# Patient Record
Sex: Female | Born: 1935 | Race: White | Hispanic: No | Smoking: Former smoker
Health system: Southern US, Community
[De-identification: ages and names within clinical notes are randomized; demographics above are authoritative.]

## PROBLEM LIST (undated history)

## (undated) DIAGNOSIS — J309 Allergic rhinitis, unspecified: Secondary | ICD-10-CM

## (undated) DIAGNOSIS — R232 Flushing: Secondary | ICD-10-CM

## (undated) DIAGNOSIS — F411 Generalized anxiety disorder: Secondary | ICD-10-CM

## (undated) DIAGNOSIS — L259 Unspecified contact dermatitis, unspecified cause: Secondary | ICD-10-CM

## (undated) DIAGNOSIS — I10 Essential (primary) hypertension: Secondary | ICD-10-CM

## (undated) DIAGNOSIS — K644 Residual hemorrhoidal skin tags: Secondary | ICD-10-CM

## (undated) DIAGNOSIS — G20 Parkinson's disease: Secondary | ICD-10-CM

## (undated) DIAGNOSIS — G20A1 Parkinson's disease without dyskinesia, without mention of fluctuations: Secondary | ICD-10-CM

## (undated) DIAGNOSIS — R002 Palpitations: Secondary | ICD-10-CM

## (undated) DIAGNOSIS — E785 Hyperlipidemia, unspecified: Secondary | ICD-10-CM

## (undated) DIAGNOSIS — F329 Major depressive disorder, single episode, unspecified: Secondary | ICD-10-CM

## (undated) DIAGNOSIS — K589 Irritable bowel syndrome without diarrhea: Secondary | ICD-10-CM

## (undated) HISTORY — DX: Residual hemorrhoidal skin tags: K64.4

## (undated) HISTORY — DX: Parkinson's disease: G20

## (undated) HISTORY — DX: Parkinson's disease without dyskinesia, without mention of fluctuations: G20.A1

## (undated) HISTORY — DX: Major depressive disorder, single episode, unspecified: F32.9

## (undated) HISTORY — DX: Generalized anxiety disorder: F41.1

## (undated) HISTORY — PX: CATARACT EXTRACTION W/ INTRAOCULAR LENS IMPLANT: SHX1309

## (undated) HISTORY — PX: OTHER SURGICAL HISTORY: SHX169

## (undated) HISTORY — DX: Palpitations: R00.2

## (undated) HISTORY — DX: Irritable bowel syndrome, unspecified: K58.9

## (undated) HISTORY — DX: Hyperlipidemia, unspecified: E78.5

## (undated) HISTORY — DX: Allergic rhinitis, unspecified: J30.9

## (undated) HISTORY — DX: Unspecified contact dermatitis, unspecified cause: L25.9

## (undated) HISTORY — DX: Flushing: R23.2

## (undated) HISTORY — PX: HEMORRHOID SURGERY: SHX153

## (undated) HISTORY — PX: MANDIBLE SURGERY: SHX707

---

## 1998-09-28 ENCOUNTER — Other Ambulatory Visit: Admission: RE | Admit: 1998-09-28 | Discharge: 1998-09-28 | Payer: Self-pay | Admitting: Obstetrics and Gynecology

## 1999-10-04 ENCOUNTER — Other Ambulatory Visit: Admission: RE | Admit: 1999-10-04 | Discharge: 1999-10-04 | Payer: Self-pay | Admitting: Obstetrics and Gynecology

## 2000-08-04 ENCOUNTER — Observation Stay (HOSPITAL_COMMUNITY): Admission: RE | Admit: 2000-08-04 | Discharge: 2000-08-06 | Payer: Self-pay | Admitting: Oral Surgery

## 2000-08-12 ENCOUNTER — Emergency Department (HOSPITAL_COMMUNITY): Admission: EM | Admit: 2000-08-12 | Discharge: 2000-08-12 | Payer: Self-pay | Admitting: Emergency Medicine

## 2000-08-12 ENCOUNTER — Encounter: Payer: Self-pay | Admitting: Emergency Medicine

## 2000-12-07 ENCOUNTER — Other Ambulatory Visit: Admission: RE | Admit: 2000-12-07 | Discharge: 2000-12-07 | Payer: Self-pay | Admitting: Obstetrics and Gynecology

## 2001-12-20 ENCOUNTER — Other Ambulatory Visit: Admission: RE | Admit: 2001-12-20 | Discharge: 2001-12-20 | Payer: Self-pay | Admitting: Obstetrics and Gynecology

## 2005-05-18 ENCOUNTER — Ambulatory Visit: Payer: Self-pay | Admitting: Internal Medicine

## 2005-05-24 ENCOUNTER — Ambulatory Visit: Payer: Self-pay | Admitting: Internal Medicine

## 2005-07-11 ENCOUNTER — Ambulatory Visit: Payer: Self-pay | Admitting: Internal Medicine

## 2005-07-25 ENCOUNTER — Ambulatory Visit: Payer: Self-pay | Admitting: Internal Medicine

## 2005-08-17 ENCOUNTER — Ambulatory Visit: Payer: Self-pay | Admitting: Endocrinology

## 2006-02-28 ENCOUNTER — Other Ambulatory Visit: Admission: RE | Admit: 2006-02-28 | Discharge: 2006-02-28 | Payer: Self-pay | Admitting: Obstetrics and Gynecology

## 2006-05-09 ENCOUNTER — Ambulatory Visit: Payer: Self-pay | Admitting: Internal Medicine

## 2008-02-07 DIAGNOSIS — J309 Allergic rhinitis, unspecified: Secondary | ICD-10-CM

## 2008-02-07 DIAGNOSIS — E785 Hyperlipidemia, unspecified: Secondary | ICD-10-CM

## 2008-02-07 HISTORY — DX: Allergic rhinitis, unspecified: J30.9

## 2008-02-07 HISTORY — DX: Hyperlipidemia, unspecified: E78.5

## 2008-04-28 ENCOUNTER — Ambulatory Visit: Payer: Self-pay | Admitting: Internal Medicine

## 2008-07-02 ENCOUNTER — Ambulatory Visit: Payer: Self-pay | Admitting: Endocrinology

## 2009-06-02 ENCOUNTER — Telehealth (INDEPENDENT_AMBULATORY_CARE_PROVIDER_SITE_OTHER): Payer: Self-pay | Admitting: *Deleted

## 2009-07-14 ENCOUNTER — Ambulatory Visit: Payer: Self-pay | Admitting: Internal Medicine

## 2009-07-28 ENCOUNTER — Ambulatory Visit: Payer: Self-pay | Admitting: Internal Medicine

## 2009-07-28 LAB — HM COLONOSCOPY

## 2010-05-11 LAB — CONVERTED CEMR LAB: Pap Smear: NORMAL

## 2010-05-25 ENCOUNTER — Ambulatory Visit: Payer: Self-pay | Admitting: Internal Medicine

## 2010-05-25 LAB — CONVERTED CEMR LAB
ALT: 14 units/L (ref 0–35)
AST: 18 units/L (ref 0–37)
Albumin: 4.2 g/dL (ref 3.5–5.2)
Alkaline Phosphatase: 55 units/L (ref 39–117)
BUN: 14 mg/dL (ref 6–23)
Basophils Absolute: 0.1 10*3/uL (ref 0.0–0.1)
Basophils Relative: 0.9 % (ref 0.0–3.0)
Bilirubin, Direct: 0.1 mg/dL (ref 0.0–0.3)
CO2: 28 meq/L (ref 19–32)
Calcium: 9.4 mg/dL (ref 8.4–10.5)
Chloride: 109 meq/L (ref 96–112)
Cholesterol: 291 mg/dL — ABNORMAL HIGH (ref 0–200)
Creatinine, Ser: 0.8 mg/dL (ref 0.4–1.2)
Direct LDL: 210.1 mg/dL
Eosinophils Absolute: 0.1 10*3/uL (ref 0.0–0.7)
Eosinophils Relative: 1.1 % (ref 0.0–5.0)
GFR calc non Af Amer: 72.76 mL/min (ref >60)
Glucose, Bld: 96 mg/dL (ref 70–99)
HCT: 41.1 % (ref 36.0–46.0)
HDL: 66.6 mg/dL (ref >39.00)
Hemoglobin: 14.3 g/dL (ref 12.0–15.0)
Lymphocytes Relative: 25.1 % (ref 12.0–46.0)
Lymphs Abs: 1.7 10*3/uL (ref 0.7–4.0)
MCHC: 34.9 g/dL (ref 30.0–36.0)
MCV: 90.2 fL (ref 78.0–100.0)
Monocytes Absolute: 0.5 10*3/uL (ref 0.1–1.0)
Monocytes Relative: 7.5 % (ref 3.0–12.0)
Neutro Abs: 4.4 10*3/uL (ref 1.4–7.7)
Neutrophils Relative %: 65.4 % (ref 43.0–77.0)
Platelets: 226 10*3/uL (ref 150.0–400.0)
Potassium: 4.7 meq/L (ref 3.5–5.1)
RBC: 4.55 M/uL (ref 3.87–5.11)
RDW: 13.2 % (ref 11.5–14.6)
Sodium: 143 meq/L (ref 135–145)
TSH: 1.89 microintl units/mL (ref 0.35–5.50)
Total Bilirubin: 1 mg/dL (ref 0.3–1.2)
Total CHOL/HDL Ratio: 4
Total Protein: 6.6 g/dL (ref 6.0–8.3)
Triglycerides: 107 mg/dL (ref 0.0–149.0)
VLDL: 21.4 mg/dL (ref 0.0–40.0)
WBC: 6.7 10*3/uL (ref 4.5–10.5)

## 2010-05-28 ENCOUNTER — Encounter: Payer: Self-pay | Admitting: Internal Medicine

## 2010-06-30 ENCOUNTER — Telehealth: Payer: Self-pay | Admitting: Internal Medicine

## 2010-08-12 ENCOUNTER — Ambulatory Visit: Payer: Self-pay | Admitting: Internal Medicine

## 2010-08-17 ENCOUNTER — Telehealth: Payer: Self-pay | Admitting: Internal Medicine

## 2010-09-21 ENCOUNTER — Ambulatory Visit: Payer: Self-pay | Admitting: Internal Medicine

## 2010-09-21 LAB — CONVERTED CEMR LAB
ALT: 13 units/L (ref 0–35)
AST: 17 units/L (ref 0–37)
Albumin: 3.8 g/dL (ref 3.5–5.2)
Alkaline Phosphatase: 66 units/L (ref 39–117)
Bilirubin, Direct: 0.1 mg/dL (ref 0.0–0.3)
Cholesterol: 173 mg/dL (ref 0–200)
HDL: 51.5 mg/dL (ref >39.00)
LDL Cholesterol: 94 mg/dL (ref 0–99)
Total Bilirubin: 0.7 mg/dL (ref 0.3–1.2)
Total CHOL/HDL Ratio: 3
Total Protein: 6.6 g/dL (ref 6.0–8.3)
Triglycerides: 137 mg/dL (ref 0.0–149.0)
VLDL: 27.4 mg/dL (ref 0.0–40.0)

## 2010-10-06 ENCOUNTER — Encounter: Payer: Self-pay | Admitting: Internal Medicine

## 2011-01-13 NOTE — Letter (Signed)
[facility] [address] [place], [place]  [address] Phone: [phone]      October 07, 2010   [name] [address] [place], [place] [address]  RE:  LAB RESULTS  Dear  [name],  The following is an interpretation of your most recent lab tests.  Please take note of any instructions provided or changes to medications that have resulted from your lab work.  LIVER FUNCTION TESTS:  Good - no changes needed  LIPID PANEL:  Good - no changes needed Triglyceride: 137.0   Cholesterol: 173   LDL: 94   HDL: 51.50   Chol/HDL%:  3   Fabulous response to low dose statin therapy. Good job.   Please come see me if you have any questions about these lab results.   Sincerely Yours,    Jacques Navy MD  atient: [name] Note: All result statuses are Final unless otherwise noted.  Tests: (1) Lipid Panel (LIPID)   Cholesterol               173 mg/dL                   3-875     ATP III Classification            Desirable:  < 200 mg/dL                    Borderline High:  200 - 239 mg/dL               High:  > = 240 mg/dL   Triglycerides             137.0 mg/dL                 6.4-332.9     Normal:  <150 mg/dL     Borderline High:  518 - 199 mg/dL   HDL                       84.16 mg/dL                 >60.63   VLDL Cholesterol          27.4 mg/dL                  0.1-60.1   LDL Cholesterol           94 mg/dL                    0-93  CHO/HDL Ratio:  CHD Risk                             3                    Men          Women     1/2 Average Risk     3.4          3.3     Average Risk          5.0          4.4     2X Average Risk          9.6          7.1     3X Average Risk          15.0  11.0                           Tests: (2) Hepatic/Liver Function Panel (HEPATIC)   Total Bilirubin           0.7 mg/dL                   0.7-3.7   Direct Bilirubin          0.1 mg/dL                   1.0-6.2   Alkaline Phosphatase      66 U/L                       39-117   AST                       17 U/L                      0-37   ALT                       13 U/L                      0-35   Total Protein             6.6 g/dL                    6.9-4.8   Albumin                   3.8 g/dL                    5.4-6.2

## 2011-01-13 NOTE — Progress Notes (Signed)
  Phone Note Refill Request Message from:  Fax from Pharmacy on June 30, 2010 9:55 AM  Refills Requested: Medication #1:  RHINOCORT AQUA 32 MCG/ACT  SUSP Take as directed. Recie[facility] [address]. Pt needs a cheaper alternative. Please Advise  Initial call taken by: Ami Bullins CMA,  June 30, 2010 9:55 AM  Follow-up for Phone Call        change to fluticasone nasal spray- 1 spray per nostril once a day. #1 bottle, refill as needed. Follow-up by: Jacques Navy MD,  June 30, 2010 12:20 PM    New/Updated Medications: FLUTICASONE PROPIONATE 50 MCG/ACT SUSP (FLUTICASONE PROPIONATE) 1 spray per nostril once a day Prescriptions: FLUTICASONE PROPIONATE 50 MCG/ACT SUSP (FLUTICASONE PROPIONATE) 1 spray per nostril once a day  #1 x 1   Entered by:   Ami Bullins CMA   Authorized by:   Jacques Navy MD   Signed by:   Bill Salinas CMA on 06/30/2010   Method used:   [facility]        [facility]  [phone]* (retail)       [address] [place], [place]  [address]       Ph: [phone] or [phone]       Fax: [phone]   RxID:   [id number]

## 2011-01-13 NOTE — Progress Notes (Signed)
Summary: LIPITOR REACTION?  Phone Note Call from Patient   Summary of Call: Paitient had itching after 2 days of lipitor. Pt woke up and c/o red itchy area on left arm and wrist. She stopped med, took claritin and benadryl. Symptoms resolved by Sunday. She wants to know what her alternatives are and will no longer take this medication.  Initial call taken by: Sarah Douglas, CMA,  August 17, 2010 12:31 PM  Follow-up for Phone Call        ok to change to simvastatin 20 mg per day Follow-up by: James W John MD,  August 17, 2010 1:05 PM  Additional Follow-up for Phone Call Additional follow up Details #1::        Pt informed Additional Follow-up by: Dahlia Bonyun-DeBourgh, CMA,  August 17, 2010 1:37 PM   New Allergies: ! LIPITOR New/Updated Medications: SIMVASTATIN 20 MG TABS (SIMVASTATIN) 1po once daily New Allergies: ! LIPITORPrescriptions: SIMVASTATIN 20 MG TABS (SIMVASTATIN) 1po once daily  #90 x 3   Entered and Authorized by:   James W John MD   Signed by:   James W John MD on 08/17/2010   Method used:   [facility]        [facility]  [address]  [id number] ([facility]ail)       [address]       [place], [place]  [address]       Ph: [phone] or [phone]       Fax: [phone]   RxID:   [id number]

## 2011-01-13 NOTE — Assessment & Plan Note (Signed)
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  History of Present Illness: Patient presents for routine medical exam. she has recently GYN exam and this was normal. She had a mammogram recently. She had colonoscopy '10 with Dr. Leone Payor - normal study. Independent in all ADLs with no restrictions or limitations. No history of falls and no balance trouble. No problems with depression or mood disorder. 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   Current Medications (verified): 1)  Prometrium 100 Mg  Caps (Progesterone Micronized) .... Take One Capusle Daily 2)  Estrace 1 Mg  Tabs (Estradiol) .... Take One Tablet Daily 3)  Citrucel 500 Mg  Tabs (Methylcellulose (Laxative)) .... Take One Tablet Daily 4)  Rhinocort Aqua 32 Mcg/act  Susp (Budesonide) .... Take As Directed. 5)  Triamcinolone Acetonide 0.5 %  Crea (Triamcinolone Acetonide) .... Use Asd Two Times A Day Prn 6)  Epipen 0.3 Mg/0.85ml Devi (Epinephrine) .... As Needed For Bee Stings  Allergies (verified): 1)  ! Asa 2)  ! Tylenol 3)  ! Ibuprofen 4)  ! * Vagisec Cream 5)  ! Keflex 6)  ! * Hymenoptra  Past History:  Past Medical History: Last updated: 02/07/2008 Hx of HEMORRHOIDS, EXTERNAL (ICD-455.3) SEBORRHEIC KERATOSES, HX OF (ICD-V13.3) HYPERLIPIDEMIA (ICD-272.4) FIBROCYSTIC BREAST DISEASE, HX OF  (ICD-V13.9) Hx of HORMONE REPLACEMENT THERAPY 10 YEARS (ICD-V07.4) ALLERGIC RHINITIS (ICD-477.9) IRRITABLE BOWEL SYNDROME (ICD-564.1)  Past Surgical History: Last updated: 02/07/2008 * BENIGN MOLES REMOVED Hx of SEBACEOUS CYST EXCISED (ICD-706.2) HEMORRHOIDECTOMY, HX OF (ICD-V45.89)    Family History: Last updated: 04/28/2008 heart disease - father, grandfather and uncle and aunt mother lived to 18 yo 6 sisters - 2 with breast cancer, and hypothyroid  Social History: Last updated: 04/28/2008 Married 4 daughters Former Smoker Alcohol use-yes retired - Doctor, 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  The patient denies anorexia, fever, weight loss, weight gain, decreased hearing, chest pain, syncope, dyspnea on exertion, peripheral edema, prolonged cough, abdominal pain, severe indigestion/heartburn, and incontinence.         visual change due to cataract- significant loss of vision OS. Possible lactose intolerance. Hymenoptra   Physical Exam  General:  WNWD white female in no distress. Head:  Normocephalic and atraumatic without obvious abnormalities. No apparent alopecia or balding. Eyes:  vision grossly intact, pupils equal, pupils round, corneas and lenses clear, and no injection.   Ears:  External ear exam shows no significant lesions or deformities.  Otoscopic examination reveals clear canals, tympanic membranes are intact bilaterally  without bulging, retraction, inflammation or discharge. Hearing is grossly normal bilaterally. Nose:  no external deformity and no external erythema.   Mouth:  Oral mucosa and oropharynx without lesions or exudates.  Teeth in good repair. Neck:  supple, no thyromegaly, and no carotid bruits.   Chest Wall:  no deformities.   Breasts:  deferred to gyn Lungs:  Normal respiratory effort, chest expands symmetrically. Lungs are clear to auscultation, no crackles or wheezes. Heart:  Normal rate and  regular rhythm. S1 and S2 normal without gallop, murmur, click, rub or other extra sounds. Abdomen:  soft, non-tender, normal bowel sounds, no guarding, and no hepatomegaly.   Genitalia:  deferred to gyn Msk:  normal ROM, no joint tenderness, no joint swelling, no joint warmth, no joint deformities, and no crepitation.   Pulses:  2+ radial and DP pulses Extremities:  No clubbing, cyanosis, edema, or deformity noted with normal full range of motion of all joints.   Neurologic:  alert & oriented X3, cranial nerves II-XII intact, strength normal in all extremities, gait normal, and DTRs symmetrical and normal.   Skin:  turgor normal, color normal, and no rashes.   Cervical Nodes:  no anterior cervical adenopathy and no posterior cervical adenopathy.   Psych:  Oriented X3, memory intact for recent and remote, normally interactive, good eye contact, and not anxious appearing.     Impression & Recommendations:  Problem # 1:  HYPERLIPIDEMIA (ICD-272.4) Due for lab with recommendations to follow.  Orders: TLB-Lipid Panel (80061-LIPID) TLB-Hepatic/Liver Function Pnl (80076-HEPATIC) TLB-TSH (Thyroid Stimulating Hormone) (84443-TSH)  Addendum - cholesterol very high with LDL 201!  Plan - will recommend medical therapy - OV to discuss options.   Problem # 2:  ALLERGIC RHINITIS (ICD-477.9) Stable. She gets good results with rhinocort. Rx provided.  Her updated medication list for this problem includes:    Rhinocort Aqua 32 Mcg/act Susp (Budesonide) .[name]... Take as directed.  Problem # 3:  IRRITABLE BOWEL SYNDROME (ICD-564.1) Patient does not complain of significant symptoms. She follows a healthy diet with plenty of fiber.  Plan - continue diet management.  Orders: TLB-BMP (Basic Metabolic Panel-BMET) (80048-METABOL)  Problem # 4:  Preventive Health Care (ICD-V70.0) Unremarkable history. Normal physical exam. Lab results are normal except for cholesterol panel. She is current with gyn and  exams. Last mammogram '11. Last colonoscopy Aug '10. Her tetnus is current, '08. She is a candidate for pnemonia and shingles vaccines.  Patient does exercise but could do more. She has no h/o falls or fall risk. 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  In summary - a very nice woman who is medically stable except for hyperlipidemia. She will be asked to return to discuss medical therapy and to have the opportunity for immuniztions.   Complete Medication List: 1)  Prometrium 100 Mg Caps (Progesterone micronized) .... Take one capusle daily 2)  Estrace 1 Mg Tabs (Estradiol) .... Take one tablet daily 3)  Citrucel 500 Mg Tabs (Methylcellulose (laxative)) .... Take one tablet daily 4)  Rhinocort Aqua 32 Mcg/act Susp (Budesonide) .... Take as directed. 5)  Triamcinolone Acetonide 0.5 % Crea (Triamcinolone acetonide) .... Use asd two times a day prn 6)  Epipen 0.3 Mg/0.29ml Devi (Epinephrine) .... As needed for bee stings  Other Orders: Subsequent annual wellness visit with prevention plan (Z6109) TLB-CBC Platelet - w/Differential (85025-CBCD)  ES Runde Note: All result statuses are Final unless otherwise noted.  Tests: (1) Lipid  Panel (LIPID)   Cholesterol          [H]  291 mg/dL                   1-610     ATP III Classification            Desirable:  < 200 mg/dL                    Borderline High:  200 - 239 mg/dL               High:  > = 240 mg/dL   Triglycerides             107.0 mg/dL                 9.6-045.4     Normal:  <150 mg/dL     Borderline High:  098 - 199 mg/dL   HDL                       11.91 mg/dL                 >47.82   VLDL Cholesterol          21.4 mg/dL                  9.5-62.1  CHO/HDL Ratio:  CHD Risk                             4                    Men          Women     1/2 Average Risk     3.4          3.3     Average Risk          5.0          4.4     2X Average Risk          9.6           7.1     3X Average Risk          15.0          11.0                           Tests: (2) Hepatic/Liver Function Panel (HEPATIC)   Total Bilirubin           1.0 mg/dL                   3.0-8.6   Direct Bilirubin          0.1 mg/dL                   5.7-8.4   Alkaline Phosphatase      55 U/L                      39-117   AST                       18 U/L                      0-37   ALT  14 U/L                      0-35   Total Protein             6.6 g/dL                    8.6-5.7   Albumin                   4.2 g/dL                    8.4-6.9  Tests: (3) CBC Platelet w/Diff (CBCD)   White Cell Count          6.7 K/uL                    4.5-10.5   Red Cell Count            4.55 Mil/uL                 3.87-5.11   Hemoglobin                14.3 g/dL                   62.9-52.8   Hematocrit                41.1 %                      36.0-46.0   MCV                       90.2 fl                     78.0-100.0   MCHC                      34.9 g/dL                   41.3-24.4   RDW                       13.2 %                      11.5-14.6   Platelet Count            226.0 K/uL                  150.0-400.0   Neutrophil %              65.4 %                      43.0-77.0   Lymphocyte %              25.1 %                      12.0-46.0   Monocyte %                7.5 %                       3.0-12.0   Eosinophils%              1.1 %  0.0-5.0   Basophils %               0.9 %                       0.0-3.0   Neutrophill Absolute      4.4 K/uL                    1.4-7.7   Lymphocyte Absolute       1.7 K/uL                    0.7-4.0   Monocyte Absolute         0.5 K/uL                    0.1-1.0  Eosinophils, Absolute                             0.1 K/uL                    0.0-0.7   Basophils Absolute        0.1 K/uL                    0.0-0.1  Tests: (4) BMP (METABOL)   Sodium                    143 mEq/L                   135-145    Potassium                 4.7 mEq/L                   3.5-5.1   Chloride                  109 mEq/L                   96-112   Carbon Dioxide            28 mEq/L                    19-32   Glucose                   96 mg/dL                    16-10   BUN                       14 mg/dL                    9-60   Creatinine                0.8 mg/dL                   4.5-4.0   Calcium                   9.4 mg/dL                   9.8-11.9   GFR                       72.76 mL/min                >  60  Tests: (5) TSH (TSH)   FastTSH                   1.89 uIU/mL                 0.35-5.50  Tests: (6) Cholesterol LDL - Direct (DIRLDL)  Cholesterol LDL - Direct                             210.1 mg/dL     Optimal:  <161 mg/dL     Near or Above Optimal:  100-129 mg/dL     Borderline High:  096-045 mg/dL     High:  409-811 mg/dL     Very High:  >914 mg/dLPrescriptions: RHINOCORT AQUA 32 MCG/ACT  SUSP (BUDESONIDE) Take as directed.  #1 x 12   Entered and Authorized by:   Jacques Navy MD   Signed by:   Jacques Navy MD on 05/25/2010   Method used:   [facility]        [facility]  [phone]* (retail)       [address] [place], [place]  [address]       Ph: [phone] or [phone]       Fax: [phone]   RxID:   [id number]    Preventive Care Screening  Mammogram:    Date:  05/11/2010    Results:  normal bilateral   Pap Smear:    Date:  05/11/2010    Results:  normal   Last Flu Shot:    Date:  09/12/2009    Results:  given   Last Tetanus Booster:    Date:  05/14/2007    Results:  Historical

## 2011-01-13 NOTE — Assessment & Plan Note (Signed)
Summary: f/u appt/#/cd   Vital Signs:  Patient profile:   75 year old female Height:      63 inches Weight:      149 pounds BMI:     26.49 O2 Sat:      98 % on Room air Temp:     97.6 degrees F oral Pulse rate:   73 / minute BP sitting:   132 / 78  (left arm) Cuff size:   regular  Vitals Entered By: Bill Salinas CMA (August 12, 2010 10:26 AM)  O2 Flow:  Room air CC: ov for follow up on elevated cholesterol/ ab   Primary Care Provider:  Norins  CC:  ov for follow up on elevated cholesterol/ ab.  History of Present Illness: Patient presents to review recent abnormal lab-elevated lipid panel. She has had no problems since her recent CPX  Current Medications (verified): 1)  Prometrium 100 Mg  Caps (Progesterone Micronized) .... Take One Capusle Daily 2)  Estrace 1 Mg  Tabs (Estradiol) .... Take One Tablet Daily 3)  Fluticasone Propionate 50 Mcg/act Susp (Fluticasone Propionate) .[name].. 1 Spray Per Nostril Once A Day 4)  Epipen 0.3 Mg/0.73ml Devi (Epinephrine) .... As Needed For Bee Stings  Allergies (verified): 1)  ! Asa 2)  ! Tylenol 3)  ! Ibuprofen 4)  ! * Vagisec Cream 5)  ! Keflex 6)  ! * Hymenoptra PMH-FH-SH reviewed-no changes except otherwise noted  Review of Systems  The patient denies anorexia, fever, chest pain, dyspnea on exertion, abdominal pain, unusual weight change, and enlarged lymph nodes.    Physical Exam  General:  Well-developed,well-nourished,in no acute distress; alert,appropriate and cooperative throughout examination Lungs:  normal respiratory effort.   Heart:  normal rate and regular rhythm.   Neurologic:  alert & oriented X3 and gait normal.   Skin:  turgor normal and color normal.   Psych:  Oriented X3, memory intact for recent and remote, good eye contact, and not anxious appearing.     Impression & Recommendations:  Problem # 1:  HYPERLIPIDEMIA (ICD-272.4) Patient with abnormal lipid panel with LDL  at CPX in July of 201. This is much  higher than last lab. Discussed implications and need for control including  diet managment and medical management. discussed the mechanism of action of Statin drugs.  Plan - lipitor 10 mg once daily with labs in 3-4 weeks.  Her updated medication list for this problem includes:    Lipitor 10 Mg Tabs (Atorvastatin calcium) .[name]... 1 by mouth qpm for cholesterol  ( greater than 50% of 20 min visit devoted to education and counselling)  Complete Medication List: 1)  Prometrium 100 Mg Caps (Progesterone micronized) .... Take one capusle daily 2)  Estrace 1 Mg Tabs (Estradiol) .... Take one tablet daily 3)  Fluticasone Propionate 50 Mcg/act Susp (Fluticasone propionate) .[name].. 1 spray per nostril once a day 4)  Epipen 0.3 Mg/0.67ml Devi (Epinephrine) .... As needed for bee stings 5)  Lipitor 10 Mg Tabs (Atorvastatin calcium) .[name].. 1 by mouth qpm for cholesterol Prescriptions: LIPITOR 10 MG TABS (ATORVASTATIN CALCIUM) 1 by mouth qPM for cholesterol  #30 x 12   Entered and Authorized by:   Jacques Navy MD   Signed by:   Jacques Navy MD on 08/12/2010   Method used:   [facility]  [address]  [phone]* (retail)       [address] [place]  [place], [place]  [address]       Ph: [phone] or [phone]       Fax: [phone]   RxID:   [id number]

## 2011-04-29 NOTE — Op Note (Signed)
[name]  Patient:    [name], [name]                MRN: [id number] Proc. Date: 08/04/00 Adm. Date:  [id number] Disc. Date: [id number] Attending:  Georgia Lopes CC:         Rosalyn Gess. Norins, M.D. [facility]   Operative Report  PREOPERATIVE DIAGNOSIS:  Maxillary and mandibular skeletal dysplasia, maxillary vertical hyperplasia, mandibular retrognathism.  POSTOPERATIVE DIAGNOSIS:  Maxillary and mandibular skeletal dysplasia, maxillary vertical hyperplasia, mandibular retrognathism.  PROCEDURE:  Maxillary Jerry Caras I osteotomy with impaction and rigid fixation, mandibular bilateral sagittal split osteotomy with advancement and rigid fixation, genioplasty advancement, and insertion of custom oral surgical splint.  SURGEON:  Georgia Lopes, D.M.D.  ANESTHESIOLOGIST:  Lucille Passy, M.D.  ANESTHESIA:  General nasal.  DESCRIPTION OF PROCEDURE:  The patient was taken to the room and placed on the table in the supine position.  General anesthesia was administered intravenously, and a nasal endotracheal tube was placed and secured.  The eyes were lubricated and protected.  A Foley catheter was placed.  A nasogastric tube was placed.  The table was turned.  The patient was prepped and draped, and throat packing was placed.  Then 24-gauge wire Ivy loops were placed between the first and second molars in all four quadrants and between the pre molars and canines in all four quadrants.  A total of eight Ivy loops were placed.  Then 0.5% Marcaine with 1:200,000 epinephrine was infiltrated in the maxillary buccal vestibule.  A total of four copules were used.  Then a #15 blade was used to perform an incision of the right maxilla in the buccal vestibule.  Dissection was carried from the midline to approximately the area of the first molar into loose tissue.  Dissection was carried down to the bone.  Dissection was performed superiorly to the temporal  orbital nerve, laterally around to the ______ plate region and medially to the piriform rim. Then a curved Therapist, nutritional was used to elevate the nasal mucosa from the floor of the nose and the superior border of the maxilla.  Then a Codman sponge was placed in this region, and attention was turned to the left maxilla.  A #15 blade was used to make this incision, and dissection was carried to the lateral ______ region, piriform rim, and the infraorbital nerve were identified.  Then the nasal floor was elevated and another Codman sponge was placed in this nasal floor on the left side.  Obwegeser retractors were used for visualization.  Then a caliper was used to make reference marks at the piriform rim on the right and left side and at the ______ buttress region on the right and left sides so that the maxilla could be moved to a predetermined position.  The reference marks were made with a 7-in-1 bur under copious irrigation.  Then a reciprocating saw was used under irrigation to make an incision horizontally from the maxilla, beginning in the left posterior maxilla laterally and finishing at the lateral nasal wall.  A Freer elevator was placed in the lateral nasal region to protect the nasal mucosa. This was done under irrigation.  Then the right maxilla was cut with a saw and a Glorious Peach was placed in the right nasal floor to protect the nasal mucosa.  The Codman sponges were removed prior to using the saw.  Then a nasal osteotome was used to cleave the nasal bone and septum from the maxillary nasal  spine. Then Safe osteotomes were used to cleave the lateral nasal wall using this osteotome and a mallet, and then a curved 6-mm osteotome was used to cleave the ______ plates.  These osteotomes were placed first on the right side and were positioned in an inferior and angled anterior with one finger inside the mouth to palpate for the osteotome.  The left side was then osteotomized, and then the  anesthesia was notified approximately one-half hour prior to down fracture.  At the time down fracture was about to be done, the blood pressure was still a bit high, so attention was turned to the chin area, and a genioplasty bony cut was performed.  This was done using 0.5% Marcaine, two copules.  A #15 blade was used to make a circular incision.  Dissection was carried down to the bone.  Both mental nerves were identified and protected. Reference marks were made in the bone, and then a  reciprocating saw was used to make a full thickness cut through the bone taking care to avoid the apices of the teeth.  After the bone cut was made, the bony segment was freed up and found to move freely in the anterior direction.  Then cottonoid sponges were placed, and anesthesia was alerted.  The blood pressure was now low enough to down fracture the maxilla.  Attention was turned to the maxilla for down fracture.  Using bimanual pressure, the maxilla was relatively easily down fractured.  Suction was used to suction the contents of the sinus, and the maxilla was freed anteriorly rather easily.  A spatula osteotome was used to free up the posterior wall of the maxilla.  A pituitary rongeur and a back-action rongeur were used to move the bony interferences in the posterior wall of the maxilla.  The posterior superior palatine arteries were intact and remained so throughout the case.  The bone was trimmed with these rongeurs and with a dental egg-shaped bur until the maxilla was trimmed appropriately. Then the intermediate splint was placed in the mouth and ligated from the eight Ivy loops into its predetermined occlusion.  The maxilla was placed into its final position and found to fit nicely.  Then bone plates were placed. The nasal mucosa was, by the way, not torn during the down fracture procedure so no suturing was necessary.  L-shaped bone plates were used at the piriform rim on the right and left  side and at the buttress region on the right and left side.  A 2.0 plate system was used, OsteoMed, and 2.0 screws with an  occasional 2.5 bailout screw when the bone was rather thin and the screw stripped.  After the maxilla was secured, the area was irrigated and then closed with 3-0 chromic continuous interlocking sutures.  Then attention was turned to the sagittal split osteotomy.  The left side was operated first. Marcaine, two copules, were infiltrated in the mandibular sulcus.  A #15 blade was used to make approximately a 3-cm incision.  Dissection was carried down to the bone in this region, and dissection was carried superiorly along the mandibular ramus and laterally over the lateral border of the mandible in the first molar region down to the inferior border of the mandible laterally. Then an [facility] retractor was placed in the ramus region and retracted superiorly.  This was replaced with a curved Kelly clamp with a chain for retraction.  Dissection was then carried out medially along the border of the ramus.  There was immediately  observed an inferior alveolar nerve entering the mandible extremely anteriorly.  It was felt this may be an accessory nerve, but care was taken to avoid damaging this nerve.  A nerve hook was used to identify the sigmoid notch superiorly, and then a reciprocating saw was used to make a horizontal cut below this halfway through the medial lateral thickness of the ramus.  A 7-in-1 bur was used to make reference marks coming down the ascending ramus, taking care to come as close as possible to the medial border of the lateral cortex so that the bony cut would hug the outer cortex of the bone.  These bony cuts came down the ascending ramus down onto the lateral border of the mandible and were carried down to the area of the first molar and were made to adjoin a vertical bone cut that was made with the reciprocating saw at the area of the first molar.  A  channel retractor and J stripper were used to free up the muscle and periosteum at the inferior border of the mandible in this region.  Then special osteotome and various wooden-handled osteotomes were used to section the mandible in this region, and a mallet was used also.  The bone separated, and a favorable split was achieved.  The inferior alveolar nerve was seen in the distal segment.  The bone was freed up further and found to have good mobility.  This area was then packed with cottonoid, and then attention was turned to the right side. Similar dissection was used.  The nerve on this side was found to be a normal position, more proximal in location.  The nerve hook was used to find the nerve canal, and the reciprocating saw was used to make the cut horizontally above the nerve canal.  Then the 7-in-1 was used to make the bony cuts going down the ascending border of the ramus, going down to the area of the first molar, and the vertical cuts in the first molar region were done with the 7-in-1 bur.  A channel retractor was used, and the bony cuts were adjoined, and then the various osteotomes were used to perform the sagittal split.  The split was favorable on this side, and the nerve was identified in the distal segment.  This bone was freed up, and then the mandible was placed into the final splint, and the upper and lower jaws were placed in an intermaxillary fixation using the previously placed Ivy loops and 24-gauge wire.  Then the right side was rigidly fixed using a Engineer, maintenance (IT) to push the mandible into a seated position so the condyle was seated into the fossa, and there was good bony alignment along the inferior and superior border of the mandible.  It appeared that there was approximately a 6-mm advancement as was previously determined to be the amount of advancement.  Then a drill was used to drill bicortically, and 14- and 16-mm bicortical screws were placed to secure  the distal and proximal segments together.  This was done on the right and left sides.  Then the intermaxillary fixation was cut, and the jaw was opened and closed and found to go into good occlusion.  The splint was removed.  Then attention was turned to the genioplasty.  The chin was moved forward approximately 4 mm, and a 4-mm bone plate was placed and secured with 2.0 screws.  Then the interval cavity was irrigated copiously, and all incisions were closed with 3-0 chromic.  Then  the oral cavity was irrigated, suctioned, and the throat pack was removed.  The Ivy loops were then cut and removed. The patient was awakened.  Ace wrap was placed prior to awakening the patient. Then she was extubated and taken to the recovery room, breathing spontaneously.  ESTIMATED BLOOD LOSS:  400 cc.  COMPLICATIONS:  None.  SPECIMENS:  None.  Sponge counts, instrument and needle counts were correct. DD:  08/04/00 TD:  08/06/00 Job: [id number]

## 2011-05-23 ENCOUNTER — Encounter: Payer: Self-pay | Admitting: Internal Medicine

## 2011-05-24 ENCOUNTER — Other Ambulatory Visit (INDEPENDENT_AMBULATORY_CARE_PROVIDER_SITE_OTHER): Payer: Medicare Other

## 2011-05-24 ENCOUNTER — Encounter: Payer: Self-pay | Admitting: Internal Medicine

## 2011-05-24 ENCOUNTER — Telehealth: Payer: Self-pay | Admitting: *Deleted

## 2011-05-24 ENCOUNTER — Ambulatory Visit (INDEPENDENT_AMBULATORY_CARE_PROVIDER_SITE_OTHER): Payer: Medicare Other | Admitting: Internal Medicine

## 2011-05-24 ENCOUNTER — Ambulatory Visit (INDEPENDENT_AMBULATORY_CARE_PROVIDER_SITE_OTHER)
Admission: RE | Admit: 2011-05-24 | Discharge: 2011-05-24 | Disposition: A | Discharge status: Home / Self Care | Payer: Medicare Other | Source: Ambulatory Visit | Attending: Internal Medicine | Admitting: Internal Medicine

## 2011-05-24 DIAGNOSIS — I10 Essential (primary) hypertension: Secondary | ICD-10-CM

## 2011-05-24 DIAGNOSIS — Z79899 Other long term (current) drug therapy: Secondary | ICD-10-CM

## 2011-05-24 DIAGNOSIS — E785 Hyperlipidemia, unspecified: Secondary | ICD-10-CM

## 2011-05-24 LAB — COMPREHENSIVE METABOLIC PANEL
ALT: 14 U/L (ref 0–35)
AST: 18 U/L (ref 0–37)
CO2: 26 mEq/L (ref 19–32)
Creatinine, Ser: 0.7 mg/dL (ref 0.4–1.2)
GFR: 91.61 mL/min (ref >60.00)
Total Bilirubin: 1.5 mg/dL — ABNORMAL HIGH (ref 0.3–1.2)

## 2011-05-24 LAB — HEPATIC FUNCTION PANEL
ALT: 14 U/L (ref 0–35)
AST: 18 U/L (ref 0–37)
Albumin: 4.3 g/dL (ref 3.5–5.2)
Alkaline Phosphatase: 55 U/L (ref 39–117)
Bilirubin, Direct: 0.2 mg/dL (ref 0.0–0.3)
Total Protein: 7 g/dL (ref 6.0–8.3)

## 2011-05-24 LAB — LIPID PANEL
Cholesterol: 200 mg/dL (ref 0–200)
HDL: 65 mg/dL (ref >39.00)
LDL Cholesterol: 120 mg/dL — ABNORMAL HIGH (ref 0–99)
Total CHOL/HDL Ratio: 3
Triglycerides: 74 mg/dL (ref 0.0–149.0)

## 2011-05-24 LAB — HEMOGLOBIN A1C: Hgb A1c MFr Bld: 5.7 % (ref 4.6–6.5)

## 2011-05-24 MED ORDER — ATORVASTATIN CALCIUM 10 MG PO TABS: 10.0000 mg | ORAL_TABLET | Freq: Every day | ORAL | Status: DC

## 2011-05-24 MED ORDER — FUROSEMIDE 20 MG PO TABS: 20.0000 mg | ORAL_TABLET | Freq: Every day | ORAL | Status: DC

## 2011-05-24 NOTE — Progress Notes (Signed)
  Subjective:    Patient ID: [name], female    DOB: 09/24/1936, 75 y.o.   MRN: [id number]  HPI Mrs. Turko was recently found to have elevated BP in the range of 160-180/80-90 with mild symptoms. She was started on lisinopril 5mg  but she reports that she is having headaches with the medication and is interested in alternative medication.   She had been on lipitor but developed a rash which in retrospect may not have been the drug. She was started on simvastatin but has leg pain.   Past Medical History  Diagnosis Date  . Hemorrhoids, external   . Personal history of diseases of skin and subcutaneous tissue   . Other and unspecified hyperlipidemia   . Personal history of unspecified disease   . Need for prophylactic hormone replacement therapy (postmenopausal)   . Allergic rhinitis, cause unspecified   . Irritable bowel syndrome    Past Surgical History  Procedure Date  . Benign moles removed   . Sebaceous cyst excised   . Hemorrhoid surgery    Family History  Problem Relation Age of Onset  . Heart disease Father   . Cancer Sister     breast  . Heart disease Other   . Heart disease Other   . Heart disease Other   . Cancer Sister     breast  . Hypothyroidism Sister    History   Social History  . Marital Status: Married    Spouse Name: N/A    Number of Children: 4  . Years of Education: N/A   Occupational History  . retired    Social History Main Topics  . Smoking status: Former Games developer  . Smokeless tobacco: Never Used  . Alcohol Use: Yes  . Drug Use: No  . Sexually Active: Yes -- Female partner(s)   Other Topics Concern  . Not on file   Social History Narrative   College grad. Married '61. 4 daughters, 2 grandchildren. Work - Furniture conservator/restorer mfg. - retired.       Review of Systems Review of Systems  Constitutional:  Negative for fever, chills, activity change and unexpected weight change.  HEENT:  Negative for hearing loss, ear pain, congestion,  neck stiffness and postnasal drip. Negative for sore throat or swallowing problems. Negative for dental complaints.   Eyes: Negative for vision loss or change in visual acuity.  Respiratory: Negative for chest tightness and wheezing.   Cardiovascular: Negative for chest pain and palpitationNo decreased exercise tolerance Gastrointestinal: No change in bowel habit. No bloating or gas. No reflux or indigestion Genitourinary: Negative for urgency, frequency, flank pain and difficulty urinating.  Musculoskeletal: Negative for myalgias, back pain, arthralgias and gait problem.  Neurological: Negative for dizziness, tremors, weakness and headaches.  Hematological: Negative for adenopathy.  Psychiatric/Behavioral: Negative for behavioral problems and dysphoric mood.       Objective:   Physical Exam Vitals noted Gen'l - WNWD white woman in no distress HEENT - C&S clear Pul - normal respirations Cor - RRR Derm - no rashes       Assessment & Plan:

## 2011-05-24 NOTE — Telephone Encounter (Signed)
Pt left vm req RF of epipen and req a call back to discuss getting scheduled for EKG?

## 2011-05-24 NOTE — Patient Instructions (Signed)
Blood pressure - plan is to change to a diuretic, furosemide 20mg  once a day, with monitoring of BP and report back in 2-3 weeks about levels. If running greater than 140/90 will add a second agent. Will get baseline EKG and Chest x-ray today.  Cholesterol - will change to lipitor 210mg  once a day and get follow-up lab in 6 weeks.

## 2011-05-25 ENCOUNTER — Encounter: Payer: Self-pay | Admitting: Internal Medicine

## 2011-05-25 MED ORDER — EPINEPHRINE 0.3 MG/0.3ML IJ DEVI: 0.3000 mg | INTRAMUSCULAR | Status: DC | PRN

## 2011-05-25 NOTE — Assessment & Plan Note (Signed)
Newly diagnosed with hypertension. She did have mild symptoms of head fullness but no diplopia, epistaxis, or any other focal signs.   Plan - d/c lisinopril           Start furosemide 20mg  as first step treatment           Routine CXR. Will need  12 Lead EKG at next visit           Monitor BP at home and report back. If not well controlled, SBP<140,DBP<90, will add either an ARB or CCB           Lab in 4-6 weeks: metabolic panel

## 2011-05-25 NOTE — Telephone Encounter (Signed)
Advised that EKG was for during CPX and they can schedule at any time. Also did refill and told husband that if pt needed any more rfs to call office

## 2011-05-25 NOTE — Assessment & Plan Note (Signed)
Patient would like to stop zocor and retry lipitor.  Plan - Lipitor 10 mg           F/u lab in 4-6 weeks.

## 2011-05-30 ENCOUNTER — Encounter: Payer: Self-pay | Admitting: Internal Medicine

## 2011-07-07 ENCOUNTER — Telehealth: Payer: Self-pay | Admitting: *Deleted

## 2011-07-07 NOTE — Telephone Encounter (Signed)
Pt calling c/o rash started this am, no itching. No other symptoms.  Only new drugs are furosemide and atorvastatin started 1 mo ago. But she stopped furosemide 1 wk ago. I advised pt to go to ER or UC if she develops SOB, throat/tongue swelling, etc. Otherwise, OV tom with Dr. Debby Bud to further eval rash.

## 2011-07-08 ENCOUNTER — Ambulatory Visit (INDEPENDENT_AMBULATORY_CARE_PROVIDER_SITE_OTHER): Payer: Medicare Other | Admitting: Internal Medicine

## 2011-07-08 ENCOUNTER — Encounter: Payer: Self-pay | Admitting: Internal Medicine

## 2011-07-08 DIAGNOSIS — L509 Urticaria, unspecified: Secondary | ICD-10-CM

## 2011-07-08 NOTE — Progress Notes (Signed)
  Subjective:    Patient ID: [name], female    DOB: [birth year], 75 y.o.   MRN: [id number]  HPI [name] presents for an outbreak of a rash yesterday on the upper chest: red, raised, hot but not itchy. She has been started on atorvastatin and furosemide in the last month. She did get a great response to furosemide with SBP in the 100 range. She had no respiratory symptoms with the rash. She did take benadryl and claritin. The rash resolved.   I have reviewed the patient's medical history in detail and updated the computerized patient record.    Review of Systems  Constitutional: Negative.   HENT: Negative.   Respiratory: Negative.   Cardiovascular: Negative.   Hematological: Negative.        Objective:   Physical Exam Vitals noted Gen'l- WNWD white woman in no distress Pulmonary - normal respirations Cor - RRR Derm - no apparent rash on anterior chest or arms       Assessment & Plan:  Skin rash - pictures from her cell phone suggest urticaria. She has only recently been on furosemide and atorvastatin.  Plan - hold meds then rechallenge one drug at a time.           Report back on tolerance.

## 2011-07-08 NOTE — Patient Instructions (Signed)
Rash - the pictures looked like hives. Plan - hold both furosemide and lipitor. Monday restart furosemide at 10 mg , 1/2 tab, daily for 10 days. If no rash develops STOP the furosemide, wait 2 days and restart the lipitor for 10 days. If no rash then you can resume both medications with a low likelihood that you are having a reaction. If you have a reaction to either drug let me know and we can come up with an alternative.

## 2011-07-18 NOTE — Telephone Encounter (Signed)
Pt had OV on 07-08-11. Closing phone note.

## 2011-08-18 ENCOUNTER — Other Ambulatory Visit: Payer: Self-pay | Admitting: Internal Medicine

## 2011-08-31 ENCOUNTER — Emergency Department (HOSPITAL_COMMUNITY): Payer: Medicare Other

## 2011-08-31 ENCOUNTER — Emergency Department (HOSPITAL_COMMUNITY)
Admission: EM | Admit: 2011-08-31 | Discharge: 2011-08-31 | Disposition: A | Discharge status: Home / Self Care | Payer: Medicare Other | Attending: Emergency Medicine | Admitting: Emergency Medicine

## 2011-08-31 DIAGNOSIS — R5381 Other malaise: Secondary | ICD-10-CM | POA: Insufficient documentation

## 2011-08-31 DIAGNOSIS — R42 Dizziness and giddiness: Secondary | ICD-10-CM | POA: Insufficient documentation

## 2011-08-31 DIAGNOSIS — R05 Cough: Secondary | ICD-10-CM | POA: Insufficient documentation

## 2011-08-31 DIAGNOSIS — I10 Essential (primary) hypertension: Secondary | ICD-10-CM | POA: Insufficient documentation

## 2011-08-31 DIAGNOSIS — R059 Cough, unspecified: Secondary | ICD-10-CM | POA: Insufficient documentation

## 2011-08-31 DIAGNOSIS — R5383 Other fatigue: Secondary | ICD-10-CM | POA: Insufficient documentation

## 2011-08-31 LAB — DIFFERENTIAL
Basophils Absolute: 0 10*3/uL (ref 0.0–0.1)
Basophils Relative: 0 % (ref 0–1)
Eosinophils Absolute: 0 10*3/uL (ref 0.0–0.7)
Eosinophils Relative: 0 % (ref 0–5)
Lymphocytes Relative: 17 % (ref 12–46)
Lymphs Abs: 1.6 10*3/uL (ref 0.7–4.0)
Monocytes Absolute: 0.4 10*3/uL (ref 0.1–1.0)
Monocytes Relative: 4 % (ref 3–12)
Neutro Abs: 7.3 10*3/uL (ref 1.7–7.7)
Neutrophils Relative %: 79 % — ABNORMAL HIGH (ref 43–77)

## 2011-08-31 LAB — POCT I-STAT TROPONIN I: Troponin i, poc: 0.01 ng/mL (ref 0.00–0.08)

## 2011-08-31 LAB — BASIC METABOLIC PANEL
BUN: 10 mg/dL (ref 6–23)
CO2: 24 mEq/L (ref 19–32)
Calcium: 9.6 mg/dL (ref 8.4–10.5)
Chloride: 103 mEq/L (ref 96–112)
Creatinine, Ser: 0.74 mg/dL (ref 0.50–1.10)
GFR calc Af Amer: >60 mL/min (ref >60)
GFR calc non Af Amer: >60 mL/min (ref >60)
Glucose, Bld: 89 mg/dL (ref 70–99)
Potassium: 3.5 mEq/L (ref 3.5–5.1)
Sodium: 141 mEq/L (ref 135–145)

## 2011-08-31 LAB — CBC
HCT: 41.8 % (ref 36.0–46.0)
Hemoglobin: 14.3 g/dL (ref 12.0–15.0)
MCH: 30.7 pg (ref 26.0–34.0)
MCHC: 34.2 g/dL (ref 30.0–36.0)
MCV: 89.7 fL (ref 78.0–100.0)
Platelets: 227 10*3/uL (ref 150–400)
RBC: 4.66 MIL/uL (ref 3.87–5.11)
RDW: 12.6 % (ref 11.5–15.5)
WBC: 9.3 10*3/uL (ref 4.0–10.5)

## 2011-09-09 ENCOUNTER — Ambulatory Visit (INDEPENDENT_AMBULATORY_CARE_PROVIDER_SITE_OTHER): Payer: Medicare Other | Admitting: Internal Medicine

## 2011-09-09 ENCOUNTER — Encounter: Payer: Self-pay | Admitting: Internal Medicine

## 2011-09-09 VITALS — BP 150/78 | HR 71 | Temp 97.8°F | Wt 140.0 lb

## 2011-09-09 DIAGNOSIS — E785 Hyperlipidemia, unspecified: Secondary | ICD-10-CM

## 2011-09-09 DIAGNOSIS — I10 Essential (primary) hypertension: Secondary | ICD-10-CM

## 2011-09-09 MED ORDER — METOPROLOL SUCCINATE ER 25 MG PO TB24: 25.0000 mg | ORAL_TABLET | Freq: Every day | ORAL | Status: DC

## 2011-09-09 NOTE — Patient Instructions (Signed)
Hyperlipidemia - very likely to be in-born error of metabolism. You also have several risk factors especially a positive family history. The Framingham risk calculation for having a cardiac event in the next 10 years was 9% = low risk, but the risk factors make this less applicable.  Plan - trial of crestor 5 mg M-W-F. Repeat lab in 1 month.  Blood pressure control - with BP 140+ we should treat this. Plan - toprol XL 25 mg - a long acting beta-blocker will be effective and may also help to curtail anxiety. Will need to monitor BP at home or you can come by for a nurse visit.   Reviewed all ED data - was ok: normal labs, studies and evaluation.

## 2011-09-11 NOTE — Assessment & Plan Note (Signed)
Patinet with very high LDL. Other risk factors include age, post-menopausal state and strong family h/o heart disease.  Plan - trial of Crestor 5 mg M-W-F           F/u lab in 4 weeks if she tolerates the medication.

## 2011-09-11 NOTE — Assessment & Plan Note (Signed)
Patient with family h/o heart disease. Despite a NCEP/Framingham cardiac risk of 9% she is a definite candidate for treatment.  Plan - trial of toprol xl 25 mg once a day for BP control. An ancillary benefit may be reduction in anxiety. She is advised that she may feel "draggy" at first but that this should resolve.            Follow-up BP check either at home or here.

## 2011-09-11 NOTE — Progress Notes (Signed)
  Subjective:    Patient ID: [name], female    DOB: Feb 08, 1936, 75 y.o.   MRN: [id number]  HPI [name] presents for follow-up of hyperlipidemia and hypertension. She had been started on furosemide for elevated BP in June '12. She since then has been having dizzy spells along with a tight feeling in her head. For these symptoms she went to the ED - records reviewed - negative work-up. At that time she stopped furosemide and has been doing better.    She had been on lipitor for treatment of lipids but stopped several weeks ago due to feeling extremely weak: she could not lift her arms above her head in the AM. These symptoms resolved with cessation of lipitor.   I have reviewed the patient's medical history in detail and updated the computerized patient record.    Review of Systems System review is negative for any constitutional, cardiac, pulmonary, GI or neuro symptoms or complaints other than as described in the HPI. In addition she reports increased stress with her cat's death and her husband's progressive memory issues.      Objective:   Physical Exam Vitals - BP elevated Gen'l - WNWD white woman in no distress Cor - 2+ radial and RRR Pulm - normal respirations.       Assessment & Plan:  (assist Adalberto Cole MSIV)

## 2012-07-14 DIAGNOSIS — L0390 Cellulitis, unspecified: Secondary | ICD-10-CM | POA: Diagnosis not present

## 2012-07-14 DIAGNOSIS — W5581XA Bitten by other mammals, initial encounter: Secondary | ICD-10-CM | POA: Diagnosis not present

## 2012-07-14 DIAGNOSIS — L0291 Cutaneous abscess, unspecified: Secondary | ICD-10-CM | POA: Diagnosis not present

## 2012-07-14 DIAGNOSIS — T148 Other injury of unspecified body region: Secondary | ICD-10-CM | POA: Diagnosis not present

## 2012-07-14 DIAGNOSIS — S51809A Unspecified open wound of unspecified forearm, initial encounter: Secondary | ICD-10-CM | POA: Diagnosis not present

## 2012-07-15 DIAGNOSIS — S51809A Unspecified open wound of unspecified forearm, initial encounter: Secondary | ICD-10-CM | POA: Diagnosis not present

## 2012-07-15 DIAGNOSIS — L0291 Cutaneous abscess, unspecified: Secondary | ICD-10-CM | POA: Diagnosis not present

## 2012-07-15 DIAGNOSIS — T148 Other injury of unspecified body region: Secondary | ICD-10-CM | POA: Diagnosis not present

## 2012-07-17 ENCOUNTER — Encounter: Payer: Self-pay | Admitting: Endocrinology

## 2012-07-17 ENCOUNTER — Ambulatory Visit (INDEPENDENT_AMBULATORY_CARE_PROVIDER_SITE_OTHER): Payer: Medicare Other | Admitting: Endocrinology

## 2012-07-17 ENCOUNTER — Encounter (HOSPITAL_COMMUNITY): Payer: Self-pay | Admitting: *Deleted

## 2012-07-17 ENCOUNTER — Emergency Department (HOSPITAL_COMMUNITY)
Admission: EM | Admit: 2012-07-17 | Discharge: 2012-07-17 | Discharge status: Against Medical Advice | Payer: Medicare Other | Attending: Emergency Medicine | Admitting: Emergency Medicine

## 2012-07-17 VITALS — BP 142/82 | HR 83 | Temp 98.1°F

## 2012-07-17 DIAGNOSIS — IMO0001 Reserved for inherently not codable concepts without codable children: Secondary | ICD-10-CM | POA: Insufficient documentation

## 2012-07-17 DIAGNOSIS — IMO0002 Reserved for concepts with insufficient information to code with codable children: Secondary | ICD-10-CM

## 2012-07-17 DIAGNOSIS — I10 Essential (primary) hypertension: Secondary | ICD-10-CM | POA: Diagnosis not present

## 2012-07-17 DIAGNOSIS — S51809A Unspecified open wound of unspecified forearm, initial encounter: Secondary | ICD-10-CM | POA: Insufficient documentation

## 2012-07-17 DIAGNOSIS — L03114 Cellulitis of left upper limb: Secondary | ICD-10-CM

## 2012-07-17 MED ORDER — DOXYCYCLINE HYCLATE 100 MG PO TABS: 100.0000 mg | ORAL_TABLET | Freq: Two times a day (BID) | ORAL | Status: DC

## 2012-07-17 NOTE — Patient Instructions (Addendum)
i have sent a prescription to your pharmacy, for a different antibiotic. Stop the clindamycin and augmentin. I hope you feel better soon.  If you don't feel better by next week, please call back.  Please call sooner if you get worse.

## 2012-07-17 NOTE — Progress Notes (Signed)
Subjective:    Patient ID: [name], female    DOB: 01/29/1936, 76 y.o.   MRN: [id number]  HPI Pt states 4 days of pain at the left forearm, but no assoc fever.  This started in the context of being bitten by her dtr's cat.  She was rx'ed augmentin.  However, she developed a rash, so this had to be stopped.  It was changed to cleocin, but this caused diarrhea.   Past Medical History  Diagnosis Date  . Hemorrhoids, external   . Personal history of diseases of skin and subcutaneous tissue   . Other and unspecified hyperlipidemia   . Personal history of unspecified disease   . Need for prophylactic hormone replacement therapy (postmenopausal)   . Allergic rhinitis, cause unspecified   . Irritable bowel syndrome     Past Surgical History  Procedure Date  . Benign moles removed   . Sebaceous cyst excised   . Hemorrhoid surgery     History   Social History  . Marital Status: Married    Spouse Name: N/A    Number of Children: 4  . Years of Education: N/A   Occupational History  . retired    Social History Main Topics  . Smoking status: Former Games developer  . Smokeless tobacco: Never Used  . Alcohol Use: Yes     rarely  . Drug Use: No  . Sexually Active: Yes -- Female partner(s)   Other Topics Concern  . Not on file   Social History Narrative   College grad. Married '61. 4 daughters, 2 grandchildren. Work - Furniture conservator/restorer mfg. - retired.    Current Outpatient Prescriptions on File Prior to Visit  Medication Sig Dispense Refill  . EPINEPHrine (EPIPEN) 0.3 mg/0.3 mL DEVI Inject 0.3 mLs (0.3 mg total) into the muscle as needed.  2 Device  3  . estradiol (ESTRACE) 1 MG tablet Take 0.5 mg by mouth daily.       . fluticasone (FLONASE) 50 MCG/ACT nasal spray USE 1 SPRAY IN EACH NOSTRIL ONCE A DAY  3 Act  3  . progesterone (PROMETRIUM) 100 MG capsule Take 100 mg by mouth daily.        [name] triamcinolone (KENALOG) 0.5 % cream Apply topically 3 (three) times daily.           Allergies  Allergen Reactions  . Acetaminophen     REACTION: rash  . Aspirin     REACTION: rash  . Cephalexin     REACTION: diaphoretic and drop in Blood pressure  . Clindamycin/Lincomycin Diarrhea and Other (See Comments)    Feeling like she was going to "pass out" Edgy  . Ibuprofen     REACTION: rash  . Augmentin (Amoxicillin-Pot Clavulanate) Diarrhea and Rash    Family History  Problem Relation Age of Onset  . Heart disease Father   . Cancer Sister     breast  . Heart disease Other   . Heart disease Other   . Heart disease Other   . Cancer Sister     breast  . Hypothyroidism Sister     BP 142/82  Pulse 83  Temp 98.1 F (36.7 C) (Oral)  SpO2 96%   Review of Systems Denies sob and dark urine    Objective:   Physical Exam VITAL SIGNS:  See vs page GENERAL: no distress Left forearm:  3 bite wounds, each with a few cm rim of erythema/tend/swelling      Assessment & Plan:  Cellulitis, due to cat bites,new HTN, with probable situational component Diarrhea, due to cleocin

## 2012-07-17 NOTE — ED Notes (Signed)
Pt states "got bit my daughter's cat last Friday, it was 2 months out on it's shots, I went to [facility], was given augmentin but I got a rash, and then I got clindamycin but I think my arm is getting worse"; pt presents with 2 puncture wounds to left forearm, reddened and edematous.

## 2012-07-18 ENCOUNTER — Encounter: Payer: Self-pay | Admitting: Endocrinology

## 2012-07-18 ENCOUNTER — Ambulatory Visit (INDEPENDENT_AMBULATORY_CARE_PROVIDER_SITE_OTHER): Payer: Medicare Other | Admitting: Endocrinology

## 2012-07-18 VITALS — BP 132/84 | HR 72 | Temp 97.1°F

## 2012-07-18 DIAGNOSIS — L03114 Cellulitis of left upper limb: Secondary | ICD-10-CM | POA: Insufficient documentation

## 2012-07-18 DIAGNOSIS — J309 Allergic rhinitis, unspecified: Secondary | ICD-10-CM

## 2012-07-18 NOTE — Progress Notes (Signed)
Subjective:    Patient ID: [name], female    DOB: 28-Dec-1935, 76 y.o.   MRN: [id number]  HPI Redness at the right forearm is less today.  Pt states few days of nasal congestion, and assoc hoarseness.  She had not recently used the flonase. Past Medical History  Diagnosis Date  . Hemorrhoids, external   . Personal history of diseases of skin and subcutaneous tissue   . Other and unspecified hyperlipidemia   . Personal history of unspecified disease   . Need for prophylactic hormone replacement therapy (postmenopausal)   . Allergic rhinitis, cause unspecified   . Irritable bowel syndrome     Past Surgical History  Procedure Date  . Benign moles removed   . Sebaceous cyst excised   . Hemorrhoid surgery     History   Social History  . Marital Status: Married    Spouse Name: N/A    Number of Children: 4  . Years of Education: N/A   Occupational History  . retired    Social History Main Topics  . Smoking status: Former Games developer  . Smokeless tobacco: Never Used  . Alcohol Use: Yes     rarely  . Drug Use: No  . Sexually Active: Yes -- Female partner(s)   Other Topics Concern  . Not on file   Social History Narrative   College grad. Married '61. 4 daughters, 2 grandchildren. Work - Furniture conservator/restorer mfg. - retired.    Current Outpatient Prescriptions on File Prior to Visit  Medication Sig Dispense Refill  . doxycycline (VIBRA-TABS) 100 MG tablet Take 1 tablet (100 mg total) by mouth 2 (two) times daily.  14 tablet  0  . EPINEPHrine (EPIPEN) 0.3 mg/0.3 mL DEVI Inject 0.3 mLs (0.3 mg total) into the muscle as needed.  2 Device  3  . estradiol (ESTRACE) 1 MG tablet Take 0.5 mg by mouth daily.       . fluticasone (FLONASE) 50 MCG/ACT nasal spray USE 1 SPRAY IN EACH NOSTRIL ONCE A DAY  3 Act  3  . progesterone (PROMETRIUM) 100 MG capsule Take 100 mg by mouth daily.        [name] triamcinolone (KENALOG) 0.5 % cream Apply topically 3 (three) times daily.          Allergies    Allergen Reactions  . Acetaminophen     REACTION: rash  . Aspirin     REACTION: rash  . Cephalexin     REACTION: diaphoretic and drop in Blood pressure  . Clindamycin/Lincomycin Diarrhea and Other (See Comments)    Feeling like she was going to "pass out" Edgy  . Ibuprofen     REACTION: rash  . Augmentin (Amoxicillin-Pot Clavulanate) Diarrhea and Rash    Family History  Problem Relation Age of Onset  . Heart disease Father   . Cancer Sister     breast  . Heart disease Other   . Heart disease Other   . Heart disease Other   . Cancer Sister     breast  . Hypothyroidism Sister     BP 132/84  Pulse 72  Temp 97.1 F (36.2 C) (Oral)  SpO2 97%     Review of Systems Denies fever and earache    Objective:   Physical Exam VITAL SIGNS:  See vs page GENERAL: no distress head: no deformity eyes: no periorbital swelling, no proptosis external nose and ears are normal mouth: no lesion seen Both eac's and tm's are normal  Left forearm: erythema is less today    Assessment & Plan:  Allergic rhinitis, recurrent Cellulitis, improved

## 2012-07-18 NOTE — Patient Instructions (Addendum)
Please finish the doxycycline Here is a sample of "q-nasl."  Take 2 sprays each side daily Loratadine-d (non-prescription) will help your congestion. I hope you feel better soon.  If you don't feel better by next week, please call back.

## 2012-07-24 ENCOUNTER — Encounter: Payer: Self-pay | Admitting: Internal Medicine

## 2012-07-24 ENCOUNTER — Ambulatory Visit (INDEPENDENT_AMBULATORY_CARE_PROVIDER_SITE_OTHER): Payer: Medicare Other | Admitting: Internal Medicine

## 2012-07-24 VITALS — BP 132/88 | HR 77 | Temp 97.6°F | Resp 16 | Wt 133.0 lb

## 2012-07-24 DIAGNOSIS — L03114 Cellulitis of left upper limb: Secondary | ICD-10-CM

## 2012-07-24 DIAGNOSIS — IMO0002 Reserved for concepts with insufficient information to code with codable children: Secondary | ICD-10-CM

## 2012-07-24 NOTE — Patient Instructions (Addendum)
Cat bite - does look ok. No indication for more antibiotics. Watch the area on the inner aspect of the forearm for any increasing redness or heat or swelling. Call if there are change.  "Me Time" is very important for care-givers!!! Think about an exercise class, book club, movie club - some form of getting away on a regular basis.   Blood pressure - looks good today.   Animal Bite An animal bite can result in a scratch on the skin, deep open cut, puncture of the skin, crush injury, or tearing away of the skin or a body part. Dogs are responsible for most animal bites. Children are bitten more often than adults. An animal bite can range from very mild to more serious. A small bite from your house pet is no cause for alarm. However, some animal bites can become infected or injure a bone or other tissue. You must seek medical care if:  The skin is broken and bleeding does not slow down or stop after 15 minutes.   The puncture is deep and difficult to clean (such as a cat bite).   Pain, warmth, redness, or pus develops around the wound.   The bite is from a stray animal or rodent. There may be a risk of rabies infection.   The bite is from a snake, raccoon, skunk, fox, coyote, or bat. There may be a risk of rabies infection.   The person bitten has a chronic illness such as diabetes, liver disease, or cancer, or the person takes medicine that lowers the immune system.   There is concern about the location and severity of the bite.  It is important to clean and protect an animal bite wound right away to prevent infection. Follow these steps:  Clean the wound with plenty of water and soap.   Apply an antibiotic cream.   Apply gentle pressure over the wound with a clean towel or gauze to slow or stop bleeding.   Elevate the affected area above the heart to help stop any bleeding.   Seek medical care. Getting medical care within 8 hours of the animal bite leads to the best possible outcome.    DIAGNOSIS   Your caregiver will most likely:  Take a detailed history of the animal and the bite injury.   Perform a wound exam.   Take your medical history.  Blood tests or X-rays may be performed. Sometimes, infected bite wounds are cultured and sent to a lab to identify the infectious bacteria.   TREATMENT   Medical treatment will depend on the location and type of animal bite as well as the patient's medical history. Treatment may include:  Wound care, such as cleaning and flushing the wound with saline solution, bandaging, and elevating the affected area.   Antibiotics.   Tetanus immunization.   Rabies immunization.   Leaving the wound open to heal. This is often done with animal bites, due to the high risk of infection. However, in certain cases, wound closure with stitches, wound adhesive, skin adhesive strips, or staples may be used.   Infected bites that are left untreated may require intravenous (IV) antibiotics and surgical treatment in the hospital. HOME CARE INSTRUCTIONS  Follow your caregiver's instructions for wound care.   Take all medicines as directed.   If your caregiver prescribes antibiotics, take them as directed. Finish them even if you start to feel better.   Follow up with your caregiver for further exams or immunizations as directed.  You may need a tetanus shot if:  You cannot remember when you had your last tetanus shot.   You have never had a tetanus shot.   The injury broke your skin.  If you get a tetanus shot, your arm may swell, get red, and feel warm to the touch. This is common and not a problem. If you need a tetanus shot and you choose not to have one, there is a rare chance of getting tetanus. Sickness from tetanus can be serious. SEEK MEDICAL CARE IF:  You notice warmth, redness, soreness, swelling, pus discharge, or a bad smell coming from the wound.   You have a red line on the skin coming from the wound.   You have a fever,  chills, or a general ill feeling.   You have nausea or vomiting.   You have continued or worsening pain.   You have trouble moving the injured part.   You have other questions or concerns.  MAKE SURE YOU:  Understand these instructions.   Will watch your condition.   Will get help right away if you are not doing well or get worse.  Document Released: 08/16/2011 Document Revised: 11/17/2011 Document Reviewed: 08/16/2011 [facility] Patient Information 2012 [place], [place].

## 2012-07-25 NOTE — Assessment & Plan Note (Signed)
Patient has completed full course of doxy. Wounds look good without evidence of persistent infection. There is very minimal residual erythema.  Plan No additional antibiotics  Careful watching - if there is increasing redness to the arm or swelling or pain she will need to return

## 2012-07-25 NOTE — Progress Notes (Signed)
  Subjective:    Patient ID: [name], female    DOB: 11/27/1936, 76 y.o.   MRN: [id number]  HPI [name] was bitten by a cat left forearm with puncture wounds lateral arm and medial arm. She had erythema and swelling. She was seen at Urgent care and started on Augmentin which caused a widely distributed macular -papular rash after 1 day. She returned to urgenct care and was switched to clindamycin which cause immediate diarrhea - she is informed that this isn't really a drug reaction. She saw Dr. Everardo All and was started on Doxycycline which she says makes her anxious and feel short of breath but she was able to complete a full course. Her arm is much better: no swelling, very little erythema. She has had no fever or chills, no pain in the arm.  PMH, FamHx and SocHx reviewed for any changes and relevance.  Current Outpatient Prescriptions on File Prior to Visit  Medication Sig Dispense Refill  . EPINEPHrine (EPIPEN) 0.3 mg/0.3 mL DEVI Inject 0.3 mLs (0.3 mg total) into the muscle as needed.  2 Device  3  . estradiol (ESTRACE) 1 MG tablet Take 0.5 mg by mouth daily.       . fluticasone (FLONASE) 50 MCG/ACT nasal spray USE 1 SPRAY IN EACH NOSTRIL ONCE A DAY  3 Act  3  . progesterone (PROMETRIUM) 100 MG capsule Take 100 mg by mouth daily.        [name] triamcinolone (KENALOG) 0.5 % cream Apply topically 3 (three) times daily.           Review of Systems System review is negative for any constitutional, cardiac, pulmonary, GI or neuro symptoms or complaints other than as described in the HPI.       Objective:   Physical Exam Filed Vitals:   07/24/12 0929  BP: 132/88  Pulse: 77  Temp: 97.6 F (36.4 C)  Resp: 16   Gen'l- WNWD white woman in no distress Derm - bite wounds on left forearm without drainage, calore, rubor. Minimal residual erythema/pinkness at medial wound. No flutuance.      Assessment & Plan:

## 2012-08-29 DIAGNOSIS — H2510 Age-related nuclear cataract, unspecified eye: Secondary | ICD-10-CM | POA: Diagnosis not present

## 2012-09-27 DIAGNOSIS — H2510 Age-related nuclear cataract, unspecified eye: Secondary | ICD-10-CM | POA: Diagnosis not present

## 2012-09-27 DIAGNOSIS — H2589 Other age-related cataract: Secondary | ICD-10-CM | POA: Diagnosis not present

## 2012-10-02 DIAGNOSIS — L819 Disorder of pigmentation, unspecified: Secondary | ICD-10-CM | POA: Diagnosis not present

## 2012-10-02 DIAGNOSIS — D485 Neoplasm of uncertain behavior of skin: Secondary | ICD-10-CM | POA: Diagnosis not present

## 2012-10-05 DIAGNOSIS — Z1231 Encounter for screening mammogram for malignant neoplasm of breast: Secondary | ICD-10-CM | POA: Diagnosis not present

## 2012-10-05 DIAGNOSIS — Z23 Encounter for immunization: Secondary | ICD-10-CM | POA: Diagnosis not present

## 2012-10-05 DIAGNOSIS — Z124 Encounter for screening for malignant neoplasm of cervix: Secondary | ICD-10-CM | POA: Diagnosis not present

## 2013-02-04 ENCOUNTER — Ambulatory Visit (INDEPENDENT_AMBULATORY_CARE_PROVIDER_SITE_OTHER): Payer: Medicare Other | Admitting: Internal Medicine

## 2013-02-04 ENCOUNTER — Encounter: Payer: Self-pay | Admitting: Internal Medicine

## 2013-02-04 VITALS — BP 122/72 | HR 73 | Temp 96.9°F | Ht 63.0 in | Wt 132.0 lb

## 2013-02-04 DIAGNOSIS — K219 Gastro-esophageal reflux disease without esophagitis: Secondary | ICD-10-CM

## 2013-02-04 DIAGNOSIS — K2970 Gastritis, unspecified, without bleeding: Secondary | ICD-10-CM

## 2013-02-04 DIAGNOSIS — K2990 Gastroduodenitis, unspecified, without bleeding: Secondary | ICD-10-CM | POA: Diagnosis not present

## 2013-02-04 MED ORDER — FLUTICASONE PROPIONATE 50 MCG/ACT NA SUSP: NASAL | Status: DC

## 2013-02-04 MED ORDER — PANTOPRAZOLE SODIUM 20 MG PO TBEC: 20.0000 mg | DELAYED_RELEASE_TABLET | Freq: Every day | ORAL | Status: DC

## 2013-02-04 NOTE — Progress Notes (Signed)
Subjective:    Patient ID: [name], female    DOB: Nov 23, 1936, 77 y.o.   MRN: [id number]  HPI  Pt presents to the clinic today with c/o stomach irritation. This started one week after she started taking 2 baby aspirin at night after seeing a episode on Dr. Neil Crouch about the potential benefits of taking aspirin. She reports stomach irritation and reflux symptoms. She has never had reflux in the past. She has been taking tums with mild relief. She has stopped taking the aspirin at this point. She was not taking it with food. She has never tried taking aspirin in the past. She denies abnormal bleeding or blood in her stool.  Review of Systems      Past Medical History  Diagnosis Date  . Hemorrhoids, external   . Personal history of diseases of skin and subcutaneous tissue   . Other and unspecified hyperlipidemia   . Personal history of unspecified disease   . Need for prophylactic hormone replacement therapy (postmenopausal)   . Allergic rhinitis, cause unspecified   . Irritable bowel syndrome     Current Outpatient Prescriptions  Medication Sig Dispense Refill  . EPINEPHrine (EPIPEN) 0.3 mg/0.3 mL DEVI Inject 0.3 mLs (0.3 mg total) into the muscle as needed.  2 Device  3  . estradiol (ESTRACE) 1 MG tablet Take 0.5 mg by mouth daily.       . fluticasone (FLONASE) 50 MCG/ACT nasal spray USE 1 SPRAY IN EACH NOSTRIL ONCE A DAY  16 g  2  . progesterone (PROMETRIUM) 100 MG capsule Take 100 mg by mouth daily.        . pantoprazole (PROTONIX) 20 MG tablet Take 1 tablet (20 mg total) by mouth daily.  30 tablet  0   No current facility-administered medications for this visit.    Allergies  Allergen Reactions  . Acetaminophen     REACTION: rash  . Aspirin     REACTION: rash  . Cephalexin     REACTION: diaphoretic and drop in Blood pressure  . Ibuprofen     REACTION: rash  . Augmentin (Amoxicillin-Pot Clavulanate) Diarrhea and Rash    Family History  Problem Relation Age of Onset   . Heart disease Father   . Cancer Sister     breast  . Heart disease Other   . Heart disease Other   . Heart disease Other   . Cancer Sister     breast  . Hypothyroidism Sister     History   Social History  . Marital Status: Married    Spouse Name: N/A    Number of Children: 4  . Years of Education: N/A   Occupational History  . retired    Social History Main Topics  . Smoking status: Former Games developer  . Smokeless tobacco: Never Used  . Alcohol Use: Yes     Comment: rarely  . Drug Use: No  . Sexually Active: Yes -- Female partner(s)   Other Topics Concern  . Not on file   Social History Narrative   College grad. Married '61. 4 daughters, 2 grandchildren. Work - Furniture conservator/restorer mfg. - retired.     Constitutional: Denies fever, malaise, fatigue, headache or abrupt weight changes.   Cardiovascular: Denies chest pain, chest tightness, palpitations or swelling in the hands or feet.  Gastrointestinal: Pt reports stomach irritation and reflux. Denies abdominal pain, bloating, constipation, diarrhea or blood in the stool.  Neurological: Denies dizziness, difficulty with memory, difficulty with speech  or problems with balance and coordination.   No other specific complaints in a complete review of systems (except as listed in HPI above).   Objective:   Physical Exam    BP 122/72  Pulse 73  Temp(Src) 96.9 F (36.1 C) (Oral)  Ht 5\' 3"  (1.6 m)  Wt 132 lb (59.875 kg)  BMI 23.39 kg/m2  SpO2 98% Wt Readings from Last 3 Encounters:  02/04/13 132 lb (59.875 kg)  07/24/12 133 lb (60.328 kg)  07/17/12 131 lb (59.421 kg)    General: Appears her stated age, well developed, well nourished in NAD. Cardiovascular: Normal rate and rhythm. S1,S2 noted.  No murmur, rubs or gallops noted. No JVD or BLE edema. No carotid bruits noted. Pulmonary/Chest: Normal effort and positive vesicular breath sounds. No respiratory distress. No wheezes, rales or ronchi noted.  Abdomen: Soft  and nontender. Normal bowel sounds, no bruits noted. No distention or masses noted. Liver, spleen and kidneys non palpable. Neurological: Alert and oriented. Cranial nerves II-XII intact. Coordination normal. +DTRs bilaterally.      Assessment & Plan:   Reflux and gastritis, secondary to medication, new onset with additional workup required:  Hold off on aspirin at this time eRx for protonix 20 mg daily x 1 month   RTC as needed or if symptoms persist

## 2013-02-04 NOTE — Patient Instructions (Signed)

## 2013-02-05 ENCOUNTER — Encounter (HOSPITAL_COMMUNITY): Payer: Self-pay | Admitting: Cardiology

## 2013-02-05 ENCOUNTER — Telehealth: Payer: Self-pay | Admitting: Internal Medicine

## 2013-02-05 ENCOUNTER — Emergency Department (HOSPITAL_COMMUNITY)
Admission: EM | Admit: 2013-02-05 | Discharge: 2013-02-05 | Disposition: A | Discharge status: Home / Self Care | Payer: Medicare Other | Attending: Emergency Medicine | Admitting: Emergency Medicine

## 2013-02-05 ENCOUNTER — Emergency Department (HOSPITAL_COMMUNITY): Payer: Medicare Other

## 2013-02-05 ENCOUNTER — Ambulatory Visit (INDEPENDENT_AMBULATORY_CARE_PROVIDER_SITE_OTHER): Payer: Medicare Other | Admitting: Internal Medicine

## 2013-02-05 ENCOUNTER — Encounter: Payer: Self-pay | Admitting: Internal Medicine

## 2013-02-05 VITALS — BP 158/90 | HR 72 | Temp 97.0°F | Resp 16 | Wt 133.0 lb

## 2013-02-05 DIAGNOSIS — Z8719 Personal history of other diseases of the digestive system: Secondary | ICD-10-CM | POA: Insufficient documentation

## 2013-02-05 DIAGNOSIS — Z7982 Long term (current) use of aspirin: Secondary | ICD-10-CM | POA: Diagnosis not present

## 2013-02-05 DIAGNOSIS — Z79890 Hormone replacement therapy (postmenopausal): Secondary | ICD-10-CM | POA: Insufficient documentation

## 2013-02-05 DIAGNOSIS — I10 Essential (primary) hypertension: Secondary | ICD-10-CM | POA: Diagnosis not present

## 2013-02-05 DIAGNOSIS — Z872 Personal history of diseases of the skin and subcutaneous tissue: Secondary | ICD-10-CM | POA: Insufficient documentation

## 2013-02-05 DIAGNOSIS — E785 Hyperlipidemia, unspecified: Secondary | ICD-10-CM | POA: Diagnosis not present

## 2013-02-05 DIAGNOSIS — R002 Palpitations: Secondary | ICD-10-CM | POA: Insufficient documentation

## 2013-02-05 DIAGNOSIS — R259 Unspecified abnormal involuntary movements: Secondary | ICD-10-CM | POA: Insufficient documentation

## 2013-02-05 DIAGNOSIS — R0789 Other chest pain: Secondary | ICD-10-CM | POA: Diagnosis not present

## 2013-02-05 DIAGNOSIS — IMO0002 Reserved for concepts with insufficient information to code with codable children: Secondary | ICD-10-CM | POA: Diagnosis not present

## 2013-02-05 DIAGNOSIS — Z87891 Personal history of nicotine dependence: Secondary | ICD-10-CM | POA: Insufficient documentation

## 2013-02-05 DIAGNOSIS — Z8709 Personal history of other diseases of the respiratory system: Secondary | ICD-10-CM | POA: Insufficient documentation

## 2013-02-05 DIAGNOSIS — R079 Chest pain, unspecified: Secondary | ICD-10-CM | POA: Diagnosis not present

## 2013-02-05 DIAGNOSIS — Z79899 Other long term (current) drug therapy: Secondary | ICD-10-CM | POA: Diagnosis not present

## 2013-02-05 DIAGNOSIS — Z8679 Personal history of other diseases of the circulatory system: Secondary | ICD-10-CM | POA: Insufficient documentation

## 2013-02-05 LAB — URINALYSIS, ROUTINE W REFLEX MICROSCOPIC
Glucose, UA: NEGATIVE mg/dL
Hgb urine dipstick: NEGATIVE
Nitrite: NEGATIVE
Specific Gravity, Urine: 1.008 (ref 1.005–1.030)

## 2013-02-05 LAB — BASIC METABOLIC PANEL WITH GFR
BUN: 10 mg/dL (ref 6–23)
CO2: 24 meq/L (ref 19–32)
Calcium: 9.4 mg/dL (ref 8.4–10.5)
Chloride: 104 meq/L (ref 96–112)
Creatinine, Ser: 0.72 mg/dL (ref 0.50–1.10)
GFR calc Af Amer: >90 mL/min
GFR calc non Af Amer: 82 mL/min — ABNORMAL LOW
Glucose, Bld: 108 mg/dL — ABNORMAL HIGH (ref 70–99)
Potassium: 3.8 meq/L (ref 3.5–5.1)
Sodium: 140 meq/L (ref 135–145)

## 2013-02-05 LAB — CBC
HCT: 40.1 % (ref 36.0–46.0)
Hemoglobin: 14.3 g/dL (ref 12.0–15.0)
MCHC: 35.7 g/dL (ref 30.0–36.0)
Platelets: 237 10*3/uL (ref 150–400)
RBC: 4.59 MIL/uL (ref 3.87–5.11)

## 2013-02-05 LAB — TROPONIN I: Troponin I: <0.3 ng/mL (ref <0.30)

## 2013-02-05 LAB — POCT I-STAT TROPONIN I

## 2013-02-05 MED ORDER — ALPRAZOLAM 0.25 MG PO TABS: 0.2500 mg | ORAL_TABLET | Freq: Three times a day (TID) | ORAL | Status: DC | PRN

## 2013-02-05 MED ORDER — ALPRAZOLAM 0.5 MG PO TABS
0.2500 mg | ORAL_TABLET | Freq: Once | ORAL | Status: AC
  Administered 2013-02-05: 0.25 mg via ORAL
  Filled 2013-02-05: qty 1

## 2013-02-05 NOTE — ED Notes (Signed)
Pt states she has had fluttering feeling in chest for several days, attributed it to acid reflux. Today pt developed pressure and had 1 episode "of a stitch of pain." Currently denies pain at this time, states there is mild pressure. Pt states this am "it was kind of hard breathing but the room was dry and I felt for a little bit nauseous and dizzy."  Pt states today while she was speaking with her PCP, she felt sweaty.

## 2013-02-05 NOTE — ED Notes (Signed)
Pt reports she has been having a fluttering sensation in her chest for the past couple of days. Recently seen and placed on protonix but had a reaction to it and was seen by cardiology this morning. Informed she had an abnormal ekg and to come here for further evaluation.

## 2013-02-05 NOTE — ED Notes (Signed)
Pt denies any pressure at this time. Denies "fluttering" in chest. Pt states "I think the xanax did the trick, I feel great."

## 2013-02-05 NOTE — ED Provider Notes (Signed)
History  This chart was scribed for Kathryn Racer, MD by Shari Heritage, ED Scribe. The patient was seen in room CD04C/CD04C. Patient's care was started at 1306.   CSN: [id number]  Arrival date & time 02/05/13  1213   First MD Initiated Contact with Patient 02/05/13 1306      Chief Complaint  Patient presents with  . Palpitations     Patient is a 77 y.o. female presenting with palpitations. The history is provided by the patient. No language interpreter was used.  Palpitations  This is a new problem. The current episode started more than 2 days ago. The problem occurs daily. The problem has not changed since onset.Associated with: medications?    HPI Comments: [name] is a 77 y.o. female who presents to the Emergency Department complaining of intermittent chest discomfort that she describes as fluttering for the past few days. She says that yesterday morning, she started feeling significantly worse than previous days. She saw an NP at her primary care office who attributed symptoms to indigestion and prescribed Protonix. She says that she took one dose yesterday morning at about 11 am and states that her symptoms improved for several hours. Patient states that yesterday evening while watching television, fluttering started again. She says that she also developed dry mouth and felt tremulous. She is still having these symptoms now. Patient takes Claritin and Benadryl for allergic rhinitis. She states that her last dose of Claritin was 2 days ago and last dose of Benadryl was taken 3 days ago. She states that she has been taking baby aspirin daily for the past several days. Patient's other medical history includes hyperlipidemia and IBS.   Past Medical History  Diagnosis Date  . Hemorrhoids, external   . Personal history of diseases of skin and subcutaneous tissue   . Other and unspecified hyperlipidemia   . Personal history of unspecified disease   . Need for prophylactic hormone  replacement therapy (postmenopausal)   . Allergic rhinitis, cause unspecified   . Irritable bowel syndrome     Past Surgical History  Procedure Laterality Date  . Benign moles removed    . Sebaceous cyst excised    . Hemorrhoid surgery      Family History  Problem Relation Age of Onset  . Heart disease Father   . Cancer Sister     breast  . Heart disease Other   . Heart disease Other   . Heart disease Other   . Cancer Sister     breast  . Hypothyroidism Sister     History  Substance Use Topics  . Smoking status: Former Games developer  . Smokeless tobacco: Never Used  . Alcohol Use: Yes     Comment: rarely    OB History   Grav Para Term Preterm Abortions TAB SAB Ect Mult Living                  Review of Systems  HENT:       Positive for dry mouth.  Cardiovascular: Positive for palpitations.  Neurological: Positive for tremors.  All other systems reviewed and are negative.    Allergies  Acetaminophen; Aspirin; Cephalexin; Ibuprofen; and Augmentin  Home Medications   Current Outpatient Rx  Name  Route  Sig  Dispense  Refill  . aspirin EC 81 MG tablet   Oral   Take 81 mg by mouth at bedtime.         . calcium carbonate (TUMS - DOSED IN  MG ELEMENTAL CALCIUM) 500 MG chewable tablet   Oral   Chew 2 tablets by mouth 4 (four) times daily as needed for heartburn.         . diphenhydrAMINE (BENADRYL) 25 MG tablet   Oral   Take 25 mg by mouth every 6 (six) hours as needed for itching or allergies.         [name] estradiol (ESTRACE) 1 MG tablet   Oral   Take 0.5 mg by mouth at bedtime.          . fluticasone (FLONASE) 50 MCG/ACT nasal spray   Nasal   Place 2 sprays into the nose daily.         [name] loratadine (CLARITIN) 10 MG tablet   Oral   Take 10 mg by mouth daily as needed for allergies.         . pantoprazole (PROTONIX) 20 MG tablet   Oral   Take 20 mg by mouth daily.         . progesterone (PROMETRIUM) 100 MG capsule   Oral   Take 100 mg by  mouth at bedtime.          . ALPRAZolam (XANAX) 0.25 MG tablet   Oral   Take 1 tablet (0.25 mg total) by mouth 3 (three) times daily as needed for anxiety.   20 tablet   0   . EPINEPHrine (EPIPEN) 0.3 mg/0.3 mL DEVI   Intramuscular   Inject 0.3 mLs (0.3 mg total) into the muscle as needed.   2 Device   3     Triage Vitals: BP 154/69  Pulse 80  Temp(Src) 98.1 F (36.7 C) (Oral)  Resp 18  SpO2 98%  Physical Exam  Constitutional: She is oriented to person, place, and time. She appears well-developed and well-nourished.  Anxious appearing.  HENT:  Head: Normocephalic and atraumatic.  Mouth/Throat: Oropharynx is clear and moist and mucous membranes are normal. Mucous membranes are not dry.  Eyes: Conjunctivae and EOM are normal. Pupils are equal, round, and reactive to light.  Neck: Normal range of motion. Neck supple.  Cardiovascular: Normal rate, regular rhythm and normal heart sounds.   No murmur heard. Pulmonary/Chest: Effort normal and breath sounds normal. No respiratory distress. She has no wheezes. She has no rales.  Abdominal: Soft. There is no tenderness.  Musculoskeletal: Normal range of motion.  Moving all extremities without difficulty.  Neurological: She is alert and oriented to person, place, and time.  Mild, fine tremor. 5/5 motor strength in all extremities. Sensation fully intact.   Skin: Skin is warm and dry.    ED Course  Procedures (including critical care time) DIAGNOSTIC STUDIES: Oxygen Saturation is 98% on room air, normal by my interpretation.    COORDINATION OF CARE: 2:14 PM- Will order chest x-ray, UA, troponin, CBC and basic metabolic panel. Patient informed of current plan for treatment and evaluation and agrees with plan at this time.  5:51 PM- Patient is feeling better after Xanax 0.25 mg. Will stop taking PPI and aspirin. Agrees to follow up with PCP for consultation on what meds to continue.      Labs Reviewed  BASIC METABOLIC PANEL  - Abnormal; Notable for the following:    Glucose, Bld 108 (*)    GFR calc non Af Amer 82 (*)    All other components within normal limits  URINALYSIS, ROUTINE W REFLEX MICROSCOPIC - Abnormal; Notable for the following:    Ketones, ur 15 (*)  All other components within normal limits  CBC  TROPONIN I  POCT I-STAT TROPONIN I     Dg Chest 2 View  02/05/2013  *RADIOLOGY REPORT*  Clinical Data: Chest pain  CHEST - 2 VIEW  Comparison: 08/31/2011  Findings: The heart size is normal.  No pleural effusion or edema. No airspace consolidation identified.  The visualized osseous structures are unremarkable.  IMPRESSION:  1.  No acute cardiopulmonary abnormalities   Original Report Authenticated By: Signa Kell, M.D.      1. Palpitations       MDM  I personally performed the services described in this documentation, which was scribed in my presence. The recorded information has been reviewed and is accurate.    Kathryn Racer, MD 02/06/13 [phone]

## 2013-02-05 NOTE — Progress Notes (Signed)
Subjective:    Patient ID: [name], female    DOB: [birth year], 77 y.o.   MRN: [id number]  Chest Pain  This is a new problem. The current episode started in the past 7 days. The onset quality is gradual. The problem occurs constantly. The problem has been gradually worsening. The pain is present in the substernal region. The pain is at a severity of 2/10. The pain is mild. The quality of the pain is described as pressure. The pain radiates to the mid back. Associated symptoms include back pain, dizziness, exertional chest pressure, irregular heartbeat, malaise/fatigue, palpitations, shortness of breath and weakness. Pertinent negatives include no abdominal pain, claudication, cough, diaphoresis, fever, headaches, hemoptysis, leg pain, lower extremity edema, nausea, near-syncope, numbness, orthopnea, PND, sputum production, syncope or vomiting. The pain is aggravated by nothing. She has tried antacids for the symptoms. The treatment provided no relief. Risk factors include post-menopausal, hormone replacement therapy, being elderly and stress.  Pertinent negatives for past medical history include no seizures.  Her family medical history is significant for CAD in family.      Review of Systems  Constitutional: Positive for malaise/fatigue. Negative for fever and diaphoresis.  HENT: Negative.   Eyes: Negative.   Respiratory: Positive for shortness of breath. Negative for apnea, cough, hemoptysis, sputum production, choking, chest tightness, wheezing and stridor.   Cardiovascular: Positive for chest pain and palpitations. Negative for orthopnea, claudication, leg swelling, syncope, PND and near-syncope.  Gastrointestinal: Negative for nausea, vomiting, abdominal pain, diarrhea, constipation, abdominal distention and anal bleeding.  Endocrine: Negative.   Genitourinary: Negative.   Musculoskeletal: Positive for back pain. Negative for myalgias, joint swelling and gait problem.  Skin: Negative  for color change, pallor, rash and wound.  Allergic/Immunologic: Negative.   Neurological: Positive for dizziness and weakness. Negative for tremors, seizures, syncope, speech difficulty, light-headedness, numbness and headaches.  Hematological: Negative.   Psychiatric/Behavioral: The patient is nervous/anxious.        Objective:   Physical Exam  Vitals reviewed. Constitutional: She is oriented to person, place, and time. She appears well-developed and well-nourished. No distress.  HENT:  Head: Normocephalic and atraumatic.  Mouth/Throat: Oropharynx is clear and moist. No oropharyngeal exudate.  Eyes: Conjunctivae are normal. Right eye exhibits no discharge. Left eye exhibits no discharge. No scleral icterus.  Neck: Normal range of motion. Neck supple. No JVD present. No tracheal deviation present. No thyromegaly present.  Cardiovascular: Normal rate, regular rhythm, S1 normal, S2 normal, normal heart sounds and intact distal pulses.  Exam reveals no gallop, no S3, no S4, no distant heart sounds and no friction rub.   No murmur heard. Pulses:      Carotid pulses are 1+ on the right side, and 1+ on the left side.      Radial pulses are 1+ on the right side, and 1+ on the left side.       Femoral pulses are 1+ on the right side, and 1+ on the left side.      Popliteal pulses are 1+ on the right side, and 1+ on the left side.       Dorsalis pedis pulses are 1+ on the right side, and 1+ on the left side.       Posterior tibial pulses are 1+ on the right side, and 1+ on the left side.  Pulmonary/Chest: Effort normal and breath sounds normal. No stridor. No respiratory distress. She has no wheezes. She has no rales. She exhibits no tenderness.  Abdominal:  Soft. Bowel sounds are normal. She exhibits no distension and no mass. There is no tenderness. There is no rebound and no guarding.  Musculoskeletal: Normal range of motion. She exhibits no edema and no tenderness.  Lymphadenopathy:    She  has no cervical adenopathy.  Neurological: She is oriented to person, place, and time.  Skin: Skin is warm and dry. No rash noted. She is not diaphoretic. No erythema. No pallor.  Psychiatric: Her speech is normal and behavior is normal. Judgment and thought content normal. Her mood appears anxious. Her affect is not angry, not blunt, not labile and not inappropriate. Cognition and memory are normal. She does not exhibit a depressed mood.      Lab Results  Component Value Date   WBC 9.3 08/31/2011   HGB 14.3 08/31/2011   HCT 41.8 08/31/2011   PLT 227 08/31/2011   GLUCOSE 89 08/31/2011   CHOL 200 05/24/2011   TRIG 74.0 05/24/2011   HDL 65.00 05/24/2011   LDLDIRECT 210.1 05/25/2010   LDLCALC 120* 05/24/2011   ALT 14 05/24/2011   ALT 14 05/24/2011   AST 18 05/24/2011   AST 18 05/24/2011   NA 141 08/31/2011   K 3.5 08/31/2011   CL 103 08/31/2011   CREATININE 0.74 08/31/2011   BUN 10 08/31/2011   CO2 24 08/31/2011   TSH 1.89 05/25/2010   HGBA1C 5.7 05/24/2011      Assessment & Plan:

## 2013-02-05 NOTE — Telephone Encounter (Signed)
Patient Information:  Caller Name: Lavine  Phone: [phone]  Patient: [name]  Gender: Female  DOB: 1936/04/30  Age: 77 Years  PCP: Illene Regulus (Adults only)  Office Follow Up:  Does the office need to follow up with this patient?: No  Instructions For The Office: N/A  RN Note:  Patient states she was seen in office 02/04/13 and diagnosed with GERD. Patient states she was prescribed Pantoprazole. Patient states she took initial dosage of Pantoprazole at 1030 02/04/13 and did not have any problems. Patient states she awakened at approx. 0715 feeling "jittery," " nervous." States sx accompanied by dry mouth. Patient also states she is feeling "fluttering" in chest and had episode of "fullness" in her back upon awakening. Patient states she also experienced dizziness and "sweaty" with above sx. Patient states fullness in back and dizziness have resolved but "fluttering" in chest persists. Pulse rate 80 at present. Patient states it felt " a little hard to breathe" 02/05/13 a.m. but sx has resolved. Denies dyspnea, dizziness, chest pain at present.  Patient states sx are currently improved. Care advice given per guidelines. Patient advised to change positions slowly. Patient advised not to drive self. Call back parameters reviewed. Patient advised to call 911 if sx recur and persist or if chest pain develops or any increase in sx. Patient verbalizes understanding.  Symptoms  Reason For Call & Symptoms: Feeling "jittery"/nervous/dizziness Patient states she thinks she is having a reaction to Pantoprazole  Reviewed Health History In EMR: Yes  Reviewed Medications In EMR: Yes  Reviewed Allergies In EMR: Yes  Reviewed Surgeries / Procedures: Yes  Date of Onset of Symptoms: 02/05/2013  Guideline(s) Used:  Chest Pain  Disposition Per Guideline:   See Today in Office  Reason For Disposition Reached:   Intermittent chest pains persist > 3 days  Advice Given:  Call Back If:  Severe  chest pain  Constant chest pain lasting longer than 5 minutes  You become worse.  Appointment Scheduled:  02/05/2013 11:00:00 Appointment Scheduled Provider:  Sanda Linger (Adults only)

## 2013-02-06 ENCOUNTER — Encounter: Payer: Self-pay | Admitting: Internal Medicine

## 2013-02-06 NOTE — Assessment & Plan Note (Signed)
She has suspicious sounding chest pain and EKG changes (compared to prior EKG she appears to have ST depression in the lateral leads) I d/w her and her daughter my concerns and they have agreed to take her to [facility] ER to be evaluated for cardiac ischemia.

## 2013-02-11 ENCOUNTER — Ambulatory Visit: Payer: Medicare Other | Admitting: Internal Medicine

## 2013-02-18 ENCOUNTER — Encounter: Payer: Self-pay | Admitting: Internal Medicine

## 2013-02-18 ENCOUNTER — Ambulatory Visit (INDEPENDENT_AMBULATORY_CARE_PROVIDER_SITE_OTHER): Payer: Medicare Other | Admitting: Internal Medicine

## 2013-02-18 VITALS — BP 128/80 | HR 75 | Temp 97.7°F | Wt 131.0 lb

## 2013-02-18 DIAGNOSIS — F411 Generalized anxiety disorder: Secondary | ICD-10-CM | POA: Diagnosis not present

## 2013-02-18 DIAGNOSIS — R002 Palpitations: Secondary | ICD-10-CM | POA: Diagnosis not present

## 2013-02-18 MED ORDER — SERTRALINE HCL 25 MG PO TABS: 25.0000 mg | ORAL_TABLET | Freq: Every day | ORAL | Status: DC

## 2013-02-18 NOTE — Patient Instructions (Addendum)
1. Heart palpitations - nothing abnormal found at ED evaluation. However, in order to have a definitive evaluation .[name]... Plan 48 hour holter monitor. You will be called by cardiology [facility]) to be fitted with a monitor and given instructions for use         A report will be sent to me several days later.  2. Anxiety - this is a chronic problem in a difficult situation. Plan Sertraline 25 mg once a day. A SSRI medication that will "level" the emotional burden and prevent anxiety attacks  For a breakthrough anxiety attack it is ok to take Alprazolam.  3. 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

## 2013-02-19 ENCOUNTER — Encounter: Payer: Self-pay | Admitting: Internal Medicine

## 2013-02-19 DIAGNOSIS — R002 Palpitations: Secondary | ICD-10-CM

## 2013-02-19 DIAGNOSIS — F411 Generalized anxiety disorder: Secondary | ICD-10-CM

## 2013-02-19 HISTORY — DX: Palpitations: R00.2

## 2013-02-19 HISTORY — DX: Generalized anxiety disorder: F41.1

## 2013-02-19 NOTE — Assessment & Plan Note (Signed)
Continued problem, with associated anxiety. EKG reviewed - prolonged QT, question of flutter waves on rhythm strip.  Plan Refer for 48 hour holter for definitive diagnosis

## 2013-02-19 NOTE — Progress Notes (Signed)
Subjective:    Patient ID: [name], female    DOB: 10-Mar-1936, 77 y.o.   MRN: [id number]  HPI [name] presents for ED follow up. She had started ASA therapy and developed substernal burning pain. She was seen by [name] who prescribed PPI. After the first dose [name] had recurrent symptoms of palpitations and heightened anxiety. She returned and was seen by Dr. [name] who, due to abnormal EKG along with her symptoms, sent her to the ED. ED notes reviewed: she had normal troponin, normal chemistries, EKG that was read out as normal. She was given alprazolam which resolved her anxiety and her palpitations.   Since ED evaluation she has had several recurrent episodes of palpitations along with symptoms of anxiety for which she has taken Alprazolam. 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 Hemorrhoids, external   . Personal history of diseases of skin and subcutaneous tissue   . Other and unspecified hyperlipidemia   . Personal history of unspecified disease   . Need for prophylactic hormone replacement therapy (postmenopausal)   . Allergic rhinitis, cause unspecified   . Irritable bowel syndrome    Past Surgical History  Procedure Laterality Date  . Benign moles removed    . Sebaceous cyst excised    . Hemorrhoid surgery     Family History  Problem Relation Age of Onset  . Heart disease Father   . Cancer Sister     breast  . Heart disease Other   . Heart disease Other   . Heart disease Other   . Cancer Sister     breast  . Hypothyroidism Sister    History   Social History  . Marital Status: Married    Spouse Name: N/A    Number of Children: 4  . Years of Education: N/A   Occupational History  . retired    Social History Main Topics  . Smoking status: Former Games developer  . Smokeless tobacco: Never Used  .  Alcohol Use: Yes     Comment: rarely  . Drug Use: No  . Sexually Active: Yes -- Female partner(s)   Other Topics Concern  . Not on file   Social History Narrative   College grad. Married '61. 4 daughters, 2 grandchildren. Work - Furniture conservator/restorer mfg. - retired.    Current Outpatient Prescriptions on File Prior to Visit  Medication Sig Dispense Refill  . ALPRAZolam (XANAX) 0.25 MG tablet Take 1 tablet (0.25 mg total) by mouth 3 (three) times daily as needed for anxiety.  20 tablet  0  . aspirin EC 81 MG tablet Take 81 mg by mouth at bedtime.      . calcium carbonate (TUMS - DOSED IN MG ELEMENTAL CALCIUM) 500 MG chewable tablet Chew 2 tablets by mouth 4 (four) times daily as needed for heartburn.      . diphenhydrAMINE (BENADRYL) 25 MG tablet Take 25 mg by mouth every 6 (six) hours as needed for itching or allergies.      [name] EPINEPHrine (EPIPEN) 0.3 mg/0.3 mL DEVI Inject 0.3 mLs (0.3 mg total) into the muscle as needed.  2 Device  3  . estradiol (ESTRACE) 1 MG tablet Take 0.5 mg by mouth at bedtime.       . fluticasone (FLONASE) 50 MCG/ACT nasal spray Place 2 sprays into the nose  daily.      . loratadine (CLARITIN) 10 MG tablet Take 10 mg by mouth daily as needed for allergies.      . progesterone (PROMETRIUM) 100 MG capsule Take 100 mg by mouth at bedtime.        No current facility-administered medications on file prior to visit.      Review of Systems System review is negative for any constitutional, cardiac, pulmonary, GI or neuro symptoms or complaints other than as described in the HPI.     Objective:   Physical Exam Filed Vitals:   02/18/13 1505  BP: 128/80  Pulse: 75  Temp: 97.7 F (36.5 C)   Gen'l- WNWD white woman in no distress HEENT - C&S clear, PERRLA Cor- 2+ radial pulse, RRR with question of intermittent PVC Pulm - stable Pscyh - calm       Assessment & Plan:

## 2013-02-19 NOTE — Assessment & Plan Note (Signed)
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 She has had a good response to benzodiazepines.  Plan SSRI therapy with Sertraline 25 mg daily  Alprazolam for break through  Consider counseling if poor response or worsening symtpoms  Advised to seek dementia/alzheimer's support group.  ROV 1 month

## 2013-02-20 ENCOUNTER — Other Ambulatory Visit: Payer: Self-pay

## 2013-02-21 MED ORDER — ALPRAZOLAM 0.25 MG PO TABS: 0.2500 mg | ORAL_TABLET | Freq: Three times a day (TID) | ORAL | Status: DC | PRN

## 2013-02-21 NOTE — Telephone Encounter (Signed)
This medication has been called in to [facility].

## 2013-02-25 ENCOUNTER — Encounter: Payer: Self-pay | Admitting: Internal Medicine

## 2013-02-27 ENCOUNTER — Telehealth: Payer: Self-pay | Admitting: *Deleted

## 2013-02-27 ENCOUNTER — Encounter (INDEPENDENT_AMBULATORY_CARE_PROVIDER_SITE_OTHER): Payer: Medicare Other

## 2013-02-27 ENCOUNTER — Encounter: Payer: Self-pay | Admitting: Cardiology

## 2013-02-27 ENCOUNTER — Telehealth: Payer: Self-pay | Admitting: Internal Medicine

## 2013-02-27 DIAGNOSIS — R002 Palpitations: Secondary | ICD-10-CM

## 2013-02-27 MED ORDER — BUSPIRONE HCL 5 MG PO TABS: 5.0000 mg | ORAL_TABLET | Freq: Three times a day (TID) | ORAL | Status: DC

## 2013-02-27 NOTE — Telephone Encounter (Signed)
Responded to MyChart notes, a series, last was yesterday, 3/18 PM. Rx sent to pharmacy for Buspar to take 5 mg  three times a day.

## 2013-02-27 NOTE — Telephone Encounter (Signed)
[facility] Triage Call Report Triage Record Num: [id number] Operator: Maryfrances Bunnell Patient Name: [name] Call Date & Time: 02/26/2013 5:02:45PM Patient Phone: [phone] PCP: [name] Patient Gender: Female PCP Fax : [phone] Patient DOB: 17-Jun-1936 Practice Name: [facility] Reason for Call: Caller: [name]/Patient; PCP: [name] (Adults only); CB#: [id number]; Call regarding BP 161/100; Anxiety Issues; Patient calling due to increasing anxiety recently related to increasing stress of taking care of her husband and fear of the future. Has been trying different anxiety medications. Email today to Dr. Debby Kidd regarding stopping the Xanax and starting Buspar. Last Xanax was at 07 am 3/18. MD hasn't responded to email as of time of call. Advised to continue to monitor for email response. States BP at one point today was 161/100, but is now 139/86 and she does feel much calmer. Triaged per Anxiety protocol "Increasing physical symptoms related to anxiety" Care advice given. Will f/u with Dr. Debby Kidd via email. Protocol(s) Used: Anxiety Recommended Outcome per Protocol: See Provider within 72 Hours Reason for Outcome: Increasing physical symptoms related to anxiety Care Advice: Exercise, yoga, massage, relaxation, prayer, music, a hot bath, or journal writing are all ways that may help calm a person. Try these and find one or more that helps to relieve your anxiety and practice it when feeling stressed or worried. ~Share your concern with a professional counselor or an individual you feel can help you cope by listening objectivelyto your concerns. ~ Request information about local support groups that might be helpful for you. ~ SYMPTOM / CONDITION MANAGEMENT ~ COPING / BEHAVIOR MANAGEMENT Continue to follow your treatment plan until seeing or talking with your provider. Take all medications as prescribed. If you have questions or concerns about your treatment plan,  ask about them when you call for your appointment.

## 2013-02-27 NOTE — Telephone Encounter (Signed)
48 hr holter placed on Pt 02/27/13 TK

## 2013-02-28 ENCOUNTER — Ambulatory Visit (INDEPENDENT_AMBULATORY_CARE_PROVIDER_SITE_OTHER): Payer: Medicare Other | Admitting: Psychology

## 2013-02-28 DIAGNOSIS — F411 Generalized anxiety disorder: Secondary | ICD-10-CM

## 2013-03-01 DIAGNOSIS — R002 Palpitations: Secondary | ICD-10-CM | POA: Diagnosis not present

## 2013-03-07 ENCOUNTER — Ambulatory Visit (INDEPENDENT_AMBULATORY_CARE_PROVIDER_SITE_OTHER): Payer: Medicare Other | Admitting: Psychology

## 2013-03-07 DIAGNOSIS — F411 Generalized anxiety disorder: Secondary | ICD-10-CM | POA: Diagnosis not present

## 2013-03-13 ENCOUNTER — Telehealth: Payer: Self-pay | Admitting: Internal Medicine

## 2013-03-13 NOTE — Telephone Encounter (Signed)
Patient scheduled 03/20/13 @ 11:00

## 2013-03-13 NOTE — Telephone Encounter (Signed)
OK to restore appt for CPX or work her in. The report on the holter is not posted yet.

## 2013-03-13 NOTE — Telephone Encounter (Signed)
Left message for pt to call back and schedule

## 2013-03-13 NOTE — Telephone Encounter (Signed)
Patients physical was canceled by someone from Dr. Levie Heritage office on accident, patient was hoping to get the results of her heart monitor when she came in for that appointment, can you give the results over the phone or would you like patient worked in to see you?

## 2013-03-14 ENCOUNTER — Encounter: Payer: Medicare Other | Admitting: Internal Medicine

## 2013-03-15 ENCOUNTER — Ambulatory Visit: Payer: Medicare Other | Admitting: Psychology

## 2013-03-16 ENCOUNTER — Ambulatory Visit (INDEPENDENT_AMBULATORY_CARE_PROVIDER_SITE_OTHER): Payer: Medicare Other | Admitting: Family Medicine

## 2013-03-16 ENCOUNTER — Encounter: Payer: Self-pay | Admitting: Family Medicine

## 2013-03-16 VITALS — BP 120/72 | Temp 98.2°F | Wt 128.0 lb

## 2013-03-16 DIAGNOSIS — J020 Streptococcal pharyngitis: Secondary | ICD-10-CM

## 2013-03-16 DIAGNOSIS — L259 Unspecified contact dermatitis, unspecified cause: Secondary | ICD-10-CM

## 2013-03-16 DIAGNOSIS — L240 Irritant contact dermatitis due to detergents: Secondary | ICD-10-CM | POA: Diagnosis not present

## 2013-03-16 DIAGNOSIS — I10 Essential (primary) hypertension: Secondary | ICD-10-CM | POA: Diagnosis not present

## 2013-03-16 HISTORY — DX: Unspecified contact dermatitis, unspecified cause: L25.9

## 2013-03-16 LAB — POCT RAPID STREP A (OFFICE): Rapid Strep A Screen: NEGATIVE

## 2013-03-16 MED ORDER — METHYLPREDNISOLONE 4 MG PO KIT: PACK | ORAL | Status: DC

## 2013-03-16 NOTE — Assessment & Plan Note (Signed)
New detergent in past few weeks. Encouraged to stop this, may continue Claritin in am and Benadryl in pm and switch back to old detergent. Given Medrol dosepak to break rash. Did check a rapid strep due to a sore throat earlier in the week and it was negative, sore throat is now improved.

## 2013-03-16 NOTE — Assessment & Plan Note (Signed)
Well controlled despite rash, no changes

## 2013-03-16 NOTE — Patient Instructions (Addendum)
Between Benadryl and Claritin, 3 doses daily, max  Contact Dermatitis Contact dermatitis is a reaction to certain substances that touch the skin. Contact dermatitis can be either irritant contact dermatitis or allergic contact dermatitis. Irritant contact dermatitis does not require previous exposure to the substance for a reaction to occur.Allergic contact dermatitis only occurs if you have been exposed to the substance before. Upon a repeat exposure, your body reacts to the substance.  CAUSES  Many substances can cause contact dermatitis. Irritant dermatitis is most commonly caused by repeated exposure to mildly irritating substances, such as:  Makeup.  Soaps.  Detergents.  Bleaches.  Acids.  Metal salts, such as nickel. Allergic contact dermatitis is most commonly caused by exposure to:  Poisonous plants.  Chemicals (deodorants, shampoos).  Jewelry.  Latex.  Neomycin in triple antibiotic cream.  Preservatives in products, including clothing. SYMPTOMS  The area of skin that is exposed may develop:  Dryness or flaking.  Redness.  Cracks.  Itching.  Pain or a burning sensation.  Blisters. With allergic contact dermatitis, there may also be swelling in areas such as the eyelids, mouth, or genitals.  DIAGNOSIS  Your caregiver can usually tell what the problem is by doing a physical exam. In cases where the cause is uncertain and an allergic contact dermatitis is suspected, a patch skin test may be performed to help determine the cause of your dermatitis. TREATMENT Treatment includes protecting the skin from further contact with the irritating substance by avoiding that substance if possible. Barrier creams, powders, and gloves may be helpful. Your caregiver may also recommend:  Steroid creams or ointments applied 2 times daily. For best results, soak the rash area in cool water for 20 minutes. Then apply the medicine. Cover the area with a plastic wrap. You can store  the steroid cream in the refrigerator for a "chilly" effect on your rash. That may decrease itching. Oral steroid medicines may be needed in more severe cases.  Antibiotics or antibacterial ointments if a skin infection is present.  Antihistamine lotion or an antihistamine taken by mouth to ease itching.  Lubricants to keep moisture in your skin.  Burow's solution to reduce redness and soreness or to dry a weeping rash. Mix one packet or tablet of solution in 2 cups cool water. Dip a clean washcloth in the mixture, wring it out a bit, and put it on the affected area. Leave the cloth in place for 30 minutes. Do this as often as possible throughout the day.  Taking several cornstarch or baking soda baths daily if the area is too large to cover with a washcloth. Harsh chemicals, such as alkalis or acids, can cause skin damage that is like a burn. You should flush your skin for 15 to 20 minutes with cold water after such an exposure. You should also seek immediate medical care after exposure. Bandages (dressings), antibiotics, and pain medicine may be needed for severely irritated skin.  HOME CARE INSTRUCTIONS  Avoid the substance that caused your reaction.  Keep the area of skin that is affected away from hot water, soap, sunlight, chemicals, acidic substances, or anything else that would irritate your skin.  Do not scratch the rash. Scratching may cause the rash to become infected.  You may take cool baths to help stop the itching.  Only take over-the-counter or prescription medicines as directed by your caregiver.  See your caregiver for follow-up care as directed to make sure your skin is healing properly. SEEK MEDICAL CARE IF:  Your condition is not better after 3 days of treatment.  You seem to be getting worse.  You see signs of infection such as swelling, tenderness, redness, soreness, or warmth in the affected area.  You have any problems related to your medicines. Document  Released: 11/25/2000 Document Revised: 02/20/2012 Document Reviewed: 05/03/2011 [facility] Patient Information 2013 [place], [place].

## 2013-03-16 NOTE — Progress Notes (Signed)
[name] [id number] 04-15-1936 03/16/2013      Progress Note-Follow Up  Subjective  Chief Complaint  Chief Complaint  Patient presents with  . Rash    all over, sore throat, itchy     HPI  Patient is a 77 year old Caucasian female who is in today for pruritic rash. Earlier in the week she had a low-grade sore throat. She then developed some minor congestion postnasal drip. She then notes over the last couple of days she's had a diffuse raised, erythematous pruritic rash most notably on her trunk. She has started a new laundry detergent in the last 2 weeks. No other changes come to mind. She took Claritin and Benadryl over-the-counter and finds been marginally helpful but the rash is not resolving. No chest pain or palpitations. No shortness of breath or GI complaints noted today  Past Medical History  Diagnosis Date  . Hemorrhoids, external   . Personal history of diseases of skin and subcutaneous tissue   . Other and unspecified hyperlipidemia   . Personal history of unspecified disease   . Need for prophylactic hormone replacement therapy (postmenopausal)   . Allergic rhinitis, cause unspecified   . Irritable bowel syndrome   . Contact dermatitis 03/16/2013    Past Surgical History  Procedure Laterality Date  . Benign moles removed    . Sebaceous cyst excised    . Hemorrhoid surgery      Family History  Problem Relation Age of Onset  . Heart disease Father   . Cancer Sister     breast  . Heart disease Other   . Heart disease Other   . Heart disease Other   . Cancer Sister     breast  . Hypothyroidism Sister     History   Social History  . Marital Status: Married    Spouse Name: N/A    Number of Children: 4  . Years of Education: N/A   Occupational History  . retired    Social History Main Topics  . Smoking status: Former Games developer  . Smokeless tobacco: Never Used  . Alcohol Use: Yes     Comment: rarely  . Drug Use: No  . Sexually Active: Yes -- Female  partner(s)   Other Topics Concern  . Not on file   Social History Narrative   College grad. Married '61. 4 daughters, 2 grandchildren. Work - Furniture conservator/restorer mfg. - retired.    Current Outpatient Prescriptions on File Prior to Visit  Medication Sig Dispense Refill  . ALPRAZolam (XANAX) 0.25 MG tablet Take 1 tablet (0.25 mg total) by mouth 3 (three) times daily as needed for anxiety.  20 tablet  0  . aspirin EC 81 MG tablet Take 81 mg by mouth at bedtime.      . busPIRone (BUSPAR) 5 MG tablet Take 1 tablet (5 mg total) by mouth 3 (three) times daily.  90 tablet  2  . calcium carbonate (TUMS - DOSED IN MG ELEMENTAL CALCIUM) 500 MG chewable tablet Chew 2 tablets by mouth 4 (four) times daily as needed for heartburn.      . diphenhydrAMINE (BENADRYL) 25 MG tablet Take 25 mg by mouth every 6 (six) hours as needed for itching or allergies.      [name] EPINEPHrine (EPIPEN) 0.3 mg/0.3 mL DEVI Inject 0.3 mLs (0.3 mg total) into the muscle as needed.  2 Device  3  . estradiol (ESTRACE) 1 MG tablet Take 0.5 mg by mouth at bedtime.       [name]  fluticasone (FLONASE) 50 MCG/ACT nasal spray Place 2 sprays into the nose daily.      [name] loratadine (CLARITIN) 10 MG tablet Take 10 mg by mouth daily as needed for allergies.      . progesterone (PROMETRIUM) 100 MG capsule Take 100 mg by mouth at bedtime.       . sertraline (ZOLOFT) 25 MG tablet Take 1 tablet (25 mg total) by mouth daily.  30 tablet  5   No current facility-administered medications on file prior to visit.    Allergies  Allergen Reactions  . Acetaminophen     REACTION: rash  . Aspirin     REACTION: rash  . Cephalexin     REACTION: diaphoretic and drop in Blood pressure  . Ibuprofen     REACTION: rash  . Augmentin (Amoxicillin-Pot Clavulanate) Diarrhea and Rash    Review of Systems  Review of Systems  Constitutional: Negative for fever and malaise/fatigue.  HENT: Positive for congestion and sore throat.   Eyes: Negative for discharge.   Respiratory: Negative for shortness of breath.   Cardiovascular: Negative for chest pain, palpitations and leg swelling.  Gastrointestinal: Negative for nausea, abdominal pain and diarrhea.  Genitourinary: Negative for dysuria.  Musculoskeletal: Negative for falls.  Skin: Positive for itching and rash.  Neurological: Negative for loss of consciousness and headaches.  Endo/Heme/Allergies: Negative for polydipsia.  Psychiatric/Behavioral: Negative for depression and suicidal ideas. The patient is not nervous/anxious and does not have insomnia.     Objective  BP 120/72  Temp(Src) 98.2 F (36.8 C) (Oral)  Wt 128 lb (58.06 kg)  BMI 22.68 kg/m2  Physical Exam  Physical Exam  Constitutional: She is oriented to person, place, and time and well-developed, well-nourished, and in no distress. No distress.  HENT:  Head: Normocephalic and atraumatic.  Eyes: Conjunctivae are normal.  Neck: Neck supple. No thyromegaly present.  Cardiovascular: Normal rate, regular rhythm and normal heart sounds.   No murmur heard. Pulmonary/Chest: Effort normal and breath sounds normal. She has no wheezes.  Abdominal: She exhibits no distension and no mass.  Musculoskeletal: She exhibits no edema.  Lymphadenopathy:    She has no cervical adenopathy.  Neurological: She is alert and oriented to person, place, and time.  Skin: Skin is warm and dry. No rash noted. She is not diaphoretic.  Psychiatric: Memory, affect and judgment normal.    Lab Results  Component Value Date   TSH 1.89 05/25/2010   Lab Results  Component Value Date   WBC 6.5 02/05/2013   HGB 14.3 02/05/2013   HCT 40.1 02/05/2013   MCV 87.4 02/05/2013   PLT 237 02/05/2013   Lab Results  Component Value Date   CREATININE 0.72 02/05/2013   BUN 10 02/05/2013   NA 140 02/05/2013   K 3.8 02/05/2013   CL 104 02/05/2013   CO2 24 02/05/2013   Lab Results  Component Value Date   ALT 14 05/24/2011   ALT 14 05/24/2011   AST 18 05/24/2011   AST 18  05/24/2011   ALKPHOS 55 05/24/2011   ALKPHOS 55 05/24/2011   BILITOT 1.5* 05/24/2011   BILITOT 1.5* 05/24/2011   Lab Results  Component Value Date   CHOL 200 05/24/2011   Lab Results  Component Value Date   HDL 65.00 05/24/2011   Lab Results  Component Value Date   LDLCALC 120* 05/24/2011   Lab Results  Component Value Date   TRIG 74.0 05/24/2011   Lab Results  Component Value Date  CHOLHDL 3 05/24/2011     Assessment & Plan  Contact dermatitis New detergent in past few weeks. Encouraged to stop this, may continue Claritin in am and Benadryl in pm and switch back to old detergent. Given Medrol dosepak to break rash. Did check a rapid strep due to a sore throat earlier in the week and it was negative, sore throat is now improved.   Hypertension Well controlled despite rash, no changes

## 2013-03-20 ENCOUNTER — Ambulatory Visit (INDEPENDENT_AMBULATORY_CARE_PROVIDER_SITE_OTHER): Payer: Medicare Other | Admitting: Internal Medicine

## 2013-03-20 ENCOUNTER — Encounter: Payer: Self-pay | Admitting: Internal Medicine

## 2013-03-20 VITALS — BP 134/74 | HR 56 | Temp 97.3°F | Resp 12 | Ht 63.5 in | Wt 126.4 lb

## 2013-03-20 DIAGNOSIS — I10 Essential (primary) hypertension: Secondary | ICD-10-CM

## 2013-03-20 DIAGNOSIS — F411 Generalized anxiety disorder: Secondary | ICD-10-CM | POA: Diagnosis not present

## 2013-03-20 DIAGNOSIS — L259 Unspecified contact dermatitis, unspecified cause: Secondary | ICD-10-CM | POA: Diagnosis not present

## 2013-03-20 DIAGNOSIS — Z0000 Encounter for general adult medical examination without abnormal findings: Secondary | ICD-10-CM | POA: Diagnosis not present

## 2013-03-20 DIAGNOSIS — J309 Allergic rhinitis, unspecified: Secondary | ICD-10-CM | POA: Diagnosis not present

## 2013-03-20 DIAGNOSIS — E785 Hyperlipidemia, unspecified: Secondary | ICD-10-CM | POA: Diagnosis not present

## 2013-03-20 NOTE — Patient Instructions (Addendum)
Thanks for coming in to see me.  Full report to follow.

## 2013-03-20 NOTE — Progress Notes (Signed)
Subjective:    Patient ID: [name], female    DOB: 19-Nov-1936, 77 y.o.   MRN: [id number]  HPI The patient is here for annual Medicare wellness examination and management of other chronic and acute problems.  Was seen by Dr. Rogelia Rohrer Saturday, April 5th for an allergic reaction - contact dermatitis. She had previously, in the last several months, had rash that she had associated with alprazolam and sertraline. She has also had an allergic reaction (?) augmentin after cat bite. Needs allergy evaluation.  She saw Dr. Henderson Cloud in October '13 - normal exam. She is current with mammography.  IN regard to anxiety she failed alprazolam and sertraline due to rash. She did see Dr. Dellia Cloud for two visits. Since then she has resumed exercise and generally is feeling better.    The risk factors are reflected in the social history.  The roster of all physicians providing medical care to patient - is listed in the Snapshot section of the chart.  Activities of daily living:  The patient is 100% inedpendent in all ADLs: dressing, toileting, feeding as well as independent mobility  Home safety : The patient has smoke detectors in the home. They wear seatbelts. No firearms at home  There is no violence in the home.   There is no risks for hepatitis, STDs or HIV. There is no   history of blood transfusion. They have no travel history to infectious disease endemic areas of the world.  The patient has seen their dentist in the last six month. They have seen their eye doctor in the last year. They deny any hearing difficulty and have not had audiologic testing in the last year.    They do not  have excessive sun exposure. Discussed the need for sun protection: hats, long sleeves and use of sunscreen if there is significant sun exposure.   Diet: the importance of a healthy diet is discussed. They do have a healthy  diet.  The patient has a regular exercise program: Y - with a wellness counselor, 60 min  duration, 6 per week.  The benefits of regular aerobic exercise were discussed.  Depression screen: there are no signs or vegative symptoms of depression- irritability, change in appetite, anhedonia, sadness/tearfullness. Had marked anxiety improved with exercise and counseling.  Cognitive assessment: the patient manages all their financial and personal affairs and is actively engaged.  The following portions of the patient's history were reviewed and updated as appropriate: allergies, current medications, past family history, past medical history,  past surgical history, past social history  and problem list.  Vision, hearing, body mass index were assessed and reviewed.   During the course of the visit the patient was educated and counseled about appropriate screening and preventive services including : fall prevention , diabetes screening, nutrition counseling, colorectal cancer screening, and recommended immunizations.    Review of Systems Constitutional:  Negative for fever, chills, activity change and unexpected weight change.  HEENT:  Negative for hearing loss, ear pain, congestion, neck stiffness and postnasal drip. Negative for sore throat or swallowing problems. Negative for dental complaints.   Eyes: Negative for vision loss or change in visual acuity.  Respiratory: Negative for chest tightness and wheezing. Negative for DOE.   Cardiovascular: Negative for chest pain or palpitations. No decreased exercise tolerance Gastrointestinal: No change in bowel habit. No bloating or gas. No reflux or indigestion Genitourinary: Negative for urgency, frequency, flank pain and difficulty urinating.  Musculoskeletal: Negative for myalgias, back pain, arthralgias  and gait problem.  Neurological: Negative for dizziness, tremors, weakness and headaches.  Hematological: Negative for adenopathy.  Psychiatric/Behavioral: Negative for behavioral problems and dysphoric mood.       Objective:    Physical Exam Filed Vitals:   03/20/13 1105  BP: 134/74  Pulse: 56  Temp: 97.3 F (36.3 C)  Resp: 12   Wt Readings from Last 3 Encounters:  03/20/13 126 lb 6.4 oz (57.335 kg)  03/16/13 128 lb (58.06 kg)  02/18/13 131 lb (59.421 kg)   Gen'l: well nourished, well developed white Woman in no distress HEENT - /AT, EACs/TMs normal, oropharynx with native dentition in good condition, no buccal or palatal lesions, posterior pharynx clear, mucous membranes moist. C&S clear, PERRLA, fundi - normal Neck - supple, no thyromegaly Nodes- negative submental, cervical, supraclavicular regions Chest - no deformity, no CVAT Lungs - clear without rales, wheezes. No increased work of breathing Breast - deferred to gyn Cardiovascular - regular rate and rhythm, quiet precordium, no murmurs, rubs or gallops, 2+ radial, DP and PT pulses Abdomen - BS+ x 4, no HSM, no guarding or rebound or tenderness Pelvic - deferred to gyn Rectal - deferred to gyn Extremities - no clubbing, cyanosis, edema or deformity.  Neuro - A&O x 3, CN II-XII normal, motor strength normal and equal, DTRs 2+ and symmetrical biceps, radial, and patellar tendons. Cerebellar - no tremor, no rigidity, fluid movement and normal gait. Derm - Head, neck, back, abdomen and extremities without suspicious lesions  Lab Results  Component Value Date   WBC 6.5 02/05/2013   HGB 14.3 02/05/2013   HCT 40.1 02/05/2013   PLT 237 02/05/2013   GLUCOSE 108* 02/05/2013   CHOL 200 05/24/2011   TRIG 74.0 05/24/2011   HDL 65.00 05/24/2011   LDLDIRECT 210.1 05/25/2010   LDLCALC 120* 05/24/2011   ALT 14 05/24/2011   ALT 14 05/24/2011   AST 18 05/24/2011   AST 18 05/24/2011   NA 140 02/05/2013   K 3.8 02/05/2013   CL 104 02/05/2013   CREATININE 0.72 02/05/2013   BUN 10 02/05/2013   CO2 24 02/05/2013   TSH 1.89 05/25/2010   HGBA1C 5.7 05/24/2011           Assessment & Plan:

## 2013-03-22 ENCOUNTER — Encounter: Payer: Self-pay | Admitting: Internal Medicine

## 2013-03-22 ENCOUNTER — Ambulatory Visit (INDEPENDENT_AMBULATORY_CARE_PROVIDER_SITE_OTHER): Payer: Medicare Other | Admitting: Internal Medicine

## 2013-03-22 ENCOUNTER — Other Ambulatory Visit: Payer: Self-pay | Admitting: Internal Medicine

## 2013-03-22 ENCOUNTER — Other Ambulatory Visit: Payer: Medicare Other

## 2013-03-22 ENCOUNTER — Ambulatory Visit: Payer: Medicare Other

## 2013-03-22 VITALS — BP 114/70 | HR 64 | Ht 62.75 in | Wt 127.2 lb

## 2013-03-22 DIAGNOSIS — Z888 Allergy status to other drugs, medicaments and biological substances status: Secondary | ICD-10-CM | POA: Diagnosis not present

## 2013-03-22 DIAGNOSIS — R21 Rash and other nonspecific skin eruption: Secondary | ICD-10-CM | POA: Diagnosis not present

## 2013-03-22 DIAGNOSIS — L2089 Other atopic dermatitis: Secondary | ICD-10-CM

## 2013-03-22 DIAGNOSIS — L209 Atopic dermatitis, unspecified: Secondary | ICD-10-CM

## 2013-03-22 DIAGNOSIS — Z889 Allergy status to unspecified drugs, medicaments and biological substances status: Secondary | ICD-10-CM

## 2013-03-22 NOTE — Assessment & Plan Note (Signed)
Currently stable. Anticipate more symptoms as the spring progresses. As her symptoms increase she will resume the use of Fluticasone nasal spray

## 2013-03-22 NOTE — Progress Notes (Signed)
03/22/13- 44 yoF former smoker referred courtesy of Dr Debby Bud for allergy evaluation with hx of medication allergy and rash. In 2003 she associated cephalexin with diaphoresis, weakness and hypotension. In late when for a 2014 she associated Augmentin with rash, clindamycin with diarrhea. Alprazolam taken for 3 days caused rash and pantoprazole is associated with palpitation. A single pill of sertraline was associated with rash. She carries an EpiPen after yellow jacket sting in 2008 caused large local reaction. She also considers his self sensitive to aspirin .She is stressed by dealing with her husband and has been treated for anxiety. One week prior to this admission she had sore throat and postnasal drainage so she took Claritin. One day later she again had rash on prednisone. Or rash usually appears as a mottled red, not raised rash which comes and goes over large areas of her body. 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 A sister has allergies and asthma.  Prior to Admission medications   Medication Sig Start Date End Date Taking? Authorizing Provider  diphenhydrAMINE (BENADRYL) 25 MG tablet Take 25 mg by mouth every 6 (six) hours as needed for itching or allergies.   Yes Historical Provider, MD  EPINEPHrine (EPIPEN) 0.3 mg/0.3 mL DEVI Inject 0.3 mLs (0.3 mg total) into the muscle as needed. 05/25/11  Yes Jacques Navy, MD  estradiol (ESTRACE) 1 MG tablet Take 0.5 mg by mouth at bedtime.    Yes Historical Provider, MD  fluticasone (FLONASE) 50 MCG/ACT nasal spray Place 2 sprays into the nose daily.   Yes Historical Provider, MD  loratadine (CLARITIN) 10 MG tablet Take 10 mg by mouth daily as needed for allergies.   Yes Historical Provider, MD  progesterone (PROMETRIUM) 100 MG capsule Take 100 mg by mouth at bedtime.    Yes Historical Provider, MD  aspirin EC 81 MG tablet Take 81 mg by mouth at bedtime.    Historical Provider, MD  busPIRone (BUSPAR)  5 MG tablet Take 1 tablet (5 mg total) by mouth 3 (three) times daily. 02/27/13   Jacques Navy, MD  calcium carbonate (TUMS - DOSED IN MG ELEMENTAL CALCIUM) 500 MG chewable tablet Chew 2 tablets by mouth 4 (four) times daily as needed for heartburn.    Historical Provider, MD   Past Medical History  Diagnosis Date  . Hemorrhoids, external   . Personal history of diseases of skin and subcutaneous tissue   . Other and unspecified hyperlipidemia   . Personal history of unspecified disease   . Need for prophylactic hormone replacement therapy (postmenopausal)   . Allergic rhinitis, cause unspecified   . Irritable bowel syndrome   . Contact dermatitis 03/16/2013   Past Surgical History  Procedure Laterality Date  . Benign moles removed    . Sebaceous cyst excised    . Hemorrhoid surgery    . Cataract extraction w/ intraocular lens implant Bilateral     right - Nov '11, left - Nov '13 [facility]   Family History  Problem Relation Age of Onset  . Heart disease Father   . Cancer Sister     breast  . Heart disease Other   . Heart disease Other   . Heart disease Other   . Cancer Sister     breast  . Hypothyroidism Sister   . Asthma Sister   . Rheum arthritis Paternal Grandmother   . Rheum arthritis Daughter    History   Social History  .  Marital Status: Married    Spouse Name: N/A    Number of Children: 4  . Years of Education: N/A   Occupational History  . retired    Social History Main Topics  . Smoking status: Former Smoker -- 0.10 packs/day for 1 years    Types: Cigarettes    Quit date: 12/12/1962  . Smokeless tobacco: Never Used  . Alcohol Use: Yes     Comment: rarely  . Drug Use: No  . Sexually Active: Yes -- Female partner(s)   Other Topics Concern  . Not on file   Social History Narrative   College grad. Married '61. 4 daughters, 2 grandchildren. Work - Furniture conservator/restorer mfg. - retired.   ROS-see HPI Constitutional:   No-   weight loss, night  sweats, fevers, chills, fatigue, lassitude. HEENT:   No-  headaches, difficulty swallowing, tooth/dental problems, sore throat,       No-  sneezing, itching, ear ache, nasal congestion, post nasal drip,  CV:  No-   chest pain, orthopnea, PND, swelling in lower extremities, anasarca,                                  dizziness, palpitations Resp: No-   shortness of breath with exertion or at rest.              No-   productive cough,  No non-productive cough,  No- coughing up of blood.              No-   change in color of mucus.  No- wheezing.   Skin: No- Current  rash or lesions. GI:  No-   heartburn, indigestion, abdominal pain, nausea, vomiting, diarrhea,                 change in bowel habits, loss of appetite GU: No-   dysuria, change in color of urine, no urgency or frequency.  No- flank pain. MS:  No-   joint pain or swelling.  No- decreased range of motion.  No- back pain. Neuro-     nothing unusual Psych:  No- change in mood or affect. No depression or anxiety.  No memory loss.  OBJ- Physical Exam General- Alert, Oriented, Affect-appropriate, Distress- none acute. Trim, looks well. Skin- rash-none, lesions- none, excoriation- none Lymphadenopathy- none Head- atraumatic            Eyes- Gross vision intact, PERRLA, conjunctivae and secretions clear            Ears- Hearing, canals-normal            Nose- Clear, no-Septal dev, mucus, polyps, erosion, perforation             Throat- Mallampati II , mucosa clear , drainage- none, tonsils- atrophic Neck- flexible , trachea midline, no stridor , thyroid nl, carotid no bruit Chest - symmetrical excursion , unlabored           Heart/CV- RRR , no murmur , no gallop  , no rub, nl s1 s2                           - JVD- none , edema- none, stasis changes- none, varices- none           Lung- clear to P&A, wheeze- none, cough- none , dullness-none, rub- none  Chest wall-  Abd- tender-no, distended-no, bowel sounds-present, HSM-  no Br/ Gen/ Rectal- Not done, not indicated Extrem- cyanosis- none, clubbing, none, atrophy- none, strength- nl Neuro- grossly intact to observation

## 2013-03-22 NOTE — Assessment & Plan Note (Signed)
Last LDL in '12 was 210!!  Plan - Will need to address at routine follow up: recheck lab, consider non-statin therapy.

## 2013-03-22 NOTE — Patient Instructions (Addendum)
Order- lab- Allergy Profile, Food IgE allergy profile,     Dx drug allergy, atopic dermatitis                    Amoxacillin IgE  87114                    Ampicillin IgE 87112                    Penicilloyl G IgE 81387                    Penicilloyl V IgE 16109                    Aspirin     87210                    Ibuprofen 60454

## 2013-03-22 NOTE — Assessment & Plan Note (Signed)
Interval history significant for multiple episodes of rash and some problems with anxiety. Limited physical exam is normal. Labs reviewed from previous testing - in normal range except for lipids. She is current with colorectal cancer screening. She is due for breast cancer screening, e.g. Mammography.She is current with her gynecologist. Immunizations - due for pneumonia and shingles vaccine.   In summary - a pleasant woman who is doing much better having resumed regular exercise. She will need to return for better lipid management.

## 2013-03-22 NOTE — Assessment & Plan Note (Signed)
Patient with outbreak to antibiotic, benzodiazepine, SSRI, non-sedating antihistamine, regular anti-histamines. The variety of possible associated drug reactions raises question of non-medication allergies vs atopic dermatitis. She does respond to steroids.  PLan - referral to Dr. Maple Hudson for allergy testing  May need dermatology consult at the next outbreak.

## 2013-03-22 NOTE — Assessment & Plan Note (Signed)
For the past several months anxiety has been a major problem. She was intolerant of benzodiazepines and SSRIs. Counseling at [facility] was very helpful as has been physical exercise. She reports she is doing much better,.  Plan Continue exercise  Increase external, non-care giver activities  See Dr. Dellia Cloud as needed.

## 2013-03-22 NOTE — Assessment & Plan Note (Signed)
BP Readings from Last 3 Encounters:  03/20/13 134/74  03/16/13 120/72  02/18/13 128/80   Great control over the past several months  Plan Continue physical activity.

## 2013-03-25 LAB — ALLERGEN EGG WHITE F1: Egg White IgE: <0.1 kU/L

## 2013-03-25 LAB — ALLERGY FULL PROFILE
Allergen, D pternoyssinus,d7: <0.1 kU/L
Alternaria Alternata: <0.1 kU/L
Aspergillus fumigatus, m3: <0.1 kU/L
Bahia Grass: <0.1 kU/L
Bermuda Grass: <0.1 kU/L
Candida Albicans: <0.1 kU/L
Cat Dander: <0.1 kU/L
Curvularia lunata: <0.1 kU/L
D. farinae: <0.1 kU/L
Elm IgE: <0.1 kU/L
G005 Rye, Perennial: <0.1 kU/L
G009 Red Top: <0.1 kU/L
IgE (Immunoglobulin E), Serum: 4.2 IU/mL (ref 0.0–180.0)
Lamb's Quarters: <0.1 kU/L
Oak: <0.1 kU/L
Sycamore Tree: <0.1 kU/L
Timothy Grass: <0.1 kU/L

## 2013-03-25 LAB — ALLERGEN, FISH, COD F3: Fish Cod: <0.1 kU/L

## 2013-03-25 LAB — ALLERGEN, TOMATO F25: Tomato IgE: <0.1 kU/L

## 2013-03-25 LAB — ALLERGEN, CHICKEN F83: Chicken IgE: <0.1 kU/L

## 2013-03-25 LAB — ALLERGEN, TUNA F40: Tuna IgE: <0.1 kU/L

## 2013-03-25 LAB — ALLERGEN, APPLE F49: Apple: <0.1 kU/L

## 2013-03-25 LAB — ALLERGEN, ORANGE F33: Orange: <0.1 kU/L

## 2013-03-26 ENCOUNTER — Telehealth: Payer: Self-pay | Admitting: Internal Medicine

## 2013-03-26 NOTE — Telephone Encounter (Signed)
[facility] CALLING BACK  [phone] EXT [phone]  MELISSA

## 2013-03-26 NOTE — Telephone Encounter (Signed)
ATC was on hold x 5 min wcb

## 2013-03-27 ENCOUNTER — Telehealth: Payer: Self-pay | Admitting: Internal Medicine

## 2013-03-27 LAB — SPECIMEN STATUS REPORT

## 2013-03-27 NOTE — Telephone Encounter (Signed)
LMTCB

## 2013-03-27 NOTE — Telephone Encounter (Signed)
[employer], spoke with [employer]. They have a serum gel and a lavender top there -- wanting to know what to do with this.  ? What labs need to be done. I see some results have already been resulted, some are preliminary, and some are still "collected." Spoke with Katie.  She is requesting msg be sent to her to handle. Katie, pls advise and thank you.

## 2013-03-27 NOTE — Telephone Encounter (Signed)
LMTCB- nit sure what Melissa is needing-need clarification.

## 2013-03-28 LAB — ALLERGEN AMOXICILLIN: Amoxicillin IgE Class: 0

## 2013-03-28 NOTE — Telephone Encounter (Signed)
Another message taken about this on patient. CY responded and triage has handled this matter-please refer to 4-16/17-2014 message for more information. Thanks.

## 2013-03-28 NOTE — Telephone Encounter (Signed)
Called left detailed message on [name]' named voice mail informing his that [facility] is aware that the Ibuprofen IgE cannot be done Asked [name] to call back just to let us know that he has received the message

## 2013-03-28 NOTE — Telephone Encounter (Signed)
I spoke with james. He stated that we ordered an ibuprofen IGE test on pt. This is no longer available to order and other lab companies does not offer this as well. Please advise Dr. Maple Hudson thanks

## 2013-03-30 NOTE — Assessment & Plan Note (Signed)
She associates rash with a series of medications, mostly antibiotics. It is hard to tell from her history whether any of this is a direct connection coincidence. Her history of anxiety may be related. Plan-CBC, allergy profile, food allergy profile, specific IgE for amoxicillin, ampicillin, aspirin, penicillin. Tests are not available for the other medications she questions.

## 2013-04-03 ENCOUNTER — Ambulatory Visit: Payer: Medicare Other | Admitting: Internal Medicine

## 2013-04-03 LAB — ALLERGEN ASPIRIN/SALICYLIC ACID IGE

## 2013-05-28 ENCOUNTER — Ambulatory Visit (INDEPENDENT_AMBULATORY_CARE_PROVIDER_SITE_OTHER): Payer: Medicare Other | Admitting: Internal Medicine

## 2013-05-28 ENCOUNTER — Encounter: Payer: Self-pay | Admitting: Internal Medicine

## 2013-05-28 VITALS — BP 110/76 | HR 65 | Ht 62.75 in | Wt 131.0 lb

## 2013-05-28 DIAGNOSIS — R21 Rash and other nonspecific skin eruption: Secondary | ICD-10-CM | POA: Diagnosis not present

## 2013-05-28 NOTE — Progress Notes (Signed)
03/22/13- 38 yoF former smoker referred courtesy of Dr Debby Bud for allergy evaluation with hx of medication allergy and rash. In 2003 she associated cephalexin with diaphoresis, weakness and hypotension. In late when for a 2014 she associated Augmentin with rash, clindamycin with diarrhea. Alprazolam taken for 3 days caused rash and pantoprazole is associated with palpitation. A single pill of sertraline was associated with rash. She carries an EpiPen after yellow jacket sting in 2008 caused large local reaction. She also considers his self sensitive to aspirin .She is stressed by dealing with her husband and has been treated for anxiety. One week prior to this admission she had sore throat and postnasal drainage so she took Claritin. One day later she again had rash on prednisone. Her rash usually appears as a mottled red, not raised rash which comes and goes over large areas of her body. 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 A sister has allergies and asthma.  05/28/13- 50 yoF former smoker followed for question of medication allergies and rash. FOLLOWS ZOX:WRUEAVW states she has been doing well and changed her laundry powders as well. No rash or. Less anxious about going back to the gym. Still on hormones. Allergy profile 03/22/2013-negative-total IgE 4.2 with no specific allergen elevations. Medication IgE: Amoxicillin negative, ampicillin negative aspirin negative, shrimp, peanut negative, wheat negative, fish negative, apple negative, Tuna negative, orange negative, tomato negative, chicken negative. Medications she associates with rash:ofloxacin, penicillin, keflex, amoxicillin, alprazolam, lisinopril, BuSpar, Zoloft.   ROS-see HPI Constitutional:   No-   weight loss, night sweats, fevers, chills, fatigue, lassitude. HEENT:   No-  headaches, difficulty swallowing, tooth/dental problems, sore throat,       No-  sneezing, itching, ear ache, nasal  congestion, post nasal drip,  CV:  No-   chest pain, orthopnea, PND, swelling in lower extremities, anasarca,                                  dizziness, palpitations Resp: No-   shortness of breath with exertion or at rest.              No-   productive cough,  No non-productive cough,  No- coughing up of blood.              No-   change in color of mucus.  No- wheezing.   Skin: No- Current  rash or lesions. GI:  No-   heartburn, indigestion, abdominal pain, nausea, vomiting, diarrhea,                 change in bowel habits, loss of appetite GU: No-   dysuria, change in color of urine,  MS:  No-   joint pain or swelling.  . Neuro-     nothing unusual Psych:  No- change in mood or affect. No depression or anxiety.  No memory loss.  OBJ- Physical Exam General- Alert, Oriented, Affect-appropriate, Distress- none acute. Trim, looks well. Skin- rash-none, lesions- none, excoriation- none Lymphadenopathy- none Head- atraumatic            Eyes- Gross vision intact, PERRLA, conjunctivae and secretions clear            Ears- Hearing, canals-normal            Nose- Clear, no-Septal dev, mucus, polyps, erosion, perforation             Throat- Mallampati  II , mucosa clear , drainage- none, tonsils- atrophic Neck- flexible , trachea midline, no stridor , thyroid nl, carotid no bruit Chest - symmetrical excursion , unlabored           Heart/CV- RRR , no murmur , no gallop  , no rub, nl s1 s2                           - JVD- none , edema- none, stasis changes- none, varices- none           Lung- clear to P&A, wheeze- none, cough- none , dullness-none, rub- none           Chest wall-  Abd-  Br/ Gen/ Rectal- Not done, not indicated Extrem- cyanosis- none, clubbing, none, atrophy- none, strength- nl Neuro- grossly intact to observation

## 2013-05-28 NOTE — Patient Instructions (Addendum)
You did not demonstrate elevated allergy antibody levels, including to amoxacillin. Stil, it makes sense to avoid what has seemed to cause problems in the past.  We think you can tolerate doxycycline, clindamycin and the macrolides (zithromax, erythromycin, biaxin)  I will be happy to see you again as needed

## 2013-06-07 NOTE — Progress Notes (Signed)
Quick Note:  Pt seen on 05-28-13 by CY and results reviewed in detail with patient. Will sign off on results. ______

## 2013-06-11 NOTE — Assessment & Plan Note (Signed)
We cannot demonstrate elevated IgE levels to any common allergens. This does not rule out an allergic response entirely but makes an immediate anaphylactic reaction very unlikely. She will keep watching situations that might trigger rash. She will avoid foods that she learns cause problems, but is otherwise unrestricted

## 2013-07-17 ENCOUNTER — Other Ambulatory Visit: Payer: Self-pay

## 2013-08-24 DIAGNOSIS — Z23 Encounter for immunization: Secondary | ICD-10-CM | POA: Diagnosis not present

## 2013-09-24 ENCOUNTER — Ambulatory Visit (INDEPENDENT_AMBULATORY_CARE_PROVIDER_SITE_OTHER): Payer: Medicare Other | Admitting: *Deleted

## 2013-09-24 DIAGNOSIS — Z23 Encounter for immunization: Secondary | ICD-10-CM

## 2013-09-24 DIAGNOSIS — Z2911 Encounter for prophylactic immunotherapy for respiratory syncytial virus (RSV): Secondary | ICD-10-CM | POA: Diagnosis not present

## 2013-10-09 DIAGNOSIS — N951 Menopausal and female climacteric states: Secondary | ICD-10-CM | POA: Diagnosis not present

## 2013-10-09 DIAGNOSIS — Z1231 Encounter for screening mammogram for malignant neoplasm of breast: Secondary | ICD-10-CM | POA: Diagnosis not present

## 2013-10-15 DIAGNOSIS — D235 Other benign neoplasm of skin of trunk: Secondary | ICD-10-CM | POA: Diagnosis not present

## 2013-10-15 DIAGNOSIS — L820 Inflamed seborrheic keratosis: Secondary | ICD-10-CM | POA: Diagnosis not present

## 2013-11-04 ENCOUNTER — Other Ambulatory Visit: Payer: Self-pay | Admitting: Internal Medicine

## 2013-11-04 DIAGNOSIS — Z1239 Encounter for other screening for malignant neoplasm of breast: Secondary | ICD-10-CM

## 2013-11-14 ENCOUNTER — Encounter: Payer: Self-pay | Admitting: Internal Medicine

## 2013-11-14 ENCOUNTER — Ambulatory Visit (INDEPENDENT_AMBULATORY_CARE_PROVIDER_SITE_OTHER): Payer: Medicare Other | Admitting: Internal Medicine

## 2013-11-14 VITALS — BP 126/86 | HR 70 | Temp 97.6°F | Wt 131.4 lb

## 2013-11-14 DIAGNOSIS — J309 Allergic rhinitis, unspecified: Secondary | ICD-10-CM

## 2013-11-14 DIAGNOSIS — I10 Essential (primary) hypertension: Secondary | ICD-10-CM

## 2013-11-14 DIAGNOSIS — L259 Unspecified contact dermatitis, unspecified cause: Secondary | ICD-10-CM

## 2013-11-14 DIAGNOSIS — F411 Generalized anxiety disorder: Secondary | ICD-10-CM | POA: Diagnosis not present

## 2013-11-14 NOTE — Progress Notes (Signed)
Pre visit review using our clinic review tool, if applicable. No additional management support is needed unless otherwise documented below in the visit note. 

## 2013-11-14 NOTE — Progress Notes (Signed)
Subjective:    Patient ID: [name], female    DOB: [birth year], 76 y.o.   MRN: [id number]  HPI Mrs. [name] presents for follow up. She wants to review her meds and she is also having recurrent "flutters." She did have 48 hr holter- PVCs only. Explained the feeling associated with the compensatory pause.   She had full allergy testing with Dr. Maple Hudson - no identified allergen. She saw Dr. Rogelia Rohrer in April - diagnosed with contact dermatitis due to detergents. She changed to [facility] and has not had further problems.  Her husband is doing better: still with memory issues but he has stopped drinking.   Past Medical History  Diagnosis Date  . Hemorrhoids, external   . Personal history of diseases of skin and subcutaneous tissue   . Other and unspecified hyperlipidemia   . Personal history of unspecified disease   . Need for prophylactic hormone replacement therapy (postmenopausal)   . Allergic rhinitis, cause unspecified   . Irritable bowel syndrome   . Contact dermatitis 03/16/2013   Past Surgical History  Procedure Laterality Date  . Benign moles removed    . Sebaceous cyst excised    . Hemorrhoid surgery    . Cataract extraction w/ intraocular lens implant Bilateral     right - Nov '11, left - Nov '13 [facility]   Family History  Problem Relation Age of Onset  . Heart disease Father   . Cancer Sister     breast  . Heart disease Other   . Heart disease Other   . Heart disease Other   . Cancer Sister     breast  . Hypothyroidism Sister   . Asthma Sister   . Rheum arthritis Paternal Grandmother   . Rheum arthritis Daughter    History   Social History  . Marital Status: Married    Spouse Name: N/A    Number of Children: 4  . Years of Education: N/A   Occupational History  . retired    Social History Main Topics  . Smoking status: Former Smoker -- 0.10 packs/day for 1 years    Types: Cigarettes    Quit date: 12/12/1962  . Smokeless tobacco: Never Used  .  Alcohol Use: Yes     Comment: rarely  . Drug Use: No  . Sexual Activity: Yes    Partners: Male   Other Topics Concern  . Not on file   Social History Narrative   College grad. Married '61. 4 daughters, 2 grandchildren. Work - Furniture conservator/restorer mfg. - retired.    Current Outpatient Prescriptions on File Prior to Visit  Medication Sig Dispense Refill  . diphenhydrAMINE (BENADRYL) 25 MG tablet Take 25 mg by mouth every 6 (six) hours as needed for itching or allergies.      [name] EPINEPHrine (EPIPEN) 0.3 mg/0.3 mL DEVI Inject 0.3 mLs (0.3 mg total) into the muscle as needed.  2 Device  3  . estradiol (ESTRACE) 1 MG tablet Take 0.5 mg by mouth at bedtime.       . fluticasone (FLONASE) 50 MCG/ACT nasal spray Place 2 sprays into the nose daily.      [name] loratadine (CLARITIN) 10 MG tablet Take 10 mg by mouth daily as needed for allergies.      . progesterone (PROMETRIUM) 100 MG capsule Take 100 mg by mouth at bedtime.        No current facility-administered medications on file prior to visit.   Current Outpatient Prescriptions  on File Prior to Visit  Medication Sig Dispense Refill  . diphenhydrAMINE (BENADRYL) 25 MG tablet Take 25 mg by mouth every 6 (six) hours as needed for itching or allergies.      [name] EPINEPHrine (EPIPEN) 0.3 mg/0.3 mL DEVI Inject 0.3 mLs (0.3 mg total) into the muscle as needed.  2 Device  3  . estradiol (ESTRACE) 1 MG tablet Take 0.5 mg by mouth at bedtime.       . fluticasone (FLONASE) 50 MCG/ACT nasal spray Place 2 sprays into the nose daily.      [name] loratadine (CLARITIN) 10 MG tablet Take 10 mg by mouth daily as needed for allergies.      . progesterone (PROMETRIUM) 100 MG capsule Take 100 mg by mouth at bedtime.        No current facility-administered medications on file prior to visit.       Review of Systems Constitutional:  Negative for fever, chills, activity change and unexpected weight change.  HEENT:  Negative for hearing loss, ear pain, congestion, neck  stiffness and postnasal drip. Negative for sore throat or swallowing problems. Negative for dental complaints.   Eyes: Negative for vision loss or change in visual acuity.  Respiratory: Negative for chest tightness and wheezing. Negative for DOE.   Cardiovascular: Negative for chest pain, positive for palpitations. No decreased exercise tolerance Gastrointestinal: No change in bowel habit. No bloating or gas. No reflux or indigestion Genitourinary: Negative for urgency, frequency, flank pain and difficulty urinating.  Musculoskeletal: Negative for myalgias, back pain, arthralgias and gait problem.  Neurological: Negative for dizziness, tremors, weakness and headaches.  Hematological: Negative for adenopathy.  Psychiatric/Behavioral: Negative for behavioral problems and dysphoric mood.       Objective:   Physical Exam Filed Vitals:   11/14/13 0926  BP: 126/86  Pulse: 70  Temp: 97.6 F (36.4 C)   Wt Readings from Last 3 Encounters:  11/14/13 131 lb 6.4 oz (59.603 kg)  05/28/13 131 lb (59.421 kg)  03/22/13 127 lb 3.2 oz (57.698 kg)   gen'l - WNWD women Cor  RRR Pul - Normal Neuro - A&0 x3       Assessment & Plan:

## 2013-11-14 NOTE — Assessment & Plan Note (Signed)
Stable. No identified allergen.

## 2013-11-16 NOTE — Assessment & Plan Note (Signed)
She feels she is doing much better. Exercising has been very helpful.

## 2013-11-16 NOTE — Assessment & Plan Note (Signed)
Rash resolved with change in laundry detergent. She has had allergy evaluation - negative. Reviewed all her previously reported drug allergies removing most since in retrospect it may have been this contact allergy all along.

## 2013-11-16 NOTE — Assessment & Plan Note (Signed)
BP Readings from Last 3 Encounters:  11/14/13 126/86  05/28/13 110/76  03/22/13 114/70   Good BP. No indication for medical therapy

## 2013-11-21 ENCOUNTER — Encounter: Payer: Self-pay | Admitting: Internal Medicine

## 2013-11-21 NOTE — Telephone Encounter (Signed)
Updated mammogram in health maintenance

## 2014-01-23 ENCOUNTER — Ambulatory Visit (INDEPENDENT_AMBULATORY_CARE_PROVIDER_SITE_OTHER): Payer: Medicare Other | Admitting: Internal Medicine

## 2014-01-23 ENCOUNTER — Encounter: Payer: Self-pay | Admitting: Internal Medicine

## 2014-01-23 ENCOUNTER — Ambulatory Visit: Payer: Medicare Other | Admitting: Internal Medicine

## 2014-01-23 VITALS — BP 166/86 | HR 77 | Temp 98.1°F | Wt 134.4 lb

## 2014-01-23 DIAGNOSIS — I10 Essential (primary) hypertension: Secondary | ICD-10-CM | POA: Diagnosis not present

## 2014-01-23 NOTE — Patient Instructions (Signed)
Elevated Blood pressure - on exam there are no signs of "malignant" hypertension -= changes that would require immediate treatment or that is dangerous. Record reviewed and in the main BP is great. Today BP is 170/88 right, 160/84 left.  Plan  Do not take any decongestants  Monitor your blood pressure a couple of times a day Fri, Sat, Sun  For BP that is greater than 140 consistently call on Monday - will if BP is running high start a diuretic.

## 2014-01-23 NOTE — Progress Notes (Signed)
Pre visit review using our clinic review tool, if applicable. No additional management support is needed unless otherwise documented below in the visit note. 

## 2014-01-23 NOTE — Progress Notes (Signed)
Subjective:    Patient ID: [name], female    DOB: [birth year], 78 y.o.   MRN: [id number]  HPI This AM she has had a headache and a stiff neck, which has been a problem for several days. She was at the drugstore, feeling bad, and took her BP which was 198/92. On repeat it was similar. She went home and her 173/93. She is still having a minor stiff neck. She has not felt feverish, no photophobia, positive for intermittent headache and she did have trouble reading this AM. She also felt dizzy. Reviewed flow sheet - her BP has been well controlled over the past year.   Past Medical History  Diagnosis Date  . Hemorrhoids, external   . Personal history of diseases of skin and subcutaneous tissue   . Other and unspecified hyperlipidemia   . Personal history of unspecified disease   . Need for prophylactic hormone replacement therapy (postmenopausal)   . Allergic rhinitis, cause unspecified   . Irritable bowel syndrome   . Contact dermatitis 03/16/2013   Past Surgical History  Procedure Laterality Date  . Benign moles removed    . Sebaceous cyst excised    . Hemorrhoid surgery    . Cataract extraction w/ intraocular lens implant Bilateral     right - Nov '11, left - Nov '13 [place]   Family History  Problem Relation Age of Onset  . Heart disease Father   . Cancer Sister     breast  . Heart disease Other   . Heart disease Other   . Heart disease Other   . Cancer Sister     breast  . Hypothyroidism Sister   . Asthma Sister   . Rheum arthritis Paternal Grandmother   . Rheum arthritis Daughter    History   Social History  . Marital Status: Married    Spouse Name: N/A    Number of Children: 4  . Years of Education: N/A   Occupational History  . retired    Social History Main Topics  . Smoking status: Former Smoker -- 0.10 packs/day for 1 years    Types: Cigarettes    Quit date: 12/12/1962  . Smokeless tobacco: Never Used  . Alcohol Use: Yes     Comment:  rarely  . Drug Use: No  . Sexual Activity: Yes    Partners: Male   Other Topics Concern  . Not on file   Social History Narrative   College grad. Married '61. 4 daughters, 2 grandchildren. 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     Current Outpatient Prescriptions on File Prior to Visit  Medication Sig Dispense Refill  . diphenhydrAMINE (BENADRYL) 25 MG tablet Take 25 mg by mouth every 6 (six) hours as needed for itching or allergies.      [name] EPINEPHrine (EPIPEN) 0.3 mg/0.3 mL DEVI Inject 0.3 mLs (0.3 mg total) into the muscle as needed.  2 Device  3  . estradiol (ESTRACE) 1 MG tablet Take 0.5 mg by mouth at bedtime.       . fluticasone (FLONASE) 50 MCG/ACT nasal spray Place 2 sprays into the nose daily.      [name] loratadine (CLARITIN) 10 MG tablet Take 10 mg by mouth daily as needed for allergies.      . progesterone (PROMETRIUM) 100 MG capsule Take 100 mg by mouth at bedtime.        No current facility-administered medications on file prior to visit.  Review of Systems System review is negative for any constitutional, cardiac, pulmonary, GI or neuro symptoms or complaints other than as described in the HPI.     Objective:   Physical Exam Filed Vitals:   01/23/14 1327  BP: 166/86  Pulse: 77  Temp: 98.1 F (36.7 C)   BP Readings from Last 3 Encounters:  01/23/14 166/86  11/14/13 126/86  05/28/13 110/76   Gen'l- slender and anxious woman in no distress HEENT- fundoscopic exam - no copper wiring, no AV nicking, no exudates or hemorrhages Cor 2+ radial, RRR Pulm - CTAP Neuro - A&O x 3, CN II-XII grossly normal, normal mentation, normal gait.        Assessment & Plan:

## 2014-01-25 NOTE — Assessment & Plan Note (Signed)
Recent BP excursion w/o symptoms - patient reports use of phenylephrine (Mucinex Extra fast cold and cough).  Plan No need for medications  Avoid decongestant.

## 2014-01-27 ENCOUNTER — Encounter: Payer: Self-pay | Admitting: Internal Medicine

## 2014-03-02 DIAGNOSIS — R51 Headache: Secondary | ICD-10-CM | POA: Diagnosis not present

## 2014-03-02 DIAGNOSIS — I10 Essential (primary) hypertension: Secondary | ICD-10-CM | POA: Diagnosis not present

## 2014-03-03 ENCOUNTER — Ambulatory Visit (INDEPENDENT_AMBULATORY_CARE_PROVIDER_SITE_OTHER): Payer: Medicare Other | Admitting: Family Medicine

## 2014-03-03 ENCOUNTER — Encounter: Payer: Self-pay | Admitting: Family Medicine

## 2014-03-03 VITALS — BP 144/80 | Temp 97.2°F | Wt 136.0 lb

## 2014-03-03 DIAGNOSIS — I10 Essential (primary) hypertension: Secondary | ICD-10-CM | POA: Diagnosis not present

## 2014-03-03 MED ORDER — LISINOPRIL 5 MG PO TABS: 5.0000 mg | ORAL_TABLET | Freq: Every day | ORAL | Status: DC

## 2014-03-03 NOTE — Progress Notes (Signed)
Chief Complaint  Patient presents with  . Hypertension    HPI:  Acute visit for HTN: No appointments available at her PCP office. Has had issues with intermittently elevated BP over the last few months Reports last week was 160-170/over 80s one day and was 160-170s/80s yesterday at [facility] - started on HCTZ 12.5 mg Reports on lisinopril a year ago but stopped when having rashes but did not help rash and found out it was contact allergy to new laundry soap Sometimes can feel pressure in head when BP is up - admits to anxiety and worries a lot about her blood pressure. Denies: fevers, HA currently, palpitations  ROS: See pertinent positives and negatives per HPI.  Past Medical History  Diagnosis Date  . Hemorrhoids, external   . Personal history of diseases of skin and subcutaneous tissue   . Other and unspecified hyperlipidemia   . Personal history of unspecified disease   . Need for prophylactic hormone replacement therapy (postmenopausal)   . Allergic rhinitis, cause unspecified   . Irritable bowel syndrome   . Contact dermatitis 03/16/2013    Past Surgical History  Procedure Laterality Date  . Benign moles removed    . Sebaceous cyst excised    . Hemorrhoid surgery    . Cataract extraction w/ intraocular lens implant Bilateral     right - Nov '11, left - Nov '13 [place]    Family History  Problem Relation Age of Onset  . Heart disease Father   . Cancer Sister     breast  . Heart disease Other   . Heart disease Other   . Heart disease Other   . Cancer Sister     breast  . Hypothyroidism Sister   . Asthma Sister   . Rheum arthritis Paternal Grandmother   . Rheum arthritis Daughter     History   Social History  . Marital Status: Married    Spouse Name: N/A    Number of Children: 4  . Years of Education: N/A   Occupational History  . retired    Social History Main Topics  . Smoking status: Former Smoker -- 0.10 packs/day for 1 years    Types:  Cigarettes    Quit date: 12/12/1962  . Smokeless tobacco: Never Used  . Alcohol Use: Yes     Comment: rarely  . Drug Use: No  . Sexual Activity: Yes    Partners: Male   Other Topics Concern  . None   Social History Narrative   College grad. Married '61. 4 daughters, 2 grandchildren. 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    Current outpatient prescriptions:diphenhydrAMINE (BENADRYL) 25 MG tablet, Take 25 mg by mouth every 6 (six) hours as needed for itching or allergies., Disp: , Rfl: ;  EPINEPHrine (EPIPEN) 0.3 mg/0.3 mL DEVI, Inject 0.3 mLs (0.3 mg total) into the muscle as needed., Disp: 2 Device, Rfl: 3;  estradiol (ESTRACE) 1 MG tablet, Take 0.5 mg by mouth at bedtime. , Disp: , Rfl:  fluticasone (FLONASE) 50 MCG/ACT nasal spray, Place 2 sprays into the nose daily., Disp: , Rfl: ;  lisinopril (PRINIVIL,ZESTRIL) 5 MG tablet, Take 1 tablet (5 mg total) by mouth daily., Disp: 30 tablet, Rfl: 3;  loratadine (CLARITIN) 10 MG tablet, Take 10 mg by mouth daily as needed for allergies., Disp: , Rfl: ;  progesterone (PROMETRIUM) 100 MG capsule, Take 100 mg by mouth at bedtime. , Disp: , Rfl:   EXAM:  Filed Vitals:  03/03/14 1555  BP: 144/80  Temp: 97.2 F (36.2 C)    Body mass index is 24.28 kg/(m^2).  GENERAL: vitals reviewed and listed above, alert, oriented, appears well hydrated and in no acute distress  HEENT: atraumatic, conjunttiva clear, no obvious abnormalities on inspection of external nose and ears  NECK: no obvious masses on inspection  LUNGS: clear to auscultation bilaterally, no wheezes, rales or rhonchi, good air movement  CV: HRRR, no peripheral edema  MS: moves all extremities without noticeable abnormality  PSYCH: pleasant and cooperative, no obvious depression or anxiety  ASSESSMENT AND PLAN:  Discussed the following assessment and plan:  Hypertension - Plan: lisinopril (PRINIVIL,ZESTRIL) 5 MG tablet  -she is very concerned about HTN and wants to  try combo hctz and lisinopril after discussion -discussed risks and benefits and return precautions -follow up in one month -Patient advised to return or notify a doctor immediately if symptoms worsen or persist or new concerns arise.  Patient Instructions  -start the lisinopril daily and continue the hctz  -follow up in 1 month     Kathryn Ingman R.

## 2014-03-03 NOTE — Patient Instructions (Signed)
-  start the lisinopril daily and continue the hctz  -follow up in 1 month

## 2014-03-04 ENCOUNTER — Telehealth: Payer: Self-pay | Admitting: Family Medicine

## 2014-03-04 NOTE — Telephone Encounter (Signed)
Relevant patient education assigned to patient using Emmi. ° °

## 2014-03-06 ENCOUNTER — Telehealth: Payer: Self-pay | Admitting: Family Medicine

## 2014-03-06 NOTE — Telephone Encounter (Signed)
Patient Information:  Caller Name: Raynah  Phone: [phone]  Patient: [name]  Gender: Female  DOB: 1936/04/06  Age: 78 Years  PCP: Bluford Kaufmann (Family Practice > 39yrs old)  Office Follow Up:  Does the office need to follow up with this patient?: Yes  Instructions For The Office: Please f/u with pt after speaking to provider, thank you.   Symptoms  Reason For Call & Symptoms: Pt reports she has low blood pressure this AM  99/57.  Pt reports provider started her on Lisinopril 5 mg and HCTZ 12.5 mg.  Pt c/o dizziness and weakness.  Reviewed Health History In EMR: Yes  Reviewed Medications In EMR: Yes  Reviewed Allergies In EMR: Yes  Reviewed Surgeries / Procedures: Yes  Date of Onset of Symptoms: 03/05/2014  Guideline(s) Used:  Diarrhea  Dizziness  Disposition Per Guideline:   Discuss with PCP and Callback by Nurse Today  Reason For Disposition Reached:   Taking a medicine that could cause dizziness (e.g., blood pressure medications, diuretics)  Advice Given:  Call Back If:  You become worse.  Patient Will Follow Care Advice:  YES

## 2014-03-06 NOTE — Telephone Encounter (Signed)
Pls advise.  

## 2014-03-06 NOTE — Telephone Encounter (Signed)
Since Dr. Regis Bill is out of the office the remainder of the day I spoke with Dr. Sherren Mocha and pt should stop both BP meds and if her BP begins to rise again pt can start Lisinopril 5 mg and take 1/2 of the tablet until seen next week by [name]. Called and spoke with pt and pt is aware. Pt is aware of appt on 3.30.15 at 8 am.

## 2014-03-06 NOTE — Telephone Encounter (Signed)
Stop BOTH BP  medication if bp is low and see  Your PCPsoon check readings make appt next week

## 2014-03-10 ENCOUNTER — Encounter: Payer: Self-pay | Admitting: Family Medicine

## 2014-03-10 ENCOUNTER — Ambulatory Visit (INDEPENDENT_AMBULATORY_CARE_PROVIDER_SITE_OTHER): Payer: Medicare Other | Admitting: Family Medicine

## 2014-03-10 VITALS — BP 140/80 | HR 66 | Wt 133.0 lb

## 2014-03-10 DIAGNOSIS — R002 Palpitations: Secondary | ICD-10-CM | POA: Diagnosis not present

## 2014-03-10 DIAGNOSIS — F411 Generalized anxiety disorder: Secondary | ICD-10-CM | POA: Diagnosis not present

## 2014-03-10 DIAGNOSIS — I10 Essential (primary) hypertension: Secondary | ICD-10-CM | POA: Diagnosis not present

## 2014-03-10 NOTE — Progress Notes (Signed)
Pre visit review using our clinic review tool, if applicable. No additional management support is needed unless otherwise documented below in the visit note. 

## 2014-03-10 NOTE — Patient Instructions (Signed)
-  continue hydrochlorthiazide daily in the morning  -follow up as scheduled

## 2014-03-10 NOTE — Progress Notes (Signed)
Chief Complaint  Patient presents with  . Hypertension    HPI:  NOTE: I am listed as PCP but pt has not had appt to establish care, review hx with me.  Acute visit for HTN: -started on HCTZ by PCP but she was concerned with elevated BP so low dose lisinopril added last visit -then called last week to report low BP and dizziness and covering provider advised to dc both meds -she reports: report BP has been fine since with only a few >140s/80s and this is normal range for her -denies:CP, SOB, palpitations - except chroinc mild PVCs with anxiety evaluated in the past   ROS: See pertinent positives and negatives per HPI.  Past Medical History  Diagnosis Date  . Hemorrhoids, external   . Personal history of diseases of skin and subcutaneous tissue   . Other and unspecified hyperlipidemia   . Personal history of unspecified disease   . Need for prophylactic hormone replacement therapy (postmenopausal)   . Allergic rhinitis, cause unspecified   . Irritable bowel syndrome   . Contact dermatitis 03/16/2013    Past Surgical History  Procedure Laterality Date  . Benign moles removed    . Sebaceous cyst excised    . Hemorrhoid surgery    . Cataract extraction w/ intraocular lens implant Bilateral     right - Nov '11, left - Nov '13 [place]    Family History  Problem Relation Age of Onset  . Heart disease Father   . Cancer Sister     breast  . Heart disease Other   . Heart disease Other   . Heart disease Other   . Cancer Sister     breast  . Hypothyroidism Sister   . Asthma Sister   . Rheum arthritis Paternal Grandmother   . Rheum arthritis Daughter     History   Social History  . Marital Status: Married    Spouse Name: N/A    Number of Children: 4  . Years of Education: N/A   Occupational History  . retired    Social History Main Topics  . Smoking status: Former Smoker -- 0.10 packs/day for 1 years    Types: Cigarettes    Quit date: 12/12/1962  .  Smokeless tobacco: Never Used  . Alcohol Use: Yes     Comment: rarely  . Drug Use: No  . Sexual Activity: Yes    Partners: Male   Other Topics Concern  . None   Social History Narrative   College grad. Married '61. 4 daughters, 2 grandchildren. 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    Current outpatient prescriptions:diphenhydrAMINE (BENADRYL) 25 MG tablet, Take 25 mg by mouth every 6 (six) hours as needed for itching or allergies., Disp: , Rfl: ;  EPINEPHrine (EPIPEN) 0.3 mg/0.3 mL DEVI, Inject 0.3 mLs (0.3 mg total) into the muscle as needed., Disp: 2 Device, Rfl: 3;  estradiol (ESTRACE) 1 MG tablet, Take 0.5 mg by mouth at bedtime. , Disp: , Rfl:  fluticasone (FLONASE) 50 MCG/ACT nasal spray, Place 2 sprays into the nose daily., Disp: , Rfl: ;  hydrochlorothiazide (MICROZIDE) 12.5 MG capsule, Take 12.5 mg by mouth daily., Disp: , Rfl: ;  ibuprofen (ADVIL,MOTRIN) 100 MG tablet, Take 100 mg by mouth every 6 (six) hours as needed for fever., Disp: , Rfl: ;  loratadine (CLARITIN) 10 MG tablet, Take 10 mg by mouth daily as needed for allergies., Disp: , Rfl:  progesterone (PROMETRIUM) 100 MG capsule, Take 100  mg by mouth at bedtime. , Disp: , Rfl:   EXAM:  Filed Vitals:   03/10/14 0811  BP: 140/80  Pulse: 66    Body mass index is 23.74 kg/(m^2).  GENERAL: vitals reviewed and listed above, alert, oriented, appears well hydrated and in no acute distress  HEENT: atraumatic, conjunttiva clear, no obvious abnormalities on inspection of external nose and ears  NECK: no obvious masses on inspection  LUNGS: clear to auscultation bilaterally, no wheezes, rales or rhonchi, good air movement  CV: HRRR, no peripheral edema  MS: moves all extremities without noticeable abnormality  PSYCH: pleasant and cooperative, no obvious depression or anxiety  ASSESSMENT AND PLAN:  Discussed the following assessment and plan:  Hypertension  Palpitations  Generalized anxiety  disorder  -stop lisinopril -restart hctz -follow up as scheduled -Patient advised to return or notify a doctor immediately if symptoms worsen or persist or new concerns arise.  Patient Instructions  -continue hydrochlorthiazide daily in the morning  -follow up as scheduled     Terrye Dombrosky R.

## 2014-03-17 ENCOUNTER — Institutional Professional Consult (permissible substitution): Payer: Medicare Other | Admitting: Cardiovascular Disease

## 2014-03-22 ENCOUNTER — Emergency Department (HOSPITAL_COMMUNITY)
Admission: EM | Admit: 2014-03-22 | Discharge: 2014-03-22 | Disposition: A | Discharge status: Home / Self Care | Payer: Medicare Other | Attending: Emergency Medicine | Admitting: Emergency Medicine

## 2014-03-22 ENCOUNTER — Encounter (HOSPITAL_COMMUNITY): Payer: Self-pay | Admitting: Emergency Medicine

## 2014-03-22 DIAGNOSIS — Z87898 Personal history of other specified conditions: Secondary | ICD-10-CM | POA: Insufficient documentation

## 2014-03-22 DIAGNOSIS — Z8709 Personal history of other diseases of the respiratory system: Secondary | ICD-10-CM | POA: Insufficient documentation

## 2014-03-22 DIAGNOSIS — I10 Essential (primary) hypertension: Secondary | ICD-10-CM | POA: Insufficient documentation

## 2014-03-22 DIAGNOSIS — F411 Generalized anxiety disorder: Secondary | ICD-10-CM | POA: Diagnosis not present

## 2014-03-22 DIAGNOSIS — Z872 Personal history of diseases of the skin and subcutaneous tissue: Secondary | ICD-10-CM | POA: Diagnosis not present

## 2014-03-22 DIAGNOSIS — Z862 Personal history of diseases of the blood and blood-forming organs and certain disorders involving the immune mechanism: Secondary | ICD-10-CM | POA: Insufficient documentation

## 2014-03-22 DIAGNOSIS — Z8719 Personal history of other diseases of the digestive system: Secondary | ICD-10-CM | POA: Insufficient documentation

## 2014-03-22 DIAGNOSIS — Z87891 Personal history of nicotine dependence: Secondary | ICD-10-CM | POA: Diagnosis not present

## 2014-03-22 DIAGNOSIS — Z79899 Other long term (current) drug therapy: Secondary | ICD-10-CM | POA: Diagnosis not present

## 2014-03-22 DIAGNOSIS — R42 Dizziness and giddiness: Secondary | ICD-10-CM | POA: Insufficient documentation

## 2014-03-22 DIAGNOSIS — R51 Headache: Secondary | ICD-10-CM | POA: Insufficient documentation

## 2014-03-22 DIAGNOSIS — Z8639 Personal history of other endocrine, nutritional and metabolic disease: Secondary | ICD-10-CM | POA: Insufficient documentation

## 2014-03-22 HISTORY — DX: Essential (primary) hypertension: I10

## 2014-03-22 LAB — BASIC METABOLIC PANEL
BUN: 10 mg/dL (ref 6–23)
CALCIUM: 9.4 mg/dL (ref 8.4–10.5)
CO2: 23 mEq/L (ref 19–32)
Chloride: 95 mEq/L — ABNORMAL LOW (ref 96–112)
Creatinine, Ser: 0.68 mg/dL (ref 0.50–1.10)
GFR calc Af Amer: >90 mL/min (ref >90)
GFR, EST NON AFRICAN AMERICAN: 83 mL/min — AB (ref >90)
Glucose, Bld: 119 mg/dL — ABNORMAL HIGH (ref 70–99)
Potassium: 3.5 mEq/L — ABNORMAL LOW (ref 3.7–5.3)
SODIUM: 134 meq/L — AB (ref 137–147)

## 2014-03-22 LAB — CBC
HCT: 40.3 % (ref 36.0–46.0)
HEMOGLOBIN: 14.2 g/dL (ref 12.0–15.0)
MCH: 30.9 pg (ref 26.0–34.0)
MCHC: 35.2 g/dL (ref 30.0–36.0)
MCV: 87.6 fL (ref 78.0–100.0)
Platelets: 249 10*3/uL (ref 150–400)
RBC: 4.6 MIL/uL (ref 3.87–5.11)
RDW: 12.4 % (ref 11.5–15.5)
WBC: 8.1 10*3/uL (ref 4.0–10.5)

## 2014-03-22 LAB — I-STAT TROPONIN, ED: Troponin i, poc: 0 ng/mL (ref 0.00–0.08)

## 2014-03-22 NOTE — Discharge Instructions (Signed)
Continue taking hctz 12.5 mg daily. Take an extra dose if you have elevated blood pressure.   Follow up with your doctor regarding your blood pressure.   Return to ER if you have chest pain, shortness of breath, numbness, weakness.

## 2014-03-22 NOTE — ED Provider Notes (Signed)
CSN: [id number]     Arrival date & time 03/22/14  1610 History   First MD Initiated Contact with Patient 03/22/14 1639     Chief Complaint  Patient presents with  . Hypertension     (Consider location/radiation/quality/duration/timing/severity/associated sxs/prior Treatment) The history is provided by the patient.  [name] is a 78 y.o. female hx of anxiety, HTN here with hypertension, dizziness. She has been on hydrochlorothiazide the last 3 weeks. She noticed some dizziness this morning and check a blood pressure that was 177/70. Has a mild headache but denies any nausea or chest pain or shortness of breath or abdominal pain. Denies any weakness or numbness. She took extra dose of her HCTZ prior to arrival. Of note she has been having labile blood pressure. She saw her doctor 2 weeks ago for low blood pressure and lisinopril was discontinued. She had similar episode of hypertension a year ago and was thought to be secondary to anxiety.    Past Medical History  Diagnosis Date  . Hemorrhoids, external   . Personal history of diseases of skin and subcutaneous tissue   . Other and unspecified hyperlipidemia   . Personal history of unspecified disease   . Need for prophylactic hormone replacement therapy (postmenopausal)   . Allergic rhinitis, cause unspecified   . Irritable bowel syndrome   . Contact dermatitis 03/16/2013  . Hypertension    Past Surgical History  Procedure Laterality Date  . Benign moles removed    . Sebaceous cyst excised    . Hemorrhoid surgery    . Cataract extraction w/ intraocular lens implant Bilateral     right - Nov '11, left - Nov '13 [place]   Family History  Problem Relation Age of Onset  . Heart disease Father   . Cancer Sister     breast  . Heart disease Other   . Heart disease Other   . Heart disease Other   . Cancer Sister     breast  . Hypothyroidism Sister   . Asthma Sister   . Rheum arthritis Paternal Grandmother   . Rheum  arthritis Daughter    History  Substance Use Topics  . Smoking status: Former Smoker -- 0.10 packs/day for 1 years    Types: Cigarettes    Quit date: 12/12/1962  . Smokeless tobacco: Never Used  . Alcohol Use: Yes     Comment: rarely   OB History   Grav Para Term Preterm Abortions TAB SAB Ect Mult Living                 Review of Systems  Neurological: Positive for dizziness.  All other systems reviewed and are negative.     Allergies  Cephalexin  Home Medications   Current Outpatient Rx  Name  Route  Sig  Dispense  Refill  . acetaminophen (TYLENOL) 325 MG tablet   Oral   Take 325 mg by mouth every 6 (six) hours as needed for mild pain.         . diphenhydrAMINE (BENADRYL) 25 MG tablet   Oral   Take 25 mg by mouth every 6 (six) hours as needed for itching or allergies.         [name] EPINEPHrine (EPIPEN) 0.3 mg/0.3 mL SOAJ injection   Intramuscular   Inject 0.3 mg into the muscle once as needed (for allergic reaction).         [name] estradiol (ESTRACE) 1 MG tablet   Oral   Take  0.5 mg by mouth at bedtime.          . hydrochlorothiazide (MICROZIDE) 12.5 MG capsule   Oral   Take 12.5 mg by mouth daily.         [name] ibuprofen (ADVIL,MOTRIN) 200 MG tablet   Oral   Take 200 mg by mouth every 6 (six) hours as needed for moderate pain.         [name] loratadine (CLARITIN) 10 MG tablet   Oral   Take 10 mg by mouth daily as needed for allergies.         [name] OVER THE COUNTER MEDICATION   Oral   Take 1 packet by mouth daily. Dissolve one packet in a glass of water each morning. Emergen-C         . progesterone (PROMETRIUM) 100 MG capsule   Oral   Take 100 mg by mouth at bedtime.           BP 137/72  Pulse 63  Temp(Src) 97.6 F (36.4 C) (Oral)  Resp 13  Ht 5\' 3"  (1.6 m)  Wt 133 lb (60.328 kg)  BMI 23.57 kg/m2  SpO2 97% Physical Exam  Nursing note and vitals reviewed. Constitutional: She is oriented to person, place, and time. She appears  well-developed and well-nourished.  Slightly anxious   HENT:  Head: Normocephalic.  Mouth/Throat: Oropharynx is clear and moist.  Eyes: Conjunctivae are normal. Pupils are equal, round, and reactive to light.  Neck: Normal range of motion. Neck supple.  Cardiovascular: Normal rate, regular rhythm and normal heart sounds.   Pulmonary/Chest: Effort normal and breath sounds normal. No respiratory distress. She has no wheezes. She has no rales.  Abdominal: Soft. Bowel sounds are normal. She exhibits no distension. There is no tenderness. There is no rebound and no guarding.  No abdominal bruit   Musculoskeletal: Normal range of motion. She exhibits no edema and no tenderness.  Neurological: She is alert and oriented to person, place, and time. No cranial nerve deficit. Coordination normal.  CN 2-12 intact. Nl strength and sensation. No pronator drift.   Skin: Skin is warm and dry.  Psychiatric: She has a normal mood and affect. Her behavior is normal. Judgment and thought content normal.    ED Course  Procedures (including critical care time) Labs Review Labs Reviewed  BASIC METABOLIC PANEL - Abnormal; Notable for the following:    Sodium 134 (*)    Potassium 3.5 (*)    Chloride 95 (*)    Glucose, Bld 119 (*)    GFR calc non Af Amer 83 (*)    All other components within normal limits  CBC  I-STAT TROPOININ, ED   Imaging Review No results found.   EKG Interpretation   Date/Time:  Saturday March 22 2014 16:22:55 EDT Ventricular Rate:  74 PR Interval:  192 QRS Duration: 72 QT Interval:  394 QTC Calculation: 437 R Axis:   66 Text Interpretation:  Normal sinus rhythm Nonspecific ST and T wave  abnormality Abnormal ECG QT no longer prolonged  Confirmed by Josep Luviano  MD,  Ashely Goosby ([id number]) on 03/22/2014 4:39:39 PM      MDM   Final diagnoses:  None   [name] is a 78 y.o. female here with dizziness, hypertension. BP dec to 140s with no treatment. I think she has labile BP. I  doubt hypertensive emergency or urgency. Labs stable. I recommend that she continues hctz and take an extra dose when she is hypertensive at home.  She should f/u with PMD for medication adjustment.     Wandra Arthurs, MD 03/22/14 [id number]

## 2014-03-22 NOTE — ED Notes (Addendum)
Pt reports not feeling well this am was having nausea and dizziness.reports htn and bp as high as 177/83 at home. Reports being started on hydralazine 3 weeks ago. bp is 153/96 at triage. No acute distress noted.

## 2014-03-22 NOTE — ED Notes (Signed)
Pt states she is under a lot of stress at home. She cares for her husband who has dementia and she feels anxious a lot of the time.

## 2014-03-23 ENCOUNTER — Other Ambulatory Visit: Payer: Self-pay | Admitting: Family Medicine

## 2014-03-24 ENCOUNTER — Telehealth: Payer: Self-pay | Admitting: Family Medicine

## 2014-03-24 NOTE — Telephone Encounter (Signed)
Called and spoke with pt and pt is aware of new appt on 03/25/2014.

## 2014-03-24 NOTE — Telephone Encounter (Signed)
[facility] Triage Call Report Triage Record Num: [id number] Operator: Gwinda Maine Patient Name: [name] Call Date & Time: 03/22/2014 3:08:09PM Patient Phone: [phone] PCP: Colin Benton Patient Gender: Female PCP Fax : Patient DOB: Aug 10, 1936 Practice Name: [facility] Reason for Call: Caller: [name]/Patient; PCP: Maudie Mercury (TEXT 1st, after 20 mins can call), [place] [facility]); CB#: [phone]; Call regarding Is feeling very dizzy and sick feeling after starting her medicine; Took medication at 03/22/14 at 04:30 HCTZ. Called EMT 03/22/14 11:00 a.m.due to dizzy,nausea, BP 177/83. 2nd dose of HCTZ at 12:30 p.m. Started HCTZ on 03/10/14. Current (03/22/14 at 3:17 p.m.) 171/84. Last BM 03/22/14. Triaged per Dizziness or Vertigo guideline. Disposition: See Provider within 24 hours for "Unexplained dizziness associated with nausea." [facility]. Care advice given. [name] verbalized understanding. Protocol(s) Used: Dizziness or Vertigo Recommended Outcome per Protocol: See Provider within 24 hours Reason for Outcome: Unexplained dizziness associated with nausea Care Advice: ~ DO NOT drive until condition evaluated. Avoid caffeine (coffee, tea, cola drinks, or chocolate), alcohol, and nicotine (use of tobacco), as use of these substances may worsen symptoms. ~ ~ Call provider immediately if have difficulty walking, vision problems, or weakness. ~ List, or take, all current prescription(s), nonprescription or alternative medication(s) to provider for evaluation.

## 2014-03-24 NOTE — Telephone Encounter (Signed)
Can we get her an appointment sooner to discuss/Check her anxiety and blood pressure?

## 2014-03-24 NOTE — Telephone Encounter (Signed)
Pt would like you to know that her bp went up sat  To 177/83. Pt called EMT. Pt called out after hours nurse and they advised pt to go to ed.  Pt states she had a xanax left over from previously and took one/half of a  ALPRAZolam (XANAX) 0.25 MG tablet. Pt states this helped. Pt would like a refill of this med to help with this. Pt has appt next week. Pt staes she is feeling better. [facility]: [facility]/  [address]

## 2014-03-25 ENCOUNTER — Ambulatory Visit (INDEPENDENT_AMBULATORY_CARE_PROVIDER_SITE_OTHER): Payer: Medicare Other | Admitting: Family Medicine

## 2014-03-25 ENCOUNTER — Telehealth: Payer: Self-pay | Admitting: Family Medicine

## 2014-03-25 ENCOUNTER — Encounter: Payer: Self-pay | Admitting: Family Medicine

## 2014-03-25 VITALS — BP 110/80 | Temp 98.6°F | Wt 131.0 lb

## 2014-03-25 DIAGNOSIS — F410 Panic disorder [episodic paroxysmal anxiety] without agoraphobia: Secondary | ICD-10-CM | POA: Diagnosis not present

## 2014-03-25 DIAGNOSIS — F411 Generalized anxiety disorder: Secondary | ICD-10-CM

## 2014-03-25 DIAGNOSIS — F419 Anxiety disorder, unspecified: Secondary | ICD-10-CM

## 2014-03-25 MED ORDER — LORAZEPAM 0.5 MG PO TABS: 0.5000 mg | ORAL_TABLET | Freq: Three times a day (TID) | ORAL | Status: DC

## 2014-03-25 NOTE — Progress Notes (Signed)
Chief Complaint  Patient presents with  . Hypertension    HPI:  Note: I am Listed as PCP - but she is a Dr. Linda Hedges patient, transfering to me next week. Seen today for two acute on chronic issues:  1)Anxiety: -reports given xanax in ED 1 year ago when had panic attack when BP was up -this helped -reports Dr. Linda Hedges tried sertraline in the past -  -reports BP was up last weekend and she had panic attack and she tried xanax from family member and this help -several panic attacks per year at the most - using benzo for this  2)HTN -dx 1 year ago - on BP medications now -denies: CP, SOB, palpitations, swelling  ROS: See pertinent positives and negatives per HPI.  Past Medical History  Diagnosis Date  . Hemorrhoids, external   . Personal history of diseases of skin and subcutaneous tissue   . Other and unspecified hyperlipidemia   . Personal history of unspecified disease   . Need for prophylactic hormone replacement therapy (postmenopausal)   . Allergic rhinitis, cause unspecified   . Irritable bowel syndrome   . Contact dermatitis 03/16/2013  . Hypertension     Past Surgical History  Procedure Laterality Date  . Benign moles removed    . Sebaceous cyst excised    . Hemorrhoid surgery    . Cataract extraction w/ intraocular lens implant Bilateral     right - Nov '11, left - Nov '13 [place]    Family History  Problem Relation Age of Onset  . Heart disease Father   . Cancer Sister     breast  . Heart disease Other   . Heart disease Other   . Heart disease Other   . Cancer Sister     breast  . Hypothyroidism Sister   . Asthma Sister   . Rheum arthritis Paternal Grandmother   . Rheum arthritis Daughter     History   Social History  . Marital Status: Married    Spouse Name: N/A    Number of Children: 4  . Years of Education: N/A   Occupational History  . retired    Social History Main Topics  . Smoking status: Former Smoker -- 0.10 packs/day for 1  years    Types: Cigarettes    Quit date: 12/12/1962  . Smokeless tobacco: Never Used  . Alcohol Use: Yes     Comment: rarely  . Drug Use: No  . Sexual Activity: Yes    Partners: Male   Other Topics Concern  . None   Social History Narrative   College grad. Married '61. 4 daughters, 2 grandchildren. 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    Current outpatient prescriptions:acetaminophen (TYLENOL) 325 MG tablet, Take 325 mg by mouth every 6 (six) hours as needed for mild pain., Disp: , Rfl: ;  diphenhydrAMINE (BENADRYL) 25 MG tablet, Take 25 mg by mouth every 6 (six) hours as needed for itching or allergies., Disp: , Rfl: ;  EPINEPHrine (EPIPEN) 0.3 mg/0.3 mL SOAJ injection, Inject 0.3 mg into the muscle once as needed (for allergic reaction)., Disp: , Rfl:  estradiol (ESTRACE) 1 MG tablet, Take 0.5 mg by mouth at bedtime. , Disp: , Rfl: ;  hydrochlorothiazide (MICROZIDE) 12.5 MG capsule, Take 12.5 mg by mouth daily., Disp: , Rfl: ;  ibuprofen (ADVIL,MOTRIN) 200 MG tablet, Take 200 mg by mouth every 6 (six) hours as needed for moderate pain., Disp: , Rfl: ;  loratadine (CLARITIN) 10  MG tablet, Take 10 mg by mouth daily as needed for allergies., Disp: , Rfl:  OVER THE COUNTER MEDICATION, Take 1 packet by mouth daily. Dissolve one packet in a glass of water each morning. Emergen-C, Disp: , Rfl: ;  progesterone (PROMETRIUM) 100 MG capsule, Take 100 mg by mouth at bedtime. , Disp: , Rfl: ;  LORazepam (ATIVAN) 0.5 MG tablet, Take 1 tablet (0.5 mg total) by mouth every 8 (eight) hours., Disp: 10 tablet, Rfl: 0  EXAM:  Filed Vitals:   03/25/14 0842  BP: 110/80  Temp: 98.6 F (37 C)    Body mass index is 23.21 kg/(m^2).  GENERAL: vitals reviewed and listed above, alert, oriented, appears well hydrated and in no acute distress  HEENT: atraumatic, conjunttiva clear, no obvious abnormalities on inspection of external nose and ears  NECK: no obvious masses on inspection  LUNGS: clear to  auscultation bilaterally, no wheezes, rales or rhonchi, good air movement  CV: HRRR, no peripheral edema  MS: moves all extremities without noticeable abnormality  PSYCH: pleasant and cooperative, no obvious depression or anxiety  ASSESSMENT AND PLAN:  Discussed the following assessment and plan:  Anxiety - Plan: LORazepam (ATIVAN) 0.5 MG tablet  Panic disorder - Plan: LORazepam (ATIVAN) 0.5 MG tablet  -discussed options for anxiety, given rare panic attacks opted for ativan prn and psych care if escalating concerns -BP great today -follow up for establish care visit -Patient advised to return or notify a doctor immediately if symptoms worsen or persist or new concerns arise.  Patient Instructions  -schedule new patient/transfer patient visit     [name]

## 2014-03-25 NOTE — Telephone Encounter (Signed)
That appointment is okay per [name].

## 2014-03-25 NOTE — Telephone Encounter (Signed)
Patient came to check out to schedule her next appointment and stated that [name] wanted her to have a new patient appointment as soon as it could be scheduled using a 30-minute slot. I noticed on her check out paperwork that she was to have a new patient/transfer patient visit. Patient also stated that she had an appointment scheduled for next Thursday, the 23rd but someone cancelled it. I also noticed that the patient had Medicare and she also stated that she has [facility] and that [name] wanted her to get this as soon as it could be scheduled. I scheduled it for 04/03/14 at 2:30 p.m. and then I spoke with Ms. Constance Holster and she also talked with the patient about her insurance and she still insisted that this is what [name] wanted her to do. Patient started to get upset and she mentioned that this shouldn't be a difficult issue because she knows what [name] told her and it's stuff like this that is causing her BP to go up. I verified the contact number, gave patient her appointment card and she will be here next Thursday. Thanks!

## 2014-03-25 NOTE — Patient Instructions (Signed)
-  schedule new patient/transfer patient visit

## 2014-03-25 NOTE — Progress Notes (Signed)
Pre visit review using our clinic review tool, if applicable. No additional management support is needed unless otherwise documented below in the visit note. 

## 2014-04-03 ENCOUNTER — Ambulatory Visit: Payer: Medicare Other | Admitting: Family Medicine

## 2014-04-03 ENCOUNTER — Encounter: Payer: Self-pay | Admitting: Family Medicine

## 2014-04-03 ENCOUNTER — Ambulatory Visit (INDEPENDENT_AMBULATORY_CARE_PROVIDER_SITE_OTHER): Payer: Medicare Other | Admitting: Family Medicine

## 2014-04-03 VITALS — BP 120/60 | HR 63 | Temp 97.7°F | Ht 63.0 in | Wt 130.2 lb

## 2014-04-03 DIAGNOSIS — I10 Essential (primary) hypertension: Secondary | ICD-10-CM

## 2014-04-03 DIAGNOSIS — E785 Hyperlipidemia, unspecified: Secondary | ICD-10-CM

## 2014-04-03 DIAGNOSIS — J309 Allergic rhinitis, unspecified: Secondary | ICD-10-CM

## 2014-04-03 DIAGNOSIS — F411 Generalized anxiety disorder: Secondary | ICD-10-CM | POA: Diagnosis not present

## 2014-04-03 DIAGNOSIS — R232 Flushing: Secondary | ICD-10-CM | POA: Insufficient documentation

## 2014-04-03 DIAGNOSIS — N951 Menopausal and female climacteric states: Secondary | ICD-10-CM | POA: Diagnosis not present

## 2014-04-03 MED ORDER — HYDROCHLOROTHIAZIDE 12.5 MG PO TABS: 12.5000 mg | ORAL_TABLET | Freq: Every day | ORAL | Status: DC

## 2014-04-03 NOTE — Progress Notes (Signed)
Pre visit review using our clinic review tool, if applicable. No additional management support is needed unless otherwise documented below in the visit note. 

## 2014-04-03 NOTE — Patient Instructions (Signed)
-  We have ordered labs or studies at this visit. It can take up to 1-2 weeks for results and processing. We will contact you with instructions IF your results are abnormal. Normal results will be released to your [facility] If you have not heard from [place] or can not find your results in [facility] in 2 weeks please contact our office.  -PLEASE SIGN UP FOR MYCHART TODAY   We recommend the following healthy lifestyle measures: - eat a healthy diet consisting of lots of vegetables, fruits, beans, nuts, seeds, healthy meats such as white chicken and fish and whole grains.  - avoid fried foods, fast food, processed foods, sodas, red meet and other fattening foods.  - get a least 150 minutes of aerobic exercise per week.   Follow up in: next few months for morning appointment for your annual wellness exam

## 2014-04-03 NOTE — Progress Notes (Signed)
No chief complaint on file.   HPI:  [name] is here to establish care. Used to see Dr. Linda Hedges. Recently saw me for acute visit for anxiety with rare panic attack and for HTN. Reports she is doing well.  Last PCP and physical: gets gyn physical with gyn  Has the following chronic problems and concerns today:  Patient Active Problem List   Diagnosis Date Noted  . Hot flashes - Managed by Dr. Philis Pique - gets yearly female gyn exams 04/03/2014  . Generalized anxiety disorder 02/19/2013  . Hypertension 05/24/2011  . HYPERLIPIDEMIA 02/07/2008  . ALLERGIC RHINITIS 02/07/2008    Health Maintenance: -annual exam 1 year ago  ROS: See pertinent positives and negatives per HPI.  Past Medical History  Diagnosis Date  . Hemorrhoids, external   . Personal history of diseases of skin and subcutaneous tissue   . Other and unspecified hyperlipidemia   . Personal history of unspecified disease   . Need for prophylactic hormone replacement therapy (postmenopausal)   . Allergic rhinitis, cause unspecified   . Irritable bowel syndrome   . Contact dermatitis 03/16/2013  . Hypertension   . HEMORRHOIDS, EXTERNAL 02/07/2008    Qualifier: History of  By: [place], [place]    . Palpitations 02/19/2013    On-set of symptoms March '14. EKG with NSR     Family History  Problem Relation Age of Onset  . Heart disease Father   . Cancer Sister     breast  . Heart disease Other   . Heart disease Other   . Heart disease Other   . Cancer Sister     breast  . Hypothyroidism Sister   . Asthma Sister   . Rheum arthritis Paternal Grandmother   . Rheum arthritis Daughter     History   Social History  . Marital Status: Married    Spouse Name: N/A    Number of Children: 4  . Years of Education: N/A   Occupational History  . retired    Social History Main Topics  . Smoking status: Former Smoker -- 0.10 packs/day for 1 years    Types: Cigarettes    Quit date: 12/12/1962  . Smokeless  tobacco: Never Used     Comment: only a few years  . Alcohol Use: Yes     Comment: rarely  . Drug Use: No  . Sexual Activity: Yes    Partners: Male   Other Topics Concern  . None   Social History Narrative   College grad. Married '61. 4 daughters, 2 grandchildren. 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      Advanced Directives: Has Living Will, HCPOA: Shan Levans Dominey [phone]    Current outpatient prescriptions:estradiol (ESTRACE) 1 MG tablet, Take 0.5 mg by mouth at bedtime. , Disp: , Rfl: ;  hydrochlorothiazide (HYDRODIURIL) 12.5 MG tablet, Take 1 tablet (12.5 mg total) by mouth daily., Disp: 90 tablet, Rfl: 3;  loratadine (CLARITIN) 10 MG tablet, Take 10 mg by mouth daily as needed for allergies., Disp: , Rfl:  OVER THE COUNTER MEDICATION, Take 1 packet by mouth daily. Dissolve one packet in a glass of water each morning. Emergen-C, Disp: , Rfl: ;  progesterone (PROMETRIUM) 100 MG capsule, Take 100 mg by mouth at bedtime. , Disp: , Rfl: ;  EPINEPHrine (EPIPEN) 0.3 mg/0.3 mL SOAJ injection, Inject 0.3 mg into the muscle once as needed (for allergic reaction)., Disp: , Rfl:  LORazepam (ATIVAN) 0.5 MG tablet, Take 1 tablet (0.5  mg total) by mouth every 8 (eight) hours., Disp: 10 tablet, Rfl: 0  EXAM:  Filed Vitals:   04/03/14 1430  BP: 120/60  Pulse: 63  Temp: 97.7 F (36.5 C)    Body mass index is 23.07 kg/(m^2).  GENERAL: vitals reviewed and listed above, alert, oriented, appears well hydrated and in no acute distress  HEENT: atraumatic, conjunttiva clear, no obvious abnormalities on inspection of external nose and ears  NECK: no obvious masses on inspection  LUNGS: clear to auscultation bilaterally, no wheezes, rales or rhonchi, good air movement  CV: HRRR, no peripheral edema  MS: moves all extremities without noticeable abnormality  PSYCH: pleasant and cooperative, no obvious depression or anxiety  ASSESSMENT AND PLAN:  Discussed the following assessment  and plan:  Hypertension  Hot flashes - Managed by Dr. Philis Pique - gets yearly female gyn exams  HYPERLIPIDEMIA  Generalized anxiety disorder  ALLERGIC RHINITIS  -We reviewed the PMH, PSH, FH, SH, Meds and Allergies. -We provided refills for any medications we will prescribe as needed. -We addressed current concerns per orders and patient instructions. -We have asked for records for pertinent exams, studies, vaccines and notes from previous providers. -We have advised patient to follow up per instructions below. -blood pressure is good and anxiety is doing much better -she will follow up for her medicare annual wellness exam  -Patient advised to return or notify a doctor immediately if symptoms worsen or persist or new concerns arise.  Patient Instructions  -We have ordered labs or studies at this visit. It can take up to 1-2 weeks for results and processing. We will contact you with instructions IF your results are abnormal. Normal results will be released to your [facility]. If you have not heard from [place] or can not find your results in [facility] in 2 weeks please contact our office.  -PLEASE SIGN UP FOR MYCHART TODAY   We recommend the following healthy lifestyle measures: - eat a healthy diet consisting of lots of vegetables, fruits, beans, nuts, seeds, healthy meats such as white chicken and fish and whole grains.  - avoid fried foods, fast food, processed foods, sodas, red meet and other fattening foods.  - get a least 150 minutes of aerobic exercise per week.   Follow up in: next few months for morning appointment for your annual wellness exam      [name]

## 2014-04-07 MED ORDER — AMLODIPINE BESYLATE 5 MG PO TABS: 5.0000 mg | ORAL_TABLET | Freq: Every day | ORAL | Status: DC

## 2014-04-07 NOTE — Addendum Note (Signed)
Addended by: Monico Blitz T on: 04/07/2014 01:46 PM   Modules accepted: Orders, Medications

## 2014-04-09 ENCOUNTER — Encounter: Payer: Self-pay | Admitting: Family Medicine

## 2014-05-07 ENCOUNTER — Ambulatory Visit (INDEPENDENT_AMBULATORY_CARE_PROVIDER_SITE_OTHER): Payer: Medicare Other | Admitting: Family Medicine

## 2014-05-07 ENCOUNTER — Encounter: Payer: Self-pay | Admitting: Family Medicine

## 2014-05-07 VITALS — BP 104/72 | HR 62 | Temp 97.6°F | Ht 63.0 in | Wt 130.5 lb

## 2014-05-07 DIAGNOSIS — E785 Hyperlipidemia, unspecified: Secondary | ICD-10-CM | POA: Diagnosis not present

## 2014-05-07 DIAGNOSIS — I10 Essential (primary) hypertension: Secondary | ICD-10-CM | POA: Diagnosis not present

## 2014-05-07 DIAGNOSIS — Z0000 Encounter for general adult medical examination without abnormal findings: Secondary | ICD-10-CM

## 2014-05-07 LAB — BASIC METABOLIC PANEL
BUN: 13 mg/dL (ref 6–23)
CALCIUM: 9.3 mg/dL (ref 8.4–10.5)
CO2: 26 mEq/L (ref 19–32)
CREATININE: 0.8 mg/dL (ref 0.4–1.2)
Chloride: 103 mEq/L (ref 96–112)
GFR: 70.98 mL/min (ref >60.00)
GLUCOSE: 82 mg/dL (ref 70–99)
Potassium: 3.9 mEq/L (ref 3.5–5.1)
Sodium: 139 mEq/L (ref 135–145)

## 2014-05-07 LAB — LIPID PANEL
CHOL/HDL RATIO: 4
Cholesterol: 273 mg/dL — ABNORMAL HIGH (ref 0–200)
HDL: 67 mg/dL (ref >39.00)
LDL Cholesterol: 185 mg/dL — ABNORMAL HIGH (ref 0–99)
Triglycerides: 107 mg/dL (ref 0.0–149.0)
VLDL: 21.4 mg/dL (ref 0.0–40.0)

## 2014-05-07 MED ORDER — HYDROCHLOROTHIAZIDE 12.5 MG PO CAPS: 12.5000 mg | ORAL_CAPSULE | Freq: Every day | ORAL | Status: DC

## 2014-05-07 NOTE — Progress Notes (Signed)
Medicare Annual Preventive Care Visit  (initial annual wellness or annual wellness exam)  1)HTN: -sticking hctz and feels good  1.) Patient-completed health risk assessment  - completed and reviewed, see scanned documentation  2.) Review of Medical History: -PMH, PSH, Family History and current specialty and care providers reviewed and updated and listed below  - see chart and below  3.) Review of functional ability and level of safety:  Any difficulty hearing?  NO  History of falling? NO  Any trouble with IADLs - using a phone, using transportation, grocery shopping, preparing meals, doing housework, doing laundry, taking medications and managing money?  NO  Advance Directives? YES   See summary of recommendations in Patient Instructions below.  4.) Physical Exam Filed Vitals:   05/07/14 0820  BP: 104/72  Pulse: 62  Temp: 97.6 F (36.4 C)   Estimated body mass index is 23.12 kg/(m^2) as calculated from the following:   Height as of this encounter: 5\' 3"  (1.6 m).   Weight as of this encounter: 130 lb 8 oz (59.194 kg).  Visual acuity grossly intact  Mini Cog: 1. Patient instructed to listen carefully and repeat the following: [place]  2. Clock drawing test was administered: NORMAL       3. Recall of three words 3/3  Scoring:  Patient Score: NEG   See patient instructions for recommendations.  4)The following written screening schedule of preventive measures were reviewed with assessment and plan made per below, orders and patient instructions:      AAA screening: N/A     Alcohol screening: done     Obesity Screening and counseling: done     STI screening: not interested     Tobacco Screening: done       Pneumococcal (PPSV23 -one dose after 64, one before if risk factors), influenza yearly and hepatitis B vaccines (if high risk - end stage renal disease, IV drugs, homosexual men, live in home for mentally retarded, hemophilia receiving  factors) ASSESSMENT/PLAN: yes      Screening mammograph (yearly if >40) ASSESSMENT/PLAN: had this in sept or October of 2014 - gets with her gynecologist      Colorectal cancer screening (FOBT yearly or flex sig q4y or colonoscopy q10y or barium enema q4y) ASSESSMENT/PLAN:  Had colonoscopy 3 years ago - told does not need again, does FOBT      Diabetes outpatient self-management training services ASSESSMENT/PLAN: n/a      Bone mass measurements(covered q2y if indicated - estrogen def, osteoporosis, hyperparathyroid, vertebral abnormalities, osteoporosis or steroids) ASSESSMENT/PLAN: done with gyn      Screening for glaucoma(q1y if high risk - diabetes, FH, AA and > 50 or hispanic and > 65) ASSESSMENT/PLAN: not at high risk, sees optho      Medical nutritional therapy for individuals with diabetes or renal disease ASSESSMENT/PLAN:n/a      Cardiovascular screening blood tests (lipids q5y) ASSESSMENT/PLAN: doing today      Diabetes screening tests ASSESSMENT/PLAN: doing today   7.) Summary: -risk factors and conditions per above assessment were discussed and treatment, recommendations and referrals were offered per documentation above and orders and patient instructions.  Medicare annual wellness visit, subsequent  Hypertension - Plan: Basic metabolic panel, hydrochlorothiazide (MICROZIDE) 12.5 MG capsule  HYPERLIPIDEMIA - Plan: Lipid Panel

## 2014-05-07 NOTE — Patient Instructions (Signed)
-  please provide our office manager with your advance directives  -get prevnar 13 around or after 08/2014  The following written screening schedule of preventive measures were reviewed:      Alcohol screening: done     Obesity Screening and counseling: done     STI screening: not interested     Tobacco Screening: done       Pneumococcal (PPSV23 -one dose after 64, one before if risk factors), influenza yearly and hepatitis B vaccines (if high risk - end stage renal disease, IV drugs, homosexual men, live in home for mentally retarded, hemophilia receiving factors) ASSESSMENT/PLAN: yes      Screening mammograph (yearly if >40) ASSESSMENT/PLAN: had this in sept or October of 2014 - gets with her gynecologist      Colorectal cancer screening (FOBT yearly or flex sig q4y or colonoscopy q10y or barium enema q4y) ASSESSMENT/PLAN:  Had colonoscopy 3 years ago - told does not need again, does FOBT      Bone mass measurements(covered q2y if indicated - estrogen def, osteoporosis, hyperparathyroid, vertebral abnormalities, osteoporosis or steroids) ASSESSMENT/PLAN: done with gyn      Cardiovascular screening blood tests (lipids q5y) ASSESSMENT/PLAN: doing today      Diabetes screening tests ASSESSMENT/PLAN: doing today

## 2014-05-07 NOTE — Progress Notes (Signed)
Pre visit review using our clinic review tool, if applicable. No additional management support is needed unless otherwise documented below in the visit note. 

## 2014-06-02 ENCOUNTER — Telehealth: Payer: Self-pay | Admitting: Family Medicine

## 2014-06-02 DIAGNOSIS — F419 Anxiety disorder, unspecified: Secondary | ICD-10-CM

## 2014-06-02 DIAGNOSIS — F410 Panic disorder [episodic paroxysmal anxiety] without agoraphobia: Secondary | ICD-10-CM

## 2014-06-02 MED ORDER — LORAZEPAM 0.5 MG PO TABS: 0.5000 mg | ORAL_TABLET | Freq: Three times a day (TID) | ORAL | Status: DC

## 2014-06-02 NOTE — Telephone Encounter (Signed)
I faxed the written Rx to [facility] at [phone] and the pt is aware and was scheduled for an appt on 06/09/14 at 1:00pm.

## 2014-06-02 NOTE — Telephone Encounter (Signed)
Refilled but needs appointment for any further refills.

## 2014-06-02 NOTE — Telephone Encounter (Signed)
Pt is needing new rx LORazepam (ATIVAN) 0.5 MG tablet, send to [facility].

## 2014-06-09 ENCOUNTER — Ambulatory Visit (INDEPENDENT_AMBULATORY_CARE_PROVIDER_SITE_OTHER): Payer: Medicare Other | Admitting: Family Medicine

## 2014-06-09 ENCOUNTER — Encounter: Payer: Self-pay | Admitting: Family Medicine

## 2014-06-09 VITALS — BP 108/70 | HR 67 | Temp 97.7°F | Ht 63.0 in | Wt 125.5 lb

## 2014-06-09 DIAGNOSIS — F411 Generalized anxiety disorder: Secondary | ICD-10-CM

## 2014-06-09 MED ORDER — PAROXETINE HCL 10 MG PO TABS: 20.0000 mg | ORAL_TABLET | Freq: Every day | ORAL | Status: DC

## 2014-06-09 NOTE — Patient Instructions (Signed)
-  start 10mg  daily of the paxil, can increase to 20mg  daily in 2 weeks if you wish  -continue to get as much exercise as you can  -consider counseling  -follow up in 4-6 week

## 2014-06-09 NOTE — Progress Notes (Signed)
Pre visit review using our clinic review tool, if applicable. No additional management support is needed unless otherwise documented below in the visit note. 

## 2014-06-09 NOTE — Progress Notes (Signed)
No chief complaint on file.   HPI:  Follow up:  GAD: -symptoms, generalized anxiety, occasional panic attack -husband's health has been poor, in and out of ED, in [place] [facility] - UTI -Hx of anxiety, reported prior PCP had tried sertraline in the past -has used benzos prn for panic - 1/4 of tablet every other day with generalized anxiety daily and stress -does not want to take benzos -denies: thoughts of self harm   ROS: See pertinent positives and negatives per HPI.  Past Medical History  Diagnosis Date  . Hemorrhoids, external   . Personal history of diseases of skin and subcutaneous tissue   . Other and unspecified hyperlipidemia   . Personal history of unspecified disease   . Need for prophylactic hormone replacement therapy (postmenopausal)   . Allergic rhinitis, cause unspecified   . Irritable bowel syndrome   . Contact dermatitis 03/16/2013  . Hypertension   . HEMORRHOIDS, EXTERNAL 02/07/2008    Qualifier: History of  By: [place], [place]    . Palpitations 02/19/2013    On-set of symptoms March '14. EKG with NSR     Past Surgical History  Procedure Laterality Date  . Benign moles removed    . Sebaceous cyst excised    . Hemorrhoid surgery    . Cataract extraction w/ intraocular lens implant Bilateral     right - Nov '11, left - Nov '13 [place]    Family History  Problem Relation Age of Onset  . Heart disease Father   . Cancer Sister     breast  . Heart disease Other   . Heart disease Other   . Heart disease Other   . Cancer Sister     breast  . Hypothyroidism Sister   . Asthma Sister   . Rheum arthritis Paternal Grandmother   . Rheum arthritis Daughter     History   Social History  . Marital Status: Married    Spouse Name: N/A    Number of Children: 4  . Years of Education: N/A   Occupational History  . retired    Social History Main Topics  . Smoking status: Former Smoker -- 0.10 packs/day for 1 years    Types: Cigarettes    Quit  date: 12/12/1962  . Smokeless tobacco: Never Used     Comment: only a few years  . Alcohol Use: Yes     Comment: rarely  . Drug Use: No  . Sexual Activity: Yes    Partners: Male   Other Topics Concern  . None   Social History Narrative   College grad. Married '61. 4 daughters, 2 grandchildren. 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      Advanced Directives: Has Living Will, HCPOA: Frances Ambrosino [phone]    Current outpatient prescriptions:aspirin 81 MG tablet, Take 81 mg by mouth daily., Disp: , Rfl: ;  EPINEPHrine (EPIPEN) 0.3 mg/0.3 mL SOAJ injection, Inject 0.3 mg into the muscle once as needed (for allergic reaction)., Disp: , Rfl: ;  estradiol (ESTRACE) 1 MG tablet, Take 0.5 mg by mouth at bedtime. , Disp: , Rfl: ;  hydrochlorothiazide (MICROZIDE) 12.5 MG capsule, Take 1 capsule (12.5 mg total) by mouth daily., Disp: 90 capsule, Rfl: 3 loratadine (CLARITIN) 10 MG tablet, Take 10 mg by mouth daily as needed for allergies., Disp: , Rfl: ;  LORazepam (ATIVAN) 0.5 MG tablet, Take 1 tablet (0.5 mg total) by mouth every 8 (eight) hours., Disp: 10 tablet, Rfl: 0;  OVER THE COUNTER MEDICATION, Take 1 packet by mouth daily. Dissolve one packet in a glass of water each morning. Emergen-C, Disp: , Rfl:  progesterone (PROMETRIUM) 100 MG capsule, Take 100 mg by mouth at bedtime. , Disp: , Rfl: ;  PARoxetine (PAXIL) 10 MG tablet, Take 2 tablets (20 mg total) by mouth daily., Disp: 60 tablet, Rfl: 3  EXAM:  Filed Vitals:   06/09/14 1257  BP: 108/70  Pulse: 67  Temp: 97.7 F (36.5 C)    Body mass index is 22.24 kg/(m^2).  GENERAL: vitals reviewed and listed above, alert, oriented, appears well hydrated and in no acute distress  HEENT: atraumatic, conjunttiva clear, no obvious abnormalities on inspection of external nose and ears  NECK: no obvious masses on inspection  LUNGS: clear to auscultation bilaterally, no wheezes, rales or rhonchi, good air movement  CV: HRRR, no  peripheral edema  MS: moves all extremities without noticeable abnormality  PSYCH: pleasant and cooperative, no obvious depression or anxiety  ASSESSMENT AND PLAN:  Discussed the following assessment and plan:  Generalized anxiety disorder - Plan: PARoxetine (PAXIL) 10 MG tablet  -discussed options -will try paxil and benzo prn -follow up in 4-6 weeks -Patient advised to return or notify a doctor immediately if symptoms worsen or persist or new concerns arise.  Patient Instructions  -start 10mg  daily of the paxil, can increase to 20mg  daily in 2 weeks if you wish  -continue to get as much exercise as you can  -consider counseling  -follow up in 4-6 week     KIM, HANNAH R.

## 2014-06-12 ENCOUNTER — Encounter: Payer: Self-pay | Admitting: Family Medicine

## 2014-06-12 ENCOUNTER — Ambulatory Visit (INDEPENDENT_AMBULATORY_CARE_PROVIDER_SITE_OTHER): Payer: Medicare Other | Admitting: Family Medicine

## 2014-06-12 VITALS — BP 124/78 | HR 65 | Temp 97.6°F | Ht 63.0 in | Wt 126.5 lb

## 2014-06-12 DIAGNOSIS — F411 Generalized anxiety disorder: Secondary | ICD-10-CM

## 2014-06-12 DIAGNOSIS — I10 Essential (primary) hypertension: Secondary | ICD-10-CM

## 2014-06-12 DIAGNOSIS — F410 Panic disorder [episodic paroxysmal anxiety] without agoraphobia: Secondary | ICD-10-CM

## 2014-06-12 NOTE — Patient Instructions (Signed)
Start the paxil today, ativan as needed for panic in the interim over the next month  Consider counseling or group  Follow up in 1 month

## 2014-06-12 NOTE — Progress Notes (Signed)
Pre visit review using our clinic review tool, if applicable. No additional management support is needed unless otherwise documented below in the visit note. 

## 2014-06-12 NOTE — Progress Notes (Signed)
No chief complaint on file.   HPI:  Acute visit for:  HTN: -felt like had pressure in ears this morning - husband is coming home home from rehab, bad dementia, and she has been stressed and anxious - felt like had some panic this morning and checked her blood pressure - was anxious and BP in 416L systolic - reports told husband needs 24/7 care and this makes her feel trapped, husband is refusing sitters/etc and is not aware of his limitations and is grumpy with her -Denies:CP, trouble breathing, HA, swelling, fevers, thoughts of self harm  ROS: See pertinent positives and negatives per HPI.  Past Medical History  Diagnosis Date  . Hemorrhoids, external   . Personal history of diseases of skin and subcutaneous tissue   . Other and unspecified hyperlipidemia   . Personal history of unspecified disease   . Need for prophylactic hormone replacement therapy (postmenopausal)   . Allergic rhinitis, cause unspecified   . Irritable bowel syndrome   . Contact dermatitis 03/16/2013  . Hypertension   . HEMORRHOIDS, EXTERNAL 02/07/2008    Qualifier: History of  By: [place], [place]    . Palpitations 02/19/2013    On-set of symptoms March '14. EKG with NSR     Past Surgical History  Procedure Laterality Date  . Benign moles removed    . Sebaceous cyst excised    . Hemorrhoid surgery    . Cataract extraction w/ intraocular lens implant Bilateral     right - Nov '11, left - Nov '13 [place]    Family History  Problem Relation Age of Onset  . Heart disease Father   . Cancer Sister     breast  . Heart disease Other   . Heart disease Other   . Heart disease Other   . Cancer Sister     breast  . Hypothyroidism Sister   . Asthma Sister   . Rheum arthritis Paternal Grandmother   . Rheum arthritis Daughter     History   Social History  . Marital Status: Married    Spouse Name: N/A    Number of Children: 4  . Years of Education: N/A   Occupational History  . retired     Social History Main Topics  . Smoking status: Former Smoker -- 0.10 packs/day for 1 years    Types: Cigarettes    Quit date: 12/12/1962  . Smokeless tobacco: Never Used     Comment: only a few years  . Alcohol Use: Yes     Comment: rarely  . Drug Use: No  . Sexual Activity: Yes    Partners: Male   Other Topics Concern  . None   Social History Narrative   College grad. Married '61. 4 daughters, 2 grandchildren. 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      Advanced Directives: Has Living Will, HCPOA: Precilla Purnell [phone]    Current outpatient prescriptions:aspirin 81 MG tablet, Take 81 mg by mouth daily., Disp: , Rfl: ;  EPINEPHrine (EPIPEN) 0.3 mg/0.3 mL SOAJ injection, Inject 0.3 mg into the muscle once as needed (for allergic reaction)., Disp: , Rfl: ;  estradiol (ESTRACE) 1 MG tablet, Take 0.5 mg by mouth at bedtime. , Disp: , Rfl: ;  hydrochlorothiazide (MICROZIDE) 12.5 MG capsule, Take 1 capsule (12.5 mg total) by mouth daily., Disp: 90 capsule, Rfl: 3 loratadine (CLARITIN) 10 MG tablet, Take 10 mg by mouth daily as needed for allergies., Disp: , Rfl: ;  LORazepam (  ATIVAN) 0.5 MG tablet, Take 1 tablet (0.5 mg total) by mouth every 8 (eight) hours., Disp: 10 tablet, Rfl: 0;  OVER THE COUNTER MEDICATION, Take 1 packet by mouth daily. Dissolve one packet in a glass of water each morning. Emergen-C, Disp: , Rfl:  PARoxetine (PAXIL) 10 MG tablet, Take 2 tablets (20 mg total) by mouth daily., Disp: 60 tablet, Rfl: 3;  progesterone (PROMETRIUM) 100 MG capsule, Take 100 mg by mouth at bedtime. , Disp: , Rfl:   EXAM:  Filed Vitals:   06/12/14 1307  BP: 124/78  Pulse: 65  Temp: 97.6 F (36.4 C)    Body mass index is 22.41 kg/(m^2).  GENERAL: vitals reviewed and listed above, alert, oriented, appears well hydrated and in no acute distress  HEENT: atraumatic, conjunttiva clear, no obvious abnormalities on inspection of external nose and ears  NECK: no obvious masses on  inspection  LUNGS: clear to auscultation bilaterally, no wheezes, rales or rhonchi, good air movement  CV: HRRR, no peripheral edema  MS: moves all extremities without noticeable abnormality  PSYCH: pleasant and cooperative, tearful  ASSESSMENT AND PLAN:  Discussed the following assessment and plan:  Essential hypertension  Generalized anxiety disorder  Panic disorder  -struggling with the decline in her husband's health with GAD -start paxil, prn benzo, advised counseling or demetia support group, Neurosurgeon or having daughters come on schedule to giv eher a break if possble -Patient advised to return or notify a doctor immediately if symptoms worsen or persist or new concerns arise.  Patient Instructions  Start the paxil today, ativan as needed for panic in the interim over the next month  Consider counseling or group  Follow up in 1 month         KIM, HANNAH R.

## 2014-07-15 ENCOUNTER — Ambulatory Visit (INDEPENDENT_AMBULATORY_CARE_PROVIDER_SITE_OTHER): Payer: Medicare Other | Admitting: Family Medicine

## 2014-07-15 ENCOUNTER — Encounter: Payer: Self-pay | Admitting: Family Medicine

## 2014-07-15 VITALS — BP 108/70 | HR 58 | Temp 97.8°F | Ht 63.0 in | Wt 130.5 lb

## 2014-07-15 DIAGNOSIS — F411 Generalized anxiety disorder: Secondary | ICD-10-CM | POA: Diagnosis not present

## 2014-07-15 DIAGNOSIS — I10 Essential (primary) hypertension: Secondary | ICD-10-CM

## 2014-07-15 NOTE — Progress Notes (Signed)
Pre visit review using our clinic review tool, if applicable. No additional management support is needed unless otherwise documented below in the visit note. 

## 2014-07-15 NOTE — Progress Notes (Signed)
No chief complaint on file.   HPI:  Follow up:  1)Anxiety and depression: -suddenly primary 24/7 sitter for husband 05/2014 -felt overwhelmed and somewhat trapped by this - but has been better then she anticipated -was on benzos when became my patient -started paxil 05/2014, advised counseling and scheduled breaks, ativan for rare use for panic attacks after lengthy discussion on risks and need for psychiatrist id daily or escalating use -reports: reports felt like in a tunnel after being on paxil for 1 day so stopped it; not doing any counseling, not using the ativan at all -denies:panic, hopeless thoughts, thoughts of self harm  2)HTN: -she is very anxious about her blood pressure and about BP medications -meds:asa, hctz 12.5 -reports: stopped taking BP it on a regular basis -denies: CP, SOB, palpitation  ROS: See pertinent positives and negatives per HPI.  Past Medical History  Diagnosis Date  . Hemorrhoids, external   . Personal history of diseases of skin and subcutaneous tissue   . Other and unspecified hyperlipidemia   . Personal history of unspecified disease   . Need for prophylactic hormone replacement therapy (postmenopausal)   . Allergic rhinitis, cause unspecified   . Irritable bowel syndrome   . Contact dermatitis 03/16/2013  . Hypertension   . HEMORRHOIDS, EXTERNAL 02/07/2008    Qualifier: History of  By: [place], [place]    . Palpitations 02/19/2013    On-set of symptoms March '14. EKG with NSR     Past Surgical History  Procedure Laterality Date  . Benign moles removed    . Sebaceous cyst excised    . Hemorrhoid surgery    . Cataract extraction w/ intraocular lens implant Bilateral     right - Nov '11, left - Nov '13 [place]    Family History  Problem Relation Age of Onset  . Heart disease Father   . Cancer Sister     breast  . Heart disease Other   . Heart disease Other   . Heart disease Other   . Cancer Sister     breast  .  Hypothyroidism Sister   . Asthma Sister   . Rheum arthritis Paternal Grandmother   . Rheum arthritis Daughter     History   Social History  . Marital Status: Married    Spouse Name: N/A    Number of Children: 4  . Years of Education: N/A   Occupational History  . retired    Social History Main Topics  . Smoking status: Former Smoker -- 0.10 packs/day for 1 years    Types: Cigarettes    Quit date: 12/12/1962  . Smokeless tobacco: Never Used     Comment: only a few years  . Alcohol Use: Yes     Comment: rarely  . Drug Use: No  . Sexual Activity: Yes    Partners: Male   Other Topics Concern  . None   Social History Narrative   College grad. Married '61. 4 daughters, 2 grandchildren. 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      Advanced Directives: Has Living Will, HCPOA: Persais Ethridge [phone]    Current outpatient prescriptions:aspirin 81 MG tablet, Take 81 mg by mouth daily., Disp: , Rfl: ;  EPINEPHrine (EPIPEN) 0.3 mg/0.3 mL SOAJ injection, Inject 0.3 mg into the muscle once as needed (for allergic reaction)., Disp: , Rfl: ;  estradiol (ESTRACE) 1 MG tablet, Take 0.5 mg by mouth at bedtime. , Disp: , Rfl: ;  hydrochlorothiazide (MICROZIDE)  12.5 MG capsule, Take 1 capsule (12.5 mg total) by mouth daily., Disp: 90 capsule, Rfl: 3 loratadine (CLARITIN) 10 MG tablet, Take 10 mg by mouth daily as needed for allergies., Disp: , Rfl: ;  OVER THE COUNTER MEDICATION, Take 1 packet by mouth daily. Dissolve one packet in a glass of water each morning. Emergen-C, Disp: , Rfl: ;  progesterone (PROMETRIUM) 100 MG capsule, Take 100 mg by mouth at bedtime. , Disp: , Rfl:   EXAM:  Filed Vitals:   07/15/14 0804  BP: 108/70  Pulse: 58  Temp: 97.8 F (36.6 C)    Body mass index is 23.12 kg/(m^2).  GENERAL: vitals reviewed and listed above, alert, oriented, appears well hydrated and in no acute distress  HEENT: atraumatic, conjunttiva clear, no obvious abnormalities on  inspection of external nose and ears  NECK: no obvious masses on inspection  LUNGS: clear to auscultation bilaterally, no wheezes, rales or rhonchi, good air movement  CV: HRRR, no peripheral edema  MS: moves all extremities without noticeable abnormality  PSYCH: pleasant and cooperative, no obvious depression or anxiety  ASSESSMENT AND PLAN:  Discussed the following assessment and plan:  Essential hypertension  Generalized anxiety disorder  -much improved -removed ativan and paxil from list -follow up in 3 months, fasting labs then -Patient advised to return or notify a doctor immediately if symptoms worsen or persist or new concerns arise.  There are no Patient Instructions on file for this visit.   Colin Benton R.

## 2014-09-26 ENCOUNTER — Other Ambulatory Visit: Payer: Self-pay

## 2014-09-28 DIAGNOSIS — Z23 Encounter for immunization: Secondary | ICD-10-CM | POA: Diagnosis not present

## 2014-10-05 ENCOUNTER — Encounter (HOSPITAL_COMMUNITY): Payer: Self-pay | Admitting: Emergency Medicine

## 2014-10-05 DIAGNOSIS — R200 Anesthesia of skin: Secondary | ICD-10-CM | POA: Diagnosis not present

## 2014-10-05 DIAGNOSIS — I10 Essential (primary) hypertension: Secondary | ICD-10-CM | POA: Diagnosis not present

## 2014-10-05 DIAGNOSIS — R42 Dizziness and giddiness: Secondary | ICD-10-CM | POA: Diagnosis not present

## 2014-10-05 NOTE — ED Notes (Signed)
C/o blood pressure is more elevated than usual, takes BP meds, reports high BP of 178/79 this evening, also reports L arm tingling and light headed. (denies: visual changes, HA, nvd, weakness, fever, sob or pain), took HCTZ and 1/4 of a 0.5mg  ativan. Alert, NAD, calm, interactive.

## 2014-10-06 ENCOUNTER — Ambulatory Visit (INDEPENDENT_AMBULATORY_CARE_PROVIDER_SITE_OTHER): Payer: Medicare Other | Admitting: Family Medicine

## 2014-10-06 ENCOUNTER — Emergency Department (HOSPITAL_COMMUNITY)
Admission: EM | Admit: 2014-10-06 | Discharge: 2014-10-06 | Discharge status: Against Medical Advice | Payer: Medicare Other | Attending: Emergency Medicine | Admitting: Emergency Medicine

## 2014-10-06 ENCOUNTER — Encounter: Payer: Self-pay | Admitting: Family Medicine

## 2014-10-06 VITALS — BP 128/80 | HR 72 | Temp 97.3°F | Ht 63.0 in | Wt 129.2 lb

## 2014-10-06 DIAGNOSIS — F411 Generalized anxiety disorder: Secondary | ICD-10-CM | POA: Diagnosis not present

## 2014-10-06 DIAGNOSIS — I10 Essential (primary) hypertension: Secondary | ICD-10-CM

## 2014-10-06 NOTE — Progress Notes (Signed)
No chief complaint on file.   HPI:  HTN: -very anxious patient - anxious about medications and about her BP -hx of ED visits for elevated BP and panic -hx of ? Dizziness with lisinopril and hctz when initially started, ? Rash with lisinopril/but on careful hx sounds like this was from detergent -on hydrochlorthiazide 12.5 mg daily -reports: had tingling and some pain in L arm after flu shot and was very stressed out and checked BP and was high (she talked to a neighbor whom is a Marine scientist and went to ED but left because felt this was her anxiety) - this has resolved -denies:   Anxiety: -has not tolerated several SSRIs - took ativan rarely for panic attacks but then stopped - she does have these on hand to use rarely -I advised her to see pscyh, but she was doing better last visit -anxiety has been worse for the few weeks   ROS: See pertinent positives and negatives per HPI.  Past Medical History  Diagnosis Date  . Hemorrhoids, external   . Personal history of diseases of skin and subcutaneous tissue   . Other and unspecified hyperlipidemia   . Personal history of unspecified disease   . Need for prophylactic hormone replacement therapy (postmenopausal)   . Allergic rhinitis, cause unspecified   . Irritable bowel syndrome   . Contact dermatitis 03/16/2013  . Hypertension   . HEMORRHOIDS, EXTERNAL 02/07/2008    Qualifier: History of  By: [place], [place]    . Palpitations 02/19/2013    On-set of symptoms March '14. EKG with NSR     Past Surgical History  Procedure Laterality Date  . Benign moles removed    . Sebaceous cyst excised    . Hemorrhoid surgery    . Cataract extraction w/ intraocular lens implant Bilateral     right - Nov '11, left - Nov '13 [place]    Family History  Problem Relation Age of Onset  . Heart disease Father   . Cancer Sister     breast  . Heart disease Other   . Heart disease Other   . Heart disease Other   . Cancer Sister     breast   . Hypothyroidism Sister   . Asthma Sister   . Rheum arthritis Paternal Grandmother   . Rheum arthritis Daughter     History   Social History  . Marital Status: Married    Spouse Name: N/A    Number of Children: 4  . Years of Education: N/A   Occupational History  . retired    Social History Main Topics  . Smoking status: Former Smoker -- 0.10 packs/day for 1 years    Types: Cigarettes    Quit date: 12/12/1962  . Smokeless tobacco: Never Used     Comment: only a few years  . Alcohol Use: Yes     Comment: rarely  . Drug Use: No  . Sexual Activity: Yes    Partners: Male   Other Topics Concern  . None   Social History Narrative   College grad. Married '61. 4 daughters, 2 grandchildren. 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      Advanced Directives: Has Living Will, HCPOA: Ayat Drenning [phone]    Current outpatient prescriptions:aspirin 81 MG tablet, Take 81 mg by mouth daily., Disp: , Rfl: ;  EPINEPHrine (EPIPEN) 0.3 mg/0.3 mL SOAJ injection, Inject 0.3 mg into the muscle once as needed (for allergic reaction)., Disp: , Rfl: ;  estradiol (ESTRACE) 1 MG tablet, Take 0.5 mg by mouth at bedtime. , Disp: , Rfl: ;  hydrochlorothiazide (MICROZIDE) 12.5 MG capsule, Take 1 capsule (12.5 mg total) by mouth daily., Disp: 90 capsule, Rfl: 3 Multiple Vitamin (MULTIVITAMIN) capsule, Take 1 capsule by mouth daily., Disp: , Rfl: ;  OVER THE COUNTER MEDICATION, Take 1 packet by mouth daily. Dissolve one packet in a glass of water each morning. Emergen-C, Disp: , Rfl: ;  progesterone (PROMETRIUM) 100 MG capsule, Take 100 mg by mouth at bedtime. , Disp: , Rfl: ;  vitamin B-12 (CYANOCOBALAMIN) 1000 MCG tablet, Take 1,000 mcg by mouth daily., Disp: , Rfl:   EXAM:  Filed Vitals:   10/06/14 1116  BP: 128/80  Pulse: 72  Temp: 97.3 F (36.3 C)    Body mass index is 22.89 kg/(m^2).  GENERAL: vitals reviewed and listed above, alert, oriented, appears well hydrated and in no acute  distress  HEENT: atraumatic, conjunttiva clear, no obvious abnormalities on inspection of external nose and ears  NECK: no obvious masses on inspection  LUNGS: clear to auscultation bilaterally, no wheezes, rales or rhonchi, good air movement  CV: HRRR, no peripheral edema  MS: moves all extremities without noticeable abnormality  PSYCH: pleasant and cooperative, no obvious depression or anxiety  ASSESSMENT AND PLAN:  Discussed the following assessment and plan:  Generalized anxiety disorder  Essential hypertension  ->25 minutes spent with this patient >50% in counseling: -asymptomatic today with normal BP -her anxiety is a big issues and we discussed this at length, she is leary of medications, but is interested in getting counseling and numbers provided to call and schedule -I advised her again that she should not check her BP during a panic attack and advised I am ok with her using that ativan prn for panic attacks after discussion risks and benefits but she really is reluctant to use medications -her arm pain/sensation was likely from the flu vaccine in that arm - we offered EKG today but this was declined and doubt cardiac -return and ED precautions -follow up 1-2 months or as needed -Patient advised to return or notify a doctor immediately if symptoms worsen or persist or new concerns arise.  Patient Instructions  -schedule counseling  -follow up in 1-2 months  -continue the hydrochlothiazide      Deaaron Fulghum R.

## 2014-10-06 NOTE — Progress Notes (Signed)
Pre visit review using our clinic review tool, if applicable. No additional management support is needed unless otherwise documented below in the visit note. 

## 2014-10-06 NOTE — ED Notes (Signed)
Unable to locate pt  

## 2014-10-06 NOTE — Patient Instructions (Signed)
-  schedule counseling  -follow up in 1-2 months  -continue the hydrochlothiazide

## 2014-10-21 ENCOUNTER — Ambulatory Visit (INDEPENDENT_AMBULATORY_CARE_PROVIDER_SITE_OTHER): Payer: Medicare Other | Admitting: Licensed Clinical Social Worker

## 2014-10-21 DIAGNOSIS — F411 Generalized anxiety disorder: Secondary | ICD-10-CM | POA: Diagnosis not present

## 2014-10-31 ENCOUNTER — Ambulatory Visit (INDEPENDENT_AMBULATORY_CARE_PROVIDER_SITE_OTHER): Payer: Medicare Other | Admitting: Family Medicine

## 2014-10-31 ENCOUNTER — Encounter: Payer: Self-pay | Admitting: Family Medicine

## 2014-10-31 VITALS — BP 100/62 | HR 69 | Temp 97.4°F | Ht 63.0 in | Wt 128.2 lb

## 2014-10-31 DIAGNOSIS — F411 Generalized anxiety disorder: Secondary | ICD-10-CM

## 2014-10-31 DIAGNOSIS — I10 Essential (primary) hypertension: Secondary | ICD-10-CM

## 2014-10-31 DIAGNOSIS — Z23 Encounter for immunization: Secondary | ICD-10-CM | POA: Diagnosis not present

## 2014-10-31 DIAGNOSIS — E785 Hyperlipidemia, unspecified: Secondary | ICD-10-CM | POA: Diagnosis not present

## 2014-10-31 DIAGNOSIS — L659 Nonscarring hair loss, unspecified: Secondary | ICD-10-CM | POA: Diagnosis not present

## 2014-10-31 NOTE — Patient Instructions (Addendum)
BEFORE YOU LEAVE: -Tdap -prevnar -schedule fasting lab appointment  We recommend the following healthy lifestyle measures: - eat a healthy diet consisting of lots of vegetables, fruits, beans, nuts, seeds, healthy meats such as white chicken and fish and whole grains.  - avoid fried foods, fast food, processed foods, sodas, red meet and other fattening foods.  - get a least 150 minutes of aerobic exercise per week.   If cholesterol still high will start a statin medication as we discussed

## 2014-10-31 NOTE — Progress Notes (Signed)
HPI:  HTN: History: she is leary of meds and it has been difficult to find anything she feels comfortable taking. She has not tolerated at various times: lisinopril, norvasc and combos. She has severe anxiety and has a habit of checking her BP excessively when stressed or having panic attack and then going to the ER. -meds: hctz 12.5 -reports: doing well -denies: CP, SOB, palpitations, swelling  GAD/Panic Disorder: History:she worries excessively about every and in particular her BP and initially was checking her BP repetitively - has ended up in the ER on several occassions with panic. I have advise cbt and pharmacotherapy - she is sensitive to medications and is very reluctant to take anything. Her husband is sick and she struggles with the change of caring for him. -seeing susan bond and is doing better per her report  HLD: -she is reluctant to do a medication -she has refused a statin in the past, but now is agreeable to doing a statin if still elevated   ROS: See pertinent positives and negatives per HPI.  Past Medical History  Diagnosis Date  . Hemorrhoids, external   . Personal history of diseases of skin and subcutaneous tissue   . Other and unspecified hyperlipidemia   . Personal history of unspecified disease   . Need for prophylactic hormone replacement therapy (postmenopausal)   . Allergic rhinitis, cause unspecified   . Irritable bowel syndrome   . Contact dermatitis 03/16/2013  . Hypertension   . HEMORRHOIDS, EXTERNAL 02/07/2008    Qualifier: History of  By: [place], [place]    . Palpitations 02/19/2013    On-set of symptoms March '14. EKG with NSR     Past Surgical History  Procedure Laterality Date  . Benign moles removed    . Sebaceous cyst excised    . Hemorrhoid surgery    . Cataract extraction w/ intraocular lens implant Bilateral     right - Nov '11, left - Nov '13 [place]    Family History  Problem Relation Age of Onset  . Heart disease  Father   . Cancer Sister     breast  . Heart disease Other   . Heart disease Other   . Heart disease Other   . Cancer Sister     breast  . Hypothyroidism Sister   . Asthma Sister   . Rheum arthritis Paternal Grandmother   . Rheum arthritis Daughter     History   Social History  . Marital Status: Married    Spouse Name: N/A    Number of Children: 4  . Years of Education: N/A   Occupational History  . retired    Social History Main Topics  . Smoking status: Former Smoker -- 0.10 packs/day for 1 years    Types: Cigarettes    Quit date: 12/12/1962  . Smokeless tobacco: Never Used     Comment: only a few years  . Alcohol Use: Yes     Comment: rarely  . Drug Use: No  . Sexual Activity:    Partners: Male   Other Topics Concern  . None   Social History Narrative   College grad. Married '61. 4 daughters, 2 grandchildren. 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      Advanced Directives: Has Living Will, HCPOA: Amaya Blakeman [phone]    Current outpatient prescriptions: EPINEPHrine (EPIPEN) 0.3 mg/0.3 mL SOAJ injection, Inject 0.3 mg into the muscle once as needed (for allergic reaction)., Disp: , Rfl: ;  estradiol (ESTRACE) 1 MG tablet, Take 0.5 mg by mouth at bedtime. , Disp: , Rfl: ;  hydrochlorothiazide (MICROZIDE) 12.5 MG capsule, Take 1 capsule (12.5 mg total) by mouth daily., Disp: 90 capsule, Rfl: 3 Multiple Vitamin (MULTIVITAMIN) capsule, Take 1 capsule by mouth daily., Disp: , Rfl: ;  OVER THE COUNTER MEDICATION, Take 1 packet by mouth daily. Dissolve one packet in a glass of water each morning. Emergen-C, Disp: , Rfl: ;  progesterone (PROMETRIUM) 100 MG capsule, Take 100 mg by mouth at bedtime. , Disp: , Rfl: ;  vitamin B-12 (CYANOCOBALAMIN) 1000 MCG tablet, Take 1,000 mcg by mouth daily., Disp: , Rfl:   EXAM:  Filed Vitals:   10/31/14 1018  BP: 100/62  Pulse: 69  Temp: 97.4 F (36.3 C)    Body mass index is 22.72 kg/(m^2).  GENERAL: vitals  reviewed and listed above, alert, oriented, appears well hydrated and in no acute distress  HEENT: atraumatic, conjunttiva clear, no obvious abnormalities on inspection of external nose and ears  NECK: no obvious masses on inspection  LUNGS: clear to auscultation bilaterally, no wheezes, rales or rhonchi, good air movement  CV: HRRR, no peripheral edema  MS: moves all extremities without noticeable abnormality  PSYCH: pleasant and cooperative, no obvious depression or anxiety  ASSESSMENT AND PLAN:  Discussed the following assessment and plan:  Essential hypertension - Plan: Basic metabolic panel  Generalized anxiety disorder - Plan: TSH  Hyperlipemia - Plan: Lipid Panel  Hair loss - Plan: TSH  -Patient advised to return or notify a doctor immediately if symptoms worsen or persist or new concerns arise.  Patient Instructions  BEFORE YOU LEAVE: -Tdap -prevnar -schedule fasting lab appointment  We recommend the following healthy lifestyle measures: - eat a healthy diet consisting of lots of vegetables, fruits, beans, nuts, seeds, healthy meats such as white chicken and fish and whole grains.  - avoid fried foods, fast food, processed foods, sodas, red meet and other fattening foods.  - get a least 150 minutes of aerobic exercise per week.       Colin Benton R.

## 2014-10-31 NOTE — Addendum Note (Signed)
Addended by: Daniesha Lawrence on: 10/31/2014 10:51 AM   Modules accepted: Orders

## 2014-10-31 NOTE — Progress Notes (Signed)
Pre visit review using our clinic review tool, if applicable. No additional management support is needed unless otherwise documented below in the visit note. 

## 2014-11-04 ENCOUNTER — Other Ambulatory Visit (INDEPENDENT_AMBULATORY_CARE_PROVIDER_SITE_OTHER): Payer: Medicare Other

## 2014-11-04 DIAGNOSIS — L659 Nonscarring hair loss, unspecified: Secondary | ICD-10-CM

## 2014-11-04 DIAGNOSIS — F411 Generalized anxiety disorder: Secondary | ICD-10-CM

## 2014-11-04 DIAGNOSIS — E785 Hyperlipidemia, unspecified: Secondary | ICD-10-CM | POA: Diagnosis not present

## 2014-11-04 DIAGNOSIS — I10 Essential (primary) hypertension: Secondary | ICD-10-CM

## 2014-11-04 LAB — LIPID PANEL
Cholesterol: 251 mg/dL — ABNORMAL HIGH (ref 0–200)
HDL: 62.4 mg/dL (ref >39.00)
LDL Cholesterol: 170 mg/dL — ABNORMAL HIGH (ref 0–99)
NonHDL: 188.6
Total CHOL/HDL Ratio: 4
Triglycerides: 95 mg/dL (ref 0.0–149.0)
VLDL: 19 mg/dL (ref 0.0–40.0)

## 2014-11-04 LAB — BASIC METABOLIC PANEL
BUN: 11 mg/dL (ref 6–23)
CALCIUM: 9.2 mg/dL (ref 8.4–10.5)
CO2: 27 mEq/L (ref 19–32)
Chloride: 102 mEq/L (ref 96–112)
Creatinine, Ser: 0.8 mg/dL (ref 0.4–1.2)
GFR: 77.3 mL/min (ref >60.00)
Glucose, Bld: 91 mg/dL (ref 70–99)
Potassium: 4.8 mEq/L (ref 3.5–5.1)
Sodium: 139 mEq/L (ref 135–145)

## 2014-11-04 LAB — TSH: TSH: 2.06 u[IU]/mL (ref 0.35–4.50)

## 2014-11-05 ENCOUNTER — Other Ambulatory Visit: Payer: Self-pay

## 2014-11-05 MED ORDER — PRAVASTATIN SODIUM 40 MG PO TABS: 40.0000 mg | ORAL_TABLET | Freq: Every day | ORAL | Status: DC

## 2014-11-07 ENCOUNTER — Ambulatory Visit: Payer: Medicare Other | Admitting: Family Medicine

## 2014-11-12 ENCOUNTER — Ambulatory Visit (INDEPENDENT_AMBULATORY_CARE_PROVIDER_SITE_OTHER): Payer: Medicare Other | Admitting: Licensed Clinical Social Worker

## 2014-11-12 DIAGNOSIS — F411 Generalized anxiety disorder: Secondary | ICD-10-CM

## 2014-11-28 ENCOUNTER — Telehealth: Payer: Self-pay | Admitting: Family Medicine

## 2014-11-28 DIAGNOSIS — Z1231 Encounter for screening mammogram for malignant neoplasm of breast: Secondary | ICD-10-CM | POA: Diagnosis not present

## 2014-11-28 DIAGNOSIS — Z1289 Encounter for screening for malignant neoplasm of other sites: Secondary | ICD-10-CM | POA: Diagnosis not present

## 2014-11-28 NOTE — Telephone Encounter (Signed)
I left a message for the pt to return my call. 

## 2014-11-28 NOTE — Telephone Encounter (Signed)
Patient informed. 

## 2014-11-28 NOTE — Telephone Encounter (Signed)
Does not sound like a side effect given was brief. Would advise she continue the cholesterol medication and if occurs again appointment to evaluated to see what is causing this.

## 2014-11-28 NOTE — Telephone Encounter (Signed)
Patient says she started having left arm tingling last night, took 81 mg of aspirin and the tingling went away.She said she thinks it's from the pravastatin and doesn't want to take it anymore.  Please advise

## 2014-12-01 ENCOUNTER — Encounter: Payer: Self-pay | Admitting: Family Medicine

## 2014-12-01 ENCOUNTER — Ambulatory Visit (INDEPENDENT_AMBULATORY_CARE_PROVIDER_SITE_OTHER): Payer: Medicare Other | Admitting: Family Medicine

## 2014-12-01 VITALS — BP 122/70 | HR 69 | Ht 63.0 in | Wt 128.8 lb

## 2014-12-01 DIAGNOSIS — R202 Paresthesia of skin: Secondary | ICD-10-CM

## 2014-12-01 DIAGNOSIS — E785 Hyperlipidemia, unspecified: Secondary | ICD-10-CM

## 2014-12-01 DIAGNOSIS — F411 Generalized anxiety disorder: Secondary | ICD-10-CM | POA: Diagnosis not present

## 2014-12-01 NOTE — Progress Notes (Signed)
Pre visit review using our clinic review tool, if applicable. No additional management support is needed unless otherwise documented below in the visit note. 

## 2014-12-01 NOTE — Progress Notes (Signed)
HPI:  Acute visit for:  Tingling sensation in L arm: -only after taking the statin and she is convince this is causing this -occurred briefly twice, tingling sensation in L arm at rest that resolves with her ativan and baby asa -denies: weakness, numbness, SOB, swelling in face or neck, pain in arm, neck pain, HA, vision changes, tingling elsewhere  ROS: See pertinent positives and negatives per HPI.  Past Medical History  Diagnosis Date  . Hemorrhoids, external   . Personal history of diseases of skin and subcutaneous tissue   . Other and unspecified hyperlipidemia   . Personal history of unspecified disease   . Need for prophylactic hormone replacement therapy (postmenopausal)   . Allergic rhinitis, cause unspecified   . Irritable bowel syndrome   . Contact dermatitis 03/16/2013  . Hypertension   . HEMORRHOIDS, EXTERNAL 02/07/2008    Qualifier: History of  By: [name], [name]    . Palpitations 02/19/2013    On-set of symptoms March '14. EKG with NSR     Past Surgical History  Procedure Laterality Date  . Benign moles removed    . Sebaceous cyst excised    . Hemorrhoid surgery    . Cataract extraction w/ intraocular lens implant Bilateral     right - Nov '11, left - Nov '13 [place]    Family History  Problem Relation Age of Onset  . Heart disease Father   . Cancer Sister     breast  . Heart disease Other   . Heart disease Other   . Heart disease Other   . Cancer Sister     breast  . Hypothyroidism Sister   . Asthma Sister   . Rheum arthritis Paternal Grandmother   . Rheum arthritis Daughter     History   Social History  . Marital Status: Married    Spouse Name: N/A    Number of Children: 4  . Years of Education: N/A   Occupational History  . retired    Social History Main Topics  . Smoking status: Former Smoker -- 0.10 packs/day for 1 years    Types: Cigarettes    Quit date: 12/12/1962  . Smokeless tobacco: Never Used     Comment: only  a few years  . Alcohol Use: Yes     Comment: rarely  . Drug Use: No  . Sexual Activity:    Partners: Male   Other Topics Concern  . None   Social History Narrative   College grad. Married '61. 4 daughters, 2 grandchildren. 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      Advanced Directives: Has Living Will, HCPOA: [name] [phone]    Current outpatient prescriptions: EPINEPHrine (EPIPEN) 0.3 mg/0.3 mL SOAJ injection, Inject 0.3 mg into the muscle once as needed (for allergic reaction)., Disp: , Rfl: ;  estradiol (ESTRACE) 1 MG tablet, Take 0.5 mg by mouth at bedtime. , Disp: , Rfl: ;  hydrochlorothiazide (MICROZIDE) 12.5 MG capsule, Take 1 capsule (12.5 mg total) by mouth daily., Disp: 90 capsule, Rfl: 3 Multiple Vitamin (MULTIVITAMIN) capsule, Take 1 capsule by mouth daily., Disp: , Rfl: ;  OVER THE COUNTER MEDICATION, Take 1 packet by mouth daily. Dissolve one packet in a glass of water each morning. Emergen-C, Disp: , Rfl: ;  progesterone (PROMETRIUM) 100 MG capsule, Take 100 mg by mouth at bedtime. , Disp: , Rfl:   EXAM:  Filed Vitals:   12/01/14 0956  BP: 122/70  Pulse: 69  Body mass index is 22.82 kg/(m^2).  GENERAL: vitals reviewed and listed above, alert, oriented, appears well hydrated and in no acute distress  HEENT: atraumatic, conjunttiva clear, no obvious abnormalities on inspection of external nose and ears  NECK: no obvious masses on inspection  LUNGS: clear to auscultation bilaterally, no wheezes, rales or rhonchi, good air movement, no carotid bruit  CV: HRRR, no peripheral edema  MS: moves all extremities without noticeable abnormality  NEURO: CN II-XII grossly intact, finger to nose normal, no pronator drift, normal sensation to light touch, DTRs and strength throughout in UE bilat, normal cap refill and pulses in UE bilat  PSYCH: pleasant and cooperative, no obvious depression or anxiety  ASSESSMENT AND PLAN:  Discussed the following  assessment and plan:  Hyperlipemia  Generalized anxiety disorder  Paresthesia -we discussed possible serious and likely etiologies, workup and treatment, treatment risks and return precautions - suspect anxiety but discussed CV causes, neuro causes, on asa 81 daily, BP well controlled, most likely anxiety given resolves with ativan -after this discussion, [name] opted for trial of statin as she is convinced this or anxiety related to this is the cause -follow up advised 6-8 weeks -of course, we advised [name]  to return or notify a doctor immediately if symptoms worsen or persist or new concerns arise.  .  -Patient advised to return or notify a doctor immediately if symptoms worsen or persist or new concerns arise.  Patient Instructions  -trial off statin medication to see if recurs  -increased cardiovascular exercise - 150 minutes per week  -if ok off medication for a few weeks see if can tolerate 1/2 dose  -follow up in in 6-8 weeks     KIM, [name] R.

## 2014-12-01 NOTE — Patient Instructions (Signed)
-  trial off statin medication to see if recurs  -increased cardiovascular exercise - 150 minutes per week  -if ok off medication for a few weeks see if can tolerate 1/2 dose  -follow up in in 6-8 weeks

## 2015-01-07 ENCOUNTER — Ambulatory Visit (INDEPENDENT_AMBULATORY_CARE_PROVIDER_SITE_OTHER): Payer: Medicare Other | Admitting: Licensed Clinical Social Worker

## 2015-01-07 DIAGNOSIS — F411 Generalized anxiety disorder: Secondary | ICD-10-CM | POA: Diagnosis not present

## 2015-02-02 ENCOUNTER — Encounter: Payer: Self-pay | Admitting: Family Medicine

## 2015-02-02 ENCOUNTER — Ambulatory Visit (INDEPENDENT_AMBULATORY_CARE_PROVIDER_SITE_OTHER): Payer: Medicare Other | Admitting: Family Medicine

## 2015-02-02 VITALS — BP 116/72 | HR 60 | Temp 97.5°F | Ht 63.0 in | Wt 126.2 lb

## 2015-02-02 DIAGNOSIS — E785 Hyperlipidemia, unspecified: Secondary | ICD-10-CM | POA: Diagnosis not present

## 2015-02-02 DIAGNOSIS — N951 Menopausal and female climacteric states: Secondary | ICD-10-CM | POA: Diagnosis not present

## 2015-02-02 DIAGNOSIS — I10 Essential (primary) hypertension: Secondary | ICD-10-CM

## 2015-02-02 DIAGNOSIS — F411 Generalized anxiety disorder: Secondary | ICD-10-CM | POA: Diagnosis not present

## 2015-02-02 DIAGNOSIS — R232 Flushing: Secondary | ICD-10-CM

## 2015-02-02 NOTE — Patient Instructions (Addendum)
BEFORE YOU LEAVE: -follow up in 3 months, come fasting but drink plenty of water that morning  We recommend the following healthy lifestyle measures: - eat a healthy diet consisting of lots of vegetables, fruits, beans, nuts, seeds, healthy meats such as white chicken and fish and whole grains.  - avoid fried foods, fast food, processed foods, sodas, red meet and other fattening foods.  - get a least 150 minutes of aerobic exercise per week.

## 2015-02-02 NOTE — Progress Notes (Signed)
Pre visit review using our clinic review tool, if applicable. No additional management support is needed unless otherwise documented below in the visit note. 

## 2015-02-02 NOTE — Progress Notes (Signed)
HPI:  HTN: History: she is leary of meds and it has been difficult to find anything she feels comfortable taking. She has not tolerated at various times: lisinopril, norvasc and combos. She has severe anxiety and has a habit of checking her BP excessively when stressed or having panic attack and then going to the ER. -meds: hctz 12.5 -reports: doing well, occ palpitations -denies: CP, SOB, swelling  GAD/Panic Disorder: History:she worries excessively about every and in particular her BP and initially was checking her BP repetitively - has ended up in the ER on several occassions with panic. I have advise cbt and pharmacotherapy - she is sensitive to medications and is very reluctant to take anything. Her husband is sick and she struggles with the change of caring for him. -seeing susan bond and is doing better per her past report  HLD: -she is reluctant to do a medication -she has refused a statin in the past, then tried it for one day and had tingling in arm so stopped it -reports: has been back on the 1/2 tablet of the statin for 3 weeks and is tolerating this well without tingling  Hot flashes: -sees gyn, on estradiol and prometrium  ROS: See pertinent positives and negatives per HPI.  Past Medical History  Diagnosis Date  . Hemorrhoids, external   . Personal history of diseases of skin and subcutaneous tissue   . Other and unspecified hyperlipidemia   . Personal history of unspecified disease   . Need for prophylactic hormone replacement therapy (postmenopausal)   . Allergic rhinitis, cause unspecified   . Irritable bowel syndrome   . Contact dermatitis 03/16/2013  . Hypertension   . HEMORRHOIDS, EXTERNAL 02/07/2008    Qualifier: History of  By: [place], [place]    . Palpitations 02/19/2013    On-set of symptoms March '14. EKG with NSR     Past Surgical History  Procedure Laterality Date  . Benign moles removed    . Sebaceous cyst excised    . Hemorrhoid surgery    .  Cataract extraction w/ intraocular lens implant Bilateral     right - Nov '11, left - Nov '13 [place]    Family History  Problem Relation Age of Onset  . Heart disease Father   . Cancer Sister     breast  . Heart disease Other   . Heart disease Other   . Heart disease Other   . Cancer Sister     breast  . Hypothyroidism Sister   . Asthma Sister   . Rheum arthritis Paternal Grandmother   . Rheum arthritis Daughter     History   Social History  . Marital Status: Married    Spouse Name: N/A  . Number of Children: 4  . Years of Education: N/A   Occupational History  . retired    Social History Main Topics  . Smoking status: Former Smoker -- 0.10 packs/day for 1 years    Types: Cigarettes    Quit date: 12/12/1962  . Smokeless tobacco: Never Used     Comment: only a few years  . Alcohol Use: Yes     Comment: rarely  . Drug Use: No  . Sexual Activity:    Partners: Male   Other Topics Concern  . None   Social History Narrative   College grad. Married '61. 4 daughters, 2 grandchildren. 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      Advanced Directives: Has Living Will, HCPOA: Lelon Frohlich  Nerine Pulse [phone]     Current outpatient prescriptions:  .  EPINEPHrine (EPIPEN) 0.3 mg/0.3 mL SOAJ injection, Inject 0.3 mg into the muscle once as needed (for allergic reaction)., Disp: , Rfl:  .  estradiol (ESTRACE) 1 MG tablet, Take 0.5 mg by mouth at bedtime. , Disp: , Rfl:  .  hydrochlorothiazide (MICROZIDE) 12.5 MG capsule, Take 1 capsule (12.5 mg total) by mouth daily., Disp: 90 capsule, Rfl: 3 .  Multiple Vitamin (MULTIVITAMIN) capsule, Take 1 capsule by mouth daily., Disp: , Rfl:  .  OVER THE COUNTER MEDICATION, Take 1 packet by mouth daily. Dissolve one packet in a glass of water each morning. Emergen-C, Disp: , Rfl:  .  pravastatin (PRAVACHOL) 40 MG tablet, Take 20 mg by mouth daily. , Disp: , Rfl: 5 .  progesterone (PROMETRIUM) 100 MG capsule, Take 100 mg by  mouth at bedtime. , Disp: , Rfl:  .  vitamin B-12 (CYANOCOBALAMIN) 1000 MCG tablet, Take 1,000 mcg by mouth daily., Disp: , Rfl:   EXAM:  Filed Vitals:   02/02/15 1003  BP: 116/72  Pulse: 60  Temp: 97.5 F (36.4 C)    Body mass index is 22.36 kg/(m^2).  GENERAL: vitals reviewed and listed above, alert, oriented, appears well hydrated and in no acute distress  HEENT: atraumatic, conjunttiva clear, no obvious abnormalities on inspection of external nose and ears  NECK: no obvious masses on inspection  LUNGS: clear to auscultation bilaterally, no wheezes, rales or rhonchi, good air movement  CV: HRRR, no peripheral edema  MS: moves all extremities without noticeable abnormality  PSYCH: pleasant and cooperative, no obvious depression or anxiety  ASSESSMENT AND PLAN:  Discussed the following assessment and plan:  Hyperlipemia  Essential hypertension  Generalized anxiety disorder  Hot flashes - Managed by Dr. Philis Pique - gets yearly female gyn exams  -cont statin and advised working on increasing exercise  -follow up with labs in 3 months -Patient advised to return or notify a doctor immediately if symptoms worsen or persist or new concerns arise.  Patient Instructions  BEFORE YOU LEAVE: -follow up in 3 months, come fasting but drink plenty of water that morning  We recommend the following healthy lifestyle measures: - eat a healthy diet consisting of lots of vegetables, fruits, beans, nuts, seeds, healthy meats such as white chicken and fish and whole grains.  - avoid fried foods, fast food, processed foods, sodas, red meet and other fattening foods.  - get a least 150 minutes of aerobic exercise per week.       Colin Benton R.

## 2015-02-17 ENCOUNTER — Encounter: Payer: Self-pay | Admitting: Family Medicine

## 2015-02-17 ENCOUNTER — Ambulatory Visit (INDEPENDENT_AMBULATORY_CARE_PROVIDER_SITE_OTHER): Payer: Medicare Other | Admitting: Family Medicine

## 2015-02-17 VITALS — BP 100/64 | HR 66 | Temp 97.5°F | Ht 63.0 in | Wt 125.9 lb

## 2015-02-17 DIAGNOSIS — H26493 Other secondary cataract, bilateral: Secondary | ICD-10-CM | POA: Diagnosis not present

## 2015-02-17 DIAGNOSIS — T63311A Toxic effect of venom of black widow spider, accidental (unintentional), initial encounter: Secondary | ICD-10-CM

## 2015-02-17 NOTE — Progress Notes (Signed)
HPI:  Red patch on arm: -R wrist, started suddenly after unpacking old records last night -started as 2 small bite marks - then swelling, redness, bruising, now improving -no CP, fevers, SOB, pain up the arm -washed area well -just had Tdap recently   ROS: See pertinent positives and negatives per HPI.  Past Medical History  Diagnosis Date  . Hemorrhoids, external   . Personal history of diseases of skin and subcutaneous tissue   . Other and unspecified hyperlipidemia   . Personal history of unspecified disease   . Need for prophylactic hormone replacement therapy (postmenopausal)   . Allergic rhinitis, cause unspecified   . Irritable bowel syndrome   . Contact dermatitis 03/16/2013  . Hypertension   . HEMORRHOIDS, EXTERNAL 02/07/2008    Qualifier: History of  By: [place], [place]    . Palpitations 02/19/2013    On-set of symptoms March '14. EKG with NSR     Past Surgical History  Procedure Laterality Date  . Benign moles removed    . Sebaceous cyst excised    . Hemorrhoid surgery    . Cataract extraction w/ intraocular lens implant Bilateral     right - Nov '11, left - Nov '13 [place]    Family History  Problem Relation Age of Onset  . Heart disease Father   . Cancer Sister     breast  . Heart disease Other   . Heart disease Other   . Heart disease Other   . Cancer Sister     breast  . Hypothyroidism Sister   . Asthma Sister   . Rheum arthritis Paternal Grandmother   . Rheum arthritis Daughter     History   Social History  . Marital Status: Married    Spouse Name: N/A  . Number of Children: 4  . Years of Education: N/A   Occupational History  . retired    Social History Main Topics  . Smoking status: Former Smoker -- 0.10 packs/day for 1 years    Types: Cigarettes    Quit date: 12/12/1962  . Smokeless tobacco: Never Used     Comment: only a few years  . Alcohol Use: Yes     Comment: rarely  . Drug Use: No  . Sexual Activity:   Partners: Male   Other Topics Concern  . None   Social History Narrative   College grad. Married '61. 4 daughters, 2 grandchildren. 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      Advanced Directives: Has Living Will, HCPOA: Christiann Hagerty [phone]     Current outpatient prescriptions:  .  EPINEPHrine (EPIPEN) 0.3 mg/0.3 mL SOAJ injection, Inject 0.3 mg into the muscle once as needed (for allergic reaction)., Disp: , Rfl:  .  estradiol (ESTRACE) 1 MG tablet, Take 0.5 mg by mouth at bedtime. , Disp: , Rfl:  .  hydrochlorothiazide (MICROZIDE) 12.5 MG capsule, Take 1 capsule (12.5 mg total) by mouth daily., Disp: 90 capsule, Rfl: 3 .  Multiple Vitamin (MULTIVITAMIN) capsule, Take 1 capsule by mouth daily., Disp: , Rfl:  .  OVER THE COUNTER MEDICATION, Take 1 packet by mouth daily. Dissolve one packet in a glass of water each morning. Emergen-C, Disp: , Rfl:  .  pravastatin (PRAVACHOL) 40 MG tablet, Take 20 mg by mouth daily. , Disp: , Rfl: 5 .  progesterone (PROMETRIUM) 100 MG capsule, Take 100 mg by mouth at bedtime. , Disp: , Rfl:  .  vitamin B-12 (CYANOCOBALAMIN) 1000 MCG  tablet, Take 1,000 mcg by mouth daily., Disp: , Rfl:   EXAM:  Filed Vitals:   02/17/15 1400  BP: 100/64  Pulse: 66  Temp: 97.5 F (36.4 C)    Body mass index is 22.31 kg/(m^2).  GENERAL: vitals reviewed and listed above, alert, oriented, appears well hydrated and in no acute distress  HEENT: atraumatic, conjunttiva clear, no obvious abnormalities on inspection of external nose and ears  NECK: no obvious masses on inspection  LUNGS: clear to auscultation bilaterally, no wheezes, rales or rhonchi, good air movement  CV: HRRR, no peripheral edema  SKIN: 2 small ite wounds with dried blood in center of area of erythema and bruising mild in area 5x5.5 cm in diameter on R wrist  MS: moves all extremities without noticeable abnormality  PSYCH: pleasant and cooperative, no obvious depression or  anxiety  ASSESSMENT AND PLAN:  Discussed the following assessment and plan:  Black widow spider bite, accidental or unintentional, initial encounter  -decision making of moderate intensity -discussed potential serious reactions and reasons to seek emergency care -home car einstructions -Patient advised to return or notify a doctor immediately if symptoms worsen or persist or new concerns arise.  Patient Instructions  Black Widow Spider Bite The adult female black widow spider has a black body about  inch (1.2 cm) long with an orange-red hourglass shape on her belly. Black widow spider bites can be serious and life-threatening. SYMPTOMS  Symptoms usually develop in 15 minutes to 2 hours after the bite. Symptoms usually peak in 3 hours to 1 day and may include:  Muscle cramps. These may occur anywhere in the body, including the abdomen.  Nausea and vomiting.  Dizziness.  Heavy sweating.  Shaking.  Restlessness.  Fever.  Trouble breathing.  Chest pain.  Swelling or a rash around the bite. TREATMENT  Serious reactions may require hospitalization. Your caregiver may give you an antivenom injection to help reduce pain. This is only done for very severe reactions that are not effectively treated with other medicines. There is a chance of having an allergic reaction to the antivenom since it is derived from horse serum. HOME CARE INSTRUCTIONS   Do not scratch the bite area. Keep the area clean and covered with an adhesive bandage or sterile gauze bandage.  Wash the area 3 times per day in warm, soapy water or as directed by your caregiver.  Put ice or cool compresses on the bite area.  Put ice in a plastic bag.  Place a towel between your skin and the bag.  Leave the ice on for 20 minutes, 4 times a day for the first 2 days or as directed.  Keep the bite area elevated above the level of your heart. This helps reduce redness and swelling.  Only take over-the-counter or  prescription medicines for pain, discomfort, or fever as directed by your caregiver.  Watch the bite area closely for worsening pain, swelling, redness, or pus. You may need a tetanus shot if:  You cannot remember when you had your last tetanus shot.  You have never had a tetanus shot.  The injury broke your skin. If you get a tetanus shot, your arm may swell, get red, and feel warm to the touch. This is common and not a problem. If you need a tetanus shot and you choose not to have one, there is a rare chance of getting tetanus. Sickness from tetanus can be serious. SEEK IMMEDIATE MEDICAL CARE IF:   You have muscle  cramps or abdominal pain or cramps.  You develop rapid breathing or a rapid heartbeat.  You become extremely restless or confused.  You have chest pain.  You feel faint, lightheaded, or you feel generally ill (malaise).  You develop pain, soreness, redness, swelling, or drainage from the bite.  You have a fever. MAKE SURE YOU:   Understand these instructions.  Will watch your condition.  Will get help right away if you are not doing well or get worse. Document Released: 11/28/2005 Document Revised: 02/20/2012 Document Reviewed: 06/29/2011 [facility] Patient Information 2015 [place], [place]. This information is not intended to replace advice given to you by your health care provider. Make sure you discuss any questions you have with your health care provider.      Colin Benton R.

## 2015-02-17 NOTE — Patient Instructions (Signed)
Black Widow Spider Bite The adult female black widow spider has a black body about  inch (1.2 cm) long with an orange-red hourglass shape on her belly. Black widow spider bites can be serious and life-threatening. SYMPTOMS  Symptoms usually develop in 15 minutes to 2 hours after the bite. Symptoms usually peak in 3 hours to 1 day and may include:  Muscle cramps. These may occur anywhere in the body, including the abdomen.  Nausea and vomiting.  Dizziness.  Heavy sweating.  Shaking.  Restlessness.  Fever.  Trouble breathing.  Chest pain.  Swelling or a rash around the bite. TREATMENT  Serious reactions may require hospitalization. Your caregiver may give you an antivenom injection to help reduce pain. This is only done for very severe reactions that are not effectively treated with other medicines. There is a chance of having an allergic reaction to the antivenom since it is derived from horse serum. HOME CARE INSTRUCTIONS   Do not scratch the bite area. Keep the area clean and covered with an adhesive bandage or sterile gauze bandage.  Wash the area 3 times per day in warm, soapy water or as directed by your caregiver.  Put ice or cool compresses on the bite area.  Put ice in a plastic bag.  Place a towel between your skin and the bag.  Leave the ice on for 20 minutes, 4 times a day for the first 2 days or as directed.  Keep the bite area elevated above the level of your heart. This helps reduce redness and swelling.  Only take over-the-counter or prescription medicines for pain, discomfort, or fever as directed by your caregiver.  Watch the bite area closely for worsening pain, swelling, redness, or pus. You may need a tetanus shot if:  You cannot remember when you had your last tetanus shot.  You have never had a tetanus shot.  The injury broke your skin. If you get a tetanus shot, your arm may swell, get red, and feel warm to the touch. This is common and not a  problem. If you need a tetanus shot and you choose not to have one, there is a rare chance of getting tetanus. Sickness from tetanus can be serious. SEEK IMMEDIATE MEDICAL CARE IF:   You have muscle cramps or abdominal pain or cramps.  You develop rapid breathing or a rapid heartbeat.  You become extremely restless or confused.  You have chest pain.  You feel faint, lightheaded, or you feel generally ill (malaise).  You develop pain, soreness, redness, swelling, or drainage from the bite.  You have a fever. MAKE SURE YOU:   Understand these instructions.  Will watch your condition.  Will get help right away if you are not doing well or get worse. Document Released: 11/28/2005 Document Revised: 02/20/2012 Document Reviewed: 06/29/2011 [facility] Patient Information 2015 [place], [place]. This information is not intended to replace advice given to you by your health care provider. Make sure you discuss any questions you have with your health care provider.

## 2015-02-17 NOTE — Progress Notes (Signed)
Pre visit review using our clinic review tool, if applicable. No additional management support is needed unless otherwise documented below in the visit note. 

## 2015-04-01 ENCOUNTER — Ambulatory Visit (INDEPENDENT_AMBULATORY_CARE_PROVIDER_SITE_OTHER): Payer: Medicare Other | Admitting: Adult Health

## 2015-04-01 ENCOUNTER — Encounter: Payer: Self-pay | Admitting: Adult Health

## 2015-04-01 VITALS — BP 130/80 | Temp 97.8°F | Wt 125.0 lb

## 2015-04-01 DIAGNOSIS — W57XXXA Bitten or stung by nonvenomous insect and other nonvenomous arthropods, initial encounter: Secondary | ICD-10-CM | POA: Diagnosis not present

## 2015-04-01 DIAGNOSIS — T148 Other injury of unspecified body region: Secondary | ICD-10-CM | POA: Diagnosis not present

## 2015-04-01 NOTE — Progress Notes (Signed)
   Subjective:    Patient ID: [name], female    DOB: October 27, 1936, 79 y.o.   MRN: [id number]  HPI [name] presents to the office today for a possible bug bite on the back of her neck. She first noticed this on Sunday evening when her only complaint was itching. Itching continues to be her CC today in the office. Her husband believes that the insect bite has increased in size. She denies any pain, warmness, or drainage.   Review of Systems  Constitutional: Negative.   Skin: Positive for wound. Negative for color change, pallor and rash.  All other systems reviewed and are negative.      Objective:   Physical Exam  Constitutional: She is oriented to person, place, and time. She appears well-developed and well-nourished. No distress.  HENT:  Mouth/Throat: Oropharynx is clear and moist. No oropharyngeal exudate.  Lymphadenopathy:    She has no cervical adenopathy.  Neurological: She is alert and oriented to person, place, and time.  Skin: Skin is warm and dry.  5cm x 3 cm raised hard papule midline neck. Red irritation circling wound. It is not warm or tender to touch. Does not appear infected at this time.   Psychiatric: She has a normal mood and affect. Her behavior is normal. Judgment and thought content normal.  Nursing note and vitals reviewed.      Assessment & Plan:  1. Insect bite - Does not appear infected at this time.  - Advised to continue to apply neosporin on wound and use OTC Benadryl or Calamine lotion for purities.  - Can apply heat pack or ice pack for comfort.  - .She was educated on the signs and symptoms of infection and advised to follow up if she noticed any.

## 2015-04-01 NOTE — Progress Notes (Signed)
Pre visit review using our clinic review tool, if applicable. No additional management support is needed unless otherwise documented below in the visit note. 

## 2015-04-01 NOTE — Patient Instructions (Signed)
As we discussed, please follow up if you notice that the wound is getting bigger, it is warm and tender to touch or if you notice any pus drainage. I would continue to use the neosporin cream, some OTC benadryl or calamine lotion for itching and a hot compress. Also, use bug spray while you are in the garden. It was great meeting you!    Insect Bite Mosquitoes, flies, fleas, bedbugs, and many other insects can bite. Insect bites are different from insect stings. A sting is when venom is injected into the skin. Some insect bites can transmit infectious diseases. SYMPTOMS  Insect bites usually turn red, swell, and itch for 2 to 4 days. They often go away on their own. TREATMENT  Your caregiver may prescribe antibiotic medicines if a bacterial infection develops in the bite. HOME CARE INSTRUCTIONS  Do not scratch the bite area.  Keep the bite area clean and dry. Wash the bite area thoroughly with soap and water.  Put ice or cool compresses on the bite area.  Put ice in a plastic bag.  Place a towel between your skin and the bag.  Leave the ice on for 20 minutes, 4 times a day for the first 2 to 3 days, or as directed.  You may apply a baking soda paste, cortisone cream, or calamine lotion to the bite area as directed by your caregiver. This can help reduce itching and swelling.  Only take over-the-counter or prescription medicines as directed by your caregiver.  If you are given antibiotics, take them as directed. Finish them even if you start to feel better. You may need a tetanus shot if:  You cannot remember when you had your last tetanus shot.  You have never had a tetanus shot.  The injury broke your skin. If you get a tetanus shot, your arm may swell, get red, and feel warm to the touch. This is common and not a problem. If you need a tetanus shot and you choose not to have one, there is a rare chance of getting tetanus. Sickness from tetanus can be serious. SEEK IMMEDIATE  MEDICAL CARE IF:   You have increased pain, redness, or swelling in the bite area.  You see a red line on the skin coming from the bite.  You have a fever.  You have joint pain.  You have a headache or neck pain.  You have unusual weakness.  You have a rash.  You have chest pain or shortness of breath.  You have abdominal pain, nausea, or vomiting.  You feel unusually tired or sleepy. MAKE SURE YOU:   Understand these instructions.  Will watch your condition.  Will get help right away if you are not doing well or get worse. Document Released: 01/05/2005 Document Revised: 02/20/2012 Document Reviewed: 06/29/2011 [facility] Patient Information 2015 [place], [place]. This information is not intended to replace advice given to you by your health care provider. Make sure you discuss any questions you have with your health care provider.

## 2015-04-16 ENCOUNTER — Ambulatory Visit (INDEPENDENT_AMBULATORY_CARE_PROVIDER_SITE_OTHER): Payer: Medicare Other | Admitting: Family Medicine

## 2015-04-16 ENCOUNTER — Encounter: Payer: Self-pay | Admitting: Family Medicine

## 2015-04-16 VITALS — BP 138/72 | HR 68 | Temp 97.3°F | Ht 63.0 in | Wt 129.2 lb

## 2015-04-16 DIAGNOSIS — L989 Disorder of the skin and subcutaneous tissue, unspecified: Secondary | ICD-10-CM | POA: Diagnosis not present

## 2015-04-16 NOTE — Progress Notes (Signed)
HPI:  Acute visit for:  Skin Lesion: -seen by another provider here -per review of notes seem had a fairly sig local reaction with 5x3cm area of induration and erythema -today she reports:improving but still with little red bump here -denies: pain now, malaise, fevers  ROS: See pertinent positives and negatives per HPI.  Past Medical History  Diagnosis Date  . Hemorrhoids, external   . Personal history of diseases of skin and subcutaneous tissue   . Other and unspecified hyperlipidemia   . Personal history of unspecified disease   . Need for prophylactic hormone replacement therapy (postmenopausal)   . Allergic rhinitis, cause unspecified   . Irritable bowel syndrome   . Contact dermatitis 03/16/2013  . Hypertension   . HEMORRHOIDS, EXTERNAL 02/07/2008    Qualifier: History of  By: [place], [place]    . Palpitations 02/19/2013    On-set of symptoms March '14. EKG with NSR     Past Surgical History  Procedure Laterality Date  . Benign moles removed    . Sebaceous cyst excised    . Hemorrhoid surgery    . Cataract extraction w/ intraocular lens implant Bilateral     right - Nov '11, left - Nov '13 [place]    Family History  Problem Relation Age of Onset  . Heart disease Father   . Cancer Sister     breast  . Heart disease Other   . Heart disease Other   . Heart disease Other   . Cancer Sister     breast  . Hypothyroidism Sister   . Asthma Sister   . Rheum arthritis Paternal Grandmother   . Rheum arthritis Daughter     History   Social History  . Marital Status: Married    Spouse Name: N/A  . Number of Children: 4  . Years of Education: N/A   Occupational History  . retired    Social History Main Topics  . Smoking status: Former Smoker -- 0.10 packs/day for 1 years    Types: Cigarettes    Quit date: 12/12/1962  . Smokeless tobacco: Never Used     Comment: only a few years  . Alcohol Use: Yes     Comment: rarely  . Drug Use: No  . Sexual  Activity:    Partners: Male   Other Topics Concern  . None   Social History Narrative   College grad. Married '61. 4 daughters, 2 grandchildren. 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      Advanced Directives: Has Living Will, HCPOA: Anel Creighton [phone]     Current outpatient prescriptions:  .  EPINEPHrine (EPIPEN) 0.3 mg/0.3 mL SOAJ injection, Inject 0.3 mg into the muscle once as needed (for allergic reaction)., Disp: , Rfl:  .  estradiol (ESTRACE) 1 MG tablet, Take 0.5 mg by mouth at bedtime. , Disp: , Rfl:  .  hydrochlorothiazide (MICROZIDE) 12.5 MG capsule, Take 1 capsule (12.5 mg total) by mouth daily., Disp: 90 capsule, Rfl: 3 .  Multiple Vitamin (MULTIVITAMIN) capsule, Take 1 capsule by mouth daily., Disp: , Rfl:  .  OVER THE COUNTER MEDICATION, Take 1 packet by mouth daily. Dissolve one packet in a glass of water each morning. Emergen-C, Disp: , Rfl:  .  pravastatin (PRAVACHOL) 40 MG tablet, Take 20 mg by mouth daily. , Disp: , Rfl: 5 .  progesterone (PROMETRIUM) 100 MG capsule, Take 100 mg by mouth at bedtime. , Disp: , Rfl:  .  vitamin B-12 (  CYANOCOBALAMIN) 1000 MCG tablet, Take 1,000 mcg by mouth daily., Disp: , Rfl:   EXAM:  Filed Vitals:   04/16/15 0953  BP: 138/72  Pulse: 68  Temp: 97.3 F (36.3 C)    Body mass index is 22.89 kg/(m^2).  GENERAL: vitals reviewed and listed above, alert, oriented, appears well hydrated and in no acute distress  HEENT: atraumatic, conjunttiva clear, no obvious abnormalities on inspection of external nose and ears  NECK: no obvious masses on inspection  LUNGS: clear to auscultation bilaterally, no wheezes, rales or rhonchi, good air movement  CV: HRRR, no peripheral edema  SKIN: small subcutaneous nodule on lower posterior neck with small area of overlying erythema approx 60mm in diameter  MS: moves all extremities without noticeable abnormality  PSYCH: pleasant and cooperative, no obvious depression or  anxiety  ASSESSMENT AND PLAN:  Discussed the following assessment and plan:  Skin lesion  -suspect inflamed sabceous cyst given hx and exam findings today, resolving -advised supportive care, compresses and follow up with derm at her upcoming annual exam if persists -Patient advised to return or notify a doctor immediately if symptoms worsen or persist or new concerns arise.  There are no Patient Instructions on file for this visit.   Colin Benton R.

## 2015-04-16 NOTE — Progress Notes (Signed)
Pre visit review using our clinic review tool, if applicable. No additional management support is needed unless otherwise documented below in the visit note. 

## 2015-05-04 ENCOUNTER — Ambulatory Visit (INDEPENDENT_AMBULATORY_CARE_PROVIDER_SITE_OTHER): Payer: Medicare Other | Admitting: Family Medicine

## 2015-05-04 ENCOUNTER — Encounter: Payer: Self-pay | Admitting: Family Medicine

## 2015-05-04 VITALS — BP 112/78 | HR 54 | Temp 97.6°F | Ht 63.0 in | Wt 129.0 lb

## 2015-05-04 DIAGNOSIS — E785 Hyperlipidemia, unspecified: Secondary | ICD-10-CM

## 2015-05-04 DIAGNOSIS — N951 Menopausal and female climacteric states: Secondary | ICD-10-CM | POA: Diagnosis not present

## 2015-05-04 DIAGNOSIS — R232 Flushing: Secondary | ICD-10-CM

## 2015-05-04 DIAGNOSIS — I10 Essential (primary) hypertension: Secondary | ICD-10-CM

## 2015-05-04 DIAGNOSIS — F411 Generalized anxiety disorder: Secondary | ICD-10-CM | POA: Diagnosis not present

## 2015-05-04 LAB — LIPID PANEL
CHOL/HDL RATIO: 3
CHOLESTEROL: 195 mg/dL (ref 0–200)
HDL: 71.4 mg/dL (ref >39.00)
LDL Cholesterol: 107 mg/dL — ABNORMAL HIGH (ref 0–99)
NONHDL: 123.6
Triglycerides: 83 mg/dL (ref 0.0–149.0)
VLDL: 16.6 mg/dL (ref 0.0–40.0)

## 2015-05-04 LAB — BASIC METABOLIC PANEL
BUN: 17 mg/dL (ref 6–23)
CO2: 27 mEq/L (ref 19–32)
CREATININE: 0.78 mg/dL (ref 0.40–1.20)
Calcium: 9.1 mg/dL (ref 8.4–10.5)
Chloride: 103 mEq/L (ref 96–112)
GFR: 76.06 mL/min (ref >60.00)
Glucose, Bld: 94 mg/dL (ref 70–99)
POTASSIUM: 4 meq/L (ref 3.5–5.1)
Sodium: 137 mEq/L (ref 135–145)

## 2015-05-04 NOTE — Progress Notes (Signed)
HPI:  HTN: History: she is leary of meds and it has been difficult to find anything she feels comfortable taking. She has not tolerated at various times: lisinopril, norvasc and combos. She has significant anxiety and has a habit of checking her BP excessively when stressed or having panic attack and then going to the ER. -meds: hctz 12.5 -reports: doing well, occ palpitations -denies: CP, SOB, swelling  GAD/Panic Disorder: History:she worries excessively about everything and in particular her BP and initially was checking her BP repetitively - has ended up in the ER on several occassions with panic. I have advise cbt and pharmacotherapy - she is sensitive to medications and is very reluctant to take anything. Her husband is sick and she struggles with the change of caring for him. -was seeing susan bond and is doing better per her past report  HLD: -she is reluctant to do a medication -she has refused a statin in the past, then tried it for one day and had tingling in arm so stopped it -walking on a regular basis and eating healthy -reports: now back on 1/2 tablet of pravastatin 40  Hot flashes: -sees gyn, on estradiol and prometrium  ROS: See pertinent positives and negatives per HPI.  Past Medical History  Diagnosis Date  . Hemorrhoids, external   . Personal history of diseases of skin and subcutaneous tissue   . Other and unspecified hyperlipidemia   . Personal history of unspecified disease   . Need for prophylactic hormone replacement therapy (postmenopausal)   . Allergic rhinitis, cause unspecified   . Irritable bowel syndrome   . Contact dermatitis 03/16/2013  . Hypertension   . HEMORRHOIDS, EXTERNAL 02/07/2008    Qualifier: History of  By: [place], [place]    . Palpitations 02/19/2013    On-set of symptoms March '14. EKG with NSR     Past Surgical History  Procedure Laterality Date  . Benign moles removed    . Sebaceous cyst excised    . Hemorrhoid surgery    .  Cataract extraction w/ intraocular lens implant Bilateral     right - Nov '11, left - Nov '13 [place]    Family History  Problem Relation Age of Onset  . Heart disease Father   . Cancer Sister     breast  . Heart disease Other   . Heart disease Other   . Heart disease Other   . Cancer Sister     breast  . Hypothyroidism Sister   . Asthma Sister   . Rheum arthritis Paternal Grandmother   . Rheum arthritis Daughter     History   Social History  . Marital Status: Married    Spouse Name: N/A  . Number of Children: 4  . Years of Education: N/A   Occupational History  . retired    Social History Main Topics  . Smoking status: Former Smoker -- 0.10 packs/day for 1 years    Types: Cigarettes    Quit date: 12/12/1962  . Smokeless tobacco: Never Used     Comment: only a few years  . Alcohol Use: Yes     Comment: rarely  . Drug Use: No  . Sexual Activity:    Partners: Male   Other Topics Concern  . None   Social History Narrative   College grad. Married '61. 4 daughters, 2 grandchildren. 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      Advanced Directives: Has Living Will, HCPOA: Shan Levans Andreas [phone]  Current outpatient prescriptions:  .  EPINEPHrine (EPIPEN) 0.3 mg/0.3 mL SOAJ injection, Inject 0.3 mg into the muscle once as needed (for allergic reaction)., Disp: , Rfl:  .  estradiol (ESTRACE) 1 MG tablet, Take 0.5 mg by mouth at bedtime. , Disp: , Rfl:  .  hydrochlorothiazide (MICROZIDE) 12.5 MG capsule, Take 1 capsule (12.5 mg total) by mouth daily., Disp: 90 capsule, Rfl: 3 .  Multiple Vitamin (MULTIVITAMIN) capsule, Take 1 capsule by mouth daily., Disp: , Rfl:  .  OVER THE COUNTER MEDICATION, Take 1 packet by mouth daily. Dissolve one packet in a glass of water each morning. Emergen-C, Disp: , Rfl:  .  pravastatin (PRAVACHOL) 40 MG tablet, Take 20 mg by mouth daily. , Disp: , Rfl: 5 .  progesterone (PROMETRIUM) 100 MG capsule, Take 100 mg by  mouth at bedtime. , Disp: , Rfl:  .  vitamin B-12 (CYANOCOBALAMIN) 1000 MCG tablet, Take 1,000 mcg by mouth daily., Disp: , Rfl:   EXAM:  Filed Vitals:   05/04/15 0840  BP: 112/78  Pulse: 54  Temp: 97.6 F (36.4 C)    Body mass index is 22.86 kg/(m^2).  GENERAL: vitals reviewed and listed above, alert, oriented, appears well hydrated and in no acute distress  HEENT: atraumatic, conjunttiva clear, no obvious abnormalities on inspection of external nose and ears  NECK: no obvious masses on inspection  LUNGS: clear to auscultation bilaterally, no wheezes, rales or rhonchi, good air movement  CV: HRRR, no peripheral edema  MS: moves all extremities without noticeable abnormality  PSYCH: pleasant and cooperative, no obvious depression or anxiety  ASSESSMENT AND PLAN:  Discussed the following assessment and plan:  Essential hypertension - Plan: Basic metabolic panel  Hyperlipemia - Plan: Lipid Panel  Generalized anxiety disorder  Hot flashes - Managed by Dr. Philis Pique - gets yearly female gyn exams  -FASTING labs -lifestyle recs -Patient advised to return or notify a doctor immediately if symptoms worsen or persist or new concerns arise.  There are no Patient Instructions on file for this visit.   Colin Benton R.

## 2015-05-04 NOTE — Patient Instructions (Signed)
BEFORE YOUR LEAVE: -labs -follow up in 4 months  We recommend the following healthy lifestyle measures: - eat a healthy diet consisting of lots of vegetables, fruits, beans, nuts, seeds, healthy meats such as white chicken and fish and whole grains.  - avoid fried foods, fast food, processed foods, sodas, red meet and other fattening foods.  - get a least 150 minutes of aerobic exercise per week.

## 2015-05-04 NOTE — Progress Notes (Signed)
Pre visit review using our clinic review tool, if applicable. No additional management support is needed unless otherwise documented below in the visit note. 

## 2015-05-18 ENCOUNTER — Other Ambulatory Visit: Payer: Self-pay | Admitting: *Deleted

## 2015-05-18 DIAGNOSIS — I10 Essential (primary) hypertension: Secondary | ICD-10-CM

## 2015-05-18 MED ORDER — PRAVASTATIN SODIUM 40 MG PO TABS: 20.0000 mg | ORAL_TABLET | Freq: Every day | ORAL | Status: DC

## 2015-05-18 MED ORDER — HYDROCHLOROTHIAZIDE 12.5 MG PO CAPS: 12.5000 mg | ORAL_CAPSULE | Freq: Every day | ORAL | Status: DC

## 2015-05-20 ENCOUNTER — Other Ambulatory Visit: Payer: Self-pay | Admitting: Family Medicine

## 2015-05-21 ENCOUNTER — Encounter: Payer: Self-pay | Admitting: Family Medicine

## 2015-05-21 ENCOUNTER — Ambulatory Visit (INDEPENDENT_AMBULATORY_CARE_PROVIDER_SITE_OTHER): Payer: Medicare Other | Admitting: Family Medicine

## 2015-05-21 VITALS — BP 118/80 | HR 81 | Temp 97.7°F | Ht 63.0 in | Wt 128.4 lb

## 2015-05-21 DIAGNOSIS — E785 Hyperlipidemia, unspecified: Secondary | ICD-10-CM

## 2015-05-21 DIAGNOSIS — F411 Generalized anxiety disorder: Secondary | ICD-10-CM | POA: Diagnosis not present

## 2015-05-21 DIAGNOSIS — I10 Essential (primary) hypertension: Secondary | ICD-10-CM | POA: Diagnosis not present

## 2015-05-21 NOTE — Progress Notes (Addendum)
HPI:  HLD: -pravastatin 1/2 tablet daily (20mg ) -walking on a regular basis and eating healthy -she thinks might be causing muscle pain in upper legs which started with caring for her husband and helping him up the stairs and stopped when she stopped the statin for one dose -denies falls, weakness, numbness, bowel or bladder incontinence  HTN: History: she is leary of meds and it has been difficult to find anything she feels comfortable taking. She has not tolerated at various times: lisinopril, norvasc and combos. She has significant anxiety and has a habit of checking her BP excessively when stressed or having panic attack and then going to the ER. -meds: hctz 12.5 -reports: doing well, occ palpitations -denies: CP, SOB, swelling  GAD/Panic Disorder: History:she worries excessively about everything and in particular her BP and initially was checking her BP repetitively - has ended up in the ER on several occassions with panic. I have advise cbt and pharmacotherapy - she is sensitive to medications and is very reluctant to take anything. Her husband is sick and she struggles with the change of caring for him. -was seeing susan bond and is doing better per her past report  Hot flashes: -sees gyn, on estradiol and prometrium  ROS: See pertinent positives and negatives per HPI.  Past Medical History  Diagnosis Date  . Hemorrhoids, external   . Personal history of diseases of skin and subcutaneous tissue   . Other and unspecified hyperlipidemia   . Personal history of unspecified disease   . Need for prophylactic hormone replacement therapy (postmenopausal)   . Allergic rhinitis, cause unspecified   . Irritable bowel syndrome   . Contact dermatitis 03/16/2013  . Hypertension   . HEMORRHOIDS, EXTERNAL 02/07/2008    Qualifier: History of  By: [place], [place]    . Palpitations 02/19/2013    On-set of symptoms March '14. EKG with NSR     Past Surgical History  Procedure Laterality  Date  . Benign moles removed    . Sebaceous cyst excised    . Hemorrhoid surgery    . Cataract extraction w/ intraocular lens implant Bilateral     right - Nov '11, left - Nov '13 [place]    Family History  Problem Relation Age of Onset  . Heart disease Father   . Cancer Sister     breast  . Heart disease Other   . Heart disease Other   . Heart disease Other   . Cancer Sister     breast  . Hypothyroidism Sister   . Asthma Sister   . Rheum arthritis Paternal Grandmother   . Rheum arthritis Daughter     History   Social History  . Marital Status: Married    Spouse Name: N/A  . Number of Children: 4  . Years of Education: N/A   Occupational History  . retired    Social History Main Topics  . Smoking status: Former Smoker -- 0.10 packs/day for 1 years    Types: Cigarettes    Quit date: 12/12/1962  . Smokeless tobacco: Never Used     Comment: only a few years  . Alcohol Use: Yes     Comment: rarely  . Drug Use: No  . Sexual Activity:    Partners: Male   Other Topics Concern  . None   Social History Narrative   College grad. Married '61. 4 daughters, 2 grandchildren. 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      Advanced Directives:  Has Living Will, HCPOA: Kaly Mcquary [phone]     Current outpatient prescriptions:  .  EPINEPHrine (EPIPEN) 0.3 mg/0.3 mL SOAJ injection, Inject 0.3 mg into the muscle once as needed (for allergic reaction)., Disp: , Rfl:  .  estradiol (ESTRACE) 1 MG tablet, Take 0.5 mg by mouth at bedtime. , Disp: , Rfl:  .  hydrochlorothiazide (MICROZIDE) 12.5 MG capsule, Take 1 capsule (12.5 mg total) by mouth daily., Disp: 90 capsule, Rfl: 2 .  Multiple Vitamin (MULTIVITAMIN) capsule, Take 1 capsule by mouth daily., Disp: , Rfl:  .  OVER THE COUNTER MEDICATION, Take 1 packet by mouth daily. Dissolve one packet in a glass of water each morning. Emergen-C, Disp: , Rfl:  .  pravastatin (PRAVACHOL) 40 MG tablet, Take 0.5 tablets  (20 mg total) by mouth daily., Disp: 90 tablet, Rfl: 2 .  progesterone (PROMETRIUM) 100 MG capsule, Take 100 mg by mouth at bedtime. , Disp: , Rfl:  .  vitamin B-12 (CYANOCOBALAMIN) 1000 MCG tablet, Take 1,000 mcg by mouth daily., Disp: , Rfl:   EXAM:  Filed Vitals:   05/21/15 1326  BP: 118/80  Pulse: 81  Temp: 97.7 F (36.5 C)    Body mass index is 22.75 kg/(m^2).  GENERAL: vitals reviewed and listed above, alert, oriented, appears well hydrated and in no acute distress  HEENT: atraumatic, conjunttiva clear, no obvious abnormalities on inspection of external nose and ears  NECK: no obvious masses on inspection  LUNGS: clear to auscultation bilaterally, no wheezes, rales or rhonchi, good air movement  CV: HRRR, no peripheral edema  MS: moves all extremities without noticeable abnormality  PSYCH: pleasant and cooperative, no obvious depression or anxiety  ASSESSMENT AND PLAN:  Discussed the following assessment and plan:  Hyperlipemia/leg cramps -she is very anxious about medications and does seem to be intolerant of many -discussed options and she wants to try alt day dosing and maybe supplements as dauhgter takes a lot of supplement and diet. Discussed CoEQ10, limited data and looked on consumer lab and seems 50mg  bid has been used in research, Norberto Sorenson was an approved brand. Also discussed yed yeast rice. However, did advise of my concerns with quality with supplementation and also that just because is "natural" is not void of side effects. Advised healthy diet with good fats and regular exercise.  -discussed other causes of leg pain, but she is pretty sure is the statin  Essential hypertension -stable  Generalized anxiety disorder -cont current tx, monitor  -Patient advised to return or notify a doctor immediately if symptoms worsen or persist or new concerns arise.  Patient Instructions  Can try every other day dosing of the pravastatin to see if this helps    Follow up in 3 months     Ranisha Allaire R.

## 2015-05-21 NOTE — Progress Notes (Signed)
Pre visit review using our clinic review tool, if applicable. No additional management support is needed unless otherwise documented below in the visit note. 

## 2015-05-21 NOTE — Patient Instructions (Signed)
Can try every other day dosing of the pravastatin to see if this helps   Follow up in 3 months

## 2015-07-23 DIAGNOSIS — D239 Other benign neoplasm of skin, unspecified: Secondary | ICD-10-CM | POA: Diagnosis not present

## 2015-07-23 DIAGNOSIS — L821 Other seborrheic keratosis: Secondary | ICD-10-CM | POA: Diagnosis not present

## 2015-09-04 ENCOUNTER — Ambulatory Visit (INDEPENDENT_AMBULATORY_CARE_PROVIDER_SITE_OTHER): Payer: Medicare Other | Admitting: Family Medicine

## 2015-09-04 ENCOUNTER — Encounter: Payer: Self-pay | Admitting: Family Medicine

## 2015-09-04 VITALS — BP 102/60 | HR 76 | Temp 97.6°F | Ht 63.0 in | Wt 129.0 lb

## 2015-09-04 DIAGNOSIS — I10 Essential (primary) hypertension: Secondary | ICD-10-CM

## 2015-09-04 DIAGNOSIS — N951 Menopausal and female climacteric states: Secondary | ICD-10-CM

## 2015-09-04 DIAGNOSIS — E785 Hyperlipidemia, unspecified: Secondary | ICD-10-CM

## 2015-09-04 DIAGNOSIS — Z23 Encounter for immunization: Secondary | ICD-10-CM

## 2015-09-04 DIAGNOSIS — R232 Flushing: Secondary | ICD-10-CM

## 2015-09-04 DIAGNOSIS — F411 Generalized anxiety disorder: Secondary | ICD-10-CM

## 2015-09-04 NOTE — Progress Notes (Signed)
Pre visit review using our clinic review tool, if applicable. No additional management support is needed unless otherwise documented below in the visit note. 

## 2015-09-04 NOTE — Progress Notes (Signed)
HPI:  HLD: -reports she did not even tolerate every other day dosing of pravastatin, felt caused leg cramps -very concerned with side effects -denies falls, weakness, numbness, bowel or bladder incontinence  HTN: History: she is leary of meds and it has been difficult to find anything she feels comfortable taking. She has not tolerated at various times: lisinopril, norvasc and combos. She has significant anxiety and has a habit of checking her BP excessively when stressed or having panic attack and then going to the ER. -meds: hctz 12.5 -reports: doing well, occ palpitations -denies: CP, SOB, swelling  GAD/Panic Disorder: History:she worries excessively about everything and in particular her BP and initially was checking her BP repetitively - has ended up in the ER on several occassions with panic. I have advise cbt and pharmacotherapy - she is sensitive to medications and is very reluctant to take anything. Her husband is sick and she struggles with the change of caring for him. -was seeing susan bond and is doing better per her past report  Hot flashes: -sees gyn, on estradiol and prometrium  ROS: See pertinent positives and negatives per HPI.  Past Medical History  Diagnosis Date  . Hemorrhoids, external   . Personal history of diseases of skin and subcutaneous tissue   . Other and unspecified hyperlipidemia   . Personal history of unspecified disease   . Need for prophylactic hormone replacement therapy (postmenopausal)   . Allergic rhinitis, cause unspecified   . Irritable bowel syndrome   . Contact dermatitis 03/16/2013  . Hypertension   . HEMORRHOIDS, EXTERNAL 02/07/2008    Qualifier: History of  By: [place], [place]    . Palpitations 02/19/2013    On-set of symptoms March '14. EKG with NSR     Past Surgical History  Procedure Laterality Date  . Benign moles removed    . Sebaceous cyst excised    . Hemorrhoid surgery    . Cataract extraction w/ intraocular lens  implant Bilateral     right - Nov '11, left - Nov '13 [place]    Family History  Problem Relation Age of Onset  . Heart disease Father   . Cancer Sister     breast  . Heart disease Other   . Heart disease Other   . Heart disease Other   . Cancer Sister     breast  . Hypothyroidism Sister   . Asthma Sister   . Rheum arthritis Paternal Grandmother   . Rheum arthritis Daughter     Social History   Social History  . Marital Status: Married    Spouse Name: N/A  . Number of Children: 4  . Years of Education: N/A   Occupational History  . retired    Social History Main Topics  . Smoking status: Former Smoker -- 0.10 packs/day for 1 years    Types: Cigarettes    Quit date: 12/12/1962  . Smokeless tobacco: Never Used     Comment: only a few years  . Alcohol Use: Yes     Comment: rarely  . Drug Use: No  . Sexual Activity:    Partners: Male   Other Topics Concern  . None   Social History Narrative   College grad. Married '61. 4 daughters, 2 grandchildren. 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      Advanced Directives: Has Living Will, HCPOA: Safiya Girdler [phone]     Current outpatient prescriptions:  .  EPINEPHrine (EPIPEN) 0.3 mg/0.3 mL SOAJ  injection, Inject 0.3 mg into the muscle once as needed (for allergic reaction)., Disp: , Rfl:  .  estradiol (ESTRACE) 1 MG tablet, Take 0.5 mg by mouth at bedtime. , Disp: , Rfl:  .  hydrochlorothiazide (MICROZIDE) 12.5 MG capsule, Take 1 capsule (12.5 mg total) by mouth daily., Disp: 90 capsule, Rfl: 2 .  Multiple Vitamin (MULTIVITAMIN) capsule, Take 1 capsule by mouth daily., Disp: , Rfl:  .  OVER THE COUNTER MEDICATION, Take 1 packet by mouth daily. Dissolve one packet in a glass of water each morning. Emergen-C, Disp: , Rfl:  .  progesterone (PROMETRIUM) 100 MG capsule, Take 100 mg by mouth at bedtime. , Disp: , Rfl:  .  vitamin B-12 (CYANOCOBALAMIN) 1000 MCG tablet, Take 1,000 mcg by mouth daily., Disp:  , Rfl:   EXAM:  Filed Vitals:   09/04/15 0844  BP: 102/60  Pulse: 76  Temp: 97.6 F (36.4 C)    Body mass index is 22.86 kg/(m^2).  GENERAL: vitals reviewed and listed above, alert, oriented, appears well hydrated and in no acute distress  HEENT: atraumatic, conjunttiva clear, no obvious abnormalities on inspection of external nose and ears  NECK: no obvious masses on inspection  LUNGS: clear to auscultation bilaterally, no wheezes, rales or rhonchi, good air movement  CV: HRRR, no peripheral edema  MS: moves all extremities without noticeable abnormality  PSYCH: pleasant and cooperative, no obvious depression or anxiety  ASSESSMENT AND PLAN:  Discussed the following assessment and plan:  Hyperlipemia - Plan: Lipid Panel  Essential hypertension  Generalized anxiety disorder  Hot flashes - Managed by Dr. Philis Pique - gets yearly female gyn exams  -Patient advised to return or notify a doctor immediately if symptoms worsen or persist or new concerns arise.  Patient Instructions  BEFORE YOU LEAVE: -flu shot -schedule fasting lab appointment sometime in the next 2 weeks to check your cholesterol -follow up in 4 months for Medicare Wellness Visit  We recommend the following healthy lifestyle measures: - eat a healthy diet consisting of lots of vegetables, fruits, beans, nuts, seeds, healthy meats such as white chicken and fish and whole grains.  - avoid fried foods, fast food, processed foods, sodas, red meet and other fattening foods.  - get a least 150 minutes of aerobic exercise per week.   If cholesterol too high we may consider fish oil or 1-2 times per week crestor      Colin Benton R.

## 2015-09-04 NOTE — Patient Instructions (Addendum)
BEFORE YOU LEAVE: -flu shot -schedule fasting lab appointment sometime in the next 2 weeks to check your cholesterol -follow up in 4 months for Medicare Wellness Visit  We recommend the following healthy lifestyle measures: - eat a healthy diet consisting of lots of vegetables, fruits, beans, nuts, seeds, healthy meats such as white chicken and fish and whole grains.  - avoid fried foods, fast food, processed foods, sodas, red meet and other fattening foods.  - get a least 150 minutes of aerobic exercise per week.   If cholesterol too high we may consider fish oil or 1-2 times per week crestor

## 2015-09-06 ENCOUNTER — Other Ambulatory Visit: Payer: Self-pay | Admitting: Family Medicine

## 2015-09-11 ENCOUNTER — Other Ambulatory Visit (INDEPENDENT_AMBULATORY_CARE_PROVIDER_SITE_OTHER): Payer: Medicare Other

## 2015-09-11 DIAGNOSIS — E785 Hyperlipidemia, unspecified: Secondary | ICD-10-CM

## 2015-09-11 LAB — LIPID PANEL
CHOLESTEROL: 195 mg/dL (ref 0–200)
HDL: 60.8 mg/dL (ref >39.00)
LDL CALC: 113 mg/dL — AB (ref 0–99)
NONHDL: 134.14
Total CHOL/HDL Ratio: 3
Triglycerides: 106 mg/dL (ref 0.0–149.0)
VLDL: 21.2 mg/dL (ref 0.0–40.0)

## 2015-10-05 ENCOUNTER — Encounter: Payer: Self-pay | Admitting: Family Medicine

## 2015-10-05 ENCOUNTER — Other Ambulatory Visit: Payer: Self-pay | Admitting: Family Medicine

## 2015-10-05 ENCOUNTER — Ambulatory Visit (INDEPENDENT_AMBULATORY_CARE_PROVIDER_SITE_OTHER): Payer: Medicare Other | Admitting: Family Medicine

## 2015-10-05 VITALS — BP 112/60 | HR 78 | Temp 97.3°F | Ht 63.0 in | Wt 127.3 lb

## 2015-10-05 DIAGNOSIS — H6983 Other specified disorders of Eustachian tube, bilateral: Secondary | ICD-10-CM | POA: Diagnosis not present

## 2015-10-05 DIAGNOSIS — F411 Generalized anxiety disorder: Secondary | ICD-10-CM

## 2015-10-05 DIAGNOSIS — J309 Allergic rhinitis, unspecified: Secondary | ICD-10-CM

## 2015-10-05 DIAGNOSIS — H6993 Unspecified Eustachian tube disorder, bilateral: Secondary | ICD-10-CM

## 2015-10-05 MED ORDER — FLUTICASONE PROPIONATE 50 MCG/ACT NA SUSP: 2.0000 | Freq: Every day | NASAL | Status: DC

## 2015-10-05 NOTE — Progress Notes (Signed)
HPI:  [name] is a pleasant 35 you female pt with a PMH sig GAD, HTN and allergic rhinitis here for an acute visit for:  Sinus congestion: -started: last week -symptoms:nasal congestion, lightheaded for a few secs a few times, PND, ears and sinuses feel full -denies:fever, SOB, NVD, tooth pain, HA -has tried: claritin -sick contacts/travel/risks: denies flu exposure, tick exposure or or Ebola risks -hx allergic rhinitis   GAD: -recent death in the family and will be traveling up [place] -requests refill on ativan for panic -has been an little more anxious with recent events -No depression  ROS: See pertinent positives and negatives per HPI.  Past Medical History  Diagnosis Date  . Hemorrhoids, external   . Personal history of diseases of skin and subcutaneous tissue   . Other and unspecified hyperlipidemia   . Personal history of unspecified disease   . Need for prophylactic hormone replacement therapy (postmenopausal)   . Allergic rhinitis, cause unspecified   . Irritable bowel syndrome   . Contact dermatitis 03/16/2013  . Hypertension   . HEMORRHOIDS, EXTERNAL 02/07/2008    Qualifier: History of  By: [place], [place]    . Palpitations 02/19/2013    On-set of symptoms March '14. EKG with NSR     Past Surgical History  Procedure Laterality Date  . Benign moles removed    . Sebaceous cyst excised    . Hemorrhoid surgery    . Cataract extraction w/ intraocular lens implant Bilateral     right - Nov '11, left - Nov '13 [place]    Family History  Problem Relation Age of Onset  . Heart disease Father   . Cancer Sister     breast  . Heart disease Other   . Heart disease Other   . Heart disease Other   . Cancer Sister     breast  . Hypothyroidism Sister   . Asthma Sister   . Rheum arthritis Paternal Grandmother   . Rheum arthritis Daughter     Social History   Social History  . Marital Status: Married    Spouse Name: N/A  . Number of  Children: 4  . Years of Education: N/A   Occupational History  . retired    Social History Main Topics  . Smoking status: Former Smoker -- 0.10 packs/day for 1 years    Types: Cigarettes    Quit date: 12/12/1962  . Smokeless tobacco: Never Used     Comment: only a few years  . Alcohol Use: Yes     Comment: rarely  . Drug Use: No  . Sexual Activity:    Partners: Male   Other Topics Concern  . None   Social History Narrative   College grad. Married '61. 4 daughters, 2 grandchildren. 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      Advanced Directives: Has Living Will, HCPOA: Mabry Santarelli [phone]     Current outpatient prescriptions:  .  EPINEPHrine (EPIPEN) 0.3 mg/0.3 mL SOAJ injection, Inject 0.3 mg into the muscle once as needed (for allergic reaction)., Disp: , Rfl:  .  estradiol (ESTRACE) 1 MG tablet, Take 0.5 mg by mouth at bedtime. , Disp: , Rfl:  .  hydrochlorothiazide (MICROZIDE) 12.5 MG capsule, TAKE 1 CAPSULE (12.5 MG TOTAL) BY MOUTH DAILY., Disp: 90 capsule, Rfl: 1 .  LORazepam (ATIVAN) 0.5 MG tablet, TAKE 1 TABLET BY MOUTH EVERY 8 HOURS, Disp: 10 tablet, Rfl: 0 .  Multiple Vitamin (MULTIVITAMIN)  capsule, Take 1 capsule by mouth daily., Disp: , Rfl:  .  OVER THE COUNTER MEDICATION, Take 1 packet by mouth daily. Dissolve one packet in a glass of water each morning. Emergen-C, Disp: , Rfl:  .  progesterone (PROMETRIUM) 100 MG capsule, Take 100 mg by mouth at bedtime. , Disp: , Rfl:  .  vitamin B-12 (CYANOCOBALAMIN) 1000 MCG tablet, Take 1,000 mcg by mouth daily., Disp: , Rfl:   EXAM:  Filed Vitals:   10/05/15 1526  BP: 112/60  Pulse: 78  Temp: 97.3 F (36.3 C)    Body mass index is 22.56 kg/(m^2).  GENERAL: vitals reviewed and listed above, alert, oriented, appears well hydrated and in no acute distress  HEENT: atraumatic, conjunttiva clear, no obvious abnormalities on inspection of external nose and ears, normal appearance of ear canals and TMs except  for clear effusion bilat, clear nasal congestion, mild post oropharyngeal erythema with PND, no tonsillar edema or exudate, no sinus TTP  NECK: no obvious masses on inspection  LUNGS: clear to auscultation bilaterally, no wheezes, rales or rhonchi, good air movement  CV: HRRR, no peripheral edema  MS: moves all extremities without noticeable abnormality  PSYCH: pleasant and cooperative, no obvious depression or anxiety  ASSESSMENT AND PLAN:  Discussed the following assessment and plan:  Allergic rhinitis, unspecified allergic rhinitis type  Eustachian tube dysfunction, bilateral  Generalized anxiety disorder  -given HPI and exam findings today, a serious infection or illness is unlikely. We discussed potential etiologies, with allergic rhinitis or VURI being most likely, and advised nasal decongestant, anithistaimine and INS. We discussed treatment side effects, likely course, antibiotic misuse, transmission, and signs of developing a serious illness. Refill for ativan sent earlier today. Supported. -of course, we advised to return or notify a doctor immediately if symptoms worsen or persist or new concerns arise.    There are no Patient Instructions on file for this visit.   Colin Benton R.

## 2015-10-05 NOTE — Progress Notes (Signed)
Pre visit review using our clinic review tool, if applicable. No additional management support is needed unless otherwise documented below in the visit note. 

## 2015-10-05 NOTE — Patient Instructions (Signed)
-  AFRIN nasal spray 2 times daily for 3 days, then STOP  -flonase 2 sprays each nostril daily for 1 month  -follow up if symptoms persist, worsening or new concerns

## 2015-10-05 NOTE — Telephone Encounter (Signed)
The Rx was printed and I called this in to the pts pharmacy.  Also informed pt at her office visit today.

## 2015-10-13 ENCOUNTER — Ambulatory Visit (INDEPENDENT_AMBULATORY_CARE_PROVIDER_SITE_OTHER): Payer: Medicare Other | Admitting: Licensed Clinical Social Worker

## 2015-10-13 DIAGNOSIS — F411 Generalized anxiety disorder: Secondary | ICD-10-CM | POA: Diagnosis not present

## 2015-10-20 ENCOUNTER — Ambulatory Visit (INDEPENDENT_AMBULATORY_CARE_PROVIDER_SITE_OTHER): Payer: Medicare Other | Admitting: Family Medicine

## 2015-10-20 ENCOUNTER — Encounter: Payer: Self-pay | Admitting: Family Medicine

## 2015-10-20 VITALS — BP 128/60 | HR 77 | Temp 97.4°F | Ht 63.0 in | Wt 126.9 lb

## 2015-10-20 DIAGNOSIS — J310 Chronic rhinitis: Secondary | ICD-10-CM

## 2015-10-20 DIAGNOSIS — I10 Essential (primary) hypertension: Secondary | ICD-10-CM

## 2015-10-20 DIAGNOSIS — J309 Allergic rhinitis, unspecified: Secondary | ICD-10-CM

## 2015-10-20 DIAGNOSIS — J329 Chronic sinusitis, unspecified: Secondary | ICD-10-CM

## 2015-10-20 MED ORDER — PREDNISONE 20 MG PO TABS: 40.0000 mg | ORAL_TABLET | Freq: Every day | ORAL | Status: DC

## 2015-10-20 MED ORDER — DOXYCYCLINE HYCLATE 100 MG PO TABS: 100.0000 mg | ORAL_TABLET | Freq: Two times a day (BID) | ORAL | Status: DC

## 2015-10-20 NOTE — Progress Notes (Signed)
Pre visit review using our clinic review tool, if applicable. No additional management support is needed unless otherwise documented below in the visit note. 

## 2015-10-20 NOTE — Patient Instructions (Signed)
3 days of Afrin then stop  If worsening or if not better in one week take the antibiotic and steroid course  Follow up if worsening, new concerns or persistent symptoms  Do not use your blood pressure cuff - it is reading abnormally high

## 2015-10-20 NOTE — Progress Notes (Signed)
HPI:  Acute visit for:  Rhinsinusitis: -started 3 weeks ago, mildly better -symptoms: nasal congestion, pnd, laryngitis, cough, fullness and popping in ears, mild lightheaded sensation at times, sneezing -denies: fevers, chills, malaise, facial or tooth pain, ear pain, SOB, DOE, hemoptysis, NVD -aftrin helped - used for 3 days a few weeks ago, flonase helps some  "Elevated Blood Pressure": -took her BP at home and it was up - wanted to check her cuff so brought this today and my assistant confirmed it is reading much higher then our office cuff -BP on our check today was fine History: she is leary of meds and it has been difficult to find anything she feels comfortable taking. She has not tolerated at various times: lisinopril, norvasc and combos. She has significant anxiety and has a habit of checking her BP excessively when stressed  -meds: hctz 12.5 -reports: doing well, occ palpitations -denies: CP, SOB, swelling  ROS: See pertinent positives and negatives per HPI.  Past Medical History  Diagnosis Date  . Hemorrhoids, external   . Personal history of diseases of skin and subcutaneous tissue   . Other and unspecified hyperlipidemia   . Personal history of unspecified disease   . Need for prophylactic hormone replacement therapy (postmenopausal)   . Allergic rhinitis, cause unspecified   . Irritable bowel syndrome   . Contact dermatitis 03/16/2013  . Hypertension   . HEMORRHOIDS, EXTERNAL 02/07/2008    Qualifier: History of  By: [place], [place]    . Palpitations 02/19/2013    On-set of symptoms March '14. EKG with NSR     Past Surgical History  Procedure Laterality Date  . Benign moles removed    . Sebaceous cyst excised    . Hemorrhoid surgery    . Cataract extraction w/ intraocular lens implant Bilateral     right - Nov '11, left - Nov '13 [place]    Family History  Problem Relation Age of Onset  . Heart disease Father   . Cancer Sister     breast  .  Heart disease Other   . Heart disease Other   . Heart disease Other   . Cancer Sister     breast  . Hypothyroidism Sister   . Asthma Sister   . Rheum arthritis Paternal Grandmother   . Rheum arthritis Daughter     Social History   Social History  . Marital Status: Married    Spouse Name: N/A  . Number of Children: 4  . Years of Education: N/A   Occupational History  . retired    Social History Main Topics  . Smoking status: Former Smoker -- 0.10 packs/day for 1 years    Types: Cigarettes    Quit date: 12/12/1962  . Smokeless tobacco: Never Used     Comment: only a few years  . Alcohol Use: Yes     Comment: rarely  . Drug Use: No  . Sexual Activity:    Partners: Male   Other Topics Concern  . None   Social History Narrative   College grad. Married '61. 4 daughters, 2 grandchildren. 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      Advanced Directives: Has Living Will, HCPOA: Adrijana Haros [phone]     Current outpatient prescriptions:  .  EPINEPHrine (EPIPEN) 0.3 mg/0.3 mL SOAJ injection, Inject 0.3 mg into the muscle once as needed (for allergic reaction)., Disp: , Rfl:  .  estradiol (ESTRACE) 1 MG tablet, Take 0.5 mg by  mouth at bedtime. , Disp: , Rfl:  .  fluticasone (FLONASE) 50 MCG/ACT nasal spray, Place 2 sprays into both nostrils daily., Disp: 16 g, Rfl: 6 .  hydrochlorothiazide (MICROZIDE) 12.5 MG capsule, TAKE 1 CAPSULE (12.5 MG TOTAL) BY MOUTH DAILY., Disp: 90 capsule, Rfl: 1 .  LORazepam (ATIVAN) 0.5 MG tablet, TAKE 1 TABLET BY MOUTH EVERY 8 HOURS, Disp: 10 tablet, Rfl: 0 .  Multiple Vitamin (MULTIVITAMIN) capsule, Take 1 capsule by mouth daily., Disp: , Rfl:  .  OVER THE COUNTER MEDICATION, Take 1 packet by mouth daily. Dissolve one packet in a glass of water each morning. Emergen-C, Disp: , Rfl:  .  progesterone (PROMETRIUM) 100 MG capsule, Take 100 mg by mouth at bedtime. , Disp: , Rfl:  .  vitamin B-12 (CYANOCOBALAMIN) 1000 MCG tablet, Take 1,000  mcg by mouth daily., Disp: , Rfl:  .  doxycycline (VIBRA-TABS) 100 MG tablet, Take 1 tablet (100 mg total) by mouth 2 (two) times daily., Disp: 14 tablet, Rfl: 0 .  predniSONE (DELTASONE) 20 MG tablet, Take 2 tablets (40 mg total) by mouth daily with breakfast., Disp: 8 tablet, Rfl: 0  EXAM:  Filed Vitals:   10/20/15 1341  BP: 128/60  Pulse: 77  Temp: 97.4 F (36.3 C)    Body mass index is 22.48 kg/(m^2).  GENERAL: vitals reviewed and listed above, alert, oriented, appears well hydrated and in no acute distress  HEENT: atraumatic, conjunttiva clear, no obvious abnormalities on inspection of external nose and ears, normal appearance of ear canals and TMs except for clear effusion R, clear nasal congestion, mild post oropharyngeal erythema with PND, no tonsillar edema or exudate, no sinus TTP  NECK: no obvious masses on inspection  LUNGS: clear to auscultation bilaterally, no wheezes, rales or rhonchi, good air movement  CV: HRRR, no peripheral edema  MS: moves all extremities without noticeable abnormality  PSYCH: pleasant and cooperative, no obvious depression or anxiety  ASSESSMENT AND PLAN:  Discussed the following assessment and plan:  Rhinosinusitis -hx of allergic rhinitis, discussed potential etiologies with allergic or prolonged post viral symptoms most likely and possible bacterial sinusitis though no alarming symptoms -opted for 3 more days of afrin, cont antihistamine and prednisone and abx if worsening or not resolving over the next 5-7 days. Follow up if persists or worsens.  Essential hypertension -BP looks good today, her cuff is off, advised not to monitor at home or to obtain a new cuff  -Patient advised to return or notify a doctor immediately if symptoms worsen or persist or new concerns arise.  Patient Instructions  3 days of Afrin then stop  If worsening or if not better in one week take the antibiotic and steroid course  Follow up if worsening, new  concerns or persistent symptoms  Do not use your blood pressure cuff - it is reading abnormally high     Maddon Horton R.

## 2015-11-02 ENCOUNTER — Telehealth: Payer: Self-pay | Admitting: Family Medicine

## 2015-11-02 NOTE — Telephone Encounter (Signed)
Also, was only to use the afrin for 4 days then stop. Please see if she needs appt or wants to try the prescriptions sent last visit?

## 2015-11-02 NOTE — Telephone Encounter (Signed)
I called the pt and informed her of the message below and she stated apologized as she did not know the prescriptions were attached and put these in her file cabinet.  States she will try the medications first.

## 2015-11-02 NOTE — Telephone Encounter (Signed)
Patient Name: ESSYNCE OZIER DOB: [birth year] Initial Comment Caller states she is medication is not working; it has been a month; still hoarse and fluids in the ears and head Nurse Assessment Nurse: Ronnald Ramp, RN, Miranda Date/Time (Eastern Time): 11/02/2015 9:46:42 AM Confirm and document reason for call. If symptomatic, describe symptoms. ---Caller states she has been having hoarseness and congestion for 1 month. She has been using Afrin and Flonase with no improvement. Has the patient traveled out of the country within the last 30 days? ---Not Applicable Does the patient have any new or worsening symptoms? ---Yes Will a triage be completed? ---Yes Related visit to physician within the last 2 weeks? ---Yes Does the PT have any chronic conditions? (i.e. diabetes, asthma, etc.) ---Yes List chronic conditions. ---Allergies Is this a behavioral health call? ---No Guidelines Guideline Title Affirmed Question Affirmed Notes Nasal Allergies (Hay Fever) [1] Nasal allergies AND [id number] only certain times of year (hay fever) (all triage questions negative) Final Disposition User Olmito, RN, Miranda Comments Reviewed pt's MD note from 11/8 visit and there is a note that states if symptoms not improved in 1 week to take prescribed Steroid and antibiotics. Caller states she did not see that on the plan of care. She will call the pharmacy to see if they still have the medication where she can pick them up. If not, she will call back. After caller found out there were meds to check on, she did not stay on the phone to complete the care advice. Disagree/Comply: Comply

## 2015-11-03 ENCOUNTER — Ambulatory Visit (INDEPENDENT_AMBULATORY_CARE_PROVIDER_SITE_OTHER): Payer: Medicare Other | Admitting: Licensed Clinical Social Worker

## 2015-11-03 DIAGNOSIS — F411 Generalized anxiety disorder: Secondary | ICD-10-CM | POA: Diagnosis not present

## 2015-11-11 DIAGNOSIS — L821 Other seborrheic keratosis: Secondary | ICD-10-CM | POA: Diagnosis not present

## 2015-11-11 DIAGNOSIS — D239 Other benign neoplasm of skin, unspecified: Secondary | ICD-10-CM | POA: Diagnosis not present

## 2015-11-13 ENCOUNTER — Ambulatory Visit (INDEPENDENT_AMBULATORY_CARE_PROVIDER_SITE_OTHER): Payer: Medicare Other | Admitting: Family Medicine

## 2015-11-13 ENCOUNTER — Encounter: Payer: Self-pay | Admitting: Family Medicine

## 2015-11-13 VITALS — BP 128/70 | HR 87 | Temp 97.7°F | Ht 63.0 in | Wt 124.1 lb

## 2015-11-13 DIAGNOSIS — R42 Dizziness and giddiness: Secondary | ICD-10-CM

## 2015-11-13 DIAGNOSIS — R0982 Postnasal drip: Secondary | ICD-10-CM

## 2015-11-13 DIAGNOSIS — J309 Allergic rhinitis, unspecified: Secondary | ICD-10-CM | POA: Diagnosis not present

## 2015-11-13 NOTE — Progress Notes (Signed)
HPI:  Acute visit for:  Laryngitis: -reports ongoing intermittently for 2 months -symptoms: PND, sneezing, nasal congestion, fullness in ears, occ brief vertigo, hoarseness at times -denies: fevers, chills, malaise, falls, sinus pain, acid reflux, vomiting, nausea, SOB, DOE, thick mucus -has tried flonase - which helped then she stopped it two weeks ago, antibiotics, prednisone - which helped while taking, afrin short course, antihistamine -she is very concerned with there persistance  ROS: See pertinent positives and negatives per HPI.  Past Medical History  Diagnosis Date  . Hemorrhoids, external   . Personal history of diseases of skin and subcutaneous tissue   . Other and unspecified hyperlipidemia   . Personal history of unspecified disease   . Need for prophylactic hormone replacement therapy (postmenopausal)   . Allergic rhinitis, cause unspecified   . Irritable bowel syndrome   . Contact dermatitis 03/16/2013  . Hypertension   . HEMORRHOIDS, EXTERNAL 02/07/2008    Qualifier: History of  By: [place], [place]    . Palpitations 02/19/2013    On-set of symptoms March '14. EKG with NSR     Past Surgical History  Procedure Laterality Date  . Benign moles removed    . Sebaceous cyst excised    . Hemorrhoid surgery    . Cataract extraction w/ intraocular lens implant Bilateral     right - Nov '11, left - Nov '13 [place]    Family History  Problem Relation Age of Onset  . Heart disease Father   . Cancer Sister     breast  . Heart disease Other   . Heart disease Other   . Heart disease Other   . Cancer Sister     breast  . Hypothyroidism Sister   . Asthma Sister   . Rheum arthritis Paternal Grandmother   . Rheum arthritis Daughter     Social History   Social History  . Marital Status: Married    Spouse Name: N/A  . Number of Children: 4  . Years of Education: N/A   Occupational History  . retired    Social History Main Topics  . Smoking  status: Former Smoker -- 0.10 packs/day for 1 years    Types: Cigarettes    Quit date: 12/12/1962  . Smokeless tobacco: Never Used     Comment: only a few years  . Alcohol Use: Yes     Comment: rarely  . Drug Use: No  . Sexual Activity:    Partners: Male   Other Topics Concern  . None   Social History Narrative   College grad. Married '61. 4 daughters, 2 grandchildren. 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      Advanced Directives: Has Living Will, HCPOA: Leilana Rotert [phone]     Current outpatient prescriptions:  .  EPINEPHrine (EPIPEN) 0.3 mg/0.3 mL SOAJ injection, Inject 0.3 mg into the muscle once as needed (for allergic reaction)., Disp: , Rfl:  .  estradiol (ESTRACE) 1 MG tablet, Take 0.5 mg by mouth at bedtime. , Disp: , Rfl:  .  fluticasone (FLONASE) 50 MCG/ACT nasal spray, Place 2 sprays into both nostrils daily., Disp: 16 g, Rfl: 6 .  hydrochlorothiazide (MICROZIDE) 12.5 MG capsule, TAKE 1 CAPSULE (12.5 MG TOTAL) BY MOUTH DAILY., Disp: 90 capsule, Rfl: 1 .  LORazepam (ATIVAN) 0.5 MG tablet, TAKE 1 TABLET BY MOUTH EVERY 8 HOURS, Disp: 10 tablet, Rfl: 0 .  Multiple Vitamin (MULTIVITAMIN) capsule, Take 1 capsule by mouth daily., Disp: , Rfl:  .  OVER THE COUNTER MEDICATION, Take 1 packet by mouth daily. Dissolve one packet in a glass of water each morning. Emergen-C, Disp: , Rfl:  .  progesterone (PROMETRIUM) 100 MG capsule, Take 100 mg by mouth at bedtime. , Disp: , Rfl:  .  vitamin B-12 (CYANOCOBALAMIN) 1000 MCG tablet, Take 1,000 mcg by mouth daily., Disp: , Rfl:   EXAM:  Filed Vitals:   11/13/15 1621  BP: 128/70  Pulse: 87  Temp: 97.7 F (36.5 C)    Body mass index is 21.99 kg/(m^2).  GENERAL: vitals reviewed and listed above, alert, oriented, appears well hydrated and in no acute distress  HEENT: atraumatic, conjunttiva clear, PERRLA, visual acuity and hearing grossly intact, no obvious abnormalities on inspection of external nose and ears, normal  appearance of ear canals and TMs, clear nasal congestion, mild post oropharyngeal erythema with PND, no tonsillar edema or exudate, no sinus TTP  NECK: no obvious masses on inspection  LUNGS: clear to auscultation bilaterally, no wheezes, rales or rhonchi, good air movement  CV: HRRR, no peripheral edema  MS: moves all extremities without noticeable abnormality  PSYCH: pleasant and cooperative, no obvious depression or anxiety  ASSESSMENT AND PLAN:  Discussed the following assessment and plan:  PND (post-nasal drip)  Allergic rhinitis, unspecified allergic rhinitis type  Vertigo  -many symptoms, ongoing, advised eval with ENT and she agrees to schedule -advised to get back on flonase in interim as this helps and allergic rhinitis likely contributing to most of her symptoms -she does not believe silent reflux is contributing -Patient advised to return or notify a doctor immediately if symptoms worsen or persist or new concerns arise.  There are no Patient Instructions on file for this visit.   Kathryn Benton R.

## 2015-11-13 NOTE — Progress Notes (Signed)
Pre visit review using our clinic review tool, if applicable. No additional management support is needed unless otherwise documented below in the visit note. 

## 2015-11-17 ENCOUNTER — Ambulatory Visit (INDEPENDENT_AMBULATORY_CARE_PROVIDER_SITE_OTHER): Payer: Medicare Other | Admitting: Licensed Clinical Social Worker

## 2015-11-17 DIAGNOSIS — F411 Generalized anxiety disorder: Secondary | ICD-10-CM | POA: Diagnosis not present

## 2015-11-23 ENCOUNTER — Other Ambulatory Visit: Payer: Self-pay | Admitting: Family Medicine

## 2015-11-24 ENCOUNTER — Ambulatory Visit: Payer: Medicare Other | Admitting: Licensed Clinical Social Worker

## 2015-11-25 ENCOUNTER — Ambulatory Visit (INDEPENDENT_AMBULATORY_CARE_PROVIDER_SITE_OTHER): Payer: Medicare Other | Admitting: Licensed Clinical Social Worker

## 2015-11-25 DIAGNOSIS — F411 Generalized anxiety disorder: Secondary | ICD-10-CM | POA: Diagnosis not present

## 2015-11-26 DIAGNOSIS — R490 Dysphonia: Secondary | ICD-10-CM | POA: Diagnosis not present

## 2015-11-30 ENCOUNTER — Telehealth: Payer: Self-pay | Admitting: Family Medicine

## 2015-11-30 NOTE — Telephone Encounter (Signed)
Error/njr °

## 2015-12-01 ENCOUNTER — Ambulatory Visit (INDEPENDENT_AMBULATORY_CARE_PROVIDER_SITE_OTHER): Payer: Medicare Other | Admitting: Family Medicine

## 2015-12-01 ENCOUNTER — Encounter: Payer: Self-pay | Admitting: Family Medicine

## 2015-12-01 VITALS — BP 116/70 | HR 82 | Temp 97.2°F | Ht 63.0 in | Wt 121.4 lb

## 2015-12-01 DIAGNOSIS — F411 Generalized anxiety disorder: Secondary | ICD-10-CM

## 2015-12-01 DIAGNOSIS — E785 Hyperlipidemia, unspecified: Secondary | ICD-10-CM

## 2015-12-01 DIAGNOSIS — N951 Menopausal and female climacteric states: Secondary | ICD-10-CM

## 2015-12-01 DIAGNOSIS — R232 Flushing: Secondary | ICD-10-CM

## 2015-12-01 DIAGNOSIS — I10 Essential (primary) hypertension: Secondary | ICD-10-CM

## 2015-12-01 DIAGNOSIS — F410 Panic disorder [episodic paroxysmal anxiety] without agoraphobia: Secondary | ICD-10-CM | POA: Diagnosis not present

## 2015-12-01 NOTE — Progress Notes (Signed)
Pre visit review using our clinic review tool, if applicable. No additional management support is needed unless otherwise documented below in the visit note. 

## 2015-12-01 NOTE — Progress Notes (Signed)
HPI:  Follow up:  GAD/Panic Disorder: History:she worries excessively about everything and in particular her BP and initially was checking her BP repetitively - has ended up in the ER on several occassions with panic. I have advise cbt and pharmacotherapy - she is sensitive to medications and is very reluctant to take anything. Her husband is sick and she struggles with the change of caring for him. -was seeing susan bond and is doing better -she uses a very small dose of ativan rarely for panic attacks - but has noticed this makes her feel funny, fall asleep and then not remember events - so has opted to stop taking  HLD: -reports she did not even tolerate every other day dosing of pravastatin, felt caused leg cramps -very concerned with side effects -denies falls, weakness, numbness, bowel or bladder incontinence  HTN: History: she is leary of meds and it has been difficult to find anything she feels comfortable taking. She has not tolerated at various times: lisinopril, norvasc and combos. She has significant anxiety and has a habit of checking her BP excessively when stressed or having panic attack and then going to the ER. -meds: hctz 12.5 -reports: doing well, occ palpitations -denies: CP, SOB, swelling  Hot flashes: -sees gyn, on estradiol and prometrium   ROS: See pertinent positives and negatives per HPI.  Past Medical History  Diagnosis Date  . Hemorrhoids, external   . Personal history of diseases of skin and subcutaneous tissue   . Other and unspecified hyperlipidemia   . Personal history of unspecified disease   . Need for prophylactic hormone replacement therapy (postmenopausal)   . Allergic rhinitis, cause unspecified   . Irritable bowel syndrome   . Contact dermatitis 03/16/2013  . Hypertension   . HEMORRHOIDS, EXTERNAL 02/07/2008    Qualifier: History of  By: [place], [place]    . Palpitations 02/19/2013    On-set of symptoms March '14. EKG with NSR      Past Surgical History  Procedure Laterality Date  . Benign moles removed    . Sebaceous cyst excised    . Hemorrhoid surgery    . Cataract extraction w/ intraocular lens implant Bilateral     right - Nov '11, left - Nov '13 [place]    Family History  Problem Relation Age of Onset  . Heart disease Father   . Cancer Sister     breast  . Heart disease Other   . Heart disease Other   . Heart disease Other   . Cancer Sister     breast  . Hypothyroidism Sister   . Asthma Sister   . Rheum arthritis Paternal Grandmother   . Rheum arthritis Daughter     Social History   Social History  . Marital Status: Married    Spouse Name: N/A  . Number of Children: 4  . Years of Education: N/A   Occupational History  . retired    Social History Main Topics  . Smoking status: Former Smoker -- 0.10 packs/day for 1 years    Types: Cigarettes    Quit date: 12/12/1962  . Smokeless tobacco: Never Used     Comment: only a few years  . Alcohol Use: Yes     Comment: rarely  . Drug Use: No  . Sexual Activity:    Partners: Male   Other Topics Concern  . None   Social History Narrative   College grad. Married '61. 4 daughters, 2 grandchildren. Work - Surveyor, quantity  manager mfg. - retired.      Advanced Directives: Has Living Will, HCPOA: Debralee Winiarski [phone]     Current outpatient prescriptions:  .  EPINEPHrine (EPIPEN) 0.3 mg/0.3 mL SOAJ injection, Inject 0.3 mg into the muscle once as needed (for allergic reaction)., Disp: , Rfl:  .  estradiol (ESTRACE) 1 MG tablet, Take 0.5 mg by mouth at bedtime. , Disp: , Rfl:  .  fluticasone (FLONASE) 50 MCG/ACT nasal spray, Place 2 sprays into both nostrils daily., Disp: 16 g, Rfl: 6 .  hydrochlorothiazide (MICROZIDE) 12.5 MG capsule, TAKE 1 CAPSULE (12.5 MG TOTAL) BY MOUTH DAILY., Disp: 90 capsule, Rfl: 1 .  Multiple Vitamin (MULTIVITAMIN) capsule, Take 1 capsule by mouth daily., Disp: , Rfl:  .  OVER THE COUNTER MEDICATION,  Take 1 packet by mouth daily. Dissolve one packet in a glass of water each morning. Emergen-C, Disp: , Rfl:  .  progesterone (PROMETRIUM) 100 MG capsule, Take 100 mg by mouth at bedtime. , Disp: , Rfl:  .  vitamin B-12 (CYANOCOBALAMIN) 1000 MCG tablet, Take 1,000 mcg by mouth daily., Disp: , Rfl:   EXAM:  Filed Vitals:   12/01/15 0904  BP: 116/70  Pulse: 82  Temp: 97.2 F (36.2 C)    Body mass index is 21.51 kg/(m^2).  GENERAL: vitals reviewed and listed above, alert, oriented, appears well hydrated and in no acute distress  HEENT: atraumatic, conjunttiva clear, no obvious abnormalities on inspection of external nose and ears  NECK: no obvious masses on inspection  LUNGS: clear to auscultation bilaterally, no wheezes, rales or rhonchi, good air movement  CV: HRRR, no peripheral edema  MS: moves all extremities without noticeable abnormality  PSYCH: pleasant and cooperative, no obvious depression or anxiety  ASSESSMENT AND PLAN:  Discussed the following assessment and plan:  GAD (generalized anxiety disorder)  Panic disorder  Essential hypertension  Hyperlipemia  Hot flashes - Managed by Dr. Philis Pique - gets yearly female gyn exams  -discussed other tx options for GAD/Panic disorder, she prefers to try CBT only for the time being and follow up in 3 months or sooner as needed -BP great today -other issues stable -SSRI or effexor may help GAD and hotflashes if she opts for this in the future -Patient advised to return or notify a doctor immediately if symptoms worsen or persist or new concerns arise.  There are no Patient Instructions on file for this visit.   Colin Benton R.

## 2015-12-02 ENCOUNTER — Ambulatory Visit (INDEPENDENT_AMBULATORY_CARE_PROVIDER_SITE_OTHER): Payer: Medicare Other | Admitting: Licensed Clinical Social Worker

## 2015-12-02 DIAGNOSIS — F411 Generalized anxiety disorder: Secondary | ICD-10-CM | POA: Diagnosis not present

## 2015-12-03 DIAGNOSIS — Z1231 Encounter for screening mammogram for malignant neoplasm of breast: Secondary | ICD-10-CM | POA: Diagnosis not present

## 2015-12-03 DIAGNOSIS — Z124 Encounter for screening for malignant neoplasm of cervix: Secondary | ICD-10-CM | POA: Diagnosis not present

## 2015-12-03 DIAGNOSIS — N951 Menopausal and female climacteric states: Secondary | ICD-10-CM | POA: Diagnosis not present

## 2015-12-03 LAB — HM PAP SMEAR

## 2015-12-09 ENCOUNTER — Ambulatory Visit (INDEPENDENT_AMBULATORY_CARE_PROVIDER_SITE_OTHER): Payer: Medicare Other | Admitting: Licensed Clinical Social Worker

## 2015-12-09 ENCOUNTER — Encounter: Payer: Self-pay | Admitting: *Deleted

## 2015-12-09 DIAGNOSIS — F411 Generalized anxiety disorder: Secondary | ICD-10-CM | POA: Diagnosis not present

## 2015-12-24 ENCOUNTER — Ambulatory Visit (INDEPENDENT_AMBULATORY_CARE_PROVIDER_SITE_OTHER): Payer: Medicare Other | Admitting: Family Medicine

## 2015-12-24 ENCOUNTER — Encounter: Payer: Self-pay | Admitting: Family Medicine

## 2015-12-24 VITALS — BP 102/64 | HR 82 | Temp 97.1°F | Ht 62.5 in | Wt 121.4 lb

## 2015-12-24 DIAGNOSIS — I10 Essential (primary) hypertension: Secondary | ICD-10-CM

## 2015-12-24 DIAGNOSIS — E785 Hyperlipidemia, unspecified: Secondary | ICD-10-CM | POA: Diagnosis not present

## 2015-12-24 DIAGNOSIS — F411 Generalized anxiety disorder: Secondary | ICD-10-CM

## 2015-12-24 DIAGNOSIS — Z0000 Encounter for general adult medical examination without abnormal findings: Secondary | ICD-10-CM | POA: Diagnosis not present

## 2015-12-24 LAB — BASIC METABOLIC PANEL
BUN: 15 mg/dL (ref 6–23)
CO2: 27 mEq/L (ref 19–32)
Calcium: 9.5 mg/dL (ref 8.4–10.5)
Chloride: 103 mEq/L (ref 96–112)
Creatinine, Ser: 0.82 mg/dL (ref 0.40–1.20)
GFR: 71.67 mL/min (ref >60.00)
GLUCOSE: 86 mg/dL (ref 70–99)
Potassium: 3.8 mEq/L (ref 3.5–5.1)
Sodium: 140 mEq/L (ref 135–145)

## 2015-12-24 LAB — LIPID PANEL
CHOLESTEROL: 243 mg/dL — AB (ref 0–200)
HDL: 71.1 mg/dL (ref >39.00)
LDL CALC: 155 mg/dL — AB (ref 0–99)
NonHDL: 171.89
Total CHOL/HDL Ratio: 3
Triglycerides: 85 mg/dL (ref 0.0–149.0)
VLDL: 17 mg/dL (ref 0.0–40.0)

## 2015-12-24 NOTE — Progress Notes (Signed)
Medicare Annual Preventive Care Visit  (initial annual wellness or annual wellness exam)  Concerns and/or follow up today:  GAD/Panic Disorder/Mild depression: History:she worries excessively about everything and in particular her BP and initially was checking her BP repetitively - has ended up in the ER on several occassions with panic. I have advise cbt and pharmacotherapy - she is sensitive to medications and is very reluctant to take anything. Her husband is sick and she struggles with the change of caring for him. -today reports: seeing susan bond and is doing better, is getting help with caring for her husband and is doing a support group for caregivers -she uses a very small dose of ativan rarely for panic attacks - but has noticed this makes her feel funny, fall asleep and then not remember events - so has opted to stop taking  HLD: -reports she did not even tolerate every other day dosing of pravastatin, felt caused leg cramps -very concerned with side effects -denies falls, weakness, numbness, bowel or bladder incontinence -working on regular exercise and healthy diet - wants to check cholesterol today - FASTING  HTN: History: she is leary of meds and it has been difficult to find anything she feels comfortable taking. She has not tolerated at various times: lisinopril, norvasc and combos. She has significant anxiety and has a habit of checking her BP excessively when stressed or having panic attack and then going to the ER. -meds: hctz 12.5 -reports: doing well, occ palpitations -denies: CP, SOB, swelling  Hot flashes: -sees gyn, on estradiol and prometrium  ROS: negative for report of fevers, unintentional weight loss, vision changes, vision loss, hearing loss or change, chest pain, sob, hemoptysis, melena, hematochezia, hematuria, genital discharge or lesions, falls, bleeding or bruising, loc, thoughts of suicide or self harm, memory loss  1.) Patient-completed health risk  assessment  - completed and reviewed, see scanned documentation  2.) Review of Medical History: -PMH, PSH, Family History and current specialty and care providers reviewed and updated and listed below  - see scanned in document in chart and below  Past Medical History  Diagnosis Date  . Hemorrhoids, external   . Personal history of diseases of skin and subcutaneous tissue   . Other and unspecified hyperlipidemia   . Personal history of unspecified disease   . Need for prophylactic hormone replacement therapy (postmenopausal)   . Allergic rhinitis, cause unspecified   . Irritable bowel syndrome   . Contact dermatitis 03/16/2013  . Hypertension   . HEMORRHOIDS, EXTERNAL 02/07/2008    Qualifier: History of  By: [place], [place]    . Palpitations 02/19/2013    On-set of symptoms March '14. EKG with NSR     Past Surgical History  Procedure Laterality Date  . Benign moles removed    . Sebaceous cyst excised    . Hemorrhoid surgery    . Cataract extraction w/ intraocular lens implant Bilateral     right - Nov '11, left - Nov '13 [place] History  . Marital Status: Married    Spouse Name: N/A  . Number of Children: 4  . Years of Education: N/A   Occupational History  . retired    Social History Main Topics  . Smoking status: Former Smoker -- 0.10 packs/day for 1 years    Types: Cigarettes    Quit date: 12/12/1962  . Smokeless tobacco: Never Used     Comment: only a few years  .  Alcohol Use: Yes     Comment: rarely  . Drug Use: No  . Sexual Activity:    Partners: Male   Other Topics Concern  . Not on file   Social History Narrative   College grad. Married '61. 4 daughters, 2 grandchildren. 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      Advanced Directives: Has Living Will, HCPOA: Bryttni Wissink [phone]    Family History  Problem Relation Age of Onset  . Heart disease Father   . Cancer Sister     breast  . Heart  disease Other   . Heart disease Other   . Heart disease Other   . Cancer Sister     breast  . Hypothyroidism Sister   . Asthma Sister   . Rheum arthritis Paternal Grandmother   . Rheum arthritis Daughter     Current Outpatient Prescriptions on File Prior to Visit  Medication Sig Dispense Refill  . EPINEPHrine (EPIPEN) 0.3 mg/0.3 mL SOAJ injection Inject 0.3 mg into the muscle once as needed (for allergic reaction).    [name] estradiol (ESTRACE) 1 MG tablet Take 0.5 mg by mouth at bedtime.     . fluticasone (FLONASE) 50 MCG/ACT nasal spray Place 2 sprays into both nostrils daily. 16 g 6  . hydrochlorothiazide (MICROZIDE) 12.5 MG capsule TAKE 1 CAPSULE (12.5 MG TOTAL) BY MOUTH DAILY. 90 capsule 1  . Multiple Vitamin (MULTIVITAMIN) capsule Take 1 capsule by mouth daily.    [name] OVER THE COUNTER MEDICATION Take 1 packet by mouth daily. Dissolve one packet in a glass of water each morning. Emergen-C    . progesterone (PROMETRIUM) 100 MG capsule Take 100 mg by mouth at bedtime.     . vitamin B-12 (CYANOCOBALAMIN) 1000 MCG tablet Take 1,000 mcg by mouth daily.     No current facility-administered medications on file prior to visit.     3.) Review of functional ability and level of safety:  Any difficulty hearing?  NO  History of falling?  NO  Any trouble with IADLs - using a phone, using transportation, grocery shopping, preparing meals, doing housework, doing laundry, taking medications and managing money? NO  Advance Directives? Yes - copy request  See summary of recommendations in Patient Instructions below.  4.) Physical Exam Filed Vitals:   12/24/15 0837  BP: 102/64  Pulse: 82  Temp: 97.1 F (36.2 C)   Estimated body mass index is 21.84 kg/(m^2) as calculated from the following:   Height as of this encounter: 5' 2.5" (1.588 m).   Weight as of this encounter: 121 lb 6.4 oz (55.067 kg).  EKG (optional): deferred  General: alert, appear well hydrated and in no acute  distress  HEENT: visual acuity grossly intact  CV: HRRR  Lungs: CTA bilaterally  Psych: pleasant and cooperative, no obvious depression or anxiety  Cognitive function grossly intact  See patient instructions for recommendations.  Education and counseling regarding the above review of health provided with a plan for the following: -see scanned patient completed form for further details -fall prevention strategies discussed  -healthy lifestyle discussed -importance and resources for completing advanced directives discussed -see patient instructions below for any other recommendations provided  4)The following written screening schedule of preventive measures were reviewed with assessment and plan made per below, orders and patient instructions:      AAA screening done if applicable     Alcohol screening done     Obesity Screening and counseling done  STI screening (Hep C if born [birth year]) offered - declined     Tobacco Screening done done       Pneumococcal (PPSV23 -one dose after 64, one before if risk factors), influenza yearly and hepatitis B vaccines (if high risk - end stage renal disease, IV drugs, homosexual men, live in home for mentally retarded, hemophilia receiving factors) ASSESSMENT/PLAN: done       Screening mammograph (yearly if >40) ASSESSMENT/PLAN: utd - does with gyn      Screening Pap smear/pelvic exam (q2 years) ASSESSMENT/PLAN: does with gyn      Colorectal cancer screening (FOBT yearly or flex sig q4y or colonoscopy q10y or barium enema q4y) ASSESSMENT/PLAN: utd       Diabetes outpatient self-management training services ASSESSMENT/PLAN: utd       Bone mass measurements(covered q2y if indicated - estrogen def, osteoporosis, hyperparathyroid, vertebral abnormalities, osteoporosis or steroids) ASSESSMENT/PLAN: utd - does with gyn      Screening for glaucoma(q1y if high risk - diabetes, FH, AA and > 50 or hispanic and > 65) ASSESSMENT/PLAN: utd -  goes to [employer] nutritional therapy for individuals with diabetes or renal disease ASSESSMENT/PLAN: see orders      Cardiovascular screening blood tests (lipids q5y) ASSESSMENT/PLAN: see orders and labs      Diabetes screening tests ASSESSMENT/PLAN: see orders and labs   7.) Summary: -risk factors and conditions per above assessment were discussed and treatment, recommendations and referrals were offered per documentation above and orders and patient instructions.  Medicare annual wellness visit, subsequent  Hyperlipemia - Plan: Lipid Panel  Essential hypertension - Plan: Basic metabolic panel  Generalized anxiety disorder  Patient Instructions  BEFORE YOU LEAVE: -labs -schedule follow up in 4 months  We recommend the following healthy lifestyle measures: - eat a healthy whole foods diet consisting of regular small meals composed of vegetables, fruits, beans, nuts, seeds, healthy meats such as white chicken and fish and whole grains.  - avoid sweets, white starchy foods, fried foods, fast food, processed foods, sodas, red meet and other fattening foods.  - get a least 150-300 minutes of aerobic exercise per week.   Please bring a copy of your Advanced directives to our office  Vit D3 [phone] IU daily  -We have ordered labs or studies at this visit. It can take up to 1-2 weeks for results and processing. We will contact you with instructions IF your results are abnormal. Normal results will be released to your [facility]. If you have not heard from [place] or can not find your results in [facility] in 2 weeks please contact our office.

## 2015-12-24 NOTE — Patient Instructions (Signed)
BEFORE YOU LEAVE: -labs -schedule follow up in 4 months  We recommend the following healthy lifestyle measures: - eat a healthy whole foods diet consisting of regular small meals composed of vegetables, fruits, beans, nuts, seeds, healthy meats such as white chicken and fish and whole grains.  - avoid sweets, white starchy foods, fried foods, fast food, processed foods, sodas, red meet and other fattening foods.  - get a least 150-300 minutes of aerobic exercise per week.   Please bring a copy of your Advanced directives to our office  Vit D3 [phone] IU daily  -We have ordered labs or studies at this visit. It can take up to 1-2 weeks for results and processing. We will contact you with instructions IF your results are abnormal. Normal results will be released to your [facility]. If you have not heard from [place] or can not find your results in [facility] in 2 weeks please contact our office.

## 2015-12-24 NOTE — Progress Notes (Signed)
Pre visit review using our clinic review tool, if applicable. No additional management support is needed unless otherwise documented below in the visit note. 

## 2015-12-29 ENCOUNTER — Encounter: Payer: Self-pay | Admitting: Internal Medicine

## 2015-12-30 ENCOUNTER — Ambulatory Visit (INDEPENDENT_AMBULATORY_CARE_PROVIDER_SITE_OTHER): Payer: Medicare Other | Admitting: Licensed Clinical Social Worker

## 2015-12-30 DIAGNOSIS — F411 Generalized anxiety disorder: Secondary | ICD-10-CM

## 2016-01-12 ENCOUNTER — Encounter: Payer: Self-pay | Admitting: Family Medicine

## 2016-01-12 ENCOUNTER — Ambulatory Visit (INDEPENDENT_AMBULATORY_CARE_PROVIDER_SITE_OTHER): Payer: Medicare Other | Admitting: Family Medicine

## 2016-01-12 VITALS — BP 110/70 | HR 79 | Temp 97.6°F | Ht 62.5 in | Wt 121.7 lb

## 2016-01-12 DIAGNOSIS — R0981 Nasal congestion: Secondary | ICD-10-CM

## 2016-01-12 NOTE — Progress Notes (Signed)
Pre visit review using our clinic review tool, if applicable. No additional management support is needed unless otherwise documented below in the visit note. 

## 2016-01-12 NOTE — Progress Notes (Signed)
HPI:   Nasal congestion -started:chronic, stoppe dtaking her flonase and recurred and now has pressure on R cheek and thick nasal congestion and PND -denies:fever, SOB, NVD, tooth pain -has tried: restarted flonase today  ROS: See pertinent positives and negatives per HPI.  Past Medical History  Diagnosis Date  . Hemorrhoids, external   . Personal history of diseases of skin and subcutaneous tissue   . Other and unspecified hyperlipidemia   . Personal history of unspecified disease   . Need for prophylactic hormone replacement therapy (postmenopausal)   . Allergic rhinitis, cause unspecified   . Irritable bowel syndrome   . Contact dermatitis 03/16/2013  . Hypertension   . HEMORRHOIDS, EXTERNAL 02/07/2008    Qualifier: History of  By: [place], [place]    . Palpitations 02/19/2013    On-set of symptoms March '14. EKG with NSR     Past Surgical History  Procedure Laterality Date  . Benign moles removed    . Sebaceous cyst excised    . Hemorrhoid surgery    . Cataract extraction w/ intraocular lens implant Bilateral     right - Nov '11, left - Nov '13 [place]    Family History  Problem Relation Age of Onset  . Heart disease Father   . Cancer Sister     breast  . Heart disease Other   . Heart disease Other   . Heart disease Other   . Cancer Sister     breast  . Hypothyroidism Sister   . Asthma Sister   . Rheum arthritis Paternal Grandmother   . Rheum arthritis Daughter     Social History   Social History  . Marital Status: Married    Spouse Name: N/A  . Number of Children: 4  . Years of Education: N/A   Occupational History  . retired    Social History Main Topics  . Smoking status: Former Smoker -- 0.10 packs/day for 1 years    Types: Cigarettes    Quit date: 12/12/1962  . Smokeless tobacco: Never Used     Comment: only a few years  . Alcohol Use: Yes     Comment: rarely  . Drug Use: No  . Sexual Activity:    Partners: Male   Other  Topics Concern  . None   Social History Narrative   College grad. Married '61. 4 daughters, 2 grandchildren. 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      Advanced Directives: Has Living Will, HCPOA: [name] [phone]     Current outpatient prescriptions:  .  EPINEPHrine (EPIPEN) 0.3 mg/0.3 mL SOAJ injection, Inject 0.3 mg into the muscle once as needed (for allergic reaction)., Disp: , Rfl:  .  estradiol (ESTRACE) 1 MG tablet, Take 0.5 mg by mouth at bedtime. , Disp: , Rfl:  .  fluticasone (FLONASE) 50 MCG/ACT nasal spray, Place 2 sprays into both nostrils daily., Disp: 16 g, Rfl: 6 .  hydrochlorothiazide (MICROZIDE) 12.5 MG capsule, TAKE 1 CAPSULE (12.5 MG TOTAL) BY MOUTH DAILY., Disp: 90 capsule, Rfl: 1 .  Multiple Vitamin (MULTIVITAMIN) capsule, Take 1 capsule by mouth daily., Disp: , Rfl:  .  OVER THE COUNTER MEDICATION, Take 1 packet by mouth daily. Dissolve one packet in a glass of water each morning. Emergen-C, Disp: , Rfl:  .  progesterone (PROMETRIUM) 100 MG capsule, Take 100 mg by mouth at bedtime. , Disp: , Rfl:  .  vitamin B-12 (CYANOCOBALAMIN) 1000 MCG tablet, Take 1,000 mcg  by mouth daily., Disp: , Rfl:   EXAM:  Filed Vitals:   01/12/16 1520  BP: 110/70  Pulse: 79  Temp: 97.6 F (36.4 C)    Body mass index is 21.89 kg/(m^2).  GENERAL: vitals reviewed and listed above, alert, oriented, appears well hydrated and in no acute distress  HEENT: atraumatic, conjunttiva clear, no obvious abnormalities on inspection of external nose and ears, normal appearance of ear canals and TMs, clear nasal congestion, mild post oropharyngeal erythema with PND, no tonsillar edema or exudate, no sinus TTP  NECK: no obvious masses on inspection  MS: moves all extremities without noticeable abnormality  PSYCH: pleasant and cooperative, no obvious depression or anxiety  ASSESSMENT AND PLAN:  Discussed the following assessment and plan:  Sinus congestion  -continue  flonase 2 sprays each nostril daily for 1 month, then 1 spray -of course, we advised to return or notify a doctor immediately if symptoms worsen or persist or new concerns arise.    There are no Patient Instructions on file for this visit.   Colin Benton R.

## 2016-01-13 ENCOUNTER — Ambulatory Visit (INDEPENDENT_AMBULATORY_CARE_PROVIDER_SITE_OTHER): Payer: Medicare Other | Admitting: Licensed Clinical Social Worker

## 2016-01-13 DIAGNOSIS — F411 Generalized anxiety disorder: Secondary | ICD-10-CM

## 2016-01-15 ENCOUNTER — Encounter: Payer: Self-pay | Admitting: Family Medicine

## 2016-01-22 ENCOUNTER — Ambulatory Visit (INDEPENDENT_AMBULATORY_CARE_PROVIDER_SITE_OTHER): Payer: Medicare Other | Admitting: Family Medicine

## 2016-01-22 ENCOUNTER — Encounter: Payer: Self-pay | Admitting: Family Medicine

## 2016-01-22 VITALS — BP 126/78 | HR 75 | Temp 97.7°F | Ht 62.5 in | Wt 119.7 lb

## 2016-01-22 DIAGNOSIS — F411 Generalized anxiety disorder: Secondary | ICD-10-CM

## 2016-01-22 DIAGNOSIS — I10 Essential (primary) hypertension: Secondary | ICD-10-CM | POA: Diagnosis not present

## 2016-01-22 NOTE — Progress Notes (Signed)
HPI:  Acute visit for:  HTN/Anxiety/Panixc disorder: -husband is in Rehab -he has become combative and said mean things to her today which triggered anxiety and a panic attack -she felt like BP was up and is here to check -she does not want to take any medications for anxiety as is very anxious about side effects -sees [name] but not recently -denies: CP, SI, depression, thoughts of self harm -sister is here today and reports she and their father have bad anxiety and panic -benzos gave her amnesia  ROS: See pertinent positives and negatives per HPI.  Past Medical History  Diagnosis Date  . Hemorrhoids, external   . Personal history of diseases of skin and subcutaneous tissue   . Other and unspecified hyperlipidemia   . Personal history of unspecified disease   . Need for prophylactic hormone replacement therapy (postmenopausal)   . Allergic rhinitis, cause unspecified   . Irritable bowel syndrome   . Contact dermatitis 03/16/2013  . Hypertension   . HEMORRHOIDS, EXTERNAL 02/07/2008    Qualifier: History of  By: [place], [place]    . Palpitations 02/19/2013    On-set of symptoms March '14. EKG with NSR     Past Surgical History  Procedure Laterality Date  . Benign moles removed    . Sebaceous cyst excised    . Hemorrhoid surgery    . Cataract extraction w/ intraocular lens implant Bilateral     right - Nov '11, left - Nov '13 [facility]    Family History  Problem Relation Age of Onset  . Heart disease Father   . Cancer Sister     breast  . Heart disease Other   . Heart disease Other   . Heart disease Other   . Cancer Sister     breast  . Hypothyroidism Sister   . Asthma Sister   . Rheum arthritis Paternal Grandmother   . Rheum arthritis Daughter     Social History   Social History  . Marital Status: Married    Spouse Name: N/A  . Number of Children: 4  . Years of Education: N/A   Occupational History  . retired    Social History Main  Topics  . Smoking status: Former Smoker -- 0.10 packs/day for 1 years    Types: Cigarettes    Quit date: 12/12/1962  . Smokeless tobacco: Never Used     Comment: only a few years  . Alcohol Use: Yes     Comment: rarely  . Drug Use: No  . Sexual Activity:    Partners: Male   Other Topics Concern  . None   Social History Narrative   College grad. Married '61. 4 daughters, 2 grandchildren. 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      Advanced Directives: Has Living Will, HCPOA: Satonya Jessie [phone]     Current outpatient prescriptions:  .  EPINEPHrine (EPIPEN) 0.3 mg/0.3 mL SOAJ injection, Inject 0.3 mg into the muscle once as needed (for allergic reaction)., Disp: , Rfl:  .  estradiol (ESTRACE) 1 MG tablet, Take 0.5 mg by mouth at bedtime. , Disp: , Rfl:  .  fluticasone (FLONASE) 50 MCG/ACT nasal spray, Place 2 sprays into both nostrils daily., Disp: 16 g, Rfl: 6 .  hydrochlorothiazide (MICROZIDE) 12.5 MG capsule, TAKE 1 CAPSULE (12.5 MG TOTAL) BY MOUTH DAILY., Disp: 90 capsule, Rfl: 1 .  Multiple Vitamin (MULTIVITAMIN) capsule, Take 1 capsule by mouth daily., Disp: , Rfl:  .  OVER THE COUNTER MEDICATION, Take 1 packet by mouth daily. Dissolve one packet in a glass of water each morning. Emergen-C, Disp: , Rfl:  .  progesterone (PROMETRIUM) 100 MG capsule, Take 100 mg by mouth at bedtime. , Disp: , Rfl:  .  vitamin B-12 (CYANOCOBALAMIN) 1000 MCG tablet, Take 1,000 mcg by mouth daily., Disp: , Rfl:   EXAM:  Filed Vitals:   01/22/16 1620  BP: 126/78  Pulse: 75  Temp: 97.7 F (36.5 C)    Body mass index is 21.53 kg/(m^2).  GENERAL: vitals reviewed and listed above, alert, oriented, appears well hydrated and in no acute distress  HEENT: atraumatic, conjunttiva clear, no obvious abnormalities on inspection of external nose and ears  NECK: no obvious masses on inspection  LUNGS: clear to auscultation bilaterally, no wheezes, rales or rhonchi, good air  movement  CV: HRRR, no peripheral edema  MS: moves all extremities without noticeable abnormality  PSYCH: pleasant and cooperative, no obvious depression or anxiety  ASSESSMENT AND PLAN:  Discussed the following assessment and plan:  Essential hypertension  Generalized anxiety disorder  > 30 minutes spent face to face in counseling -bp is great on recheck after a lengthy conversation -since she does not wish to use medications for anxiety we discussed various exercises to do when is feeling overwhelmed and resources and support at home -she is going to have 24/7 help at home with her hasband when he comes home and sister is going to stay with her for now -she does not do well with breathing exercises or medication as can not relax and makes her more anxious about bodily sensations -I thinks she would do well with repetitive vocal phrases or counting and she has used counting in the past -she also agrees to set up more frequent visits for CBT and follow up here as needed  -Patient advised to return or notify a doctor immediately if symptoms worsen or persist or new concerns arise.  There are no Patient Instructions on file for this visit.   Colin Benton R.

## 2016-01-22 NOTE — Progress Notes (Signed)
Pre visit review using our clinic review tool, if applicable. No additional management support is needed unless otherwise documented below in the visit note. 

## 2016-01-25 ENCOUNTER — Telehealth: Payer: Self-pay | Admitting: *Deleted

## 2016-01-25 MED ORDER — ESCITALOPRAM OXALATE 10 MG PO TABS: 10.0000 mg | ORAL_TABLET | Freq: Every day | ORAL | Status: DC

## 2016-01-25 NOTE — Telephone Encounter (Signed)
Patient called and stated she would like for Dr Maudie Mercury to send her in a "relaxant" as she is seeing Dr Edilia Bo tomorrow but her sister feels she needs Lexapro as both of her sisters are taking this.  Please call the Rx to [facility] and the pt can be reached at her home number.

## 2016-01-25 NOTE — Telephone Encounter (Signed)
When we discussed medications at her appt she was not interested. However, if she would like to try lexapro I can send this to her pharmacy. Often times the same medicine will work well in multiple family members. This is to be taken every day. She would need a follow up appointment in 1 month. Also, this is not a medication to ever stop suddenly - would need to taper. Thanks.

## 2016-01-25 NOTE — Telephone Encounter (Signed)
No answer at the pts home number. 

## 2016-01-26 ENCOUNTER — Ambulatory Visit (INDEPENDENT_AMBULATORY_CARE_PROVIDER_SITE_OTHER): Payer: Medicare Other | Admitting: Licensed Clinical Social Worker

## 2016-01-26 DIAGNOSIS — F411 Generalized anxiety disorder: Secondary | ICD-10-CM | POA: Diagnosis not present

## 2016-01-28 NOTE — Telephone Encounter (Signed)
No answer at the pts home number. 

## 2016-02-01 ENCOUNTER — Ambulatory Visit (INDEPENDENT_AMBULATORY_CARE_PROVIDER_SITE_OTHER): Payer: Medicare Other | Admitting: Licensed Clinical Social Worker

## 2016-02-01 ENCOUNTER — Encounter: Payer: Self-pay | Admitting: Family Medicine

## 2016-02-01 ENCOUNTER — Ambulatory Visit (INDEPENDENT_AMBULATORY_CARE_PROVIDER_SITE_OTHER): Payer: Medicare Other | Admitting: Family Medicine

## 2016-02-01 VITALS — BP 120/72 | HR 70 | Temp 97.3°F | Ht 62.5 in | Wt 126.1 lb

## 2016-02-01 DIAGNOSIS — F411 Generalized anxiety disorder: Secondary | ICD-10-CM | POA: Diagnosis not present

## 2016-02-01 NOTE — Progress Notes (Signed)
HPI:  [name] is a pleasant 80 year old female with a past medical history significant for anxiety, HTN and palpitations here for an acute visit for medication side effect.  She recently had worsening anxiety and has been overwhelmed due to struggles with her husband's health. She is extremely anxious about side effects to medications, but called last week and wanted to start an SSRI for the anxiety as several family members have responded well to this. We had recently advised increasing CBT given her fear of medications. She reports he started a very low dose of the Lexapro, one quarter of a tablet, took this for several days, then one half tablet for several days. He reports she does not tolerate this medication as it makes her feel shaky, have difficulty remembering things and unlike herself. She denies fevers, chills, illness, suicidal ideation or psychosis. She is seeing her therapist, [name].  ROS: See pertinent positives and negatives per HPI.  Past Medical History  Diagnosis Date  . Hemorrhoids, external   . Personal history of diseases of skin and subcutaneous tissue   . Other and unspecified hyperlipidemia   . Personal history of unspecified disease   . Need for prophylactic hormone replacement therapy (postmenopausal)   . Allergic rhinitis, cause unspecified   . Irritable bowel syndrome   . Contact dermatitis 03/16/2013  . Hypertension   . HEMORRHOIDS, EXTERNAL 02/07/2008    Qualifier: History of  By: [place], [place]    . Palpitations 02/19/2013    On-set of symptoms March '14. EKG with NSR     Past Surgical History  Procedure Laterality Date  . Benign moles removed    . Sebaceous cyst excised    . Hemorrhoid surgery    . Cataract extraction w/ intraocular lens implant Bilateral     right - Nov '11, left - Nov '13 [facility]    Family History  Problem Relation Age of Onset  . Heart disease Father   . Cancer Sister     breast  . Heart disease Other   . Heart  disease Other   . Heart disease Other   . Cancer Sister     breast  . Hypothyroidism Sister   . Asthma Sister   . Rheum arthritis Paternal Grandmother   . Rheum arthritis Daughter     Social History   Social History  . Marital Status: Married    Spouse Name: N/A  . Number of Children: 4  . Years of Education: N/A   Occupational History  . retired    Social History Main Topics  . Smoking status: Former Smoker -- 0.10 packs/day for 1 years    Types: Cigarettes    Quit date: 12/12/1962  . Smokeless tobacco: Never Used     Comment: only a few years  . Alcohol Use: Yes     Comment: rarely  . Drug Use: No  . Sexual Activity:    Partners: Male   Other Topics Concern  . None   Social History Narrative   College grad. Married '61. 4 daughters, 2 grandchildren. 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      Advanced Directives: Has Living Will, HCPOA: Daniellemarie Niklas [phone]     Current outpatient prescriptions:  .  EPINEPHrine (EPIPEN) 0.3 mg/0.3 mL SOAJ injection, Inject 0.3 mg into the muscle once as needed (for allergic reaction)., Disp: , Rfl:  .  escitalopram (LEXAPRO) 10 MG tablet, Take 1 tablet (10 mg total) by mouth daily., Disp:  30 tablet, Rfl: 2 .  estradiol (ESTRACE) 1 MG tablet, Take 0.5 mg by mouth at bedtime. , Disp: , Rfl:  .  fluticasone (FLONASE) 50 MCG/ACT nasal spray, Place 2 sprays into both nostrils daily., Disp: 16 g, Rfl: 6 .  hydrochlorothiazide (MICROZIDE) 12.5 MG capsule, TAKE 1 CAPSULE (12.5 MG TOTAL) BY MOUTH DAILY., Disp: 90 capsule, Rfl: 1 .  Multiple Vitamin (MULTIVITAMIN) capsule, Take 1 capsule by mouth daily., Disp: , Rfl:  .  OVER THE COUNTER MEDICATION, Take 1 packet by mouth daily. Dissolve one packet in a glass of water each morning. Emergen-C, Disp: , Rfl:  .  progesterone (PROMETRIUM) 100 MG capsule, Take 100 mg by mouth at bedtime. , Disp: , Rfl:  .  vitamin B-12 (CYANOCOBALAMIN) 1000 MCG tablet, Take 1,000 mcg by mouth daily.,  Disp: , Rfl:   EXAM:  Filed Vitals:   02/01/16 1603  BP: 120/72  Pulse: 70  Temp: 97.3 F (36.3 C)    Body mass index is 22.68 kg/(m^2).  GENERAL: vitals reviewed and listed above, alert, oriented, appears well hydrated and in no acute distress  HEENT: atraumatic, conjunttiva clear, no obvious abnormalities on inspection of external nose and ears  NECK: no obvious masses on inspection  LUNGS: clear to auscultation bilaterally, no wheezes, rales or rhonchi, good air movement  CV: HRRR, no peripheral edema  MS: moves all extremities without noticeable abnormality  PSYCH: pleasant and cooperative, no obvious depression or anxiety  ASSESSMENT AND PLAN:  Discussed the following assessment and plan:  Generalized anxiety disorder  -Continue cognitive behavioral therapy -She is very sensitive to medications, and we had extreme difficulty finding a medication that worked for her blood pressure. She is very anxious about side effects to medications. -We discussed that some of her symptoms may be from her anxiety, and it is somewhat difficult to sort out whether they are from anxiety for the medication. -She has opted to continue with increased cognitive behavioral therapy for now  -She may consider a very low dose of Lexapro with a very slow titration if not improving with CBT   -She is encouraged to follow up promptly if worsening, new symptoms or any concerns  -Patient advised to return or notify a doctor immediately if symptoms worsen or persist or new concerns arise.  There are no Patient Instructions on file for this visit.   Colin Benton R.

## 2016-02-01 NOTE — Progress Notes (Signed)
Pre visit review using our clinic review tool, if applicable. No additional management support is needed unless otherwise documented below in the visit note. 

## 2016-02-01 NOTE — Telephone Encounter (Signed)
Patient came in to the office today to see Dr Maudie Mercury.

## 2016-02-02 ENCOUNTER — Telehealth: Payer: Self-pay | Admitting: *Deleted

## 2016-02-02 NOTE — Telephone Encounter (Signed)
Patient called and left a message on my voicemail at 1:07pm stating she wants to know if she is taking her medication right and stated from 2/15-2/17 she took 1/2, and then on 2/18 she took 1/2 and on 2/20 she did not take any at all.  States it is OK to leave a message with her sister Eustaquio Maize at her home number.

## 2016-02-02 NOTE — Telephone Encounter (Signed)
Patient left a message on my voicemail wanting to know if she can re-try taking Escitalopram again with taking 1/4-1/2 a day?  Please call her home number and leave a detailed message if this is safe to go ahead and take this again.

## 2016-02-02 NOTE — Telephone Encounter (Signed)
If she would like to restart despite her concerns and take a lower dose then please send the 5mg  dose of the lexapro to her pharmacy. This is a very low dose and I would advise she take the 5mg  every day for one month with follow up in 1 month to see how she is doing.

## 2016-02-03 ENCOUNTER — Telehealth: Payer: Self-pay | Admitting: Family Medicine

## 2016-02-03 MED ORDER — ESCITALOPRAM OXALATE 5 MG PO TABS: 5.0000 mg | ORAL_TABLET | Freq: Every day | ORAL | Status: DC

## 2016-02-03 NOTE — Telephone Encounter (Signed)
Patient Name: [name] DOB: 03-14-1936 Initial Comment Calling states she's having frequent urination, and dizziness. Nurse Assessment Nurse: Ronnald Ramp, RN, Miranda Date/Time (Eastern Time): 02/03/2016 12:04:16 PM Confirm and document reason for call. If symptomatic, describe symptoms. You must click the next button to save text entered. ---Caller states she is having burning when she urinates and urinary frequency that started during the night. She is feeling weak. Has the patient traveled out of the country within the last 30 days? ---Not Applicable Does the patient have any new or worsening symptoms? ---Yes Will a triage be completed? ---Yes Related visit to physician within the last 2 weeks? ---No Does the PT have any chronic conditions? (i.e. diabetes, asthma, etc.) ---No Is this a behavioral health or substance abuse call? ---No Guidelines Guideline Title Affirmed Question Affirmed Notes Urination Pain - Female Age > 50 years Final Disposition User See Physician within 24 Hours Ronnald Ramp, RN, Miranda Comments Appt scheduled for tomorrow 2/23 at 9:45am with [name] Referrals REFERRED TO PCP OFFICE Disagree/Comply: Comply

## 2016-02-03 NOTE — Telephone Encounter (Signed)
FYI

## 2016-02-03 NOTE — Addendum Note (Signed)
Addended by: Kimie Lawrence on: 02/03/2016 07:59 AM   Modules accepted: Orders

## 2016-02-03 NOTE — Telephone Encounter (Signed)
I called the pt and informed her of the message below and she is aware the Rx was sent to her pharmacy.  States she will call back for the follow up appt.

## 2016-02-04 ENCOUNTER — Encounter: Payer: Self-pay | Admitting: Family Medicine

## 2016-02-04 ENCOUNTER — Ambulatory Visit (INDEPENDENT_AMBULATORY_CARE_PROVIDER_SITE_OTHER): Payer: Medicare Other | Admitting: Family Medicine

## 2016-02-04 VITALS — BP 130/88 | HR 66 | Temp 97.5°F | Ht 62.5 in | Wt 123.3 lb

## 2016-02-04 DIAGNOSIS — R350 Frequency of micturition: Secondary | ICD-10-CM

## 2016-02-04 LAB — POCT URINALYSIS DIPSTICK
Bilirubin, UA: NEGATIVE
Blood, UA: NEGATIVE
GLUCOSE UA: NEGATIVE
Ketones, UA: NEGATIVE
Leukocytes, UA: NEGATIVE
NITRITE UA: NEGATIVE
Protein, UA: NEGATIVE
Spec Grav, UA: 1.015
UROBILINOGEN UA: 0.2
pH, UA: 6.5

## 2016-02-04 NOTE — Addendum Note (Signed)
Addended by: Anikka Lawrence on: 02/04/2016 10:35 AM   Modules accepted: Orders

## 2016-02-04 NOTE — Progress Notes (Signed)
HPI:  [name] is a very pleasant 80 yo F with a PMH significant for GAD w/ panic disorder and mild depressed mood recently, here for an acute visit for dysuria. She his extremely anxious about medication side effects and recently started lexapro for GAD and depression. He has been drinking a lot of water to "stay hydrated "and has noticed that she is urinating more frequently, about every 3-4 hours. Denies dysuria, flank pain, hematuria or fevers. She suffers from generalized worry about everything, panic attacks and depressed mood and this has worsened recently with stress related to health issues with her spouse. Her family is very involved, and her daughter is with her today. She is seen [name] for CBT. Until recently she has been very reluctant to take any medications. She did not tolerate benzodiazepines. She did not tolerate the usual starting dose of Lexapro, so is on a very slow taper starting with a very low dose. No suicidal ideation or thoughts of self-harm.  ROS: See pertinent positives and negatives per HPI.  Past Medical History  Diagnosis Date  . Hemorrhoids, external   . Personal history of diseases of skin and subcutaneous tissue   . Other and unspecified hyperlipidemia   . Personal history of unspecified disease   . Need for prophylactic hormone replacement therapy (postmenopausal)   . Allergic rhinitis, cause unspecified   . Irritable bowel syndrome   . Contact dermatitis 03/16/2013  . Hypertension   . HEMORRHOIDS, EXTERNAL 02/07/2008    Qualifier: History of  By: [place], [place]    . Palpitations 02/19/2013    On-set of symptoms March '14. EKG with NSR     Past Surgical History  Procedure Laterality Date  . Benign moles removed    . Sebaceous cyst excised    . Hemorrhoid surgery    . Cataract extraction w/ intraocular lens implant Bilateral     right - Nov '11, left - Nov '13 [facility]    Family History  Problem Relation Age of Onset  . Heart  disease Father   . Cancer Sister     breast  . Heart disease Other   . Heart disease Other   . Heart disease Other   . Cancer Sister     breast  . Hypothyroidism Sister   . Asthma Sister   . Rheum arthritis Paternal Grandmother   . Rheum arthritis Daughter     Social History   Social History  . Marital Status: Married    Spouse Name: N/A  . Number of Children: 4  . Years of Education: N/A   Occupational History  . retired    Social History Main Topics  . Smoking status: Former Smoker -- 0.10 packs/day for 1 years    Types: Cigarettes    Quit date: 12/12/1962  . Smokeless tobacco: Never Used     Comment: only a few years  . Alcohol Use: Yes     Comment: rarely  . Drug Use: No  . Sexual Activity:    Partners: Male   Other Topics Concern  . None   Social History Narrative   College grad. Married '61. 4 daughters, 2 grandchildren. 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      Advanced Directives: Has Living Will, HCPOA: [name] [phone]     Current outpatient prescriptions:  .  EPINEPHrine (EPIPEN) 0.3 mg/0.3 mL SOAJ injection, Inject 0.3 mg into the muscle once as needed (for allergic reaction)., Disp: ,  Rfl:  .  escitalopram (LEXAPRO) 5 MG tablet, Take 1 tablet (5 mg total) by mouth daily., Disp: 30 tablet, Rfl: 0 .  estradiol (ESTRACE) 1 MG tablet, Take 0.5 mg by mouth at bedtime. , Disp: , Rfl:  .  fluticasone (FLONASE) 50 MCG/ACT nasal spray, Place 2 sprays into both nostrils daily., Disp: 16 g, Rfl: 6 .  hydrochlorothiazide (MICROZIDE) 12.5 MG capsule, TAKE 1 CAPSULE (12.5 MG TOTAL) BY MOUTH DAILY., Disp: 90 capsule, Rfl: 1 .  Multiple Vitamin (MULTIVITAMIN) capsule, Take 1 capsule by mouth daily., Disp: , Rfl:  .  OVER THE COUNTER MEDICATION, Take 1 packet by mouth daily. Dissolve one packet in a glass of water each morning. Emergen-C, Disp: , Rfl:  .  progesterone (PROMETRIUM) 100 MG capsule, Take 100 mg by mouth at bedtime. , Disp: , Rfl:  .   vitamin B-12 (CYANOCOBALAMIN) 1000 MCG tablet, Take 1,000 mcg by mouth daily., Disp: , Rfl:   EXAM:  Filed Vitals:   02/04/16 0947  BP: 130/88  Pulse: 66  Temp: 97.5 F (36.4 C)    Body mass index is 22.18 kg/(m^2).  GENERAL: vitals reviewed and listed above, alert, oriented, appears well hydrated and in no acute distress  HEENT: atraumatic, conjunttiva clear, no obvious abnormalities on inspection of external nose and ears  NECK: no obvious masses on inspection  LUNGS: clear to auscultation bilaterally, no wheezes, rales or rhonchi, good air movement  CV: HRRR, no peripheral edema  MS: moves all extremities without noticeable abnormality  PSYCH: pleasant and cooperative, no obvious depression or anxiety  ASSESSMENT AND PLAN:  Discussed the following assessment and plan:  Urinary frequency - Plan: POC Urinalysis Dipstick Greater than 25 minutes spent with this patient with greater than 50% spent in face-to-face counseling: -Urine dip normal, culture pending, increased urination likely due to increased water intake and anxiety -Continue Lexapro at 5 mg with a 1 month follow-up or sooner as needed -Continue CBT, counseled extensively today discussed common symptoms of generalized anxiety and panic disorder exercises to return to when overwhelmed. -Emergency psych after care options discussed -Patient advised to return or notify a doctor immediately if symptoms worsen or persist or new concerns arise.  There are no Patient Instructions on file for this visit.   [name] R.

## 2016-02-04 NOTE — Progress Notes (Signed)
Pre visit review using our clinic review tool, if applicable. No additional management support is needed unless otherwise documented below in the visit note. 

## 2016-02-06 LAB — URINE CULTURE: Colony Count: 40000

## 2016-02-08 ENCOUNTER — Ambulatory Visit (INDEPENDENT_AMBULATORY_CARE_PROVIDER_SITE_OTHER): Payer: Medicare Other | Admitting: Licensed Clinical Social Worker

## 2016-02-08 DIAGNOSIS — F411 Generalized anxiety disorder: Secondary | ICD-10-CM

## 2016-02-08 NOTE — Addendum Note (Signed)
Addended by: Shannel Lawrence on: 02/08/2016 01:40 PM   Modules accepted: Orders

## 2016-02-10 ENCOUNTER — Ambulatory Visit: Payer: Medicare Other | Admitting: Licensed Clinical Social Worker

## 2016-02-12 NOTE — Telephone Encounter (Signed)
I called the pt and informed of the message below and she states she will try the 10mg  and call back if needed.

## 2016-02-12 NOTE — Telephone Encounter (Signed)
Patient daughter [name]  asking if mon can increase the dosage of medication Lexapro,stress level at home has increased, please call [phone]

## 2016-02-12 NOTE — Telephone Encounter (Signed)
Please find out what dose she is taking. Would prefer to go slowly with her medication given her anxiety regarding side effects. I am glad that she started cognitive behavioral therapy today and I think this will be helpful.

## 2016-02-12 NOTE — Telephone Encounter (Signed)
Ok to increase to 10mg  if pt prefers. Appt if she is feeling worse or concerns.

## 2016-02-12 NOTE — Telephone Encounter (Signed)
I called the pt and she stated she is taking Lexapro 5mg  a day.  She handed the phone to her daughter and I advised her I wanted to know the dose she is taking and the pt told me.  Her daughter stated the pt is going through something right now and prefers I talk to her instead.  I informed her I do not have anything signed to talk with her but I will send this to Dr Maudie Mercury and call her back.

## 2016-02-15 ENCOUNTER — Other Ambulatory Visit: Payer: Self-pay | Admitting: *Deleted

## 2016-02-15 MED ORDER — HYDROCHLOROTHIAZIDE 12.5 MG PO CAPS: ORAL_CAPSULE | ORAL | Status: DC

## 2016-02-17 ENCOUNTER — Ambulatory Visit (INDEPENDENT_AMBULATORY_CARE_PROVIDER_SITE_OTHER): Payer: Medicare Other | Admitting: Psychology

## 2016-02-17 DIAGNOSIS — F411 Generalized anxiety disorder: Secondary | ICD-10-CM

## 2016-02-17 DIAGNOSIS — F410 Panic disorder [episodic paroxysmal anxiety] without agoraphobia: Secondary | ICD-10-CM

## 2016-02-24 ENCOUNTER — Ambulatory Visit (INDEPENDENT_AMBULATORY_CARE_PROVIDER_SITE_OTHER): Payer: Medicare Other | Admitting: Psychology

## 2016-02-24 ENCOUNTER — Telehealth: Payer: Self-pay | Admitting: Family Medicine

## 2016-02-24 DIAGNOSIS — F411 Generalized anxiety disorder: Secondary | ICD-10-CM

## 2016-02-24 DIAGNOSIS — F410 Panic disorder [episodic paroxysmal anxiety] without agoraphobia: Secondary | ICD-10-CM

## 2016-02-24 NOTE — Telephone Encounter (Signed)
Patient came in today regarding some RX. Patient would like to discuss her escitalopram (LEXAPRO) 5 MG tablet and get a RX for loprazolam.

## 2016-02-24 NOTE — Telephone Encounter (Signed)
I called the pt and advised her per Dr Maudie Mercury she needs an office visit to discuss changing her medication.  Appt was scheduled for 3/17.

## 2016-02-26 ENCOUNTER — Ambulatory Visit (INDEPENDENT_AMBULATORY_CARE_PROVIDER_SITE_OTHER): Payer: Medicare Other | Admitting: Family Medicine

## 2016-02-26 ENCOUNTER — Encounter: Payer: Self-pay | Admitting: Family Medicine

## 2016-02-26 VITALS — BP 100/56 | HR 77 | Temp 97.3°F | Ht 62.5 in | Wt 120.1 lb

## 2016-02-26 DIAGNOSIS — F411 Generalized anxiety disorder: Secondary | ICD-10-CM

## 2016-02-26 DIAGNOSIS — F330 Major depressive disorder, recurrent, mild: Secondary | ICD-10-CM | POA: Diagnosis not present

## 2016-02-26 MED ORDER — ESCITALOPRAM OXALATE 10 MG PO TABS: 10.0000 mg | ORAL_TABLET | Freq: Every day | ORAL | Status: DC

## 2016-02-26 NOTE — Progress Notes (Signed)
HPI:  [name] is a very pleasant 80 year old here for follow-up regarding her depression and anxiety. Here with her daughter. She feels she has made progress with Lexapro, which she increased to 10 mg daily last week and CBT with [name]. Her worst symptoms now are usually at 4 AM in the morning when she will wake up feeling restless and anxious. She is wondering what she can do these movements. She talked about benzos with [name], however she is extremely anxious about taking these medications as had an amnesic event in the past on a very very low dose of Ativan.  Denies suicidal ideation, thoughts of self-harm or frequent panic.   ROS: See pertinent positives and negatives per HPI.  Past Medical History  Diagnosis Date  . Hemorrhoids, external   . Personal history of diseases of skin and subcutaneous tissue   . Other and unspecified hyperlipidemia   . Personal history of unspecified disease   . Need for prophylactic hormone replacement therapy (postmenopausal)   . Allergic rhinitis, cause unspecified   . Irritable bowel syndrome   . Contact dermatitis 03/16/2013  . Hypertension   . HEMORRHOIDS, EXTERNAL 02/07/2008    Qualifier: History of  By: [place], [place]    . Palpitations 02/19/2013    On-set of symptoms March '14. EKG with NSR     Past Surgical History  Procedure Laterality Date  . Benign moles removed    . Sebaceous cyst excised    . Hemorrhoid surgery    . Cataract extraction w/ intraocular lens implant Bilateral     right - Nov '11, left - Nov '13 [place]    Family History  Problem Relation Age of Onset  . Heart disease Father   . Cancer Sister     breast  . Heart disease Other   . Heart disease Other   . Heart disease Other   . Cancer Sister     breast  . Hypothyroidism Sister   . Asthma Sister   . Rheum arthritis Paternal Grandmother   . Rheum arthritis Daughter     Social History   Social History  . Marital Status: Married     Spouse Name: N/A  . Number of Children: 4  . Years of Education: N/A   Occupational History  . retired    Social History Main Topics  . Smoking status: Former Smoker -- 0.10 packs/day for 1 years    Types: Cigarettes    Quit date: 12/12/1962  . Smokeless tobacco: Never Used     Comment: only a few years  . Alcohol Use: Yes     Comment: rarely  . Drug Use: No  . Sexual Activity:    Partners: Male   Other Topics Concern  . None   Social History Narrative   College grad. Married '61. 4 daughters, 2 grandchildren. 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      Advanced Directives: Has Living Will, HCPOA: Nyasia Maguire [phone]     Current outpatient prescriptions:  .  EPINEPHrine (EPIPEN) 0.3 mg/0.3 mL SOAJ injection, Inject 0.3 mg into the muscle once as needed (for allergic reaction)., Disp: , Rfl:  .  escitalopram (LEXAPRO) 10 MG tablet, Take 1 tablet (10 mg total) by mouth daily., Disp: 30 tablet, Rfl: 3 .  estradiol (ESTRACE) 1 MG tablet, Take 0.5 mg by mouth at bedtime. , Disp: , Rfl:  .  fluticasone (FLONASE) 50 MCG/ACT nasal spray, Place 2 sprays into  both nostrils daily., Disp: 16 g, Rfl: 6 .  hydrochlorothiazide (MICROZIDE) 12.5 MG capsule, TAKE 1 CAPSULE (12.5 MG TOTAL) BY MOUTH DAILY., Disp: 90 capsule, Rfl: 1 .  Multiple Vitamin (MULTIVITAMIN) capsule, Take 1 capsule by mouth daily., Disp: , Rfl:  .  OVER THE COUNTER MEDICATION, Take 1 packet by mouth daily. Dissolve one packet in a glass of water each morning. Emergen-C, Disp: , Rfl:  .  progesterone (PROMETRIUM) 100 MG capsule, Take 100 mg by mouth at bedtime. , Disp: , Rfl:  .  vitamin B-12 (CYANOCOBALAMIN) 1000 MCG tablet, Take 1,000 mcg by mouth daily., Disp: , Rfl:   EXAM:  Filed Vitals:   02/26/16 0938  BP: 100/56  Pulse: 77  Temp: 97.3 F (36.3 C)    Body mass index is 21.6 kg/(m^2).  GENERAL: vitals reviewed and listed above, alert, oriented, appears well hydrated and in no acute  distress  HEENT: atraumatic, conjunttiva clear, no obvious abnormalities on inspection of external nose and ears  NECK: no obvious masses on inspection  LUNGS: clear to auscultation bilaterally, no wheezes, rales or rhonchi, good air movement  CV: HRRR, no peripheral edema  MS: moves all extremities without noticeable abnormality  PSYCH: pleasant and cooperative, no obvious depression or anxiety - affect improved from prior visits.   ASSESSMENT AND PLAN:  Discussed the following assessment and plan:  Generalized anxiety disorder  Mild episode of recurrent major depressive disorder (HCC)  - continue Lexapro 10 mg -Discussed risks and benefits of benzos and she has opted not to use these -Discussed potentially relaxing and enjoyable activities to do when she experiences moments of anxiety and sleep hygiene she may also try a low dose of melatonin before bed  -Follow up in 1 month  ient advised to return or notify a doctor immediately if symptoms worsen or persist or new concerns arise.  There are no Patient Instructions on file for this visit.   Colin Benton R.

## 2016-02-26 NOTE — Progress Notes (Signed)
Pre visit review using our clinic review tool, if applicable. No additional management support is needed unless otherwise documented below in the visit note. 

## 2016-02-29 ENCOUNTER — Other Ambulatory Visit: Payer: Self-pay | Admitting: Family Medicine

## 2016-03-02 ENCOUNTER — Ambulatory Visit (INDEPENDENT_AMBULATORY_CARE_PROVIDER_SITE_OTHER): Payer: Medicare Other | Admitting: Psychology

## 2016-03-02 DIAGNOSIS — F411 Generalized anxiety disorder: Secondary | ICD-10-CM | POA: Diagnosis not present

## 2016-03-07 ENCOUNTER — Ambulatory Visit: Payer: Self-pay | Admitting: Family Medicine

## 2016-03-09 ENCOUNTER — Ambulatory Visit (INDEPENDENT_AMBULATORY_CARE_PROVIDER_SITE_OTHER): Payer: Medicare Other | Admitting: Psychology

## 2016-03-09 DIAGNOSIS — F411 Generalized anxiety disorder: Secondary | ICD-10-CM | POA: Diagnosis not present

## 2016-03-16 ENCOUNTER — Ambulatory Visit (INDEPENDENT_AMBULATORY_CARE_PROVIDER_SITE_OTHER): Payer: Medicare Other | Admitting: Psychology

## 2016-03-16 DIAGNOSIS — F411 Generalized anxiety disorder: Secondary | ICD-10-CM

## 2016-03-18 ENCOUNTER — Ambulatory Visit (INDEPENDENT_AMBULATORY_CARE_PROVIDER_SITE_OTHER): Payer: Medicare Other | Admitting: Family Medicine

## 2016-03-18 ENCOUNTER — Encounter: Payer: Self-pay | Admitting: Family Medicine

## 2016-03-18 VITALS — BP 104/58 | HR 72 | Temp 97.7°F | Ht 62.5 in | Wt 125.3 lb

## 2016-03-18 DIAGNOSIS — I10 Essential (primary) hypertension: Secondary | ICD-10-CM

## 2016-03-18 DIAGNOSIS — E785 Hyperlipidemia, unspecified: Secondary | ICD-10-CM | POA: Diagnosis not present

## 2016-03-18 DIAGNOSIS — F331 Major depressive disorder, recurrent, moderate: Secondary | ICD-10-CM

## 2016-03-18 DIAGNOSIS — F411 Generalized anxiety disorder: Secondary | ICD-10-CM | POA: Diagnosis not present

## 2016-03-18 DIAGNOSIS — F329 Major depressive disorder, single episode, unspecified: Secondary | ICD-10-CM | POA: Insufficient documentation

## 2016-03-18 HISTORY — DX: Major depressive disorder, single episode, unspecified: F32.9

## 2016-03-18 MED ORDER — HYDROCHLOROTHIAZIDE 12.5 MG PO TABS: 6.2500 mg | ORAL_TABLET | Freq: Every day | ORAL | Status: DC

## 2016-03-18 NOTE — Patient Instructions (Signed)
BEFORE YOU LEAVE: -schedule follow up in 3-4 months  Please decrease hydrochlorothiazide to 6.25 mg  We recommend the following healthy lifestyle measures: - eat a healthy whole foods diet consisting of regular small meals composed of vegetables, fruits, beans, nuts, seeds, healthy meats such as white chicken and fish and whole grains.  - avoid sweets, white starchy foods, fried foods, fast food, processed foods, sodas, red meet and other fattening foods.  - get a least 150-300 minutes of aerobic exercise per week.

## 2016-03-18 NOTE — Progress Notes (Signed)
HPI:  Follow up:  Depression and Anxiety: -seeing [name] -very anxious about medication side effects, had amnesic event on very low dose of benzo -meds: lexapro 10 mg -reports: Doing much better, occasionally feels overwhelmed in the morning and is working on exercises with [name] for this -denies: Thoughts of self-harm, psychosis, panic  HTN/HLD: -meds: hctz 12.5 -she had extreme anxiety and was difficult to find antihypertensive that she tolerated -She feels like her blood pressure has been low since losing some weight and wonders if she can decrease her blood pressure medication -no chest pain, palpitations, shortness of breath, dizziness with standing -Trying to eat healthy and exercise  Hot flashes: -on hrt -sees gyn for this  ROS: See pertinent positives and negatives per HPI.  Past Medical History  Diagnosis Date  . Hemorrhoids, external   . Personal history of diseases of skin and subcutaneous tissue   . Other and unspecified hyperlipidemia   . Personal history of unspecified disease   . Need for prophylactic hormone replacement therapy (postmenopausal)   . Allergic rhinitis, cause unspecified   . Irritable bowel syndrome   . Contact dermatitis 03/16/2013  . Hypertension   . HEMORRHOIDS, EXTERNAL 02/07/2008    Qualifier: History of  By: [place], [place]    . Palpitations 02/19/2013    On-set of symptoms March '14. EKG with NSR   . MDD (major depressive disorder) ([place]) 03/18/2016  . Generalized anxiety disorder 02/19/2013    H/o anxiety but never had counseling or treatment. Has seen [name] - limited visit(s)     Past Surgical History  Procedure Laterality Date  . Benign moles removed    . Sebaceous cyst excised    . Hemorrhoid surgery    . Cataract extraction w/ intraocular lens implant Bilateral     right - Nov '11, left - Nov '13 [place]    Family History  Problem Relation Age of Onset  . Heart disease Father   . Cancer Sister    breast  . Heart disease Other   . Heart disease Other   . Heart disease Other   . Cancer Sister     breast  . Hypothyroidism Sister   . Asthma Sister   . Rheum arthritis Paternal Grandmother   . Rheum arthritis Daughter     Social History   Social History  . Marital Status: Married    Spouse Name: N/A  . Number of Children: 4  . Years of Education: N/A   Occupational History  . retired    Social History Main Topics  . Smoking status: Former Smoker -- 0.10 packs/day for 1 years    Types: Cigarettes    Quit date: 12/12/1962  . Smokeless tobacco: Never Used     Comment: only a few years  . Alcohol Use: Yes     Comment: rarely  . Drug Use: No  . Sexual Activity:    Partners: Male   Other Topics Concern  . None   Social History Narrative   College grad. Married '61. 4 daughters, 2 grandchildren. 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      Advanced Directives: Has Living Will, HCPOA: Jiah Hade [phone]     Current outpatient prescriptions:  .  EPINEPHrine (EPIPEN) 0.3 mg/0.3 mL SOAJ injection, Inject 0.3 mg into the muscle once as needed (for allergic reaction)., Disp: , Rfl:  .  escitalopram (LEXAPRO) 10 MG tablet, Take 1 tablet (10 mg total) by mouth daily.,  Disp: 30 tablet, Rfl: 3 .  estradiol (ESTRACE) 1 MG tablet, Take 0.5 mg by mouth at bedtime. , Disp: , Rfl:  .  fluticasone (FLONASE) 50 MCG/ACT nasal spray, Place 2 sprays into both nostrils daily., Disp: 16 g, Rfl: 6 .  Multiple Vitamin (MULTIVITAMIN) capsule, Take 1 capsule by mouth daily., Disp: , Rfl:  .  OVER THE COUNTER MEDICATION, Take 1 packet by mouth daily. Dissolve one packet in a glass of water each morning. Emergen-C, Disp: , Rfl:  .  progesterone (PROMETRIUM) 100 MG capsule, Take 100 mg by mouth at bedtime. , Disp: , Rfl:  .  vitamin B-12 (CYANOCOBALAMIN) 1000 MCG tablet, Take 1,000 mcg by mouth daily., Disp: , Rfl:  .  hydrochlorothiazide (HYDRODIURIL) 12.5 MG tablet, Take 0.5 tablets  (6.25 mg total) by mouth daily., Disp: 45 tablet, Rfl: 3  EXAM:  Filed Vitals:   03/18/16 0914  BP: 104/58  Pulse: 72  Temp: 97.7 F (36.5 C)    Body mass index is 22.54 kg/(m^2).  GENERAL: vitals reviewed and listed above, alert, oriented, appears well hydrated and in no acute distress  HEENT: atraumatic, conjunttiva clear, no obvious abnormalities on inspection of external nose and ears  NECK: no obvious masses on inspection  LUNGS: clear to auscultation bilaterally, no wheezes, rales or rhonchi, good air movement  CV: HRRR, no peripheral edema  MS: moves all extremities without noticeable abnormality  PSYCH: pleasant and cooperative, no obvious depression or anxiety  ASSESSMENT AND PLAN:  Discussed the following assessment and plan:  Moderate episode of recurrent major depressive disorder (HCC) Generalized anxiety disorder -Doing much better, continue Lexapro and CBT  Hyperlipemia -Continue exercise and healthy diet  Essential hypertension - Plan: hydrochlorothiazide (HYDRODIURIL) 12.5 MG tablet -Blood pressure is on lower side today, likely improving with weight loss in healthy lifestyle resulting in decreased need for antihypertensive   -Switched to tablet form and decreased to one half tablet, 6.25mg  daily -Follow up in 3 months, sooner if blood pressure remains low   -Patient advised to return or notify a doctor immediately if symptoms worsen or persist or new concerns arise.  Patient Instructions  BEFORE YOU LEAVE: -schedule follow up in 3-4 months  Please decrease hydrochlorothiazide to 6.25 mg  We recommend the following healthy lifestyle measures: - eat a healthy whole foods diet consisting of regular small meals composed of vegetables, fruits, beans, nuts, seeds, healthy meats such as white chicken and fish and whole grains.  - avoid sweets, white starchy foods, fried foods, fast food, processed foods, sodas, red meet and other fattening foods.  -  get a least 150-300 minutes of aerobic exercise per week.       Kathryn Benton R.

## 2016-03-18 NOTE — Progress Notes (Signed)
Pre visit review using our clinic review tool, if applicable. No additional management support is needed unless otherwise documented below in the visit note. 

## 2016-03-23 ENCOUNTER — Ambulatory Visit (INDEPENDENT_AMBULATORY_CARE_PROVIDER_SITE_OTHER): Payer: Medicare Other | Admitting: Psychology

## 2016-03-23 DIAGNOSIS — F411 Generalized anxiety disorder: Secondary | ICD-10-CM | POA: Diagnosis not present

## 2016-03-30 ENCOUNTER — Ambulatory Visit (INDEPENDENT_AMBULATORY_CARE_PROVIDER_SITE_OTHER): Payer: Medicare Other | Admitting: Psychology

## 2016-03-30 DIAGNOSIS — F411 Generalized anxiety disorder: Secondary | ICD-10-CM | POA: Diagnosis not present

## 2016-04-06 ENCOUNTER — Ambulatory Visit (INDEPENDENT_AMBULATORY_CARE_PROVIDER_SITE_OTHER): Payer: Medicare Other | Admitting: Psychology

## 2016-04-06 DIAGNOSIS — F411 Generalized anxiety disorder: Secondary | ICD-10-CM | POA: Diagnosis not present

## 2016-04-13 ENCOUNTER — Ambulatory Visit (INDEPENDENT_AMBULATORY_CARE_PROVIDER_SITE_OTHER): Payer: Medicare Other | Admitting: Psychology

## 2016-04-13 DIAGNOSIS — F411 Generalized anxiety disorder: Secondary | ICD-10-CM | POA: Diagnosis not present

## 2016-04-20 ENCOUNTER — Ambulatory Visit (INDEPENDENT_AMBULATORY_CARE_PROVIDER_SITE_OTHER): Payer: Medicare Other | Admitting: Psychology

## 2016-04-20 DIAGNOSIS — F411 Generalized anxiety disorder: Secondary | ICD-10-CM

## 2016-04-22 ENCOUNTER — Encounter: Payer: Self-pay | Admitting: Family Medicine

## 2016-04-22 ENCOUNTER — Ambulatory Visit (INDEPENDENT_AMBULATORY_CARE_PROVIDER_SITE_OTHER): Payer: Medicare Other | Admitting: Family Medicine

## 2016-04-22 ENCOUNTER — Ambulatory Visit: Payer: Self-pay | Admitting: Family Medicine

## 2016-04-22 ENCOUNTER — Telehealth: Payer: Self-pay | Admitting: Family Medicine

## 2016-04-22 VITALS — BP 110/70 | HR 77 | Temp 97.4°F | Ht 62.5 in | Wt 130.0 lb

## 2016-04-22 DIAGNOSIS — F438 Other reactions to severe stress: Secondary | ICD-10-CM

## 2016-04-22 DIAGNOSIS — F411 Generalized anxiety disorder: Secondary | ICD-10-CM

## 2016-04-22 DIAGNOSIS — F4320 Adjustment disorder, unspecified: Secondary | ICD-10-CM

## 2016-04-22 DIAGNOSIS — F4389 Other reactions to severe stress: Secondary | ICD-10-CM

## 2016-04-22 DIAGNOSIS — F331 Major depressive disorder, recurrent, moderate: Secondary | ICD-10-CM

## 2016-04-22 NOTE — Progress Notes (Signed)
HPI:  [name] is a very pleasant 80 year old here for an acute visit about her anxiety and depression. He has a long history of generalized anxiety disorder and severe anxiety regarding medications. She is struggling with caring for her husband who has been in poor health, he has been in and out of nursing homes and has dementia, and has developed some depression as well. She has had severe side effects to benzodiazepines in the past. She did finally tolerate Lexapro and was doing quite well on this, however with recent stress regarding her husband's health, she has been having some increased anxiety and feels overwhelmed at times. She wakes up every morning and a panic and does not sleep well given that he frequently needs care during the night. She has had some nights where she was able to sleep upstairs and somebody else cared for him and she slept much better, but that has been rare. She has had a few episodes in the last month where she felt overwhelmed and cried this is troubling to her daughters. No hallucinations, thoughts of self-harm or suicidal ideation. He continues to see [name] and feels this is helpful. She does have the resources to prior and does have a sitter coming in during the day 7 days a week, but has been anxious to rest or leave the home even when sitter is there as feel she needs to be around to help. ROS: See pertinent positives and negatives per HPI.  Past Medical History  Diagnosis Date  . Hemorrhoids, external   . Personal history of diseases of skin and subcutaneous tissue   . Other and unspecified hyperlipidemia   . Personal history of unspecified disease   . Need for prophylactic hormone replacement therapy (postmenopausal)   . Allergic rhinitis, cause unspecified   . Irritable bowel syndrome   . Contact dermatitis 03/16/2013  . Hypertension   . HEMORRHOIDS, EXTERNAL 02/07/2008    Qualifier: History of  By: [place], [place]    . Palpitations 02/19/2013   On-set of symptoms March '14. EKG with NSR   . MDD (major depressive disorder) ([place]) 03/18/2016  . Generalized anxiety disorder 02/19/2013    H/o anxiety but never had counseling or treatment. Has seen [name] - limited visit(s)     Past Surgical History  Procedure Laterality Date  . Benign moles removed    . Sebaceous cyst excised    . Hemorrhoid surgery    . Cataract extraction w/ intraocular lens implant Bilateral     right - Nov '11, left - Nov '13 [place]    Family History  Problem Relation Age of Onset  . Heart disease Father   . Cancer Sister     breast  . Heart disease Other   . Heart disease Other   . Heart disease Other   . Cancer Sister     breast  . Hypothyroidism Sister   . Asthma Sister   . Rheum arthritis Paternal Grandmother   . Rheum arthritis Daughter     Social History   Social History  . Marital Status: Married    Spouse Name: N/A  . Number of Children: 4  . Years of Education: N/A   Occupational History  . retired    Social History Main Topics  . Smoking status: Former Smoker -- 0.10 packs/day for 1 years    Types: Cigarettes    Quit date: 12/12/1962  . Smokeless tobacco: Never Used     Comment:  only a few years  . Alcohol Use: Yes     Comment: rarely  . Drug Use: No  . Sexual Activity:    Partners: Male   Other Topics Concern  . None   Social History Narrative   College grad. Married '61. 4 daughters, 2 grandchildren. 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      Advanced Directives: Has Living Will, HCPOA: Nishika Bacharach [phone]     Current outpatient prescriptions:  .  EPINEPHrine (EPIPEN) 0.3 mg/0.3 mL SOAJ injection, Inject 0.3 mg into the muscle once as needed (for allergic reaction)., Disp: , Rfl:  .  escitalopram (LEXAPRO) 10 MG tablet, Take 1 tablet (10 mg total) by mouth daily., Disp: 30 tablet, Rfl: 3 .  estradiol (ESTRACE) 1 MG tablet, Take 0.5 mg by mouth at bedtime. , Disp: , Rfl:  .  fluticasone  (FLONASE) 50 MCG/ACT nasal spray, Place 2 sprays into both nostrils daily., Disp: 16 g, Rfl: 6 .  hydrochlorothiazide (HYDRODIURIL) 12.5 MG tablet, Take 0.5 tablets (6.25 mg total) by mouth daily., Disp: 45 tablet, Rfl: 3 .  Multiple Vitamin (MULTIVITAMIN) capsule, Take 1 capsule by mouth daily., Disp: , Rfl:  .  OVER THE COUNTER MEDICATION, Take 1 packet by mouth daily. Dissolve one packet in a glass of water each morning. Emergen-C, Disp: , Rfl:  .  progesterone (PROMETRIUM) 100 MG capsule, Take 100 mg by mouth at bedtime. , Disp: , Rfl:  .  vitamin B-12 (CYANOCOBALAMIN) 1000 MCG tablet, Take 1,000 mcg by mouth daily., Disp: , Rfl:   EXAM:  Filed Vitals:   04/22/16 1445  BP: 110/70  Pulse: 77  Temp: 97.4 F (36.3 C)    Body mass index is 23.38 kg/(m^2).  GENERAL: vitals reviewed and listed above, alert, oriented, appears well hydrated and in no acute distress  HEENT: atraumatic, conjunttiva clear, no obvious abnormalities on inspection of external nose and ears  NECK: no obvious masses on inspection  LUNGS: clear to auscultation bilaterally, no wheezes, rales or rhonchi, good air movement  CV: HRRR, no peripheral edema  MS: moves all extremities without noticeable abnormality  PSYCH: pleasant and cooperative, tearful at times, speech and thought processing grossly intact  ASSESSMENT AND PLAN:  Discussed the following assessment and plan: Greater than 40 minutes spent with this patient with greater than 50% of the visit spent face-to-face in counseling with the patient and the daughter. Moderate episode of recurrent major depressive disorder (HCC)  Generalized anxiety disorder  Reaction to chronic stress Discussed her home situation at length. Unfortunately, I think a lot of her stress and anxiety is situational and we tried to think of ways that we can help you get her some rest as I think she is really overly tired and stressed. Suggested. Dr. and other options for a  possible night sitter 1-2 days a night, it seems they would be able to afford this, and I think this would really help to give her some rest that is needed. We talked about different options for sleep, anxiety and depression in terms of medications - she is quite anxious about side effects and does wish to stay away from benzos and sleep aids. She seems to think that increasing the dose of Lexapro might help, we did discuss that a higher dose in geriatric patients can resolve and increased risk of side effects, so she is going to try changing her home situation first. She will continue to work with [name]. She does  have a sitter during the day that she finally is starting to progress, so may try to get out of the house several days a week to do something relaxing such as going to the coffee shop or reading a book or taking a walk. He also may have her see a psychiatrist to help with medications given her concerns. Contact information was provided. Plan a follow-up in 4-6 weeks or sooner if needed. -Patient advised to return or notify a doctor immediately if symptoms worsen or persist or new concerns arise.  Patient Instructions  Please continue to work with [name].  For now continue the Lexapro at 10mg  daily.  Please set up a sitter 1-2 nights per week if you can so that you can sleep in a different part of the house and try to get a good night of rest.  Please try to get out of the house to do something fun 3-4 days per week for 1-2 hours.  If no better in a few weeks could consider upping the lexapro a little to 15mg .   Follow up in 4-6 weeks or sooner as needed.     Colin Benton R.

## 2016-04-22 NOTE — Telephone Encounter (Signed)
I called the pt and informed her of the message below.  Appt scheduled for today at 3pm.

## 2016-04-22 NOTE — Patient Instructions (Signed)
Please continue to work with [name].  For now continue the Lexapro at 10mg  daily.  Please set up a sitter 1-2 nights per week if you can so that you can sleep in a different part of the house and try to get a good night of rest.  Please try to get out of the house to do something fun 3-4 days per week for 1-2 hours.  If no better in a few weeks could consider upping the lexapro a little to 15mg .   Follow up in 4-6 weeks or sooner as needed.

## 2016-04-22 NOTE — Progress Notes (Signed)
Pre visit review using our clinic review tool, if applicable. No additional management support is needed unless otherwise documented below in the visit note. 

## 2016-04-22 NOTE — Telephone Encounter (Signed)
Please help her to schedule appointment if she thinks her symptoms are worsening or feels she needs more medication. I would not advise increasing her dose without further evaluation and discussion. Can bump up her appointment if needed, please schedule 30 minutes.

## 2016-04-22 NOTE — Telephone Encounter (Signed)
Patient states she wants to get her dose changed to 20 mg from 10 mg for her Lexapro.  Patient has an appointment for a 4 month rov on Thursday May 18th.  Pharmacy- [facility]

## 2016-04-27 ENCOUNTER — Ambulatory Visit (INDEPENDENT_AMBULATORY_CARE_PROVIDER_SITE_OTHER): Payer: Medicare Other | Admitting: Psychology

## 2016-04-27 DIAGNOSIS — F411 Generalized anxiety disorder: Secondary | ICD-10-CM | POA: Diagnosis not present

## 2016-04-28 ENCOUNTER — Ambulatory Visit: Payer: Self-pay | Admitting: Family Medicine

## 2016-05-04 ENCOUNTER — Ambulatory Visit (INDEPENDENT_AMBULATORY_CARE_PROVIDER_SITE_OTHER): Payer: Medicare Other | Admitting: Psychology

## 2016-05-04 DIAGNOSIS — F411 Generalized anxiety disorder: Secondary | ICD-10-CM | POA: Diagnosis not present

## 2016-05-06 ENCOUNTER — Ambulatory Visit: Payer: Self-pay | Admitting: Family Medicine

## 2016-05-11 ENCOUNTER — Ambulatory Visit (INDEPENDENT_AMBULATORY_CARE_PROVIDER_SITE_OTHER): Payer: Medicare Other | Admitting: Psychology

## 2016-05-11 DIAGNOSIS — F411 Generalized anxiety disorder: Secondary | ICD-10-CM | POA: Diagnosis not present

## 2016-05-18 ENCOUNTER — Ambulatory Visit (INDEPENDENT_AMBULATORY_CARE_PROVIDER_SITE_OTHER): Payer: Medicare Other | Admitting: Psychology

## 2016-05-18 DIAGNOSIS — F411 Generalized anxiety disorder: Secondary | ICD-10-CM

## 2016-05-25 ENCOUNTER — Ambulatory Visit (INDEPENDENT_AMBULATORY_CARE_PROVIDER_SITE_OTHER): Payer: Medicare Other | Admitting: Psychology

## 2016-05-25 DIAGNOSIS — F411 Generalized anxiety disorder: Secondary | ICD-10-CM | POA: Diagnosis not present

## 2016-05-31 ENCOUNTER — Ambulatory Visit (INDEPENDENT_AMBULATORY_CARE_PROVIDER_SITE_OTHER): Payer: Medicare Other | Admitting: Psychology

## 2016-05-31 DIAGNOSIS — F411 Generalized anxiety disorder: Secondary | ICD-10-CM

## 2016-06-07 ENCOUNTER — Ambulatory Visit (INDEPENDENT_AMBULATORY_CARE_PROVIDER_SITE_OTHER): Payer: Medicare Other | Admitting: Psychology

## 2016-06-07 DIAGNOSIS — F411 Generalized anxiety disorder: Secondary | ICD-10-CM | POA: Diagnosis not present

## 2016-06-20 ENCOUNTER — Ambulatory Visit: Payer: Self-pay | Admitting: Family Medicine

## 2016-06-21 ENCOUNTER — Ambulatory Visit (INDEPENDENT_AMBULATORY_CARE_PROVIDER_SITE_OTHER): Payer: Medicare Other | Admitting: Psychology

## 2016-06-21 DIAGNOSIS — F411 Generalized anxiety disorder: Secondary | ICD-10-CM | POA: Diagnosis not present

## 2016-06-28 ENCOUNTER — Ambulatory Visit (INDEPENDENT_AMBULATORY_CARE_PROVIDER_SITE_OTHER): Payer: Medicare Other | Admitting: Psychology

## 2016-06-28 DIAGNOSIS — F411 Generalized anxiety disorder: Secondary | ICD-10-CM | POA: Diagnosis not present

## 2016-07-05 ENCOUNTER — Ambulatory Visit (INDEPENDENT_AMBULATORY_CARE_PROVIDER_SITE_OTHER): Payer: Medicare Other | Admitting: Psychology

## 2016-07-05 DIAGNOSIS — F411 Generalized anxiety disorder: Secondary | ICD-10-CM

## 2016-07-19 ENCOUNTER — Ambulatory Visit: Payer: Medicare Other | Admitting: Psychology

## 2016-07-21 ENCOUNTER — Telehealth: Payer: Self-pay | Admitting: *Deleted

## 2016-07-21 NOTE — Telephone Encounter (Signed)
Patient left a message on my voicemail stating she believes she may have a UTI, her husband is in the hospital with a UTI and wanted to know what to do.  I called the pts home number and left a detailed message stating Dr Maudie Mercury is not in the office tomorrow and asked that she call back as we could get her an appt with someone else as she does need to be seen.

## 2016-07-22 ENCOUNTER — Ambulatory Visit (INDEPENDENT_AMBULATORY_CARE_PROVIDER_SITE_OTHER): Payer: Medicare Other | Admitting: Family Medicine

## 2016-07-22 ENCOUNTER — Encounter: Payer: Self-pay | Admitting: Family Medicine

## 2016-07-22 VITALS — BP 130/58 | HR 84 | Temp 97.9°F | Ht 62.5 in | Wt 133.6 lb

## 2016-07-22 DIAGNOSIS — R300 Dysuria: Secondary | ICD-10-CM

## 2016-07-22 LAB — POCT URINALYSIS DIPSTICK
BILIRUBIN UA: NEGATIVE
GLUCOSE UA: NEGATIVE
Ketones, UA: NEGATIVE
Leukocytes, UA: NEGATIVE
Nitrite, UA: NEGATIVE
PH UA: 7
Protein, UA: NEGATIVE
RBC UA: NEGATIVE
Spec Grav, UA: 1.025
UROBILINOGEN UA: 0.2

## 2016-07-22 NOTE — Progress Notes (Signed)
Pre visit review using our clinic review tool, if applicable. No additional management support is needed unless otherwise documented below in the visit note. 

## 2016-07-22 NOTE — Patient Instructions (Signed)
Stay well hydrated Follow up for any fever, blood in urine, or persistent symptoms.

## 2016-07-22 NOTE — Progress Notes (Signed)
Subjective:     Patient ID: [name], female   DOB: 1936-03-01, 80 y.o.   MRN: [id number]  HPI Patient seen as a work in with one-week history of urine frequency and occasional burning with urination. No fevers or chills. No flank pain. No nausea or vomiting. Denies any past history of frequent UTI. Allergic to Keflex. She had urine culture back in February which was basically unremarkable.  Past Medical History:  Diagnosis Date  . Allergic rhinitis, cause unspecified   . Contact dermatitis 03/16/2013  . Generalized anxiety disorder 02/19/2013   H/o anxiety but never had counseling or treatment. Has seen [name] - limited visit(s)   . Hemorrhoids, external   . HEMORRHOIDS, EXTERNAL 02/07/2008   Qualifier: History of  By: [place], [place]    . Hypertension   . Irritable bowel syndrome   . MDD (major depressive disorder) ([place]) 03/18/2016  . Need for prophylactic hormone replacement therapy (postmenopausal)   . Other and unspecified hyperlipidemia   . Palpitations 02/19/2013   On-set of symptoms March '14. EKG with NSR   . Personal history of diseases of skin and subcutaneous tissue   . Personal history of unspecified disease    Past Surgical History:  Procedure Laterality Date  . benign moles removed    . CATARACT EXTRACTION W/ INTRAOCULAR LENS IMPLANT Bilateral    right - Nov '11, left - Nov '13 [place]  . HEMORRHOID SURGERY    . sebaceous cyst excised      reports that she quit smoking about 53 years ago. Her smoking use included Cigarettes. She has a 0.10 pack-year smoking history. She has never used smokeless tobacco. She reports that she drinks alcohol. She reports that she does not use drugs. family history includes Asthma in her sister; Cancer in her sister and sister; Heart disease in her father, other, other, and other; Hypothyroidism in her sister; Rheum arthritis in her daughter and paternal grandmother. Allergies  Allergen Reactions  . Cephalexin    REACTION: diaphoretic and drop in Blood pressure  . Lorazepam Other (See Comments)    amnesia     Review of Systems  Constitutional: Negative for chills and fever.  Gastrointestinal: Negative for abdominal pain, nausea and vomiting.  Genitourinary: Positive for dysuria and frequency. Negative for hematuria.       Objective:   Physical Exam  Constitutional: She appears well-developed and well-nourished.  Cardiovascular: Normal rate and regular rhythm.   Pulmonary/Chest: Effort normal and breath sounds normal. No respiratory distress. She has no wheezes. She has no rales.       Assessment:     Dysuria. Urine dipstick completely normal. Question atrophic vaginitis    Plan:     -Stay well-hydrated -Follow-up promptly for any fever, gross hematuria, or other concerning symptoms -She is encouraged to follow-up with primary in 1-2 weeks if symptoms not resolving  Eulas Post MD [facility]

## 2016-07-26 ENCOUNTER — Ambulatory Visit (INDEPENDENT_AMBULATORY_CARE_PROVIDER_SITE_OTHER): Payer: Medicare Other | Admitting: Psychology

## 2016-07-26 DIAGNOSIS — F411 Generalized anxiety disorder: Secondary | ICD-10-CM

## 2016-08-02 ENCOUNTER — Ambulatory Visit (INDEPENDENT_AMBULATORY_CARE_PROVIDER_SITE_OTHER): Payer: Medicare Other | Admitting: Psychology

## 2016-08-02 DIAGNOSIS — F411 Generalized anxiety disorder: Secondary | ICD-10-CM | POA: Diagnosis not present

## 2016-08-09 ENCOUNTER — Ambulatory Visit (INDEPENDENT_AMBULATORY_CARE_PROVIDER_SITE_OTHER): Payer: Medicare Other | Admitting: Psychology

## 2016-08-09 DIAGNOSIS — F411 Generalized anxiety disorder: Secondary | ICD-10-CM

## 2016-08-16 ENCOUNTER — Ambulatory Visit (INDEPENDENT_AMBULATORY_CARE_PROVIDER_SITE_OTHER): Payer: Medicare Other | Admitting: Psychology

## 2016-08-16 DIAGNOSIS — F411 Generalized anxiety disorder: Secondary | ICD-10-CM | POA: Diagnosis not present

## 2016-08-28 ENCOUNTER — Other Ambulatory Visit: Payer: Self-pay | Admitting: Family Medicine

## 2016-08-29 ENCOUNTER — Encounter: Payer: Self-pay | Admitting: Family Medicine

## 2016-08-29 ENCOUNTER — Ambulatory Visit (INDEPENDENT_AMBULATORY_CARE_PROVIDER_SITE_OTHER): Payer: Medicare Other | Admitting: Family Medicine

## 2016-08-29 VITALS — BP 118/70 | HR 70 | Temp 97.4°F | Ht 62.5 in | Wt 138.2 lb

## 2016-08-29 DIAGNOSIS — E785 Hyperlipidemia, unspecified: Secondary | ICD-10-CM | POA: Diagnosis not present

## 2016-08-29 DIAGNOSIS — F411 Generalized anxiety disorder: Secondary | ICD-10-CM

## 2016-08-29 DIAGNOSIS — Z23 Encounter for immunization: Secondary | ICD-10-CM

## 2016-08-29 DIAGNOSIS — J309 Allergic rhinitis, unspecified: Secondary | ICD-10-CM

## 2016-08-29 DIAGNOSIS — I10 Essential (primary) hypertension: Secondary | ICD-10-CM

## 2016-08-29 DIAGNOSIS — F331 Major depressive disorder, recurrent, moderate: Secondary | ICD-10-CM

## 2016-08-29 LAB — LIPID PANEL
Cholesterol: 216 mg/dL — ABNORMAL HIGH (ref 0–200)
HDL: 67.8 mg/dL (ref >39.00)
LDL Cholesterol: 126 mg/dL — ABNORMAL HIGH (ref 0–99)
NONHDL: 148.23
Total CHOL/HDL Ratio: 3
Triglycerides: 112 mg/dL (ref 0.0–149.0)
VLDL: 22.4 mg/dL (ref 0.0–40.0)

## 2016-08-29 LAB — CBC
HCT: 40.5 % (ref 36.0–46.0)
Hemoglobin: 13.9 g/dL (ref 12.0–15.0)
MCHC: 34.4 g/dL (ref 30.0–36.0)
MCV: 90.8 fl (ref 78.0–100.0)
PLATELETS: 255 10*3/uL (ref 150.0–400.0)
RBC: 4.46 Mil/uL (ref 3.87–5.11)
RDW: 13.3 % (ref 11.5–15.5)
WBC: 6.4 10*3/uL (ref 4.0–10.5)

## 2016-08-29 LAB — BASIC METABOLIC PANEL
BUN: 11 mg/dL (ref 6–23)
CALCIUM: 8.9 mg/dL (ref 8.4–10.5)
CO2: 29 mEq/L (ref 19–32)
Chloride: 104 mEq/L (ref 96–112)
Creatinine, Ser: 0.8 mg/dL (ref 0.40–1.20)
GFR: 73.61 mL/min (ref >60.00)
Glucose, Bld: 99 mg/dL (ref 70–99)
Potassium: 4.1 mEq/L (ref 3.5–5.1)
SODIUM: 137 meq/L (ref 135–145)

## 2016-08-29 NOTE — Patient Instructions (Addendum)
BEFORE YOU LEAVE: -flu shot -labs -follow up: 1) follow up with [name] in 3-4 months (sooner if needed) 2) Medicare exam with [name] in January  Please let me know if the anxiety continues to be worse and you wish to increase the lexapro. I think you are doing remarkably well considering the circumstances and how you were doing in the past! Hang in there!   We recommend the following healthy lifestyle for LIFE: 1) Small portions.   Tip: eat off of a salad plate instead of a dinner plate.  Tip: It is ok to feel hungry after a meal - that likely means you ate an appropriate portion.  Tip: if you need more or a snack choose fruits, veggies and/or a handful of nuts or seeds.  2) Eat a healthy clean diet.  * Tip: Avoid (less then 1 serving per week): processed foods, sweets, sweetened drinks, white starches (rice, flour, bread, potatoes, pasta, etc), red meat, fast foods, butter  *Tip: CHOOSE instead   * 5-9 servings per day of fresh or frozen fruits and vegetables (but not corn, potatoes, bananas, canned or dried fruit)   *nuts and seeds, beans   *olives and olive oil   *small portions of lean meats such as fish and white chicken    *small portions of whole grains  3)Get at least 150 minutes of sweaty aerobic exercise per week.  4)Reduce stress - consider counseling, meditation and relaxation to balance other aspects of your life.

## 2016-08-29 NOTE — Progress Notes (Signed)
Pre visit review using our clinic review tool, if applicable. No additional management support is needed unless otherwise documented below in the visit note. 

## 2016-08-29 NOTE — Progress Notes (Signed)
HPI:  Follow up:  HTN: -meds: hctz -extreme anxiety regarding meds (has not tolerated lisinopril, norvasc and combos -denies: CP, SOB, DOE, HA  HLD: -did not tolerate statin therapy and does not want to take medications -working on diet and exercise -fasting for lab check today  GAD/MDD: -extreme anxiety regarding medications -meds: severe side effects to benzos (amnesic event on very low dose) -now taking lexapro -sees [name] for counseling -overall doing much better then in the past, felt anxious and shaky this morning and wonders if should increase med - in creased anxiety because husband in nursing home and he wants to come home but doctors feel he should stay in nursing home -saw [name] bond and [name] in the past -denies: SI, worsening depression, hallucinations  ROS: See pertinent positives and negatives per HPI.  Past Medical History:  Diagnosis Date  . Allergic rhinitis, cause unspecified   . Contact dermatitis 03/16/2013  . Generalized anxiety disorder 02/19/2013   has seen [name], [name] and now [name] for counseling  . Hemorrhoids, external   . Hot flashes   . Hypertension   . Irritable bowel syndrome   . MDD (major depressive disorder) ([place]) 03/18/2016  . Other and unspecified hyperlipidemia   . Palpitations 02/19/2013   On-set of symptoms March '14. EKG with NSR     Past Surgical History:  Procedure Laterality Date  . benign moles removed    . CATARACT EXTRACTION W/ INTRAOCULAR LENS IMPLANT Bilateral    right - Nov '11, left - Nov '13 [place]  . HEMORRHOID SURGERY    . sebaceous cyst excised      Family History  Problem Relation Age of Onset  . Heart disease Father   . Cancer Sister     breast  . Heart disease Other   . Heart disease Other   . Heart disease Other   . Cancer Sister     breast  . Hypothyroidism Sister   . Asthma Sister   . Rheum arthritis Paternal Grandmother   . Rheum arthritis Daughter      Social History   Social History  . Marital status: Married    Spouse name: N/A  . Number of children: 4  . Years of education: N/A   Occupational History  . retired    Social History Main Topics  . Smoking status: Former Smoker    Packs/day: 0.10    Years: 1.00    Types: Cigarettes    Quit date: 12/12/1962  . Smokeless tobacco: Never Used     Comment: only a few years  . Alcohol use Yes     Comment: rarely  . Drug use: No  . Sexual activity: Yes    Partners: Male   Other Topics Concern  . None   Social History Narrative   College grad. Married '61. 4 daughters, 2 grandchildren. 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      Advanced Directives: Has Living Will, HCPOA: Cadedra Abdullah [phone]     Current Outpatient Prescriptions:  .  EPINEPHrine (EPIPEN) 0.3 mg/0.3 mL SOAJ injection, Inject 0.3 mg into the muscle once as needed (for allergic reaction)., Disp: , Rfl:  .  escitalopram (LEXAPRO) 10 MG tablet, Take 1 tablet (10 mg total) by mouth daily., Disp: 30 tablet, Rfl: 3 .  estradiol (ESTRACE) 1 MG tablet, Take 0.5 mg by mouth at bedtime. , Disp: , Rfl:  .  fluticasone (FLONASE) 50 MCG/ACT nasal spray, Place  2 sprays into both nostrils daily., Disp: 16 g, Rfl: 6 .  hydrochlorothiazide (HYDRODIURIL) 12.5 MG tablet, Take 0.5 tablets (6.25 mg total) by mouth daily., Disp: 45 tablet, Rfl: 3 .  Multiple Vitamin (MULTIVITAMIN) capsule, Take 1 capsule by mouth daily., Disp: , Rfl:  .  OVER THE COUNTER MEDICATION, Take 1 packet by mouth daily. Dissolve one packet in a glass of water each morning. Emergen-C, Disp: , Rfl:  .  progesterone (PROMETRIUM) 100 MG capsule, Take 100 mg by mouth at bedtime. , Disp: , Rfl:  .  vitamin B-12 (CYANOCOBALAMIN) 1000 MCG tablet, Take 1,000 mcg by mouth daily., Disp: , Rfl:   EXAM:  Vitals:   08/29/16 1010  BP: 118/70  Pulse: 70  Temp: 97.4 F (36.3 C)    Body mass index is 24.87 kg/m.  GENERAL: vitals reviewed and listed  above, alert, oriented, appears well hydrated and in no acute distress  HEENT: atraumatic, conjunttiva clear, no obvious abnormalities on inspection of external nose and ears  NECK: no obvious masses on inspection  LUNGS: clear to auscultation bilaterally, no wheezes, rales or rhonchi, good air movement  CV: HRRR, no peripheral edema  MS: moves all extremities without noticeable abnormality  PSYCH: pleasant and cooperative, no obvious depression or anxiety  ASSESSMENT AND PLAN:  Discussed the following assessment and plan:  Essential hypertension - Plan: Basic metabolic panel, CBC (no diff)  Hyperlipemia - Plan: Lipid Panel  Generalized anxiety disorder  Moderate episode of recurrent major depressive disorder (HCC)  Allergic rhinitis, unspecified allergic rhinitis type  -labs, flu shot -medicare exam with [name] in January -discussed potential causes tremor - likely anxiety and possibly mild essential tremor most likely -discussed tx options for anxiety - she prefers to avoid benzos, may consider increasing ssri if worsening a persistent issue -plans to continue CBT -Patient advised to return or notify a doctor immediately if symptoms worsen or persist or new concerns arise.  Patient Instructions  BEFORE YOU LEAVE: -flu shot -labs -follow up: 1) follow up with [name] in 3-4 months (sooner if needed) 2) Medicare exam with [name] in January  Please let me know if the anxiety continues to be worse and you wish to increase the lexapro. I think you are doing remarkably well considering the circumstances and how you were doing in the past! Hang in there!   We recommend the following healthy lifestyle for LIFE: 1) Small portions.   Tip: eat off of a salad plate instead of a dinner plate.  Tip: It is ok to feel hungry after a meal - that likely means you ate an appropriate portion.  Tip: if you need more or a snack choose fruits, veggies and/or a handful of nuts or  seeds.  2) Eat a healthy clean diet.  * Tip: Avoid (less then 1 serving per week): processed foods, sweets, sweetened drinks, white starches (rice, flour, bread, potatoes, pasta, etc), red meat, fast foods, butter  *Tip: CHOOSE instead   * 5-9 servings per day of fresh or frozen fruits and vegetables (but not corn, potatoes, bananas, canned or dried fruit)   *nuts and seeds, beans   *olives and olive oil   *small portions of lean meats such as fish and white chicken    *small portions of whole grains  3)Get at least 150 minutes of sweaty aerobic exercise per week.  4)Reduce stress - consider counseling, meditation and relaxation to balance other aspects of your life.    [name].,  DO

## 2016-08-30 ENCOUNTER — Ambulatory Visit (INDEPENDENT_AMBULATORY_CARE_PROVIDER_SITE_OTHER): Payer: Medicare Other | Admitting: Psychology

## 2016-08-30 DIAGNOSIS — F411 Generalized anxiety disorder: Secondary | ICD-10-CM

## 2016-09-01 ENCOUNTER — Telehealth: Payer: Self-pay | Admitting: Family Medicine

## 2016-09-01 NOTE — Telephone Encounter (Signed)
Patient called back today and I informed her of the results again and she stated she does recall going over this on 9/19 but was not sure since she had a message on her voicemail to call the office.

## 2016-09-01 NOTE — Telephone Encounter (Signed)
Pt is returning [name] call °

## 2016-09-06 ENCOUNTER — Ambulatory Visit (INDEPENDENT_AMBULATORY_CARE_PROVIDER_SITE_OTHER): Payer: Medicare Other | Admitting: Psychology

## 2016-09-06 DIAGNOSIS — F411 Generalized anxiety disorder: Secondary | ICD-10-CM | POA: Diagnosis not present

## 2016-09-13 ENCOUNTER — Ambulatory Visit (INDEPENDENT_AMBULATORY_CARE_PROVIDER_SITE_OTHER): Payer: Medicare Other | Admitting: Psychology

## 2016-09-13 DIAGNOSIS — F411 Generalized anxiety disorder: Secondary | ICD-10-CM | POA: Diagnosis not present

## 2016-09-20 ENCOUNTER — Ambulatory Visit (INDEPENDENT_AMBULATORY_CARE_PROVIDER_SITE_OTHER): Payer: Medicare Other | Admitting: Psychology

## 2016-09-20 DIAGNOSIS — F411 Generalized anxiety disorder: Secondary | ICD-10-CM

## 2016-09-27 ENCOUNTER — Ambulatory Visit (INDEPENDENT_AMBULATORY_CARE_PROVIDER_SITE_OTHER): Payer: Medicare Other | Admitting: Psychology

## 2016-09-27 DIAGNOSIS — F411 Generalized anxiety disorder: Secondary | ICD-10-CM

## 2016-10-04 ENCOUNTER — Ambulatory Visit (INDEPENDENT_AMBULATORY_CARE_PROVIDER_SITE_OTHER): Payer: Medicare Other | Admitting: Psychology

## 2016-10-04 DIAGNOSIS — F411 Generalized anxiety disorder: Secondary | ICD-10-CM

## 2016-10-06 DIAGNOSIS — N760 Acute vaginitis: Secondary | ICD-10-CM | POA: Diagnosis not present

## 2016-10-11 ENCOUNTER — Ambulatory Visit (INDEPENDENT_AMBULATORY_CARE_PROVIDER_SITE_OTHER): Payer: Medicare Other | Admitting: Psychology

## 2016-10-11 DIAGNOSIS — F411 Generalized anxiety disorder: Secondary | ICD-10-CM

## 2016-10-17 DIAGNOSIS — R300 Dysuria: Secondary | ICD-10-CM | POA: Diagnosis not present

## 2016-10-18 ENCOUNTER — Ambulatory Visit (INDEPENDENT_AMBULATORY_CARE_PROVIDER_SITE_OTHER): Payer: Medicare Other | Admitting: Psychology

## 2016-10-18 DIAGNOSIS — F411 Generalized anxiety disorder: Secondary | ICD-10-CM | POA: Diagnosis not present

## 2016-10-25 ENCOUNTER — Ambulatory Visit (INDEPENDENT_AMBULATORY_CARE_PROVIDER_SITE_OTHER): Payer: Medicare Other | Admitting: Psychology

## 2016-10-25 DIAGNOSIS — F411 Generalized anxiety disorder: Secondary | ICD-10-CM

## 2016-11-01 ENCOUNTER — Ambulatory Visit (INDEPENDENT_AMBULATORY_CARE_PROVIDER_SITE_OTHER): Payer: Medicare Other | Admitting: Psychology

## 2016-11-01 DIAGNOSIS — F411 Generalized anxiety disorder: Secondary | ICD-10-CM | POA: Diagnosis not present

## 2016-11-11 DIAGNOSIS — H401232 Low-tension glaucoma, bilateral, moderate stage: Secondary | ICD-10-CM | POA: Diagnosis not present

## 2016-11-18 ENCOUNTER — Ambulatory Visit: Payer: Self-pay | Admitting: Family Medicine

## 2016-11-21 ENCOUNTER — Ambulatory Visit: Payer: Medicare Other | Admitting: Family Medicine

## 2016-11-27 NOTE — Progress Notes (Signed)
HPI:  [name] is a pleasant 80 yo here for follow up. PMH significant for MDD, Anxiety, Hypertension, Hyperlipidemia and hot flashes. Reports doing well for the most part. She did have a UTI, treated by her gynecologist. No dysuria. Does have occasional back pain and wanted to recheck urine dip today. Her husband is now at [employer]. This has been really hard on her. She has been stressed and depressed at times about leaving him there, as he is not happy about this. She reports she sometimes feels like she has walked away against his will, but knows it is for his best given his doctor's advise this. She continues to take the Lexapro and is working on the exercises that were provided by her counselor. She still has some anxiety and depression the mornings, but this usually wears off with doing her exercises and stay busy. Denies thoughts of self harm, suicidal ideation, chest pain, shortness of breath.  Summary major medical issues below:  HTN: -meds: hctz -extreme anxiety regarding meds (has not tolerated lisinopril, norvasc and combos)  HLD: -did not tolerate statin therapy and does not want to take medications -working on diet and exercise  GAD/MDD: -extreme anxiety regarding medications -meds: severe side effects to benzos (amnesic event on very low dose) -now taking lexapro -sees [name] for counseling -overall doing much better then in the past -saw [name] bond and [name] in the past  ROS: See pertinent positives and negatives per HPI.  Past Medical History:  Diagnosis Date  . Allergic rhinitis, cause unspecified   . Contact dermatitis 03/16/2013  . Generalized anxiety disorder 02/19/2013   has seen [name], [name] and now [name] for counseling  . Hemorrhoids, external   . Hot flashes   . Hypertension   . Irritable bowel syndrome   . MDD (major depressive disorder) 03/18/2016  . Other and unspecified hyperlipidemia   . Palpitations 02/19/2013   On-set of symptoms March '14. EKG with NSR     Past Surgical History:  Procedure Laterality Date  . benign moles removed    . CATARACT EXTRACTION W/ INTRAOCULAR LENS IMPLANT Bilateral    right - Nov '11, left - Nov '13 [place]  . HEMORRHOID SURGERY    . sebaceous cyst excised      Family History  Problem Relation Age of Onset  . Heart disease Father   . Cancer Sister     breast  . Heart disease Other   . Heart disease Other   . Heart disease Other   . Cancer Sister     breast  . Hypothyroidism Sister   . Asthma Sister   . Rheum arthritis Paternal Grandmother   . Rheum arthritis Daughter     Social History   Social History  . Marital status: Married    Spouse name: N/A  . Number of children: 4  . Years of education: N/A   Occupational History  . retired    Social History Main Topics  . Smoking status: Former Smoker    Packs/day: 0.10    Years: 1.00    Types: Cigarettes    Quit date: 12/12/1962  . Smokeless tobacco: Never Used     Comment: only a few years  . Alcohol use Yes     Comment: rarely  . Drug use: No  . Sexual activity: Yes    Partners: Male   Other Topics Concern  . 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 4 daughters, 2 grandchildren. 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      Advanced Directives: Has Living Will, HCPOA: Floralee Brawdy [phone]     Current Outpatient Prescriptions:  .  EPINEPHrine (EPIPEN) 0.3 mg/0.3 mL SOAJ injection, Inject 0.3 mg into the muscle once as needed (for allergic reaction)., Disp: , Rfl:  .  escitalopram (LEXAPRO) 10 MG tablet, TAKE 1 TABLET (10 MG TOTAL) BY MOUTH DAILY., Disp: 30 tablet, Rfl: 3 .  estradiol (ESTRACE) 1 MG tablet, Take 0.5 mg by mouth at bedtime. , Disp: , Rfl:  .  fluticasone (FLONASE) 50 MCG/ACT nasal spray, Place 2 sprays into both nostrils daily., Disp: 16 g, Rfl: 6 .  hydrochlorothiazide (HYDRODIURIL) 12.5 MG tablet, Take 0.5 tablets (6.25 mg total) by  mouth daily., Disp: 45 tablet, Rfl: 3 .  Multiple Vitamin (MULTIVITAMIN) capsule, Take 1 capsule by mouth daily., Disp: , Rfl:  .  OVER THE COUNTER MEDICATION, Take 1 packet by mouth daily. Dissolve one packet in a glass of water each morning. Emergen-C, Disp: , Rfl:  .  progesterone (PROMETRIUM) 100 MG capsule, Take 100 mg by mouth at bedtime. , Disp: , Rfl:  .  vitamin B-12 (CYANOCOBALAMIN) 1000 MCG tablet, Take 1,000 mcg by mouth daily., Disp: , Rfl:   EXAM:  Vitals:   11/28/16 0836  BP: 124/80  Pulse: (!) 53  Temp: 97.5 F (36.4 C)    Body mass index is 24.28 kg/m.  GENERAL: vitals reviewed and listed above, alert, oriented, appears well hydrated and in no acute distress  HEENT: atraumatic, conjunttiva clear, no obvious abnormalities on inspection of external nose and ears  NECK: no obvious masses on inspection  LUNGS: clear to auscultation bilaterally, no wheezes, rales or rhonchi, good air movement  CV: HRRR, no peripheral edema  ABD: no cva ttp  MS: moves all extremities without noticeable abnormality  PSYCH: pleasant and cooperative, no obvious depression or anxiety  ASSESSMENT AND PLAN:  Discussed the following assessment and plan:  Back pain, unspecified back location, unspecified back pain laterality, unspecified chronicity - Plan: POC Urinalysis Dipstick  Hyperlipidemia, unspecified hyperlipidemia type  Generalized anxiety disorder  Essential hypertension - Plan: Basic metabolic panel  Moderate episode of recurrent major depressive disorder (HCC)  Urinary tract infection without hematuria, site unspecified  -udip good, suspect muscular and she plans to follow up if persists, mild -discussed options for her depression which is situational, chronic morning anxiety and she plans to continue non pharmacological interventions for this other then continuations of the lexapro -BP looks good, lipids improving - lifestyle recs -medicare exam in January, follow  up with me in 3-4 months -Patient advised to return or notify a doctor immediately if symptoms worsen or persist or new concerns arise.  Patient Instructions  BEFORE YOU LEAVE: -labs -follow up: -1) Medicare exam with [name] in January  -2) follow up with [name] in 4 months  We have ordered labs or studies at this visit. It can take up to 1-2 weeks for results and processing. IF results require follow up or explanation, we will call you with instructions. Clinically stable results will be released to your [facility]. If you have not heard from [place] or cannot find your results in [facility] in 2 weeks please contact our office at [phone].  If you are not yet signed up for [facility], please consider signing up.           Colin Benton R., DO

## 2016-11-28 ENCOUNTER — Ambulatory Visit (INDEPENDENT_AMBULATORY_CARE_PROVIDER_SITE_OTHER): Payer: Medicare Other | Admitting: Family Medicine

## 2016-11-28 ENCOUNTER — Encounter: Payer: Self-pay | Admitting: Family Medicine

## 2016-11-28 VITALS — BP 124/80 | HR 53 | Temp 97.5°F | Ht 62.5 in | Wt 134.9 lb

## 2016-11-28 DIAGNOSIS — F331 Major depressive disorder, recurrent, moderate: Secondary | ICD-10-CM

## 2016-11-28 DIAGNOSIS — N390 Urinary tract infection, site not specified: Secondary | ICD-10-CM

## 2016-11-28 DIAGNOSIS — I10 Essential (primary) hypertension: Secondary | ICD-10-CM

## 2016-11-28 DIAGNOSIS — E785 Hyperlipidemia, unspecified: Secondary | ICD-10-CM | POA: Diagnosis not present

## 2016-11-28 DIAGNOSIS — M549 Dorsalgia, unspecified: Secondary | ICD-10-CM | POA: Diagnosis not present

## 2016-11-28 DIAGNOSIS — F411 Generalized anxiety disorder: Secondary | ICD-10-CM

## 2016-11-28 LAB — POCT URINALYSIS DIPSTICK
Bilirubin, UA: NEGATIVE
Blood, UA: NEGATIVE
GLUCOSE UA: NEGATIVE
KETONES UA: NEGATIVE
LEUKOCYTES UA: NEGATIVE
Nitrite, UA: NEGATIVE
PH UA: 8
Protein, UA: NEGATIVE
Spec Grav, UA: 1.015
Urobilinogen, UA: 0.2

## 2016-11-28 NOTE — Patient Instructions (Signed)
BEFORE YOU LEAVE: -labs -follow up: -1) Medicare exam with [name] in January  -2) follow up with [name] in 4 months  We have ordered labs or studies at this visit. It can take up to 1-2 weeks for results and processing. IF results require follow up or explanation, we will call you with instructions. Clinically stable results will be released to your [facility]. If you have not heard from [place] or cannot find your results in [facility] in 2 weeks please contact our office at [phone].  If you are not yet signed up for [facility], please consider signing up.

## 2016-11-28 NOTE — Progress Notes (Signed)
Pre visit review using our clinic review tool, if applicable. No additional management support is needed unless otherwise documented below in the visit note. 

## 2016-11-29 ENCOUNTER — Ambulatory Visit (INDEPENDENT_AMBULATORY_CARE_PROVIDER_SITE_OTHER): Payer: Medicare Other | Admitting: Licensed Clinical Social Worker

## 2016-11-29 DIAGNOSIS — F411 Generalized anxiety disorder: Secondary | ICD-10-CM

## 2016-12-14 DIAGNOSIS — L820 Inflamed seborrheic keratosis: Secondary | ICD-10-CM | POA: Diagnosis not present

## 2016-12-14 DIAGNOSIS — L821 Other seborrheic keratosis: Secondary | ICD-10-CM | POA: Diagnosis not present

## 2016-12-14 DIAGNOSIS — D229 Melanocytic nevi, unspecified: Secondary | ICD-10-CM | POA: Diagnosis not present

## 2016-12-14 DIAGNOSIS — D492 Neoplasm of unspecified behavior of bone, soft tissue, and skin: Secondary | ICD-10-CM | POA: Diagnosis not present

## 2016-12-16 DIAGNOSIS — Z1231 Encounter for screening mammogram for malignant neoplasm of breast: Secondary | ICD-10-CM | POA: Diagnosis not present

## 2016-12-21 DIAGNOSIS — H401232 Low-tension glaucoma, bilateral, moderate stage: Secondary | ICD-10-CM | POA: Diagnosis not present

## 2016-12-30 ENCOUNTER — Other Ambulatory Visit: Payer: Self-pay | Admitting: Family Medicine

## 2016-12-30 ENCOUNTER — Ambulatory Visit: Payer: Self-pay

## 2017-01-02 ENCOUNTER — Ambulatory Visit (INDEPENDENT_AMBULATORY_CARE_PROVIDER_SITE_OTHER): Payer: Medicare Other | Admitting: Licensed Clinical Social Worker

## 2017-01-02 DIAGNOSIS — F411 Generalized anxiety disorder: Secondary | ICD-10-CM | POA: Diagnosis not present

## 2017-01-10 ENCOUNTER — Ambulatory Visit: Payer: Self-pay | Admitting: Family Medicine

## 2017-01-10 ENCOUNTER — Ambulatory Visit (INDEPENDENT_AMBULATORY_CARE_PROVIDER_SITE_OTHER): Payer: Medicare Other | Admitting: Family Medicine

## 2017-01-10 ENCOUNTER — Ambulatory Visit (INDEPENDENT_AMBULATORY_CARE_PROVIDER_SITE_OTHER): Payer: Medicare Other

## 2017-01-10 VITALS — BP 164/80 | HR 74 | Ht 63.5 in | Wt 136.2 lb

## 2017-01-10 DIAGNOSIS — I10 Essential (primary) hypertension: Secondary | ICD-10-CM

## 2017-01-10 DIAGNOSIS — F331 Major depressive disorder, recurrent, moderate: Secondary | ICD-10-CM | POA: Diagnosis not present

## 2017-01-10 DIAGNOSIS — E2839 Other primary ovarian failure: Secondary | ICD-10-CM | POA: Diagnosis not present

## 2017-01-10 DIAGNOSIS — F411 Generalized anxiety disorder: Secondary | ICD-10-CM | POA: Diagnosis not present

## 2017-01-10 DIAGNOSIS — Z0000 Encounter for general adult medical examination without abnormal findings: Secondary | ICD-10-CM

## 2017-01-10 NOTE — Progress Notes (Signed)
HPI:  [name] is a pleasant 81 yo with a PMH significant for HTN, Anxiety and MDD here fore follow up. She saw [name] for her Wellness visit today - appreciate care. Due for bone density screening and would like me to order through [place] - reports had normal exam 10 years ago but can not remember where and that place is now closed. Report mood is ok on lexapro 10mg  daily. Getting regular counseling and this helps. Still with some anxiety in the morning which resolves with getting up and she does not want to take a medication for this. No SI, thoughts of self harm or hallucinations. No CP, SOB or DOE.  ROS: See pertinent positives and negatives per HPI.  Past Medical History:  Diagnosis Date  . Allergic rhinitis, cause unspecified   . Contact dermatitis 03/16/2013  . Generalized anxiety disorder 02/19/2013   has seen [name], [name] and now [name] for counseling  . Hemorrhoids, external   . Hot flashes   . Hypertension   . Irritable bowel syndrome   . MDD (major depressive disorder) 03/18/2016  . Other and unspecified hyperlipidemia   . Palpitations 02/19/2013   On-set of symptoms March '14. EKG with NSR     Past Surgical History:  Procedure Laterality Date  . benign moles removed    . CATARACT EXTRACTION W/ INTRAOCULAR LENS IMPLANT Bilateral    right - Nov '11, left - Nov '13 [place]  . HEMORRHOID SURGERY    . sebaceous cyst excised      Family History  Problem Relation Age of Onset  . Heart disease Father   . Cancer Sister     breast  . Heart disease Other   . Heart disease Other   . Heart disease Other   . Cancer Sister     breast  . Hypothyroidism Sister   . Asthma Sister   . Rheum arthritis Paternal Grandmother   . Rheum arthritis Daughter     Social History   Social History  . Marital status: Married    Spouse name: N/A  . Number of children: 4  . Years of education: N/A   Occupational History  . retired    Social History Main  Topics  . Smoking status: Former Smoker    Packs/day: 0.10    Years: 1.00    Types: Cigarettes    Quit date: 12/12/1962  . Smokeless tobacco: Never Used     Comment: smoked for 3 months in college   . Alcohol use Yes     Comment: rarely  . Drug use: No  . Sexual activity: Yes    Partners: Male   Other Topics Concern  . Not on file   Social History Narrative   College grad. Married '61. 4 daughters, 2 grandchildren. 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   Advanced Directives: Has Living Will, HCPOA: Markisha Ostenson [phone]        Current Outpatient Prescriptions:  .  EPINEPHrine (EPIPEN) 0.3 mg/0.3 mL SOAJ injection, Inject 0.3 mg into the muscle once as needed (for allergic reaction)., Disp: , Rfl:  .  escitalopram (LEXAPRO) 10 MG tablet, TAKE 1 TABLET (10 MG TOTAL) BY MOUTH DAILY., Disp: 30 tablet, Rfl: 3 .  estradiol (ESTRACE) 1 MG tablet, Take 0.5 mg by mouth at bedtime. , Disp: , Rfl:  .  fluticasone (FLONASE) 50 MCG/ACT nasal spray, Place 2 sprays into both nostrils daily., Disp: 16 g, Rfl: 6 .  hydrochlorothiazide (HYDRODIURIL) 12.5 MG tablet, Take 0.5 tablets (6.25 mg total) by mouth daily., Disp: 45 tablet, Rfl: 3 .  Multiple Vitamin (MULTIVITAMIN) capsule, Take 1 capsule by mouth daily., Disp: , Rfl:  .  OVER THE COUNTER MEDICATION, Take 1 packet by mouth daily. Dissolve one packet in a glass of water each morning. Emergen-C, Disp: , Rfl:  .  progesterone (PROMETRIUM) 100 MG capsule, Take 100 mg by mouth at bedtime. , Disp: , Rfl:  .  vitamin B-12 (CYANOCOBALAMIN) 1000 MCG tablet, Take 1,000 mcg by mouth daily., Disp: , Rfl:   EXAM: See vital from Wellness visit. There were no vitals filed for this visit.  There is no height or weight on file to calculate BMI.  GENERAL: vitals reviewed and listed above, alert, oriented, appears well hydrated and in no acute distress  HEENT: atraumatic, conjunttiva clear, no obvious abnormalities on inspection of external  nose and ears  NECK: no obvious masses on inspection  LUNGS: clear to auscultation bilaterally, no wheezes, rales or rhonchi, good air movement  CV: HRRR, no peripheral edema  MS: moves all extremities without noticeable abnormality  PSYCH: pleasant and cooperative, no obvious depression or anxiety  ASSESSMENT AND PLAN:  Discussed the following assessment and plan:  Generalized anxiety disorder Moderate episode of recurrent major depressive disorder (HCC) -cont current tx  Essential hypertension -advised recheck with me in 1 week -labs that day  Estrogen deficiency - Plan: DG Bone Density  -Patient advised to return or notify a doctor immediately if symptoms worsen or persist or new concerns arise.  There are no Patient Instructions on file for this visit.  Colin Benton R., DO

## 2017-01-10 NOTE — Patient Instructions (Addendum)
[name] , Thank you for taking time to come for your Medicare Wellness Visit. I appreciate your ongoing commitment to your health goals. Please review the following plan we discussed and let me know if I can assist you in the future.   May be good to have another bone density;  Or ask GYN for a copy of your report to share with [name]  GYN Dr. [name]  Will also bring copy of last mammogram    These are the goals we discussed: Goals    . patient          Getting up happy and feeling good        This is a list of the screening recommended for you and due dates:  Health Maintenance  Topic Date Due  . DEXA scan (bone density measurement)  01/17/2003  . Mammogram  02/26/2017*  . Tetanus Vaccine  10/31/2024  . Flu Shot  Addressed  . Shingles Vaccine  Completed  . Pneumonia vaccines  Completed  *Topic was postponed. The date shown is not the original due date.        Bone Densitometry Introduction Bone densitometry is an imaging test that uses a special X-ray to measure the amount of calcium and other minerals in your bones (bone density). This test is also known as a bone mineral density test or dual-energy X-ray absorptiometry (DXA). The test can measure bone density at your hip and your spine. It is similar to having a regular X-ray. You may have this test to:  Diagnose a condition that causes weak or thin bones (osteoporosis).  Predict your risk of a broken bone (fracture).  Determine how well osteoporosis treatment is working. Tell a health care provider about:  Any allergies you have.  All medicines you are taking, including vitamins, herbs, eye drops, creams, and over-the-counter medicines.  Any problems you or family members have had with anesthetic medicines.  Any blood disorders you have.  Any surgeries you have had.  Any medical conditions you have.  Possibility of pregnancy.  Any other medical test you had within the previous 14  days that used contrast material. What are the risks? Generally, this is a safe procedure. However, problems can occur and may include the following:  This test exposes you to a very small amount of radiation.  The risks of radiation exposure may be greater to unborn children. What happens before the procedure?  Do not take any calcium supplements for 24 hours before having the test. You can otherwise eat and drink what you usually do.  Take off all metal jewelry, eyeglasses, dental appliances, and any other metal objects. What happens during the procedure?  You may lie on an exam table. There will be an X-ray generator below you and an imaging device above you.  Other devices, such as boxes or braces, may be used to position your body properly for the scan.  You will need to lie still while the machine slowly scans your body.  The images will show up on a computer monitor. What happens after the procedure? You may need more testing at a later time. This information is not intended to replace advice given to you by your health care provider. Make sure you discuss any questions you have with your health care provider. Document Released: 12/20/2004 Document Revised: 05/05/2016 Document Reviewed: 05/08/2014  2017 [employer]vier   Fall Prevention in the Home Introduction Falls can cause injuries. They can happen to  people of all ages. There are many things you can do to make your home safe and to help prevent falls. What can I do on the outside of my home?  Regularly fix the edges of walkways and driveways and fix any cracks.  Remove anything that might make you trip as you walk through a door, such as a raised step or threshold.  Trim any bushes or trees on the path to your home.  Use bright outdoor lighting.  Clear any walking paths of anything that might make someone trip, such as rocks or tools.  Regularly check to see if handrails are loose or broken. Make sure that both sides  of any steps have handrails.  Any raised decks and porches should have guardrails on the edges.  Have any leaves, snow, or ice cleared regularly.  Use sand or salt on walking paths during winter.  Clean up any spills in your garage right away. This includes oil or grease spills. What can I do in the bathroom?  Use night lights.  Install grab bars by the toilet and in the tub and shower. Do not use towel bars as grab bars.  Use non-skid mats or decals in the tub or shower.  If you need to sit down in the shower, use a plastic, non-slip stool.  Keep the floor dry. Clean up any water that spills on the floor as soon as it happens.  Remove soap buildup in the tub or shower regularly.  Attach bath mats securely with double-sided non-slip rug tape.  Do not have throw rugs and other things on the floor that can make you trip. What can I do in the bedroom?  Use night lights.  Make sure that you have a light by your bed that is easy to reach.  Do not use any sheets or blankets that are too big for your bed. They should not hang down onto the floor.  Have a firm chair that has side arms. You can use this for support while you get dressed.  Do not have throw rugs and other things on the floor that can make you trip. What can I do in the kitchen?  Clean up any spills right away.  Avoid walking on wet floors.  Keep items that you use a lot in easy-to-reach places.  If you need to reach something above you, use a strong step stool that has a grab bar.  Keep electrical cords out of the way.  Do not use floor polish or wax that makes floors slippery. If you must use wax, use non-skid floor wax.  Do not have throw rugs and other things on the floor that can make you trip. What can I do with my stairs?  Do not leave any items on the stairs.  Make sure that there are handrails on both sides of the stairs and use them. Fix handrails that are broken or loose. Make sure that  handrails are as long as the stairways.  Check any carpeting to make sure that it is firmly attached to the stairs. Fix any carpet that is loose or worn.  Avoid having throw rugs at the top or bottom of the stairs. If you do have throw rugs, attach them to the floor with carpet tape.  Make sure that you have a light switch at the top of the stairs and the bottom of the stairs. If you do not have them, ask someone to add them for you. What else  can I do to help prevent falls?  Wear shoes that:  Do not have high heels.  Have rubber bottoms.  Are comfortable and fit you well.  Are closed at the toe. Do not wear sandals.  If you use a stepladder:  Make sure that it is fully opened. Do not climb a closed stepladder.  Make sure that both sides of the stepladder are locked into place.  Ask someone to hold it for you, if possible.  Clearly mark and make sure that you can see:  Any grab bars or handrails.  First and last steps.  Where the edge of each step is.  Use tools that help you move around (mobility aids) if they are needed. These include:  Canes.  Walkers.  Scooters.  Crutches.  Turn on the lights when you go into a dark area. Replace any light bulbs as soon as they burn out.  Set up your furniture so you have a clear path. Avoid moving your furniture around.  If any of your floors are uneven, fix them.  If there are any pets around you, be aware of where they are.  Review your medicines with your doctor. Some medicines can make you feel dizzy. This can increase your chance of falling. Ask your doctor what other things that you can do to help prevent falls. This information is not intended to replace advice given to you by your health care provider. Make sure you discuss any questions you have with your health care provider. Document Released: 09/24/2009 Document Revised: 05/05/2016 Document Reviewed: 01/02/2015  2017 Elsevier  Health Maintenance,  Female Introduction Adopting a healthy lifestyle and getting preventive care can go a long way to promote health and wellness. Talk with your health care provider about what schedule of regular examinations is right for you. This is a good chance for you to check in with your provider about disease prevention and staying healthy. In between checkups, there are plenty of things you can do on your own. Experts have done a lot of research about which lifestyle changes and preventive measures are most likely to keep you healthy. Ask your health care provider for more information. Weight and diet Eat a healthy diet  Be sure to include plenty of vegetables, fruits, low-fat dairy products, and lean protein.  Do not eat a lot of foods high in solid fats, added sugars, or salt.  Get regular exercise. This is one of the most important things you can do for your health.  Most adults should exercise for at least 150 minutes each week. The exercise should increase your heart rate and make you sweat (moderate-intensity exercise).  Most adults should also do strengthening exercises at least twice a week. This is in addition to the moderate-intensity exercise. Maintain a healthy weight  Body mass index (BMI) is a measurement that can be used to identify possible weight problems. It estimates body fat based on height and weight. Your health care provider can help determine your BMI and help you achieve or maintain a healthy weight.  For females 6 years of age and older:  A BMI below 18.5 is considered underweight.  A BMI of 18.5 to 24.9 is normal.  A BMI of 25 to 29.9 is considered overweight.  A BMI of 30 and above is considered obese. Watch levels of cholesterol and blood lipids  You should start having your blood tested for lipids and cholesterol at 79 years of age, then have this test every 5  years.  You may need to have your cholesterol levels checked more often if:  Your lipid or cholesterol  levels are high.  You are older than 79 years of age.  You are at high risk for heart disease. Cancer screening Lung Cancer  Lung cancer screening is recommended for adults 71-37 years old who are at high risk for lung cancer because of a history of smoking.  A yearly low-dose CT scan of the lungs is recommended for people who:  Currently smoke.  Have quit within the past 15 years.  Have at least a 30-pack-year history of smoking. A pack year is smoking an average of one pack of cigarettes a day for 1 year.  Yearly screening should continue until it has been 15 years since you quit.  Yearly screening should stop if you develop a health problem that would prevent you from having lung cancer treatment. Breast Cancer  Practice breast self-awareness. This means understanding how your breasts normally appear and feel.  It also means doing regular breast self-exams. Let your health care provider know about any changes, no matter how small.  If you are in your 20s or 30s, you should have a clinical breast exam (CBE) by a health care provider every 1-3 years as part of a regular health exam.  If you are 27 or older, have a CBE every year. Also consider having a breast X-ray (mammogram) every year.  If you have a family history of breast cancer, talk to your health care provider about genetic screening.  If you are at high risk for breast cancer, talk to your health care provider about having an MRI and a mammogram every year.  Breast cancer gene (BRCA) assessment is recommended for women who have family members with BRCA-related cancers. BRCA-related cancers include:  Breast.  Ovarian.  Tubal.  Peritoneal cancers.  Results of the assessment will determine the need for genetic counseling and BRCA1 and BRCA2 testing. Cervical Cancer  Your health care provider may recommend that you be screened regularly for cancer of the pelvic organs (ovaries, uterus, and vagina). This screening  involves a pelvic examination, including checking for microscopic changes to the surface of your cervix (Pap test). You may be encouraged to have this screening done every 3 years, beginning at age 64.  For women ages 41-65, health care providers may recommend pelvic exams and Pap testing every 3 years, or they may recommend the Pap and pelvic exam, combined with testing for human papilloma virus (HPV), every 5 years. Some types of HPV increase your risk of cervical cancer. Testing for HPV may also be done on women of any age with unclear Pap test results.  Other health care providers may not recommend any screening for nonpregnant women who are considered low risk for pelvic cancer and who do not have symptoms. Ask your health care provider if a screening pelvic exam is right for you.  If you have had past treatment for cervical cancer or a condition that could lead to cancer, you need Pap tests and screening for cancer for at least 20 years after your treatment. If Pap tests have been discontinued, your risk factors (such as having a new sexual partner) need to be reassessed to determine if screening should resume. Some women have medical problems that increase the chance of getting cervical cancer. In these cases, your health care provider may recommend more frequent screening and Pap tests. Colorectal Cancer  This type of cancer can be detected and  often prevented.  Routine colorectal cancer screening usually begins at 79 years of age and continues through 79 years of age.  Your health care provider may recommend screening at an earlier age if you have risk factors for colon cancer.  Your health care provider may also recommend using home test kits to check for hidden blood in the stool.  A small camera at the end of a tube can be used to examine your colon directly (sigmoidoscopy or colonoscopy). This is done to check for the earliest forms of colorectal cancer.  Routine screening usually  begins at age 58.  Direct examination of the colon should be repeated every 5-10 years through 79 years of age. However, you may need to be screened more often if early forms of precancerous polyps or small growths are found. Skin Cancer  Check your skin from head to toe regularly.  Tell your health care provider about any new moles or changes in moles, especially if there is a change in a mole's shape or color.  Also tell your health care provider if you have a mole that is larger than the size of a pencil eraser.  Always use sunscreen. Apply sunscreen liberally and repeatedly throughout the day.  Protect yourself by wearing long sleeves, pants, a wide-brimmed hat, and sunglasses whenever you are outside. Heart disease, diabetes, and high blood pressure  High blood pressure causes heart disease and increases the risk of stroke. High blood pressure is more likely to develop in:  People who have blood pressure in the high end of the normal range (130-139/85-89 mm Hg).  People who are overweight or obese.  People who are African American.  If you are 36-23 years of age, have your blood pressure checked every 3-5 years. If you are 21 years of age or older, have your blood pressure checked every year. You should have your blood pressure measured twice-once when you are at a hospital or clinic, and once when you are not at a hospital or clinic. Record the average of the two measurements. To check your blood pressure when you are not at a hospital or clinic, you can use:  An automated blood pressure machine at a pharmacy.  A home blood pressure monitor.  If you are between 26 years and 24 years old, ask your health care provider if you should take aspirin to prevent strokes.  Have regular diabetes screenings. This involves taking a blood sample to check your fasting blood sugar level.  If you are at a normal weight and have a low risk for diabetes, have this test once every three years  after 79 years of age.  If you are overweight and have a high risk for diabetes, consider being tested at a younger age or more often. Preventing infection Hepatitis B  If you have a higher risk for hepatitis B, you should be screened for this virus. You are considered at high risk for hepatitis B if:  You were born in a country where hepatitis B is common. Ask your health care provider which countries are considered high risk.  Your parents were born in a high-risk country, and you have not been immunized against hepatitis B (hepatitis B vaccine).  You have HIV or AIDS.  You use needles to inject street drugs.  You live with someone who has hepatitis B.  You have had sex with someone who has hepatitis B.  You get hemodialysis treatment.  You take certain medicines for conditions, including cancer,  organ transplantation, and autoimmune conditions. Hepatitis C  Blood testing is recommended for:  Everyone born from [birth year] through 1963.  Anyone with known risk factors for hepatitis C. Sexually transmitted infections (STIs)  You should be screened for sexually transmitted infections (STIs) including gonorrhea and chlamydia if:  You are sexually active and are younger than 79 years of age.  You are older than 79 years of age and your health care provider tells you that you are at risk for this type of infection.  Your sexual activity has changed since you were last screened and you are at an increased risk for chlamydia or gonorrhea. Ask your health care provider if you are at risk.  If you do not have HIV, but are at risk, it may be recommended that you take a prescription medicine daily to prevent HIV infection. This is called pre-exposure prophylaxis (PrEP). You are considered at risk if:  You are sexually active and do not regularly use condoms or know the HIV status of your partner(s).  You take drugs by injection.  You are sexually active with a partner who has HIV. Talk  with your health care provider about whether you are at high risk of being infected with HIV. If you choose to begin PrEP, you should first be tested for HIV. You should then be tested every 3 months for as long as you are taking PrEP. Pregnancy  If you are premenopausal and you may become pregnant, ask your health care provider about preconception counseling.  If you may become pregnant, take 400 to 800 micrograms (mcg) of folic acid every day.  If you want to prevent pregnancy, talk to your health care provider about birth control (contraception). Osteoporosis and menopause  Osteoporosis is a disease in which the bones lose minerals and strength with aging. This can result in serious bone fractures. Your risk for osteoporosis can be identified using a bone density scan.  If you are 73 years of age or older, or if you are at risk for osteoporosis and fractures, ask your health care provider if you should be screened.  Ask your health care provider whether you should take a calcium or vitamin D supplement to lower your risk for osteoporosis.  Menopause may have certain physical symptoms and risks.  Hormone replacement therapy may reduce some of these symptoms and risks. Talk to your health care provider about whether hormone replacement therapy is right for you. Follow these instructions at home:  Schedule regular health, dental, and eye exams.  Stay current with your immunizations.  Do not use any tobacco products including cigarettes, chewing tobacco, or electronic cigarettes.  If you are pregnant, do not drink alcohol.  If you are breastfeeding, limit how much and how often you drink alcohol.  Limit alcohol intake to no more than 1 drink per day for nonpregnant women. One drink equals 12 ounces of beer, 5 ounces of wine, or 1 ounces of hard liquor.  Do not use street drugs.  Do not share needles.  Ask your health care provider for help if you need support or information  about quitting drugs.  Tell your health care provider if you often feel depressed.  Tell your health care provider if you have ever been abused or do not feel safe at home. This information is not intended to replace advice given to you by your health care provider. Make sure you discuss any questions you have with your health care provider. Document Released: 06/13/2011 Document Revised:  05/05/2016 Document Reviewed: 09/01/2015  2017 Elsevier   Mammogram A mammogram is an X-ray of the breasts that is done to check for abnormal changes. This procedure can screen for and detect any changes that may suggest breast cancer. A mammogram can also identify other changes and variations in the breast, such as:  Inflammation of the breast tissue (mastitis).  An infected area that contains a collection of pus (abscess).  A fluid-filled sac (cyst).  Fibrocystic changes. This is when breast tissue becomes denser, which can make the tissue feel rope-like or uneven under the skin.  Tumors that are not cancerous (benign). Tell a health care provider about:  Any allergies you have.  If you have breast implants.  If you have had previous breast disease, biopsy, or surgery.  If you are breastfeeding.  Any possibility that you could be pregnant, if this applies.  If you are younger than age 71.  If you have a family history of breast cancer. What are the risks? Generally, this is a safe procedure. However, problems may occur, including:  Exposure to radiation. Radiation levels are very low with this test.  The results being misinterpreted.  The need for further tests.  The inability of the mammogram to detect certain cancers. What happens before the procedure?  Schedule your test about 1-2 weeks after your menstrual period. This is usually when your breasts are the least tender.  If you have had a mammogram done at a different facility in the past, get the mammogram X-rays or have  them sent to your current exam facility in order to compare them.  Wash your breasts and under your arms the day of the test.  Do not wear deodorants, perfumes, lotions, or powders anywhere on your body on the day of the test.  Remove any jewelry from your neck.  Wear clothes that you can change into and out of easily. What happens during the procedure?  You will undress from the waist up and put on a gown.  You will stand in front of the X-ray machine.  Each breast will be placed between two plastic or glass plates. The plates will compress your breast for a few seconds. Try to stay as relaxed as possible during the procedure. This does not cause any harm to your breasts and any discomfort you feel will be very brief.  X-rays will be taken from different angles of each breast. The procedure may vary among health care providers and hospitals. What happens after the procedure?  The mammogram will be examined by a specialist (radiologist).  You may need to repeat certain parts of the test, depending on the quality of the images. This is commonly done if the radiologist needs a better view of the breast tissue.  Ask when your test results will be ready. Make sure you get your test results.  You may resume your normal activities. This information is not intended to replace advice given to you by your health care provider. Make sure you discuss any questions you have with your health care provider. Document Released: 11/25/2000 Document Revised: 05/02/2016 Document Reviewed: 02/06/2015 Elsevier Interactive Patient Education  2017 [employer].

## 2017-01-10 NOTE — Progress Notes (Signed)
Subjective:   [name] is a 81 y.o. female who presents for Medicare Annual (Subsequent) preventive examination.  The Patient was informed that the wellness visit is to identify future health risk and educate and initiate measures that can reduce risk for increased disease through the lifespan.    NO ROS; Medicare Wellness Visit  C/o anxiety in the am; has apt with [name] in Feb but is also seeing counselor w [facility]. Recommended medication adjustment and agreed to see [name] today.   C/o of frequent UTI Taking Azo otc   Describes health as good, fair or great? Still having some anxiety in the am Goes through the evening but wakes up with anxiety Have been seeing counselor; dr. Kelton Pillar Discussed making an apt with [name]  (depressoin; HTN; hyperlipidemia)  (HX father HD; sister cancer)   The following written screening schedule of preventive measures were reviewed with assessment and plan made per below and patient instructions:  Smoking history reviewed - former smoker; quit 66; Rare smoking use; pack years .1    Use of Smokeless tobacco never used / rare  Spouse smoked x 35 years in the home;  Dental fup / does get consistent dental care ETOH no  RISK FACTORS Regular [facility]- [facility]ics class Started getting UTI'; will sign up for other exercise' Discussed the silver sneaker program  Walks in the neighborhood; walks with neighbor    Diet; Minimal coffee drinker Has cereal for breakfast Sandwich for lunch Lately more sandwich; eats with dtr's and she cooks Buys prepared meals  Eats a lot of oranges  Makes salads   Barriers noted and given weight loss strategies as indicated  Lipids reviewed and reducing cholesterol discussed  Chol 216; HDL 67; LDL 126; Trig 112 A1c is 5.7    Fall risk: presented mobile; get up and go WNL  No falls;  Upstairs in home; has a stair lift if needed Mobility of Functional changes  this year? No  Multilevel home   Safety at home and  Community reviewed; Good neighbors  wears sunscreen if in the sun; if she is working in the yard  Seen a Paediatric nurse last  [place] firearms in a safe place if they exist / no  Safe driving recommendations for older adults Motor vehicle accidents assessed in the last year / no   Depression Screen Recent hx with the relocation of spouse at [facility] To let [name]  Know if she needs to increase the lexapro Apt made today Declined depression assessment as she knows what her issues are and is in treatment  PhQ 2: waived today but seeing [name]   Activities of Daily Living - See functional screen   Cognitive testing; Ad8 score; 0 or less than 2  MMSE deferred or completed if AD8 + 2 issues  Advanced Directives reviewed for completion; Advanced Directives: Has Living Will, HCPOA: [name] [phone]  Patient Care Team: [name], DO as PCP - General (Family Medicine) Dr. Philis Pique OB GYN    Preventives screens reviewed Colonoscopy 07/2009 Mammogram 04/2010; GYN does mammograms; every year  Dexa; will ask Dr. Philis Pique for Dexa report or may consider dexa for baseline     Immunization History  Administered Date(s) Administered  . Influenza Split 08/12/2012  . Influenza Whole 09/12/2009  . Influenza, High Dose Seasonal PF 08/24/2013, 09/04/2015, 08/29/2016  . Influenza-Unspecified 08/12/2012, 09/28/2014  . Pneumococcal Conjugate-13 10/31/2014  .  Pneumococcal-Unspecified 08/24/2013  . Td 05/14/2007  . Tdap 10/31/2014  . Zoster 09/24/2013   Required Immunizations needed today  Screening test up to date or reviewed for plan of completion There are no preventive care reminders to display for this patient. Cardiac Risk Factors include: advanced age (>49men, >77 women);dyslipidemia;hypertensionwill bring a copy of mammogram and dexa      Objective:     Vitals: BP (!) 164/80   Pulse 74   Ht 5' 3.5"  (1.613 m)   Wt 136 lb 3 oz (61.8 kg)   SpO2 98%   BMI 23.75 kg/m   Body mass index is 23.75 kg/m.  Will recheck BP when she see's [name]   Tobacco History  Smoking Status  . Former Smoker  . Packs/day: 0.10  . Years: 1.00  . Types: Cigarettes  . Quit date: 12/12/1962  Smokeless Tobacco  . Never Used    Comment: smoked for 3 months in college      Counseling given: Yes   Past Medical History:  Diagnosis Date  . Allergic rhinitis, cause unspecified   . Contact dermatitis 03/16/2013  . Generalized anxiety disorder 02/19/2013   has seen [name], [name] and now [name] for counseling  . Hemorrhoids, external   . Hot flashes   . Hypertension   . Irritable bowel syndrome   . MDD (major depressive disorder) 03/18/2016  . Other and unspecified hyperlipidemia   . Palpitations 02/19/2013   On-set of symptoms March '14. EKG with NSR    Past Surgical History:  Procedure Laterality Date  . benign moles removed    . CATARACT EXTRACTION W/ INTRAOCULAR LENS IMPLANT Bilateral    right - Nov '11, left - Nov '13 [place]  . HEMORRHOID SURGERY    . sebaceous cyst excised     Family History  Problem Relation Age of Onset  . Heart disease Father   . Cancer Sister     breast  . Heart disease Other   . Heart disease Other   . Heart disease Other   . Cancer Sister     breast  . Hypothyroidism Sister   . Asthma Sister   . Rheum arthritis Paternal Grandmother   . Rheum arthritis Daughter    History  Sexual Activity  . Sexual activity: Yes  . Partners: Male    Outpatient Encounter Prescriptions as of 01/10/2017  Medication Sig  . EPINEPHrine (EPIPEN) 0.3 mg/0.3 mL SOAJ injection Inject 0.3 mg into the muscle once as needed (for allergic reaction).  [name] escitalopram (LEXAPRO) 10 MG tablet TAKE 1 TABLET (10 MG TOTAL) BY MOUTH DAILY.  [name] estradiol (ESTRACE) 1 MG tablet Take 0.5 mg by mouth at bedtime.   . fluticasone (FLONASE) 50 MCG/ACT nasal spray Place 2 sprays  into both nostrils daily.  . hydrochlorothiazide (HYDRODIURIL) 12.5 MG tablet Take 0.5 tablets (6.25 mg total) by mouth daily.  . Multiple Vitamin (MULTIVITAMIN) capsule Take 1 capsule by mouth daily.  [name] OVER THE COUNTER MEDICATION Take 1 packet by mouth daily. Dissolve one packet in a glass of water each morning. Emergen-C  . progesterone (PROMETRIUM) 100 MG capsule Take 100 mg by mouth at bedtime.   . vitamin B-12 (CYANOCOBALAMIN) 1000 MCG tablet Take 1,000 mcg by mouth daily.   No facility-administered encounter medications on file as of 01/10/2017.     Activities of Daily Living In your present state of health, do you have any difficulty performing the following activities: 01/10/2017  Hearing?  N  Vision? N  Difficulty concentrating or making decisions? N  Walking or climbing stairs? N  Dressing or bathing? N  Doing errands, shopping? N  Preparing Food and eating ? N  Using the Toilet? N  In the past six months, have you accidently leaked urine? N  Do you have problems with loss of bowel control? N  Managing your Medications? N  Managing your Finances? N  Housekeeping or managing your Housekeeping? N  Some recent data might be hidden    Patient Care Team: [name], DO as PCP - General (Family Medicine)    Assessment:     Exercise Activities and Dietary recommendations Current Exercise Habits: Home exercise routine;Structured exercise class, Time (Minutes): 60, Frequency (Times/Week): 3, Weekly Exercise (Minutes/Week): 180, Intensity: Moderate  Goals    . patient          Getting up happy and feeling good       Fall Risk Fall Risk  01/10/2017 12/24/2015 12/24/2015 05/07/2014 03/10/2014  Falls in the past year? No No No No No   Depression Screen PHQ 2/9 Scores 01/10/2017 12/24/2015 12/24/2015 05/07/2014  PHQ - 2 Score 0 1 1 0     Cognitive Function MMSE - Mini Mental State Exam 01/10/2017  Not completed: (No Data)     Hearing Screening   125Hz  250Hz  500Hz  1000Hz   2000Hz  3000Hz  4000Hz  6000Hz  8000Hz   Right ear:       100    Left ear:     100      Comments: No hearing issue    Vision Screening Comments: Vision checks OU lens replacement in the past  The eye doctor just last month told her had needed to take drops for glaucoma [facility];   Dr Alanda Slim        Immunization History  Administered Date(s) Administered  . Influenza Split 08/12/2012  . Influenza Whole 09/12/2009  . Influenza, High Dose Seasonal PF 08/24/2013, 09/04/2015, 08/29/2016  . Influenza-Unspecified 08/12/2012, 09/28/2014  . Pneumococcal Conjugate-13 10/31/2014  . Pneumococcal-Unspecified 08/24/2013  . Td 05/14/2007  . Tdap 10/31/2014  . Zoster 09/24/2013   Screening Tests Health Maintenance  Topic Date Due  . DEXA SCAN  07/11/2017 (Originally 01/17/2003)  . MAMMOGRAM  12/11/2017 (Originally 11/28/2016)  . TETANUS/TDAP  10/31/2024  . INFLUENZA VACCINE  Addressed  . ZOSTAVAX  Completed  . PNA vac Low Risk Adult  Completed      Plan:    PCP Notes  Health Maintenance Only needed Dexa and will bring a copy from GYN office or may repeat  STates she was told she really didn't need add'l screening by GYN   Mammogram every year when she goes for her GYN exam; around December   Abnormal Screens c/o of anxiety; to see [name]   Referrals  none Patient concerns; anxiety in the am and frequent UTI   Nurse Concerns; none  Next PCP apt scheduled fup today    During the course of the visit the patient was educated and counseled about the following appropriate screening and preventive services:   Vaccines to include Pneumoccal, Influenza, Hepatitis B, Td, Zostavax, HCV  Electrocardiogram  Cardiovascular Disease/ neg  Colorectal cancer screening 07/2009; not scheduled to repeat  Bone density screening/ to bring report from GYN  Diabetes screening neg  Glaucoma screening / just dx with Glaucoma  Mammography/every year  Nutrition counseling /  adequate; BMI 25;  Eating out  Patient Instructions (the written plan) was  given to the patient.   Wynetta Fines, RN  01/10/2017

## 2017-01-11 ENCOUNTER — Telehealth: Payer: Self-pay | Admitting: Family Medicine

## 2017-01-11 NOTE — Telephone Encounter (Signed)
Pt states she got a call last night and advised to recheck bp in one week with labs.  Do you need to see the pt? Do the labs need to be fasting? Please advise. Thank you.

## 2017-01-11 NOTE — Progress Notes (Signed)
I called pt to have her come back for recheck BP in 2 weeks.  Colin Benton R., DO

## 2017-01-11 NOTE — Telephone Encounter (Signed)
Please schedule 15 minute with [name]. Does not need to be fasting. Thanks.

## 2017-01-11 NOTE — Telephone Encounter (Signed)
Pt has been scheduled.  °

## 2017-01-15 NOTE — Progress Notes (Signed)
HPI:   Follow up HTN: -elevated last visit -will also get labs today (BMP, CBC) -meds: hctz -extreme anxiety regarding meds (has not tolerated lisinopril, norvasc and combos) -denies: CP, SOB, DOE, HAs  New issue [place] sh[place]: -bumped on vacuum cleaner a few days ago -wants to check -no drainage or sig pain  ROS: See pertinent positives and negatives per HPI.  Past Medical History:  Diagnosis Date  . Allergic rhinitis, cause unspecified   . Contact dermatitis 03/16/2013  . Generalized anxiety disorder 02/19/2013   has seen [name], [name] and now [name] for counseling  . Hemorrhoids, external   . Hot flashes   . Hypertension   . Irritable bowel syndrome   . MDD (major depressive disorder) 03/18/2016  . Other and unspecified hyperlipidemia   . Palpitations 02/19/2013   On-set of symptoms March '14. EKG with NSR     Past Surgical History:  Procedure Laterality Date  . benign moles removed    . CATARACT EXTRACTION W/ INTRAOCULAR LENS IMPLANT Bilateral    right - Nov '11, left - Nov '13 [place]  . HEMORRHOID SURGERY    . sebaceous cyst excised      Family History  Problem Relation Age of Onset  . Heart disease Father   . Cancer Sister     breast  . Heart disease Other   . Heart disease Other   . Heart disease Other   . Cancer Sister     breast  . Hypothyroidism Sister   . Asthma Sister   . Rheum arthritis Paternal Grandmother   . Rheum arthritis Daughter     Social History   Social History  . Marital status: Married    Spouse name: N/A  . Number of children: 4  . Years of education: N/A   Occupational History  . retired    Social History Main Topics  . Smoking status: Former Smoker    Packs/day: 0.10    Years: 1.00    Types: Cigarettes    Quit date: 12/12/1962  . Smokeless tobacco: Never Used     Comment: smoked for 3 months in college   . Alcohol use Yes     Comment: rarely  . Drug use: No  . Sexual activity: Yes    Partners:  Male   Other Topics Concern  . None   Social History Narrative   College grad. Married '61. 4 daughters, 2 grandchildren. 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   Advanced Directives: Has Living Will, HCPOA: Shola Gochez [phone]        Current Outpatient Prescriptions:  .  EPINEPHrine (EPIPEN) 0.3 mg/0.3 mL SOAJ injection, Inject 0.3 mg into the muscle once as needed (for allergic reaction)., Disp: , Rfl:  .  escitalopram (LEXAPRO) 10 MG tablet, TAKE 1 TABLET (10 MG TOTAL) BY MOUTH DAILY., Disp: 30 tablet, Rfl: 3 .  estradiol (ESTRACE) 1 MG tablet, Take 0.5 mg by mouth at bedtime. , Disp: , Rfl:  .  fluticasone (FLONASE) 50 MCG/ACT nasal spray, Place 2 sprays into both nostrils daily., Disp: 16 g, Rfl: 6 .  hydrochlorothiazide (HYDRODIURIL) 12.5 MG tablet, Take 0.5 tablets (6.25 mg total) by mouth daily., Disp: 45 tablet, Rfl: 3 .  Multiple Vitamin (MULTIVITAMIN) capsule, Take 1 capsule by mouth daily., Disp: , Rfl:  .  OVER THE COUNTER MEDICATION, Take 1 packet by mouth daily. Dissolve one packet in a glass of water each morning. Emergen-C, Disp: , Rfl:  .  progesterone (PROMETRIUM) 100 MG capsule, Take 100 mg by mouth at bedtime. , Disp: , Rfl:  .  vitamin B-12 (CYANOCOBALAMIN) 1000 MCG tablet, Take 1,000 mcg by mouth daily., Disp: , Rfl:   EXAM:  Vitals:   01/16/17 1013  BP: (!) 100/58  Pulse: (!) 51  Temp: 97.4 F (36.3 C)    Body mass index is 23.52 kg/m.  GENERAL: vitals reviewed and listed above, alert, oriented, appears well hydrated and in no acute distress  HEENT: atraumatic, conjunttiva clear, no obvious abnormalities on inspection of external nose and ears  NECK: no obvious masses on inspection  LUNGS: clear to auscultation bilaterally, no wheezes, rales or rhonchi, good air movement  CV: HRRR, no peripheral edema  SKIN:3 cm lack L shin, healing well with scab  MS: moves all extremities without noticeable abnormality  PSYCH: pleasant and  cooperative, no obvious depression or anxiety  ASSESSMENT AND PLAN:  Discussed the following assessment and plan:  Secondary hypertension - Plan: Basic metabolic panel, CBC (no diff)  Laceration of lower extremity, unspecified laterality, initial encounter  -BP much better, actually on low end today -cont current meds -labs today -wound care recs to soften scab - prevent pruritis -Patient advised to return or notify a doctor immediately if symptoms worsen or persist or new concerns arise.  Patient Instructions  BEFORE YOU LEAVE: -follow up: 3 months -labs  Massage antibiotic ointment on scab on leg 1-2 times daily.  We have ordered labs or studies at this visit. It can take up to 1-2 weeks for results and processing. IF results require follow up or explanation, we will call you with instructions. Clinically stable results will be released to your [facility]. If you have not heard from [place] or cannot find your results in [facility] in 2 weeks please contact our office at [phone].   If you are not yet signed up for [facility], please SIGN UP TODAY. We now offer online scheduling, same day appointments and extended hours. WHEN YOU DON'T FEEL YOUR BEST.Marland KitchenMarland KitchenWE ARE HERE TO HELP.            Colin Benton R., DO

## 2017-01-16 ENCOUNTER — Encounter: Payer: Self-pay | Admitting: Family Medicine

## 2017-01-16 ENCOUNTER — Ambulatory Visit (INDEPENDENT_AMBULATORY_CARE_PROVIDER_SITE_OTHER): Payer: Medicare Other | Admitting: Family Medicine

## 2017-01-16 VITALS — BP 100/58 | HR 51 | Temp 97.4°F | Ht 63.5 in | Wt 134.9 lb

## 2017-01-16 DIAGNOSIS — I159 Secondary hypertension, unspecified: Secondary | ICD-10-CM

## 2017-01-16 DIAGNOSIS — S81819A Laceration without foreign body, unspecified lower leg, initial encounter: Secondary | ICD-10-CM

## 2017-01-16 LAB — CBC
HCT: 41.1 % (ref 36.0–46.0)
Hemoglobin: 13.9 g/dL (ref 12.0–15.0)
MCHC: 33.8 g/dL (ref 30.0–36.0)
MCV: 92.7 fl (ref 78.0–100.0)
PLATELETS: 267 10*3/uL (ref 150.0–400.0)
RBC: 4.44 Mil/uL (ref 3.87–5.11)
RDW: 13.2 % (ref 11.5–15.5)
WBC: 5.4 10*3/uL (ref 4.0–10.5)

## 2017-01-16 LAB — BASIC METABOLIC PANEL
BUN: 14 mg/dL (ref 6–23)
CO2: 30 meq/L (ref 19–32)
CREATININE: 0.77 mg/dL (ref 0.40–1.20)
Calcium: 9.1 mg/dL (ref 8.4–10.5)
Chloride: 104 mEq/L (ref 96–112)
GFR: 76.86 mL/min (ref >60.00)
Glucose, Bld: 93 mg/dL (ref 70–99)
Potassium: 4.4 mEq/L (ref 3.5–5.1)
SODIUM: 139 meq/L (ref 135–145)

## 2017-01-16 NOTE — Patient Instructions (Signed)
BEFORE YOU LEAVE: -follow up: 3 months -labs  Massage antibiotic ointment on scab on leg 1-2 times daily.  We have ordered labs or studies at this visit. It can take up to 1-2 weeks for results and processing. IF results require follow up or explanation, we will call you with instructions. Clinically stable results will be released to your [facility]. If you have not heard from [place] or cannot find your results in [facility] in 2 weeks please contact our office at [phone].   If you are not yet signed up for [facility], please SIGN UP TODAY. We now offer online scheduling, same day appointments and extended hours. WHEN YOU DON'T FEEL YOUR BEST.Marland KitchenMarland KitchenWE ARE HERE TO HELP.

## 2017-01-16 NOTE — Progress Notes (Signed)
Pre visit review using our clinic review tool, if applicable. No additional management support is needed unless otherwise documented below in the visit note. 

## 2017-01-23 ENCOUNTER — Ambulatory Visit: Payer: Medicare Other | Admitting: Licensed Clinical Social Worker

## 2017-02-06 ENCOUNTER — Ambulatory Visit (INDEPENDENT_AMBULATORY_CARE_PROVIDER_SITE_OTHER): Payer: Medicare Other | Admitting: Psychology

## 2017-02-06 DIAGNOSIS — F411 Generalized anxiety disorder: Secondary | ICD-10-CM | POA: Diagnosis not present

## 2017-02-21 ENCOUNTER — Ambulatory Visit: Payer: Medicare Other | Admitting: Psychology

## 2017-02-27 ENCOUNTER — Ambulatory Visit: Payer: Self-pay | Admitting: Family Medicine

## 2017-03-04 ENCOUNTER — Other Ambulatory Visit: Payer: Self-pay | Admitting: Family Medicine

## 2017-03-04 DIAGNOSIS — I10 Essential (primary) hypertension: Secondary | ICD-10-CM

## 2017-03-07 ENCOUNTER — Ambulatory Visit (INDEPENDENT_AMBULATORY_CARE_PROVIDER_SITE_OTHER): Payer: Medicare Other | Admitting: Psychology

## 2017-03-07 DIAGNOSIS — F411 Generalized anxiety disorder: Secondary | ICD-10-CM

## 2017-04-04 ENCOUNTER — Ambulatory Visit (INDEPENDENT_AMBULATORY_CARE_PROVIDER_SITE_OTHER): Payer: Medicare Other | Admitting: Psychology

## 2017-04-04 DIAGNOSIS — F411 Generalized anxiety disorder: Secondary | ICD-10-CM | POA: Diagnosis not present

## 2017-04-18 ENCOUNTER — Encounter: Payer: Self-pay | Admitting: Family Medicine

## 2017-04-18 ENCOUNTER — Ambulatory Visit (INDEPENDENT_AMBULATORY_CARE_PROVIDER_SITE_OTHER): Payer: Medicare Other | Admitting: Family Medicine

## 2017-04-18 VITALS — BP 120/60 | HR 48 | Temp 97.9°F | Ht 63.5 in | Wt 137.5 lb

## 2017-04-18 DIAGNOSIS — F331 Major depressive disorder, recurrent, moderate: Secondary | ICD-10-CM | POA: Diagnosis not present

## 2017-04-18 DIAGNOSIS — I10 Essential (primary) hypertension: Secondary | ICD-10-CM

## 2017-04-18 DIAGNOSIS — E785 Hyperlipidemia, unspecified: Secondary | ICD-10-CM

## 2017-04-18 DIAGNOSIS — F411 Generalized anxiety disorder: Secondary | ICD-10-CM

## 2017-04-18 NOTE — Patient Instructions (Signed)
BEFORE YOU LEAVE: -follow up: 1) follow up with [name] 3-4 months 2)Medicare exam with Susan Jan 2019  Continue current medications  Continue counseling with [name].  Please increase exercise to 150 minutes per week.  WE NOW OFFER   [facility] FAST TRACK!!!  SAME DAY Appointments for ACUTE CARE  Such as: Sprains, Injuries, cuts, abrasions, rashes, muscle pain, joint pain, back pain Colds, flu, sore throats, headache, allergies, cough, fever  Ear pain, sinus and eye infections Abdominal pain, nausea, vomiting, diarrhea, upset stomach Animal/insect bites  3 Easy Ways to Schedule: Walk-In Scheduling Call in scheduling Mychart Sign-up: [email or web address]

## 2017-04-18 NOTE — Progress Notes (Signed)
Pre visit review using our clinic review tool, if applicable. No additional management support is needed unless otherwise documented below in the visit note. 

## 2017-04-18 NOTE — Progress Notes (Signed)
HPI:  KAYLIEGH BOYERS is a pleasant 81 yo with a PMH significant for Anxiety, depression, hypertension, Hyperlipidemia here for follow up. She reports significant improvement in anxiety and depression despite ongoing stress caring for husband with dementia (husband now living at [facility]). She continues her lexapro and CBT with [name]. No SI, thoughts of self harm or significant depression at this point. No regular exercise. Feels diet is good. No CP, SOB, DOE.  ROS: See pertinent positives and negatives per HPI.  Past Medical History:  Diagnosis Date  . Allergic rhinitis, cause unspecified   . Contact dermatitis 03/16/2013  . Generalized anxiety disorder 02/19/2013   has seen [name], [name] and now [name] for counseling  . Hemorrhoids, external   . Hot flashes   . Hypertension   . Irritable bowel syndrome   . MDD (major depressive disorder) 03/18/2016  . Other and unspecified hyperlipidemia   . Palpitations 02/19/2013   On-set of symptoms March '14. EKG with NSR     Past Surgical History:  Procedure Laterality Date  . benign moles removed    . CATARACT EXTRACTION W/ INTRAOCULAR LENS IMPLANT Bilateral    right - Nov '11, left - Nov '13 [place]  . HEMORRHOID SURGERY    . sebaceous cyst excised      Family History  Problem Relation Age of Onset  . Heart disease Father   . Cancer Sister     breast  . Heart disease Other   . Heart disease Other   . Heart disease Other   . Cancer Sister     breast  . Hypothyroidism Sister   . Asthma Sister   . Rheum arthritis Paternal Grandmother   . Rheum arthritis Daughter     Social History   Social History  . Marital status: Married    Spouse name: N/A  . Number of children: 4  . Years of education: N/A   Occupational History  . retired    Social History Main Topics  . Smoking status: Former Smoker    Packs/day: 0.10    Years: 1.00    Types: Cigarettes    Quit date: 12/12/1962  . Smokeless tobacco:  Never Used     Comment: smoked for 3 months in college   . Alcohol use Yes     Comment: rarely  . Drug use: No  . Sexual activity: Yes    Partners: Male   Other Topics Concern  . None   Social History Narrative   College grad. Married '61. 4 daughters, 2 grandchildren. 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   Advanced Directives: Has Living Will, HCPOA: Calliope Delangel [phone]        Current Outpatient Prescriptions:  .  EPINEPHrine (EPIPEN) 0.3 mg/0.3 mL SOAJ injection, Inject 0.3 mg into the muscle once as needed (for allergic reaction)., Disp: , Rfl:  .  escitalopram (LEXAPRO) 10 MG tablet, TAKE 1 TABLET (10 MG TOTAL) BY MOUTH DAILY., Disp: 30 tablet, Rfl: 3 .  estradiol (ESTRACE) 1 MG tablet, Take 0.5 mg by mouth at bedtime. , Disp: , Rfl:  .  fluticasone (FLONASE) 50 MCG/ACT nasal spray, Place 2 sprays into both nostrils daily., Disp: 16 g, Rfl: 6 .  hydrochlorothiazide (HYDRODIURIL) 12.5 MG tablet, TAKE 1/2 TABLET BY MOUTH EVERY DAY, Disp: 45 tablet, Rfl: 1 .  Multiple Vitamin (MULTIVITAMIN) capsule, Take 1 capsule by mouth daily., Disp: , Rfl:  .  OVER THE COUNTER MEDICATION, Take 1  packet by mouth daily. Dissolve one packet in a glass of water each morning. Emergen-C, Disp: , Rfl:  .  progesterone (PROMETRIUM) 100 MG capsule, Take 100 mg by mouth at bedtime. , Disp: , Rfl:  .  vitamin B-12 (CYANOCOBALAMIN) 1000 MCG tablet, Take 1,000 mcg by mouth daily., Disp: , Rfl:   EXAM:  Vitals:   04/18/17 1005  BP: 120/60  Pulse: (!) 48  Temp: 97.9 F (36.6 C)    Body mass index is 23.97 kg/m.  GENERAL: vitals reviewed and listed above, alert, oriented, appears well hydrated and in no acute distress  HEENT: atraumatic, conjunttiva clear, no obvious abnormalities on inspection of external nose and ears  NECK: no obvious masses on inspection  LUNGS: clear to auscultation bilaterally, no wheezes, rales or rhonchi, good air movement  CV: HRRR, no peripheral  edema  MS: moves all extremities without noticeable abnormality  PSYCH: pleasant and cooperative, no obvious depression or anxiety  ASSESSMENT AND PLAN:  Discussed the following assessment and plan:  Moderate episode of recurrent major depressive disorder (HCC)  Generalized anxiety disorder  Essential hypertension  Hyperlipidemia, unspecified hyperlipidemia type  -she asked about stopping lexapro - we agreed might be better to continue for now -advised increased exercise as will help lipids, HTN and mood and overall health -healthy diet advised -continue current meds -opted to skip labs today -Patient advised to return or notify a doctor immediately if symptoms worsen or persist or new concerns arise.  Patient Instructions  BEFORE YOU LEAVE: -follow up: 1) follow up with [name] 3-4 months 2)Medicare exam with Susan Jan 2019  Continue current medications  Continue counseling with [name].  Please increase exercise to 150 minutes per week.  WE NOW OFFER   [facility] FAST TRACK!!!  SAME DAY Appointments for ACUTE CARE  Such as: Sprains, Injuries, cuts, abrasions, rashes, muscle pain, joint pain, back pain Colds, flu, sore throats, headache, allergies, cough, fever  Ear pain, sinus and eye infections Abdominal pain, nausea, vomiting, diarrhea, upset stomach Animal/insect bites  3 Easy Ways to Schedule: Walk-In Scheduling Call in scheduling Mychart Sign-up: [email or web address]            Colin Benton R., DO

## 2017-04-20 ENCOUNTER — Telehealth: Payer: Self-pay | Admitting: Family Medicine

## 2017-04-20 NOTE — Telephone Encounter (Signed)
I left a message for the pt to return my call. 

## 2017-04-20 NOTE — Telephone Encounter (Signed)
[name],   Can you call patient? Not sure what she needs from [place]? Schedulers for [name] are separate from our schedulers - provide number if needed. Thanks!

## 2017-04-20 NOTE — Telephone Encounter (Signed)
Pt would like to continue to have access to Dr Glennon Hamilton. Pt states Dr Maudie Mercury will understand.

## 2017-04-28 ENCOUNTER — Ambulatory Visit (INDEPENDENT_AMBULATORY_CARE_PROVIDER_SITE_OTHER): Payer: Medicare Other | Admitting: Family Medicine

## 2017-04-28 ENCOUNTER — Encounter: Payer: Self-pay | Admitting: Family Medicine

## 2017-04-28 VITALS — BP 136/80 | HR 59 | Temp 97.7°F | Ht 63.5 in | Wt 136.0 lb

## 2017-04-28 DIAGNOSIS — M545 Low back pain, unspecified: Secondary | ICD-10-CM

## 2017-04-28 DIAGNOSIS — R350 Frequency of micturition: Secondary | ICD-10-CM

## 2017-04-28 DIAGNOSIS — R197 Diarrhea, unspecified: Secondary | ICD-10-CM | POA: Diagnosis not present

## 2017-04-28 LAB — POCT URINALYSIS DIPSTICK
Bilirubin, UA: NEGATIVE
Blood, UA: NEGATIVE
GLUCOSE UA: NEGATIVE
KETONES UA: NEGATIVE
LEUKOCYTES UA: NEGATIVE
Nitrite, UA: NEGATIVE
Protein, UA: NEGATIVE
Urobilinogen, UA: 0.2 E.U./dL
pH, UA: 6 (ref 5.0–8.0)

## 2017-04-28 NOTE — Progress Notes (Signed)
HPI:  Acute visit for concern for UTI: -has been lifting and moving husband helping with his care and developed R low back pain last night -today has noticed a little urinary frequency, tylenol helped th back pain -no fevers, malaise, radiation, weakness, numbness, bowel or bladder incontinence, hematuria, upper flank pain, diarrhea, NV -she did have one loose bowel this morning, no melena, hematochezia, diarrhea, constipation  ROS: See pertinent positives and negatives per HPI.  Past Medical History:  Diagnosis Date  . Allergic rhinitis, cause unspecified   . Contact dermatitis 03/16/2013  . Generalized anxiety disorder 02/19/2013   has seen [name], [name] and now [name] for counseling  . Hemorrhoids, external   . Hot flashes   . Hypertension   . Irritable bowel syndrome   . MDD (major depressive disorder) 03/18/2016  . Other and unspecified hyperlipidemia   . Palpitations 02/19/2013   On-set of symptoms March '14. EKG with NSR     Past Surgical History:  Procedure Laterality Date  . benign moles removed    . CATARACT EXTRACTION W/ INTRAOCULAR LENS IMPLANT Bilateral    right - Nov '11, left - Nov '13 [place]  . HEMORRHOID SURGERY    . sebaceous cyst excised      Family History  Problem Relation Age of Onset  . Heart disease Father   . Cancer Sister        breast  . Heart disease Other   . Heart disease Other   . Heart disease Other   . Cancer Sister        breast  . Hypothyroidism Sister   . Asthma Sister   . Rheum arthritis Paternal Grandmother   . Rheum arthritis Daughter     Social History   Social History  . Marital status: Married    Spouse name: N/A  . Number of children: 4  . Years of education: N/A   Occupational History  . retired    Social History Main Topics  . Smoking status: Former Smoker    Packs/day: 0.10    Years: 1.00    Types: Cigarettes    Quit date: 12/12/1962  . Smokeless tobacco: Never Used     Comment:  smoked for 3 months in college   . Alcohol use Yes     Comment: rarely  . Drug use: No  . Sexual activity: Yes    Partners: Male   Other Topics Concern  . None   Social History Narrative   College grad. Married '61. 4 daughters, 2 grandchildren. 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   Advanced Directives: Has Living Will, HCPOA: Elzina Devera [phone]        Current Outpatient Prescriptions:  .  Acetaminophen (TYLENOL PO), Take by mouth as needed., Disp: , Rfl:  .  AZO-CRANBERRY PO, Take by mouth as needed., Disp: , Rfl:  .  EPINEPHrine (EPIPEN) 0.3 mg/0.3 mL SOAJ injection, Inject 0.3 mg into the muscle once as needed (for allergic reaction)., Disp: , Rfl:  .  escitalopram (LEXAPRO) 10 MG tablet, TAKE 1 TABLET (10 MG TOTAL) BY MOUTH DAILY., Disp: 30 tablet, Rfl: 3 .  estradiol (ESTRACE) 1 MG tablet, Take 0.5 mg by mouth at bedtime. , Disp: , Rfl:  .  fluticasone (FLONASE) 50 MCG/ACT nasal spray, Place 2 sprays into both nostrils daily., Disp: 16 g, Rfl: 6 .  hydrochlorothiazide (HYDRODIURIL) 12.5 MG tablet, TAKE 1/2 TABLET BY MOUTH EVERY DAY, Disp: 45 tablet, Rfl: 1 .  Multiple Vitamin (MULTIVITAMIN) capsule, Take 1 capsule by mouth daily., Disp: , Rfl:  .  OVER THE COUNTER MEDICATION, Take 1 packet by mouth daily. Dissolve one packet in a glass of water each morning. Emergen-C, Disp: , Rfl:  .  progesterone (PROMETRIUM) 100 MG capsule, Take 100 mg by mouth at bedtime. , Disp: , Rfl:  .  vitamin B-12 (CYANOCOBALAMIN) 1000 MCG tablet, Take 1,000 mcg by mouth daily., Disp: , Rfl:   EXAM:  Vitals:   04/28/17 1411  BP: 136/80  Pulse: (!) 59  Temp: 97.7 F (36.5 C)    Body mass index is 23.71 kg/m.  GENERAL: vitals reviewed and listed above, alert, oriented, appears well hydrated and in no acute distress  HEENT: atraumatic, conjunttiva clear, no obvious abnormalities on inspection of external nose and ears  NECK: no obvious masses on inspection  LUNGS: clear to  auscultation bilaterally, no wheezes, rales or rhonchi, good air movement  CV: HRRR, no peripheral edema  ABD: BS+, soft, NTTP  MS: moves all extremities without noticeable abnormality Normal Gait Normal inspection of back, no obvious scoliosis or leg length descrepancy No bony TTP Soft tissue TTP at: R low back paraspinal muscles  PSYCH: pleasant and cooperative, no obvious depression or anxiety  ASSESSMENT AND PLAN:  Discussed the following assessment and plan:  Acute right-sided low back pain without sciatica -likely muscle strain, discussed various options for treatment - she prefers to avoid medications and may try topical menthol and heat and only use tylenol if needed, HEP provided. -follow up 1 month  Urinary frequency - Plan: POC Urinalysis Dipstick -udip normal, f/u if persistent symptoms  Diarrhea, unspecified type -loose bowel, no further loose stools, diarrhea, vomiting or nausea -observe, follow up as needed  -Patient advised to return or notify a doctor immediately if symptoms worsen or persist or new concerns arise.  Patient Instructions  BEFORE YOU LEAVE: -follow up: 1 month -low back exercises  Heat 15 minutes tice daily for back  Tylenol per instructions if needed for pain - also can try topical menthol (tiger balm)  Do the exercises 4 days per week  I hope you are feeling better soon! Seek care sooner if worsening, new concerns or you are not improving with treatment.       Colin Benton R., DO

## 2017-04-28 NOTE — Telephone Encounter (Signed)
Patient states she was calling due to her insurance changing but will call Dr Theodoro Grist office if needed.

## 2017-04-28 NOTE — Patient Instructions (Signed)
BEFORE YOU LEAVE: -follow up: 1 month -low back exercises  Heat 15 minutes tice daily for back  Tylenol per instructions if needed for pain - also can try topical menthol (tiger balm)  Do the exercises 4 days per week  I hope you are feeling better soon! Seek care sooner if worsening, new concerns or you are not improving with treatment.

## 2017-05-02 ENCOUNTER — Ambulatory Visit (INDEPENDENT_AMBULATORY_CARE_PROVIDER_SITE_OTHER): Payer: Medicare Other | Admitting: Psychology

## 2017-05-02 ENCOUNTER — Other Ambulatory Visit: Payer: Self-pay | Admitting: Family Medicine

## 2017-05-02 DIAGNOSIS — F411 Generalized anxiety disorder: Secondary | ICD-10-CM | POA: Diagnosis not present

## 2017-05-16 DIAGNOSIS — H401232 Low-tension glaucoma, bilateral, moderate stage: Secondary | ICD-10-CM | POA: Diagnosis not present

## 2017-05-29 ENCOUNTER — Ambulatory Visit (INDEPENDENT_AMBULATORY_CARE_PROVIDER_SITE_OTHER): Payer: Medicare Other | Admitting: Psychology

## 2017-05-29 DIAGNOSIS — F411 Generalized anxiety disorder: Secondary | ICD-10-CM

## 2017-06-08 ENCOUNTER — Other Ambulatory Visit: Payer: Self-pay | Admitting: Family Medicine

## 2017-06-08 DIAGNOSIS — H6983 Other specified disorders of Eustachian tube, bilateral: Secondary | ICD-10-CM

## 2017-06-08 DIAGNOSIS — J309 Allergic rhinitis, unspecified: Secondary | ICD-10-CM

## 2017-06-12 ENCOUNTER — Ambulatory Visit (INDEPENDENT_AMBULATORY_CARE_PROVIDER_SITE_OTHER): Payer: Medicare Other | Admitting: Psychology

## 2017-06-12 DIAGNOSIS — F411 Generalized anxiety disorder: Secondary | ICD-10-CM

## 2017-06-16 DIAGNOSIS — N951 Menopausal and female climacteric states: Secondary | ICD-10-CM | POA: Diagnosis not present

## 2017-06-21 ENCOUNTER — Encounter: Payer: Self-pay | Admitting: Family Medicine

## 2017-06-26 ENCOUNTER — Ambulatory Visit (INDEPENDENT_AMBULATORY_CARE_PROVIDER_SITE_OTHER): Payer: Medicare Other | Admitting: Psychology

## 2017-06-26 DIAGNOSIS — F411 Generalized anxiety disorder: Secondary | ICD-10-CM

## 2017-07-19 NOTE — Progress Notes (Addendum)
HPI:  [name] is a pleasant 81 y.o. here for follow up. Chronic medical problems summarized below were reviewed for changes. She feels like she is doing okay overall. However she has had a lot of stress with dealing with her husband whom is in [place] and has dementia. Been very troubling to her as he does not like being there. She has felt like she is in a fog and is seen [name] on a regular basis and this is felt to be related to her anxiety and depression. However she is very concerned that this is a side effect Lexapro and she really wants to decrease the dose. She already is taking a very low dose of 5 mg daily. However, she would really like to try taking half of this dose. No suicidal ideation or thoughts of self-harm. No hallucinations. Denies CP, SOB, DOE, treatment intolerance or new symptoms. Due for BMP, CBC, flu shot, AWV Jan, ? dexa  HTN/HLD: -meds: hctz -extreme anxiety regarding meds (has not tolerated lisinopril, norvasc and combos)  Depression and Anxiety: -meds: very low dose lexapro -See history of present illness and Phq9 9 -sees [name] for CBT  Hot flashes: -sees Dr Philis Pique for management  ROS: See pertinent positives and negatives per HPI.  Past Medical History:  Diagnosis Date  . Allergic rhinitis, cause unspecified   . Contact dermatitis 03/16/2013  . Generalized anxiety disorder 02/19/2013   has seen [name], [name] and now [name] for counseling  . Hemorrhoids, external   . Hot flashes   . Hypertension   . Irritable bowel syndrome   . MDD (major depressive disorder) 03/18/2016  . Other and unspecified hyperlipidemia   . Palpitations 02/19/2013   On-set of symptoms March '14. EKG with NSR     Past Surgical History:  Procedure Laterality Date  . benign moles removed    . CATARACT EXTRACTION W/ INTRAOCULAR LENS IMPLANT Bilateral    right - Nov '11, left - Nov '13 [place]  . HEMORRHOID SURGERY    . sebaceous cyst excised       Family History  Problem Relation Age of Onset  . Heart disease Father   . Cancer Sister        breast  . Heart disease Other   . Heart disease Other   . Heart disease Other   . Cancer Sister        breast  . Hypothyroidism Sister   . Asthma Sister   . Rheum arthritis Paternal Grandmother   . Rheum arthritis Daughter     Social History   Social History  . Marital status: Married    Spouse name: N/A  . Number of children: 4  . Years of education: N/A   Occupational History  . retired    Social History Main Topics  . Smoking status: Former Smoker    Packs/day: 0.10    Years: 1.00    Types: Cigarettes    Quit date: 12/12/1962  . Smokeless tobacco: Never Used     Comment: smoked for 3 months in college   . Alcohol use Yes     Comment: rarely  . Drug use: No  . Sexual activity: Yes    Partners: Male   Other Topics Concern  . None   Social History Narrative   College grad. Married '61. 4 daughters, 2 grandchildren. 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   Advanced Directives: Has Living Will, HCPOA: Shan Levans Coen  [phone]        Current Outpatient Prescriptions:  .  Acetaminophen (TYLENOL PO), Take by mouth as needed., Disp: , Rfl:  .  AZO-CRANBERRY PO, Take by mouth as needed., Disp: , Rfl:  .  dorzolamide-timolol (COSOPT) 22.3-6.8 MG/ML ophthalmic solution, dorzolamide 22.3 mg-timolol 6.8 mg/mL eye drops, Disp: , Rfl:  .  EPINEPHrine (EPIPEN) 0.3 mg/0.3 mL SOAJ injection, Inject 0.3 mg into the muscle once as needed (for allergic reaction)., Disp: , Rfl:  .  escitalopram (LEXAPRO) 10 MG tablet, TAKE 1 TABLET (10 MG TOTAL) BY MOUTH DAILY., Disp: 30 tablet, Rfl: 5 .  estradiol (ESTRACE) 1 MG tablet, Take 0.5 mg by mouth at bedtime. , Disp: , Rfl:  .  fluticasone (FLONASE) 50 MCG/ACT nasal spray, USE 2 SPRAYS INTO BOTH NOSTRILS DAILY., Disp: 16 g, Rfl: 3 .  hydrochlorothiazide (HYDRODIURIL) 12.5 MG tablet, TAKE 1/2 TABLET BY MOUTH EVERY DAY, Disp: 45  tablet, Rfl: 1 .  Multiple Vitamin (MULTIVITAMIN) capsule, Take 1 capsule by mouth daily., Disp: , Rfl:  .  OVER THE COUNTER MEDICATION, Take 1 packet by mouth daily. Dissolve one packet in a glass of water each morning. Emergen-C, Disp: , Rfl:  .  progesterone (PROMETRIUM) 100 MG capsule, Take 100 mg by mouth at bedtime. , Disp: , Rfl:  .  vitamin B-12 (CYANOCOBALAMIN) 1000 MCG tablet, Take 1,000 mcg by mouth daily., Disp: , Rfl:   EXAM:  Vitals:   07/20/17 0917  BP: 100/64  Pulse: (!) 55  Temp: 97.6 F (36.4 C)    Body mass index is 24.32 kg/m.  GENERAL: vitals reviewed and listed above, alert, oriented, appears well hydrated and in no acute distress  HEENT: atraumatic, conjunttiva clear, no obvious abnormalities on inspection of external nose and ears  NECK: no obvious masses on inspection  LUNGS: clear to auscultation bilaterally, no wheezes, rales or rhonchi, good air movement  CV: HRRR, no peripheral edema  MS: moves all extremities without noticeable abnormality  PSYCH: pleasant and cooperative, no obvious depression or anxiety  ASSESSMENT AND PLAN:  Discussed the following assessment and plan:  Moderate episode of recurrent major depressive disorder (HCC) Generalized anxiety disorder -See PHQ 9. She feels safe. Discussed symptoms and management and > 8/15 minutes spent on counseling face to face. -We discussed different causes of cognitive fog and this is most likely related to worsening stress, anxiety and depression, however she feels very strongly that is a side effect of the Lexapro. She has extreme anxiety regarding medications. After lengthy discussion she would like trying a lower dose even of this or coming off of it to see how she does. We will do close follow-up and she will continue to see [name]. We also discussed the possibility of neuropsychiatric testing as she also worries about dementia.  Essential hypertension - Plan: Basic metabolic panel,  CBC -Labs today -Continue current treatment  Hot flashes - Managed by Dr. Philis Pique - gets yearly female gyn exams  -Patient advised to return or notify a doctor immediately if symptoms worsen or persist or new concerns arise.  Patient Instructions  BEFORE YOU LEAVE: -follow up: In one month with [name] -Also, please schedule annual wellness visit with [name] and follow-up with [name] in January on the same day -Labs  He'll can try the lower dose of the Lexapro if you wish. Continue counseling with [name]. Please make sure to follow up in one month so that we can see how you are doing.  I am concerned that her symptoms may be related to worsening anxiety and stress rather then a side effect to the medication. Follow up sooner if you are having worsening symptoms or new concerns.      Colin Benton R., DO

## 2017-07-20 ENCOUNTER — Encounter: Payer: Self-pay | Admitting: Family Medicine

## 2017-07-20 ENCOUNTER — Ambulatory Visit (INDEPENDENT_AMBULATORY_CARE_PROVIDER_SITE_OTHER): Payer: Medicare Other | Admitting: Family Medicine

## 2017-07-20 VITALS — BP 100/64 | HR 55 | Temp 97.6°F | Ht 63.5 in | Wt 139.5 lb

## 2017-07-20 DIAGNOSIS — R232 Flushing: Secondary | ICD-10-CM | POA: Diagnosis not present

## 2017-07-20 DIAGNOSIS — I10 Essential (primary) hypertension: Secondary | ICD-10-CM

## 2017-07-20 DIAGNOSIS — F331 Major depressive disorder, recurrent, moderate: Secondary | ICD-10-CM | POA: Diagnosis not present

## 2017-07-20 DIAGNOSIS — F411 Generalized anxiety disorder: Secondary | ICD-10-CM

## 2017-07-20 NOTE — Patient Instructions (Signed)
BEFORE YOU LEAVE: -follow up: In one month with [name] -Also, please schedule annual wellness visit with [name] and follow-up with [name] in January on the same day -Labs  He'll can try the lower dose of the Lexapro if you wish. Continue counseling with [name]. Please make sure to follow up in one month so that we can see how you are doing. I am concerned that her symptoms may be related to worsening anxiety and stress rather then a side effect to the medication. Follow up sooner if you are having worsening symptoms or new concerns.

## 2017-07-24 ENCOUNTER — Ambulatory Visit (INDEPENDENT_AMBULATORY_CARE_PROVIDER_SITE_OTHER): Payer: Medicare Other | Admitting: Psychology

## 2017-07-24 DIAGNOSIS — F411 Generalized anxiety disorder: Secondary | ICD-10-CM | POA: Diagnosis not present

## 2017-08-07 ENCOUNTER — Ambulatory Visit (INDEPENDENT_AMBULATORY_CARE_PROVIDER_SITE_OTHER): Payer: Medicare Other | Admitting: Psychology

## 2017-08-07 DIAGNOSIS — F411 Generalized anxiety disorder: Secondary | ICD-10-CM | POA: Diagnosis not present

## 2017-08-21 ENCOUNTER — Ambulatory Visit (INDEPENDENT_AMBULATORY_CARE_PROVIDER_SITE_OTHER): Payer: Medicare Other | Admitting: Psychology

## 2017-08-21 DIAGNOSIS — F411 Generalized anxiety disorder: Secondary | ICD-10-CM

## 2017-08-23 NOTE — Progress Notes (Signed)
HPI: Due for flu shot, dexa, phq9  Follow up anxiety an Depression: -she is very anxious about medications and wanted to take a subtherapeutic dose (2.5mg ) of lexapro - did trial based on her wishes last month -reports: doing well, much better, PHQ9 3 -denies:SI, thoughts of harm to self or others -seeing [name] and this is quite helpful  ROS: See pertinent positives and negatives per HPI.  Past Medical History:  Diagnosis Date  . Allergic rhinitis, cause unspecified   . Contact dermatitis 03/16/2013  . Generalized anxiety disorder 02/19/2013   has seen [name], [name] and now [name] for counseling  . Hemorrhoids, external   . Hot flashes   . Hypertension   . Irritable bowel syndrome   . MDD (major depressive disorder) 03/18/2016  . Other and unspecified hyperlipidemia   . Palpitations 02/19/2013   On-set of symptoms March '14. EKG with NSR     Past Surgical History:  Procedure Laterality Date  . benign moles removed    . CATARACT EXTRACTION W/ INTRAOCULAR LENS IMPLANT Bilateral    right - Nov '11, left - Nov '13 [place]  . HEMORRHOID SURGERY    . sebaceous cyst excised      Family History  Problem Relation Age of Onset  . Heart disease Father   . Cancer Sister        breast  . Heart disease Other   . Heart disease Other   . Heart disease Other   . Cancer Sister        breast  . Hypothyroidism Sister   . Asthma Sister   . Rheum arthritis Paternal Grandmother   . Rheum arthritis Daughter     Social History   Social History  . Marital status: Married    Spouse name: N/A  . Number of children: 4  . Years of education: N/A   Occupational History  . retired    Social History Main Topics  . Smoking status: Former Smoker    Packs/day: 0.10    Years: 1.00    Types: Cigarettes    Quit date: 12/12/1962  . Smokeless tobacco: Never Used     Comment: smoked for 3 months in college   . Alcohol use Yes     Comment: rarely  . Drug use: No  .  Sexual activity: Yes    Partners: Male   Other Topics Concern  . None   Social History Narrative   College grad. Married '61. 4 daughters, 2 grandchildren. 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   Advanced Directives: Has Living Will, HCPOA: Larue Drawdy [phone]        Current Outpatient Prescriptions:  .  Acetaminophen (TYLENOL PO), Take by mouth as needed., Disp: , Rfl:  .  AZO-CRANBERRY PO, Take by mouth as needed., Disp: , Rfl:  .  dorzolamide-timolol (COSOPT) 22.3-6.8 MG/ML ophthalmic solution, dorzolamide 22.3 mg-timolol 6.8 mg/mL eye drops, Disp: , Rfl:  .  EPINEPHrine (EPIPEN) 0.3 mg/0.3 mL SOAJ injection, Inject 0.3 mg into the muscle once as needed (for allergic reaction)., Disp: , Rfl:  .  escitalopram (LEXAPRO) 10 MG tablet, TAKE 1 TABLET (10 MG TOTAL) BY MOUTH DAILY., Disp: 30 tablet, Rfl: 5 .  estradiol (ESTRACE) 1 MG tablet, Take 0.5 mg by mouth at bedtime. , Disp: , Rfl:  .  fluticasone (FLONASE) 50 MCG/ACT nasal spray, USE 2 SPRAYS INTO BOTH NOSTRILS DAILY., Disp: 16 g, Rfl: 3 .  hydrochlorothiazide (HYDRODIURIL) 12.5 MG  tablet, TAKE 1/2 TABLET BY MOUTH EVERY DAY, Disp: 45 tablet, Rfl: 1 .  Multiple Vitamin (MULTIVITAMIN) capsule, Take 1 capsule by mouth daily., Disp: , Rfl:  .  OVER THE COUNTER MEDICATION, Take 1 packet by mouth daily. Dissolve one packet in a glass of water each morning. Emergen-C, Disp: , Rfl:  .  progesterone (PROMETRIUM) 100 MG capsule, Take 100 mg by mouth at bedtime. , Disp: , Rfl:  .  vitamin B-12 (CYANOCOBALAMIN) 1000 MCG tablet, Take 1,000 mcg by mouth daily., Disp: , Rfl:   EXAM:  Vitals:   08/24/17 0919  BP: 122/60  Pulse: (!) 52  Temp: 98 F (36.7 C)    Body mass index is 23.66 kg/m.  GENERAL: vitals reviewed and listed above, alert, oriented, appears well hydrated and in no acute distress  HEENT: atraumatic, conjunttiva clear, no obvious abnormalities on inspection of external nose and ears  NECK: no obvious masses  on inspection  LUNGS: clear to auscultation bilaterally, no wheezes, rales or rhonchi, good air movement  CV: HRRR, no peripheral edema  MS: moves all extremities without noticeable abnormality  PSYCH: pleasant and cooperative, no obvious depression or anxiety  ASSESSMENT AND PLAN:  Discussed the following assessment and plan:  Generalized anxiety disorder  Moderate episode of recurrent major depressive disorder (HCC)  -doing well on very low dose of lexapro - 1/2 tablet, she wishes to continue this and counseling with [name] -counseled face face> 50%/15 minutes -discussed letting go of stress about getting help at home, delegating, self care, etc -flu shot today -Patient advised to return or notify a doctor immediately if symptoms worsen or persist or new concerns arise.  Patient Instructions  BEFORE YOU LEAVE: -flu shot -follow up: AWV with [name] and follow up with [name], same day in February 2019  Continue current treatment.    Colin Benton R., DO

## 2017-08-24 ENCOUNTER — Ambulatory Visit (INDEPENDENT_AMBULATORY_CARE_PROVIDER_SITE_OTHER): Payer: Medicare Other | Admitting: Family Medicine

## 2017-08-24 ENCOUNTER — Encounter: Payer: Self-pay | Admitting: Family Medicine

## 2017-08-24 VITALS — BP 122/60 | HR 52 | Temp 98.0°F | Ht 63.5 in | Wt 135.7 lb

## 2017-08-24 DIAGNOSIS — Z1331 Encounter for screening for depression: Secondary | ICD-10-CM

## 2017-08-24 DIAGNOSIS — F331 Major depressive disorder, recurrent, moderate: Secondary | ICD-10-CM | POA: Diagnosis not present

## 2017-08-24 DIAGNOSIS — Z1389 Encounter for screening for other disorder: Secondary | ICD-10-CM | POA: Diagnosis not present

## 2017-08-24 DIAGNOSIS — F411 Generalized anxiety disorder: Secondary | ICD-10-CM

## 2017-08-24 DIAGNOSIS — Z23 Encounter for immunization: Secondary | ICD-10-CM | POA: Diagnosis not present

## 2017-08-24 NOTE — Addendum Note (Signed)
Addended by: Charly Lawrence on: 08/24/2017 10:10 AM   Modules accepted: Orders

## 2017-08-24 NOTE — Patient Instructions (Signed)
BEFORE YOU LEAVE: -flu shot -follow up: AWV with [name] and follow up with [name], same day in February 2019  Continue current treatment.

## 2017-08-31 ENCOUNTER — Encounter: Payer: Self-pay | Admitting: Family Medicine

## 2017-09-04 ENCOUNTER — Ambulatory Visit (INDEPENDENT_AMBULATORY_CARE_PROVIDER_SITE_OTHER): Payer: Medicare Other | Admitting: Psychology

## 2017-09-04 DIAGNOSIS — F411 Generalized anxiety disorder: Secondary | ICD-10-CM

## 2017-09-07 ENCOUNTER — Other Ambulatory Visit: Payer: Self-pay | Admitting: Family Medicine

## 2017-09-07 DIAGNOSIS — I10 Essential (primary) hypertension: Secondary | ICD-10-CM

## 2017-09-14 ENCOUNTER — Ambulatory Visit (INDEPENDENT_AMBULATORY_CARE_PROVIDER_SITE_OTHER): Payer: Medicare Other | Admitting: Family Medicine

## 2017-09-14 ENCOUNTER — Encounter: Payer: Self-pay | Admitting: Family Medicine

## 2017-09-14 VITALS — BP 120/60 | HR 71 | Temp 97.7°F | Ht 63.5 in | Wt 134.5 lb

## 2017-09-14 DIAGNOSIS — F411 Generalized anxiety disorder: Secondary | ICD-10-CM | POA: Diagnosis not present

## 2017-09-14 DIAGNOSIS — F331 Major depressive disorder, recurrent, moderate: Secondary | ICD-10-CM | POA: Diagnosis not present

## 2017-09-14 NOTE — Patient Instructions (Signed)
BEFORE YOU LEAVE: -follow up: 1 month  Schedule follow up with [name] soon.  Continue the lexapro and consider increasing to 10mg  daily.  I hope you enjoy the trip some.  Hang in there.

## 2017-09-14 NOTE — Progress Notes (Signed)
HPI:  Acute visit for anxiety and depression. She is very anxious about medications and is taking a very low dose Lexapro 5 mg daily. Check she did better on a higher dose, but she is so afraid of side effects to medicines. She has had a lot on her plate recently. Her husband is now permanently in a dementia care unit, she will be going to visit a family house talking about what to do with the house and then the family for a long time, she is been preparing for her trip, she worries about her pets that will be home during the trip. She experienced an morning where she had significant anhedonia and did not want to get out of bed. She has been having some anxiety in the morning. She has had some cognitive fog. Her first thought with all this is that maybe the medication is causing it. PHq9 score is 3 currently. Reports she feels okay today. No suicidal ideation or thoughts of harm. She sees behavioral therapy, [name]. Has not seen her in a little bit, but has appointment scheduled.  ROS: See pertinent positives and negatives per HPI.  Past Medical History:  Diagnosis Date  . Allergic rhinitis, cause unspecified   . Contact dermatitis 03/16/2013  . Generalized anxiety disorder 02/19/2013   has seen [name], [name] and now [name] for counseling  . Hemorrhoids, external   . Hot flashes   . Hypertension   . Irritable bowel syndrome   . MDD (major depressive disorder) 03/18/2016  . Other and unspecified hyperlipidemia   . Palpitations 02/19/2013   On-set of symptoms March '14. EKG with NSR     Past Surgical History:  Procedure Laterality Date  . benign moles removed    . CATARACT EXTRACTION W/ INTRAOCULAR LENS IMPLANT Bilateral    right - Nov '11, left - Nov '13 [place]  . HEMORRHOID SURGERY    . sebaceous cyst excised      Family History  Problem Relation Age of Onset  . Heart disease Father   . Cancer Sister        breast  . Heart disease Other   . Heart disease  Other   . Heart disease Other   . Cancer Sister        breast  . Hypothyroidism Sister   . Asthma Sister   . Rheum arthritis Paternal Grandmother   . Rheum arthritis Daughter     Social History   Social History  . Marital status: Married    Spouse name: N/A  . Number of children: 4  . Years of education: N/A   Occupational History  . retired    Social History Main Topics  . Smoking status: Former Smoker    Packs/day: 0.10    Years: 1.00    Types: Cigarettes    Quit date: 12/12/1962  . Smokeless tobacco: Never Used     Comment: smoked for 3 months in college   . Alcohol use Yes     Comment: rarely  . Drug use: No  . Sexual activity: Yes    Partners: Male   Other Topics Concern  . None   Social History Narrative   College grad. Married '61. 4 daughters, 2 grandchildren. 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   Advanced Directives: Has Living Will, HCPOA: Lennox Leikam [phone]        Current Outpatient Prescriptions:  .  Acetaminophen (TYLENOL PO), Take by mouth as needed.,  Disp: , Rfl:  .  AZO-CRANBERRY PO, Take by mouth as needed., Disp: , Rfl:  .  dorzolamide-timolol (COSOPT) 22.3-6.8 MG/ML ophthalmic solution, dorzolamide 22.3 mg-timolol 6.8 mg/mL eye drops, Disp: , Rfl:  .  EPINEPHrine (EPIPEN) 0.3 mg/0.3 mL SOAJ injection, Inject 0.3 mg into the muscle once as needed (for allergic reaction)., Disp: , Rfl:  .  escitalopram (LEXAPRO) 10 MG tablet, TAKE 1 TABLET (10 MG TOTAL) BY MOUTH DAILY., Disp: 30 tablet, Rfl: 5 .  estradiol (ESTRACE) 1 MG tablet, Take 0.5 mg by mouth at bedtime. , Disp: , Rfl:  .  fluticasone (FLONASE) 50 MCG/ACT nasal spray, USE 2 SPRAYS INTO BOTH NOSTRILS DAILY., Disp: 16 g, Rfl: 3 .  hydrochlorothiazide (HYDRODIURIL) 12.5 MG tablet, TAKE 1/2 TABLET BY MOUTH EVERY DAY, Disp: 45 tablet, Rfl: 1 .  Multiple Vitamin (MULTIVITAMIN) capsule, Take 1 capsule by mouth daily., Disp: , Rfl:  .  OVER THE COUNTER MEDICATION, Take 1 packet by  mouth daily. Dissolve one packet in a glass of water each morning. Emergen-C, Disp: , Rfl:  .  progesterone (PROMETRIUM) 100 MG capsule, Take 100 mg by mouth at bedtime. , Disp: , Rfl:  .  vitamin B-12 (CYANOCOBALAMIN) 1000 MCG tablet, Take 1,000 mcg by mouth daily., Disp: , Rfl:   EXAM:  Vitals:   09/14/17 1359  BP: 120/60  Pulse: 71  Temp: 97.7 F (36.5 C)    Body mass index is 23.45 kg/m.  GENERAL: vitals reviewed and listed above, alert, oriented, appears well hydrated and in no acute distress  HEENT: atraumatic, conjunttiva clear, no obvious abnormalities on inspection of external nose and ears  NECK: no obvious masses on inspection  LUNGS: clear to auscultation bilaterally, no wheezes, rales or rhonchi, good air movement  CV: HRRR, no peripheral edema  MS: moves all extremities without noticeable abnormality  PSYCH: pleasant and cooperative, no obvious depression or anxiety  ASSESSMENT AND PLAN:  Discussed the following assessment and plan:  Generalized anxiety disorder  Moderate episode of recurrent major depressive disorder ([place])  -we talked for quite some time and came to the conclusion that her symptoms are likely worsening of her anxiety, stress and depression related to current events rather than a side effect or medication -Discussed various options including increasing cognitive behavioral therapy, increasing medication, etc. -She prefers to try cognitive behavioral therapy before increasing the medication dose, but will consider -Close follow-up in 1 month -Patient advised to return or notify a doctor immediately if symptoms worsen or persist or new concerns arise.  Patient Instructions  BEFORE YOU LEAVE: -follow up: 1 month  Schedule follow up with [name] soon.  Continue the lexapro and consider increasing to 10mg  daily.  I hope you enjoy the trip some.  Hang in there.    Colin Benton R., DO

## 2017-09-26 ENCOUNTER — Ambulatory Visit (INDEPENDENT_AMBULATORY_CARE_PROVIDER_SITE_OTHER): Payer: Medicare Other | Admitting: Family Medicine

## 2017-09-26 ENCOUNTER — Encounter: Payer: Self-pay | Admitting: Family Medicine

## 2017-09-26 VITALS — BP 122/80 | HR 65 | Temp 97.7°F | Ht 63.5 in | Wt 133.7 lb

## 2017-09-26 DIAGNOSIS — F411 Generalized anxiety disorder: Secondary | ICD-10-CM

## 2017-09-26 DIAGNOSIS — F331 Major depressive disorder, recurrent, moderate: Secondary | ICD-10-CM

## 2017-09-26 DIAGNOSIS — R413 Other amnesia: Secondary | ICD-10-CM

## 2017-09-26 DIAGNOSIS — F339 Major depressive disorder, recurrent, unspecified: Secondary | ICD-10-CM | POA: Diagnosis not present

## 2017-09-26 MED ORDER — FLUOXETINE HCL 20 MG PO TABS: 20.0000 mg | ORAL_TABLET | Freq: Every day | ORAL | 3 refills | Status: DC
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
## 2017-09-26 NOTE — Patient Instructions (Addendum)
BEFORE YOU LEAVE: -MMSE with [name] if possible -follow up: 1-3 months  -We placed a referral for you as discussed to the neurologist. It usually takes about 1-2 weeks to process and schedule this referral. If you have not heard from [place] regarding this appointment in 2 weeks please contact our office.  -Call [facility] for an appointment  -can try tapering off of the lexapro, then start the prozac  -please eat three meals per day even when you do not feel hungry and keep healthy snacks around to eat between meals  -try to get outside for a walk daily  I hope you are feeling better soon  Follow up sooner if worsening or new concerns
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
## 2017-09-27 ENCOUNTER — Encounter: Payer: Self-pay | Admitting: Neurology

## 2017-10-02 ENCOUNTER — Ambulatory Visit (INDEPENDENT_AMBULATORY_CARE_PROVIDER_SITE_OTHER): Payer: Medicare Other | Admitting: Psychology

## 2017-10-02 DIAGNOSIS — F411 Generalized anxiety disorder: Secondary | ICD-10-CM

## 2017-10-09 ENCOUNTER — Encounter: Payer: Self-pay | Admitting: Family Medicine

## 2017-10-09 ENCOUNTER — Ambulatory Visit (INDEPENDENT_AMBULATORY_CARE_PROVIDER_SITE_OTHER): Payer: Medicare Other | Admitting: Family Medicine

## 2017-10-09 ENCOUNTER — Telehealth: Payer: Self-pay | Admitting: Family Medicine

## 2017-10-09 VITALS — BP 124/70 | HR 62 | Temp 97.7°F | Ht 63.5 in

## 2017-10-09 DIAGNOSIS — R197 Diarrhea, unspecified: Secondary | ICD-10-CM | POA: Diagnosis not present

## 2017-10-09 NOTE — Patient Instructions (Signed)
Drink plenty of fluids  Avoid dairy for 1 week  Imodium if any further diarrhea  Continue prozac  I hope you are feeling better soon! Follow up if worsening, new concerns or you are not improving with treatment.

## 2017-10-09 NOTE — Telephone Encounter (Signed)
[facility] - Client [place] [facility]  Patient Name: JALA DUNDON  DOB: November 18, 1936    Initial Comment Caller states she woke up with the shakes this morning.   Nurse Assessment  Nurse: Wynetta Emery, RN, Baker Janus Date/Time Eilene Ghazi Time): 10/09/2017 8:34:58 AM  Confirm and document reason for call. If symptomatic, describe symptoms. 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 -- feels like she has panic attack and lost control of bowels.  Does the patient have any new or worsening symptoms? ---Yes  Will a triage be completed? ---Yes  Related visit to physician within the last 2 weeks? ---No  Does the PT have any chronic conditions? (i.e. diabetes, asthma, etc.) ---Yes  List chronic conditions. ---anxiety  Is this a behavioral health or substance abuse call? ---No     Guidelines    Guideline Title Affirmed Question Affirmed Notes  Anxiety and Panic Attack Symptoms interfere with work or school    Final Disposition User   See Physician within 24 Hours [place], Therapist, sports, Pharmacist, hospital    Comments  NOTE: No appts with [name] all week she does not want to see another provider -- advised if she feels worse and needs to be seen another provider can take care of her and will let [name] know what transpired  NOTE: Nurse finally was able to pull schedule and found patient (office made) appointment for her at 115pm notified her of the appointment and she will keep it. will document the record --updated   Referrals  REFERRED TO PCP OFFICE   Caller Disagree/Comply Comply  Caller Understands Yes  PreDisposition Call Doctor

## 2017-10-09 NOTE — Telephone Encounter (Signed)
Noted  

## 2017-10-09 NOTE — Progress Notes (Signed)
HPI:  [name] is a pleasant 81 year old here for an acute visit for diarrhea. She woke up not feeling quite well this morning. She was sweaty and felt a little shaky. She then had an episode of diarrhea and did not quite make it bathroom. It was watery. No hematochezia, melena or mucus in the stool. No vomiting. She has not had much abdominal pain. She has not had any further diarrhea since. She did wonder if this could be the Prozac she started a few days ago. She was tolerating the Prozac well otherwise and felt that it was improvement from her other medication. She feels shaky in the mornings anyways, has for many years. She hasn't had any antibiotics or travel recently. She is around a lot of other people develop. She reports her mood has actually been better. No suicidal ideation or thoughts of self-harm. ROS: See pertinent positives and negatives per HPI.  Past Medical History:  Diagnosis Date  . Allergic rhinitis, cause unspecified   . Contact dermatitis 03/16/2013  . Generalized anxiety disorder 02/19/2013   has seen [name], [name] and now [name] for counseling  . Hemorrhoids, external   . Hot flashes   . Hypertension   . Irritable bowel syndrome   . MDD (major depressive disorder) 03/18/2016  . Other and unspecified hyperlipidemia   . Palpitations 02/19/2013   On-set of symptoms March '14. EKG with NSR     Past Surgical History:  Procedure Laterality Date  . benign moles removed    . CATARACT EXTRACTION W/ INTRAOCULAR LENS IMPLANT Bilateral    right - Nov '11, left - Nov '13 [place]  . HEMORRHOID SURGERY    . sebaceous cyst excised      Family History  Problem Relation Age of Onset  . Heart disease Father   . Cancer Sister        breast  . Heart disease Other   . Heart disease Other   . Heart disease Other   . Cancer Sister        breast  . Hypothyroidism Sister   . Asthma Sister   . Rheum arthritis Paternal Grandmother   . Rheum arthritis  Daughter     Social History   Social History  . Marital status: Married    Spouse name: N/A  . Number of children: 4  . Years of education: N/A   Occupational History  . retired    Social History Main Topics  . Smoking status: Former Smoker    Packs/day: 0.10    Years: 1.00    Types: Cigarettes    Quit date: 12/12/1962  . Smokeless tobacco: Never Used     Comment: smoked for 3 months in college   . Alcohol use Yes     Comment: rarely  . Drug use: No  . Sexual activity: Yes    Partners: Male   Other Topics Concern  . None   Social History Narrative   College grad. Married '61. 4 daughters, 2 grandchildren. 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   Advanced Directives: Has Living Will, HCPOA: Ahuva Poynor [phone]        Current Outpatient Prescriptions:  .  Acetaminophen (TYLENOL PO), Take by mouth as needed., Disp: , Rfl:  .  AZO-CRANBERRY PO, Take by mouth as needed., Disp: , Rfl:  .  dorzolamide-timolol (COSOPT) 22.3-6.8 MG/ML ophthalmic solution, dorzolamide 22.3 mg-timolol 6.8 mg/mL eye drops, Disp: , Rfl:  .  EPINEPHrine (  EPIPEN) 0.3 mg/0.3 mL SOAJ injection, Inject 0.3 mg into the muscle once as needed (for allergic reaction)., Disp: , Rfl:  .  estradiol (ESTRACE) 1 MG tablet, Take 0.5 mg by mouth at bedtime. , Disp: , Rfl:  .  FLUoxetine (PROZAC) 20 MG tablet, Take 1 tablet (20 mg total) by mouth daily., Disp: 30 tablet, Rfl: 3 .  fluticasone (FLONASE) 50 MCG/ACT nasal spray, USE 2 SPRAYS INTO BOTH NOSTRILS DAILY., Disp: 16 g, Rfl: 3 .  hydrochlorothiazide (HYDRODIURIL) 12.5 MG tablet, TAKE 1/2 TABLET BY MOUTH EVERY DAY, Disp: 45 tablet, Rfl: 1 .  Multiple Vitamin (MULTIVITAMIN) capsule, Take 1 capsule by mouth daily., Disp: , Rfl:  .  OVER THE COUNTER MEDICATION, Take 1 packet by mouth daily. Dissolve one packet in a glass of water each morning. Emergen-C, Disp: , Rfl:  .  progesterone (PROMETRIUM) 100 MG capsule, Take 100 mg by mouth at bedtime. ,  Disp: , Rfl:  .  vitamin B-12 (CYANOCOBALAMIN) 1000 MCG tablet, Take 1,000 mcg by mouth daily., Disp: , Rfl:   EXAM:  Vitals:   10/09/17 1253  BP: 124/70  Pulse: 62  Temp: 97.7 F (36.5 C)    There is no height or weight on file to calculate BMI.  GENERAL: vitals reviewed and listed above, alert, oriented, appears well hydrated and in no acute distress  HEENT: atraumatic, conjunttiva clear, no obvious abnormalities on inspection of external nose and ears  NECK: no obvious masses on inspection  LUNGS: clear to auscultation bilaterally, no wheezes, rales or rhonchi, good air movement  CV: HRRR, no peripheral edema  ABS: BS+, soft, NTTP  MS: moves all extremities without noticeable abnormality  PSYCH: pleasant and cooperative, no obvious depression or anxiety  ASSESSMENT AND PLAN:  Discussed the following assessment and plan:  Diarrhea, unspecified type  -this may likely be a mild stomach bug, from something she ate or a virus and its likely -Discussed the possibility of side effect to medication, probably less likely given she has been on an SSRI in the past without these types of issues -Since her symptoms have subsided she has opted to continue the Prozac, avoid dairy, use Imodium as needed and drink plenty of fluids -She has an appointment set up with a neurologist and plans to schedule an appointment with psychiatry -Patient advised to return or notify a doctor immediately if symptoms worsen or persist or new concerns arise.  Patient Instructions  Drink plenty of fluids  Avoid dairy for 1 week  Imodium if any further diarrhea  Continue prozac  I hope you are feeling better soon! Follow up if worsening, new concerns or you are not improving with treatment.     Colin Benton R., DO

## 2017-10-16 ENCOUNTER — Ambulatory Visit (INDEPENDENT_AMBULATORY_CARE_PROVIDER_SITE_OTHER): Payer: Medicare Other | Admitting: Psychology

## 2017-10-16 DIAGNOSIS — F411 Generalized anxiety disorder: Secondary | ICD-10-CM

## 2017-10-17 ENCOUNTER — Ambulatory Visit: Payer: Self-pay | Admitting: Family Medicine

## 2017-10-30 ENCOUNTER — Ambulatory Visit (INDEPENDENT_AMBULATORY_CARE_PROVIDER_SITE_OTHER): Payer: Medicare Other | Admitting: Psychology

## 2017-10-30 DIAGNOSIS — F411 Generalized anxiety disorder: Secondary | ICD-10-CM | POA: Diagnosis not present

## 2017-11-13 ENCOUNTER — Ambulatory Visit (INDEPENDENT_AMBULATORY_CARE_PROVIDER_SITE_OTHER): Payer: Medicare Other | Admitting: Psychology

## 2017-11-13 DIAGNOSIS — F411 Generalized anxiety disorder: Secondary | ICD-10-CM | POA: Diagnosis not present

## 2017-11-16 DIAGNOSIS — H401232 Low-tension glaucoma, bilateral, moderate stage: Secondary | ICD-10-CM | POA: Diagnosis not present

## 2017-11-27 ENCOUNTER — Ambulatory Visit (INDEPENDENT_AMBULATORY_CARE_PROVIDER_SITE_OTHER): Payer: Medicare Other | Admitting: Psychology

## 2017-11-27 DIAGNOSIS — F411 Generalized anxiety disorder: Secondary | ICD-10-CM

## 2017-12-18 ENCOUNTER — Encounter: Payer: Self-pay | Admitting: Neurology

## 2017-12-18 ENCOUNTER — Other Ambulatory Visit: Payer: Medicare Other

## 2017-12-18 ENCOUNTER — Ambulatory Visit (INDEPENDENT_AMBULATORY_CARE_PROVIDER_SITE_OTHER): Payer: Medicare Other | Admitting: Neurology

## 2017-12-18 VITALS — BP 132/78 | HR 56 | Ht 63.0 in | Wt 140.0 lb

## 2017-12-18 DIAGNOSIS — R413 Other amnesia: Secondary | ICD-10-CM

## 2017-12-18 DIAGNOSIS — F419 Anxiety disorder, unspecified: Secondary | ICD-10-CM

## 2017-12-18 NOTE — Progress Notes (Addendum)
NEUROLOGY CONSULTATION NOTE  [name] MRN: [id number] DOB: 12-Jun-1936  Referring provider: Dr. Colin Benton Primary care provider: Dr. Colin Benton  Reason for consult:  Memory loss  Dear Dr Maudie Mercury:  Thank you for your kind referral of [name] for consultation of the above symptoms. Although her history is well known to you, please allow me to reiterate it for the purpose of our medical record. The patient was accompanied to the clinic by her 2 daughters who also provide collateral information. Records and images were personally reviewed where available.   HISTORY OF PRESENT ILLNESS: This is a pleasant 82 year old right-handed woman with a history of hypertension, anxiety, depression, presenting for evaluation of memory loss. She feels her memory is "a little shaky." She endorses a lot of anxiety, and states it is causing her not to recall things and she does not understand it. Family started noticing memory changes around 2 years ago. She has been the main caregiver for her husband with dementia, who had been in and out of the hospital several times. She was "not dealing with things as well as I should." For instance, 2 years ago she was taking care of the managing their family home, but they noticed she was not keeping up with it, and turned it over to her sister. Her daughter took over finances in the Spring 2018 because she was afraid she would forget and she had never done the taxes in the past. She lives alone and denies missing medications. She denies getting lost driving. She has 3 cats at home that are cared for, she does not forget to feed them. She has no difficulties running the household, doing laundry and yardwork. She has learned how to use the leafblower. Their main concern is there anxiety and depression have "really ramped up." She is so stressed and scattered, that she could not stay on task. Family started noticing anxiety around 4-5 years ago, but in January 2017 she was  "in breakdown territory" and started Lexapro and counseling. It appears she was on a very low dose due to concern for side effects. She would wake up every morning between 3-4 AM with a panic attack, unable to reason with herself, shaky. Family reports that she would forget what time they told her she would be picked up, even if it was in a text message in front of her. They have noticed lack of focus and concentration. She would ask the same question about this doctor's appointment and was quite anxious about today's visit. Family reports she "tends to catastrophize things." No paranoia or hallucinations. She was switched from Lexapro to Prozac at the family's request, and "something about Prozac bothers me all in my head."   She denies any headaches, vertigo, diplopia, dysarthria/dysphagia, neck/back pain, focal numbness/tingling/weakness, bowel/bladder dysfunction. She reports a "mental dizziness," where she states "my brain just feels full." She has tremors, R>L. She had lost her sense of smell after sinus surgery many years ago. Her mother had dementia in her 2s, her older sister has memory issues. No history of significant head injuries, no alcohol use.    PAST MEDICAL HISTORY: Past Medical History:  Diagnosis Date  . Allergic rhinitis, cause unspecified   . Contact dermatitis 03/16/2013  . Generalized anxiety disorder 02/19/2013   has seen [name], [name] and now [name] for counseling  . Hemorrhoids, external   . Hot flashes   . Hypertension   . Irritable bowel syndrome   .  MDD (major depressive disorder) 03/18/2016  . Other and unspecified hyperlipidemia   . Palpitations 02/19/2013   On-set of symptoms March '14. EKG with NSR     PAST SURGICAL HISTORY: Past Surgical History:  Procedure Laterality Date  . benign moles removed    . CATARACT EXTRACTION W/ INTRAOCULAR LENS IMPLANT Bilateral    right - Nov '11, left - Nov '13 [place]  . HEMORRHOID SURGERY    .  sebaceous cyst excised      MEDICATIONS: Current Outpatient Medications on File Prior to Visit  Medication Sig Dispense Refill  . Acetaminophen (TYLENOL PO) Take by mouth as needed.    . AZO-CRANBERRY PO Take by mouth as needed.    . dorzolamide-timolol (COSOPT) 22.3-6.8 MG/ML ophthalmic solution dorzolamide 22.3 mg-timolol 6.8 mg/mL eye drops    . EPINEPHrine (EPIPEN) 0.3 mg/0.3 mL SOAJ injection Inject 0.3 mg into the muscle once as needed (for allergic reaction).    [name] estradiol (ESTRACE) 1 MG tablet Take 0.5 mg by mouth at bedtime.     [name] FLUoxetine (PROZAC) 20 MG tablet Take 1 tablet (20 mg total) by mouth daily. 30 tablet 3  . fluticasone (FLONASE) 50 MCG/ACT nasal spray USE 2 SPRAYS INTO BOTH NOSTRILS DAILY. 16 g 3  . hydrochlorothiazide (HYDRODIURIL) 12.5 MG tablet TAKE 1/2 TABLET BY MOUTH EVERY DAY 45 tablet 1  . Multiple Vitamin (MULTIVITAMIN) capsule Take 1 capsule by mouth daily.    [name] OVER THE COUNTER MEDICATION Take 1 packet by mouth daily. Dissolve one packet in a glass of water each morning. Emergen-C    . progesterone (PROMETRIUM) 100 MG capsule Take 100 mg by mouth at bedtime.     . vitamin B-12 (CYANOCOBALAMIN) 1000 MCG tablet Take 1,000 mcg by mouth daily.     No current facility-administered medications on file prior to visit.     ALLERGIES: Allergies  Allergen Reactions  . Cephalexin     REACTION: diaphoretic and drop in Blood pressure  . Lorazepam Other (See Comments)    amnesia    FAMILY HISTORY: Family History  Problem Relation Age of Onset  . Heart disease Father   . Cancer Sister        breast  . Heart disease Other   . Heart disease Other   . Heart disease Other   . Cancer Sister        breast  . Hypothyroidism Sister   . Asthma Sister   . Rheum arthritis Paternal Grandmother   . Rheum arthritis Daughter     SOCIAL HISTORY: Social History   Socioeconomic History  . Marital status: Married    Spouse name: Not on file  . Number of  children: 4  . Years of education: Not on file  . Highest education level: Not on file  Social Needs  . Financial resource strain: Not on file  . Food insecurity - worry: Not on file  . Food insecurity - inability: Not on file  . Transportation needs - medical: Not on file  . Transportation needs - non-medical: Not on file  Occupational History  . Occupation: retired  Tobacco Use  . Smoking status: Former Smoker    Packs/day: 0.10    Years: 1.00    Pack years: 0.10    Types: Cigarettes    Last attempt to quit: 12/12/1962    Years since quitting: 55.0  . Smokeless tobacco: Never Used  . Tobacco comment: smoked for 3 months in college   Substance and  Sexual Activity  . Alcohol use: Yes    Comment: rarely  . Drug use: No  . Sexual activity: Yes    Partners: Male  Other Topics Concern  . Not on file  Social History Narrative   College grad. Married '61. 4 daughters, 2 grandchildren. 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   Advanced Directives: Has Living Will, HCPOA: Jazmon Kos [phone]    REVIEW OF SYSTEMS: Constitutional: No fevers, chills, or sweats, no generalized fatigue, change in appetite Eyes: No visual changes, double vision, eye pain Ear, nose and throat: No hearing loss, ear pain, nasal congestion, sore throat Cardiovascular: No chest pain, palpitations Respiratory:  No shortness of breath at rest or with exertion, wheezes GastrointestinaI: No nausea, vomiting, diarrhea, abdominal pain, fecal incontinence Genitourinary:  No dysuria, urinary retention or frequency Musculoskeletal:  No neck pain, back pain Integumentary: No rash, pruritus, skin lesions Neurological: as above Psychiatric: No depression, insomnia,+ anxiety Endocrine: No palpitations, fatigue, diaphoresis, mood swings, change in appetite, change in weight, increased thirst Hematologic/Lymphatic:  No anemia, purpura, petechiae. Allergic/Immunologic: no itchy/runny eyes, nasal congestion,  recent allergic reactions, rashes  PHYSICAL EXAM: Vitals:   12/18/17 1348  BP: 132/78  Pulse: (!) 56  SpO2: 95%   General: No acute distress, anxious. She was initially noted to have occasional right shoulder jerks at the start of the visit, this seemed to quiet down as she started to calm down Head:  Normocephalic/atraumatic Eyes: Fundoscopic exam shows bilateral sharp discs, no vessel changes, exudates, or hemorrhages Neck: supple, no paraspinal tenderness, full range of motion Back: No paraspinal tenderness Heart: regular rate and rhythm Lungs: Clear to auscultation bilaterally. Vascular: No carotid bruits. Skin/Extremities: No rash, no edema Neurological Exam: Mental status: alert and oriented to person, place, and time, no dysarthria or aphasia, Fund of knowledge is appropriate.  Recent and remote memory are intact.  Attention and concentration are normal.    Able to name objects and repeat phrases. CDT 5/5. Able to name 17 words starting with F in one minute (normal >11). MMSE - Mini Mental State Exam 12/18/2017 09/26/2017 01/10/2017  Not completed: - - (No Data)  Orientation to time 4 3 -  Orientation to Place 4 5 -  Registration 3 3 -  Attention/ Calculation 5 5 -  Recall 3 3 -  Language- name 2 objects 2 2 -  Language- repeat 1 1 -  Language- follow 3 step command 3 3 -  Language- read & follow direction 1 1 -  Write a sentence 1 1 -  Copy design 1 1 -  Total score 28 28 -   Cranial nerves: CN I: not tested CN II: pupils equal, round and reactive to light, visual fields intact, fundi unremarkable. CN III, IV, VI:  full range of motion, no nystagmus, no ptosis CN V: facial sensation intact CN VII: upper and lower face symmetric CN VIII: hearing intact to finger rub CN IX, X: gag intact, uvula midline CN XI: sternocleidomastoid and trapezius muscles intact CN XII: tongue midline Bulk & Tone: normal, no coghweeling, no fasciculations. Motor: 5/5 throughout with no  pronator drift. Sensation: intact to light touch, cold, pin, vibration and joint position sense.  No extinction to double simultaneous stimulation.  Romberg test negative Deep Tendon Reflexes: +2 throughout, no ankle clonus Plantar responses: downgoing bilaterally Cerebellar: no incoordination on finger to nose, heel to shin. No dysdiadochokinesia Gait: narrow-based and steady, able to tandem walk adequately. Tremor: none during testing  IMPRESSION: This is a 82 year old right-handed woman with a history of hypertension, anxiety, depression, presenting for evaluation of worsening memory. She is also noted to be very anxious by history and on exam. Her neurological exam is non-focal. She was noted to have occasional right shoulder jerks at the beginning of the visit, but this seemed to quiet down as she became less anxious. MMSE today 28/30. We discussed different causes of memory loss. Check TSH and B12. MRI brain without contrast will be ordered to assess for underlying structural abnormality and assess vascular load. We discussed how anxiety can also cause memory issues, she is scheduled to see her therapist and psychiatry. We discussed addressing anxiety first, and if this is better controlled and memory issues continue, we will plan for Neuropsychological testing. We discussed the importance of control of vascular risk factors, physical exercise, and brain stimulation exercises for brain health. She will follow-up in 6 months and knows to call for any changes.   Thank you for allowing me to participate in the care of this patient. Please do not hesitate to call for any questions or concerns.   Ellouise Newer, M.D.  CC: [name]

## 2017-12-18 NOTE — Patient Instructions (Addendum)
1. Schedule MRI brain without contrast  We have sent a referral to [facility] for your MRI and they will call you directly to schedule your appt. They are located at [place]. If you need to contact them directly please call [phone].   2. Bloodwork for TSH, B12  Your provider has requested that you have labwork completed today. Please go to [facility] ([id number]) on the second floor of this building before leaving the office today. You do not need to check in. If you are not called within 15 minutes please check with the front desk.   3. Proceed with visit with [name] and psychiatric nurse for anxiety 4. Follow-up in 6 months, call for any changes  RECOMMENDATIONS FOR ALL PATIENTS WITH MEMORY CHANGES: 1. Continue to exercise (Recommend 30 minutes of walking everyday, or 3 hours every week) 2. Increase social interactions - continue going to [place] and enjoy social gatherings with friends and family 3. Eat healthy, avoid fried foods and eat more fruits and vegetables 4. Maintain adequate blood pressure, blood sugar, and blood cholesterol level. Reducing the risk of stroke and cardiovascular disease also helps promoting better memory. 5. Avoid stressful situations. Live a simple life and avoid aggravations. Organize your time and prepare for the next day in anticipation. 6. Sleep well, avoid any interruptions of sleep and avoid any distractions in the bedroom that may interfere with adequate sleep quality 7. Avoid sugar, avoid sweets as there is a strong link between excessive sugar intake, diabetes, and cognitive impairment We discussed the Mediterranean diet, which has been shown to help patients reduce the risk of progressive memory disorders and reduces cardiovascular risk. This includes eating fish, eat fruits and green leafy vegetables, nuts like almonds and hazelnuts, walnuts, and also use olive oil. Avoid fast foods and fried foods as much as possible.  Avoid sweets and sugar as sugar use has been linked to worsening of memory function.

## 2017-12-19 LAB — TSH: TSH: 1.6 mIU/L (ref 0.40–4.50)

## 2017-12-19 LAB — VITAMIN B12: Vitamin B-12: 1048 pg/mL (ref 200–1100)

## 2017-12-21 ENCOUNTER — Encounter: Payer: Self-pay | Admitting: Family Medicine

## 2017-12-21 ENCOUNTER — Telehealth: Payer: Self-pay

## 2017-12-21 NOTE — Telephone Encounter (Signed)
-----   Message from Cameron Sprang, MD sent at 12/19/2017 12:32 PM EST ----- Pls let him know thyroid and B12 levels are normal. Thanks

## 2017-12-21 NOTE — Telephone Encounter (Signed)
LMOM relaying message below.  

## 2017-12-25 ENCOUNTER — Ambulatory Visit (INDEPENDENT_AMBULATORY_CARE_PROVIDER_SITE_OTHER): Payer: Medicare Other | Admitting: Psychology

## 2017-12-25 DIAGNOSIS — F411 Generalized anxiety disorder: Secondary | ICD-10-CM

## 2017-12-29 ENCOUNTER — Ambulatory Visit
Admission: RE | Admit: 2017-12-29 | Discharge: 2017-12-29 | Disposition: A | Discharge status: Home / Self Care | Payer: Medicare Other | Source: Ambulatory Visit | Attending: Neurology | Admitting: Neurology

## 2017-12-29 DIAGNOSIS — R413 Other amnesia: Secondary | ICD-10-CM | POA: Diagnosis not present

## 2017-12-29 DIAGNOSIS — R410 Disorientation, unspecified: Secondary | ICD-10-CM | POA: Diagnosis not present

## 2017-12-29 DIAGNOSIS — F419 Anxiety disorder, unspecified: Secondary | ICD-10-CM

## 2018-01-01 DIAGNOSIS — F410 Panic disorder [episodic paroxysmal anxiety] without agoraphobia: Secondary | ICD-10-CM | POA: Diagnosis not present

## 2018-01-02 ENCOUNTER — Telehealth: Payer: Self-pay | Admitting: Neurology

## 2018-01-02 ENCOUNTER — Telehealth: Payer: Self-pay

## 2018-01-02 NOTE — Telephone Encounter (Signed)
Call pt.  No answer.  LMOM relaying message below.

## 2018-01-02 NOTE — Telephone Encounter (Signed)
Patient called regarding her MRI results. She asked could a letter or something be mailed to her regarding those MRI results. She said her family would like to see the results. They are worried about her. Thanks

## 2018-01-02 NOTE — Telephone Encounter (Signed)
-----   Message from Cameron Sprang, MD sent at 01/01/2018  8:36 AM EST ----- Pls let her know the MRI brain did not show any evidence of tumor, stroke, or bleed. It showed age-related changes. Thanks

## 2018-01-03 NOTE — Telephone Encounter (Signed)
Letter written.  LMOM letting pt know that letter is at our front desk for pick up.  Will send out via [employer] Monday if not picked up by end of this week.

## 2018-01-08 ENCOUNTER — Ambulatory Visit: Payer: Self-pay | Admitting: Psychology

## 2018-01-08 NOTE — Progress Notes (Signed)
HPI:  [name] is a pleasant 82 y.o. here for follow up. Chronic medical problems summarized below were reviewed for changes and stability and were updated as needed below.  She is now seeing pschiatry at [facility]. On zoloft the last 1 week. Had neg eval with neurologist. Husband's health declining. No SI or severe symptoms. Denies CP, SOB, DOE, treatment intolerance or new symptoms. Due for labs. There was a mixup with her annual wellness visit, they plan to reschedule with [name].  HTN/HLD: -meds: hctz -extreme anxiety regarding meds (has not tolerated lisinopril, norvasc and combos)  Depression and Anxiety: -With cognitive dysfunction, saw neurology for evaluation -meds: Now on Prozac, used to take -See history of present illness and Phq9 9 -sees [name] for CBT  Hot flashes: -sees Dr Philis Pique for management    ROS: See pertinent positives and negatives per HPI.  Past Medical History:  Diagnosis Date  . Allergic rhinitis, cause unspecified   . Contact dermatitis 03/16/2013  . Generalized anxiety disorder 02/19/2013   has seen [name], [name] and now [name] for counseling  . Hemorrhoids, external   . Hot flashes   . Hypertension   . Irritable bowel syndrome   . MDD (major depressive disorder) 03/18/2016  . Other and unspecified hyperlipidemia   . Palpitations 02/19/2013   On-set of symptoms March '14. EKG with NSR     Past Surgical History:  Procedure Laterality Date  . benign moles removed    . CATARACT EXTRACTION W/ INTRAOCULAR LENS IMPLANT Bilateral    right - Nov '11, left - Nov '13 [place]  . HEMORRHOID SURGERY    . MANDIBLE SURGERY    . sebaceous cyst excised      Family History  Problem Relation Age of Onset  . Dementia Mother   . Heart disease Father   . Cancer Sister        breast  . Heart disease Other   . Heart disease Other   . Heart disease Other   . Cancer Sister        breast  . Hypothyroidism Sister   . Asthma  Sister   . Rheum arthritis Paternal Grandmother   . Rheum arthritis Daughter     Social History   Socioeconomic History  . Marital status: Married    Spouse name: None  . Number of children: 4  . Years of education: None  . Highest education level: None  Social Needs  . Financial resource strain: None  . Food insecurity - worry: None  . Food insecurity - inability: None  . Transportation needs - medical: None  . Transportation needs - non-medical: None  Occupational History  . Occupation: retired  Tobacco Use  . Smoking status: Former Smoker    Packs/day: 0.10    Years: 1.00    Pack years: 0.10    Types: Cigarettes    Last attempt to quit: 12/12/1962    Years since quitting: 55.1  . Smokeless tobacco: Never Used  . Tobacco comment: smoked for 3 months in college   Substance and Sexual Activity  . Alcohol use: Yes    Comment: rarely  . Drug use: No  . Sexual activity: Yes    Partners: Male  Other Topics Concern  . None  Social History Narrative   College grad. Married '61. 4 daughters, 2 grandchildren. 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   Advanced Directives: Has Living Will, HCPOA: Shan Levans Mcguire [phone]  Pt lives in 2 story home - her husband currently resides in nursing facility     Current Outpatient Medications:  .  Acetaminophen (TYLENOL PO), Take by mouth as needed., Disp: , Rfl:  .  AZO-CRANBERRY PO, Take by mouth as needed., Disp: , Rfl:  .  dorzolamide-timolol (COSOPT) 22.3-6.8 MG/ML ophthalmic solution, dorzolamide 22.3 mg-timolol 6.8 mg/mL eye drops, Disp: , Rfl:  .  EPINEPHrine (EPIPEN) 0.3 mg/0.3 mL SOAJ injection, Inject 0.3 mg into the muscle once as needed (for allergic reaction)., Disp: , Rfl:  .  estradiol (ESTRACE) 1 MG tablet, Take 0.5 mg by mouth at bedtime. , Disp: , Rfl:  .  fluticasone (FLONASE) 50 MCG/ACT nasal spray, USE 2 SPRAYS INTO BOTH NOSTRILS DAILY., Disp: 16 g, Rfl: 3 .  hydrochlorothiazide (HYDRODIURIL) 12.5 MG  tablet, TAKE 1/2 TABLET BY MOUTH EVERY DAY, Disp: 45 tablet, Rfl: 1 .  Multiple Vitamin (MULTIVITAMIN) capsule, Take 1 capsule by mouth daily., Disp: , Rfl:  .  OVER THE COUNTER MEDICATION, Take 1 packet by mouth daily. Dissolve one packet in a glass of water each morning. Emergen-C, Disp: , Rfl:  .  progesterone (PROMETRIUM) 100 MG capsule, Take 100 mg by mouth at bedtime. , Disp: , Rfl:  .  sertraline (ZOLOFT) 25 MG tablet, Take 25 mg by mouth daily., Disp: , Rfl:  .  vitamin B-12 (CYANOCOBALAMIN) 1000 MCG tablet, Take 1,000 mcg by mouth daily., Disp: , Rfl:   EXAM:  Vitals:   01/11/18 1008  BP: 100/62  Pulse: (!) 50  Temp: 97.6 F (36.4 C)    Body mass index is 24.27 kg/m.  GENERAL: vitals reviewed and listed above, alert, oriented, appears well hydrated and in no acute distress  HEENT: atraumatic, conjunttiva clear, no obvious abnormalities on inspection of external nose and ears  NECK: no obvious masses on inspection  LUNGS: clear to auscultation bilaterally, no wheezes, rales or rhonchi, good air movement  CV: HRRR, no peripheral edema  MS: moves all extremities without noticeable abnormality  PSYCH: pleasant and cooperative, no obvious depression or anxiety  ASSESSMENT AND PLAN:  Discussed the following assessment and plan:  Essential hypertension - Plan: Basic metabolic panel, CBC  Hyperlipidemia, unspecified hyperlipidemia type - Plan: Lipid panel  Episode of recurrent major depressive disorder, unspecified depression episode severity ([place])  B12 deficiency - Plan: Vitamin B12  Vitamin D deficiency - Plan: VITAMIN D 25 Hydroxy (Vit-D Deficiency, Fractures)  -labs per orders, daughter worried about vit D def -cont care with psychiatry for mood -advised regular exercise and she is considering [employer] -follow up with [name] for awv -Patient advised to return or notify a doctor immediately if symptoms worsen or persist or new concerns arise.  Patient  Instructions  BEFORE YOU LEAVE: -labs -follow up:  1) AWV with susan when convenient for her in the next few weeks 2) follow up with [name] in 3-4 months  We have ordered labs or studies at this visit. It can take up to 1-2 weeks for results and processing. IF results require follow up or explanation, we will call you with instructions. Clinically stable results will be released to your [facility]. If you have not heard from [place] or cannot find your results in [facility] in 2 weeks please contact our office at [phone].  If you are not yet signed up for [facility], please consider signing up.           [name], DO

## 2018-01-11 ENCOUNTER — Encounter: Payer: Self-pay | Admitting: Family Medicine

## 2018-01-11 ENCOUNTER — Ambulatory Visit (INDEPENDENT_AMBULATORY_CARE_PROVIDER_SITE_OTHER): Payer: Medicare Other | Admitting: Family Medicine

## 2018-01-11 VITALS — BP 100/62 | HR 50 | Temp 97.6°F | Ht 63.0 in | Wt 137.0 lb

## 2018-01-11 DIAGNOSIS — I10 Essential (primary) hypertension: Secondary | ICD-10-CM

## 2018-01-11 DIAGNOSIS — E559 Vitamin D deficiency, unspecified: Secondary | ICD-10-CM

## 2018-01-11 DIAGNOSIS — E538 Deficiency of other specified B group vitamins: Secondary | ICD-10-CM | POA: Diagnosis not present

## 2018-01-11 DIAGNOSIS — E785 Hyperlipidemia, unspecified: Secondary | ICD-10-CM | POA: Diagnosis not present

## 2018-01-11 DIAGNOSIS — F339 Major depressive disorder, recurrent, unspecified: Secondary | ICD-10-CM

## 2018-01-11 LAB — LIPID PANEL
Cholesterol: 257 mg/dL — ABNORMAL HIGH (ref 0–200)
HDL: 67.1 mg/dL (ref >39.00)
LDL Cholesterol: 171 mg/dL — ABNORMAL HIGH (ref 0–99)
NONHDL: 189.53
TRIGLYCERIDES: 91 mg/dL (ref 0.0–149.0)
Total CHOL/HDL Ratio: 4
VLDL: 18.2 mg/dL (ref 0.0–40.0)

## 2018-01-11 LAB — BASIC METABOLIC PANEL
BUN: 19 mg/dL (ref 6–23)
CALCIUM: 9.2 mg/dL (ref 8.4–10.5)
CO2: 30 mEq/L (ref 19–32)
Chloride: 100 mEq/L (ref 96–112)
Creatinine, Ser: 0.74 mg/dL (ref 0.40–1.20)
GFR: 80.26 mL/min (ref >60.00)
Glucose, Bld: 99 mg/dL (ref 70–99)
Potassium: 4.1 mEq/L (ref 3.5–5.1)
SODIUM: 136 meq/L (ref 135–145)

## 2018-01-11 LAB — CBC
HCT: 42.8 % (ref 36.0–46.0)
Hemoglobin: 14.6 g/dL (ref 12.0–15.0)
MCHC: 34.1 g/dL (ref 30.0–36.0)
MCV: 92 fl (ref 78.0–100.0)
Platelets: 268 10*3/uL (ref 150.0–400.0)
RBC: 4.65 Mil/uL (ref 3.87–5.11)
RDW: 12.6 % (ref 11.5–15.5)
WBC: 8.7 10*3/uL (ref 4.0–10.5)

## 2018-01-11 LAB — VITAMIN B12: Vitamin B-12: 625 pg/mL (ref 211–911)

## 2018-01-11 LAB — VITAMIN D 25 HYDROXY (VIT D DEFICIENCY, FRACTURES): VITD: 22.07 ng/mL — ABNORMAL LOW (ref 30.00–100.00)

## 2018-01-11 NOTE — Patient Instructions (Signed)
BEFORE YOU LEAVE: -labs -follow up:  1) AWV with susan when convenient for her in the next few weeks 2) follow up with [name] in 3-4 months  We have ordered labs or studies at this visit. It can take up to 1-2 weeks for results and processing. IF results require follow up or explanation, we will call you with instructions. Clinically stable results will be released to your [facility]. If you have not heard from [place] or cannot find your results in [facility] in 2 weeks please contact our office at [phone].  If you are not yet signed up for [facility], please consider signing up.

## 2018-01-18 DIAGNOSIS — Z1231 Encounter for screening mammogram for malignant neoplasm of breast: Secondary | ICD-10-CM | POA: Diagnosis not present

## 2018-01-18 DIAGNOSIS — Z124 Encounter for screening for malignant neoplasm of cervix: Secondary | ICD-10-CM | POA: Diagnosis not present

## 2018-01-18 LAB — HM PAP SMEAR

## 2018-01-19 ENCOUNTER — Ambulatory Visit (INDEPENDENT_AMBULATORY_CARE_PROVIDER_SITE_OTHER): Payer: Medicare Other

## 2018-01-19 VITALS — BP 140/80 | HR 56 | Ht 63.5 in | Wt 138.0 lb

## 2018-01-19 DIAGNOSIS — Z0000 Encounter for general adult medical examination without abnormal findings: Secondary | ICD-10-CM | POA: Diagnosis not present

## 2018-01-19 NOTE — Progress Notes (Addendum)
Subjective:   [name] is a 82 y.o. female who presents for Medicare Annual (Subsequent) preventive examination.  Last AWV 01/10/2017 Seeing [name] - in the past  Managing psych meds with [name]  Also followed by [facility] Relocation of spouse at [facility] Lives at home now alone Spouse is still living; doing better now and liking the staff   Dtr and Ms. Mccraw taking him  to [facility] to change [facility] q 3 weeks    Has a pleasant smile today  Likes to get out and do things  Enjoys her dtrs - all 4 live here  Just celebrated her 82th birthday Has 2 grandchildren States she is doing much better and enjoying her life   Reports health as good   Diet BMI 24 - energy  Chol /hdl ratio 4/ chol 257; hdl 67; trig 91;  Breakfast cereal dtr cooking for her some; will bring meals if she is not feeling well Sandwich for lunch dtr cooks some Now likes ice cream Educated regarding better alternatives as ice cream bars with protein and less saturated fat and eating more lentils; whole wheat  Given cholesterol information so that she can add some good foods to her menu    [facility] Walks in the neighborhood  This year, working outside; Freight forwarder and shoveling with yard full of oak trees and walks. Listened to old audio tapes  Walks 30 to 45 minutes    Health Maintenance Due  Topic Date Due  . DEXA SCAN  01/17/2003   No dexa - was ordered by [name]   Colonoscopy 07/2009 Mammogram 04/2010 - actually had mammogram yesterday at [facility]   Cardiac Risk Factors include: advanced age (>89men, >73 women);dyslipidemia;family history of premature cardiovascular disease;hypertensionSisters with hx of breast cancer  Former smoker; remote history of 3 months only in college EtOH - no     Objective:     Vitals: BP 140/80   Pulse (!) 56   Ht 5' 3.5" (1.613 m)   Wt 138 lb (62.6 kg)   SpO2 97%   BMI 24.06 kg/m   Body mass index is 24.06  kg/m.  BP slightly elevated but states she did stop and have a sausage biscuit this am which she does not do very often   Advanced Directives 01/10/2017 12/24/2015 05/07/2014  Does Patient Have a Medical Advance Directive? Yes Yes Patient does not have advance directive  Type of Advance Directive - [place];Living will -  Copy of [place] in Chart? - No - copy requested -    Advanced Directives: Has Living Will, HCPOA: [name] [phone]  Tobacco Social History   Tobacco Use  Smoking Status Former Smoker  . Packs/day: 0.10  . Years: 1.00  . Pack years: 0.10  . Types: Cigarettes  . Last attempt to quit: 12/12/1962  . Years since quitting: 55.1  Smokeless Tobacco Never Used  Tobacco Comment   smoked for 3 months in college      Counseling given: Not Answered Comment: smoked for 3 months in college    Clinical Intake:    Past Medical History:  Diagnosis Date  . Allergic rhinitis, cause unspecified   . Contact dermatitis 03/16/2013  . Generalized anxiety disorder 02/19/2013   has seen [name], [name] and now [name] for counseling  . Hemorrhoids, external   . Hot flashes   . Hypertension   .  Irritable bowel syndrome   . MDD (major depressive disorder) 03/18/2016  . Other and unspecified hyperlipidemia   . Palpitations 02/19/2013   On-set of symptoms March '14. EKG with NSR    Past Surgical History:  Procedure Laterality Date  . benign moles removed    . CATARACT EXTRACTION W/ INTRAOCULAR LENS IMPLANT Bilateral    right - Nov '11, left - Nov '13 [place]  . HEMORRHOID SURGERY    . MANDIBLE SURGERY    . sebaceous cyst excised     Family History  Problem Relation Age of Onset  . Dementia Mother   . Heart disease Father   . Cancer Sister        breast  . Heart disease Other   . Heart disease Other   . Heart disease Other   . Cancer Sister        breast  . Hypothyroidism Sister   . Asthma Sister     . Rheum arthritis Paternal Grandmother   . Rheum arthritis Daughter    Social History   Socioeconomic History  . Marital status: Married    Spouse name: Not on file  . Number of children: 4  . Years of education: Not on file  . Highest education level: Not on file  Social Needs  . Financial resource strain: Not on file  . Food insecurity - worry: Not on file  . Food insecurity - inability: Not on file  . Transportation needs - medical: Not on file  . Transportation needs - non-medical: Not on file  Occupational History  . Occupation: retired  Tobacco Use  . Smoking status: Former Smoker    Packs/day: 0.10    Years: 1.00    Pack years: 0.10    Types: Cigarettes    Last attempt to quit: 12/12/1962    Years since quitting: 55.1  . Smokeless tobacco: Never Used  . Tobacco comment: smoked for 3 months in college   Substance and Sexual Activity  . Alcohol use: Yes    Comment: rarely  . Drug use: No  . Sexual activity: Yes    Partners: Male  Other Topics Concern  . Not on file  Social History Narrative   College grad. Married '61. 4 daughters, 2 grandchildren. 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   Advanced Directives: Has Living Will, HCPOA: [name] [phone]         Pt lives in 2 story home - her husband currently resides in nursing facility    Outpatient Encounter Medications as of 01/19/2018  Medication Sig  . Acetaminophen (TYLENOL PO) Take by mouth as needed.  . AZO-CRANBERRY PO Take by mouth as needed.  . dorzolamide-timolol (COSOPT) 22.3-6.8 MG/ML ophthalmic solution dorzolamide 22.3 mg-timolol 6.8 mg/mL eye drops  . EPINEPHrine (EPIPEN) 0.3 mg/0.3 mL SOAJ injection Inject 0.3 mg into the muscle once as needed (for allergic reaction).  [name] estradiol (ESTRACE) 1 MG tablet Take 0.5 mg by mouth at bedtime.   . fluticasone (FLONASE) 50 MCG/ACT nasal spray USE 2 SPRAYS INTO BOTH NOSTRILS DAILY.  . hydrochlorothiazide (HYDRODIURIL) 12.5 MG tablet TAKE 1/2  TABLET BY MOUTH EVERY DAY  . Multiple Vitamin (MULTIVITAMIN) capsule Take 1 capsule by mouth daily.  [name] OVER THE COUNTER MEDICATION Take 1 packet by mouth daily. Dissolve one packet in a glass of water each morning. Emergen-C  . progesterone (PROMETRIUM) 100 MG capsule Take 100 mg by mouth at bedtime.   . sertraline (ZOLOFT) 25 MG tablet  Take 25 mg by mouth daily.  . vitamin B-12 (CYANOCOBALAMIN) 1000 MCG tablet Take 1,000 mcg by mouth daily.   No facility-administered encounter medications on file as of 01/19/2018.     Activities of Daily Living In your present state of health, do you have any difficulty performing the following activities: 01/19/2018  Hearing? N  Vision? N  Difficulty concentrating or making decisions? N  Walking or climbing stairs? N  Dressing or bathing? N  Doing errands, shopping? N  Preparing Food and eating ? N  Using the Toilet? N  In the past six months, have you accidently leaked urine? N  Do you have problems with loss of bowel control? N  Managing your Medications? N  Managing your Finances? Y  Housekeeping or managing your Housekeeping? N  Some recent data might be hidden    Patient Care Team: [name], DO as PCP - General (Family Medicine)    Assessment:   This is a routine wellness examination for [place].  Exercise Activities and Dietary recommendations  Current Exercise Habits: Home exercise routine, Type of exercise: walking, Time (Minutes): 40, Frequency (Times/Week): 4, Weekly Exercise (Minutes/Week): 160, Intensity: Moderate  Goals    . Patient Stated     Continue to enjoy your reprieve and good feelings  Enjoying your life!       Fall Risk Fall Risk  01/19/2018 12/18/2017 01/10/2017 12/24/2015 12/24/2015  Falls in the past year? No No No No No    Depression - is coming to grips with her spouse not coming home Understands that her depression last year was related her trying to care for him. Verbalizes that he seems satisfied in the  home and that this is the best place for him.   Depression Screen PHQ 2/9 Scores 01/19/2018 01/11/2018 10/09/2017 09/26/2017  PHQ - 2 Score 0 1 0 1  PHQ- 9 Score - 5 0 7    See cognitive note below   Cognitive Function MMSE - Mini Mental State Exam 01/19/2018 12/21/2017 09/26/2017 01/10/2017  Not completed: (No Data) - - (No Data)  Orientation to time - 4 3 -  Orientation to Place - 4 5 -  Registration - 3 3 -  Attention/ Calculation - 5 5 -  Recall - 3 3 -  Language- name 2 objects - 2 2 -  Language- repeat - 1 1 -  Language- follow 3 step command - 3 3 -  Language- read & follow direction - 1 1 -  Write a sentence - 1 1 -  Copy design - 1 1 -  Total score - 28 28 -     discussed memory and is getting brighter; weight up and BMI 24 and stable; cooking again.  States she is getting more sleep and getting out walking every day  Listening o audio tapes of series she enjoyed when younger  Was seeing [name] as well and has her as a support person if needed again.  Will decline repeat of MMSE Dtr  is paying bills but she feels she would like to oversee this again and will speak with her dtr  Immunization History  Administered Date(s) Administered  . Influenza Split 08/12/2012  . Influenza Whole 09/12/2009  . Influenza, High Dose Seasonal PF 08/24/2013, 09/04/2015, 08/29/2016, 08/24/2017  . Influenza-Unspecified 08/12/2012, 09/28/2014  . Pneumococcal Conjugate-13 10/31/2014  . Pneumococcal-Unspecified 08/24/2013  . Td 05/14/2007  . Tdap 10/31/2014  . Zoster 09/24/2013      Screening Tests Health  Maintenance  Topic Date Due  . DEXA SCAN  01/17/2003  . MAMMOGRAM  04/10/2018 (Originally 11/28/2016)  . TETANUS/TDAP  10/31/2024  . INFLUENZA VACCINE  Completed  . PNA vac Low Risk Adult  Completed          Plan:      PCP Notes   Health Maintenance Has mammograms at [facility] this past week  Dexa still to be done but [name] to [facility] order [facility] for  dexa ordered by [name] / completed sh  Educated regarding shingles vaccine    Abnormal Screens  BP slightly elevated but had sausage biscuit prior to coming in   Referrals  None   Patient concerns; States she is doing well Waived MMSE; states she is sleeping better, has gained weight; BMI 24 and holding. Verbalizes understand that spouse was in the nursing home and this is where he needs to be and an understanding of why she could not continue to care for him. Stating to walk at home, listen to tapes she enjoys.  82th birthday celebrated with her dtr's  Followed by [facility] but will see [name] as needed     Nurse Concerns; As noted   Next PCP apt TBS   I have personally reviewed and noted the following in the patient's chart:   . Medical and social history . Use of alcohol, tobacco or illicit drugs  . Current medications and supplements . Functional ability and status . Nutritional status . Physical activity . Advanced directives . List of other physicians . Hospitalizations, surgeries, and ER visits in previous 12 months . Vitals . Screenings to include cognitive, depression, and falls . Referrals and appointments  In addition, I have reviewed and discussed with patient certain preventive protocols, quality metrics, and best practice recommendations. A written personalized care plan for preventive services as well as general preventive health recommendations were provided to patient.     HQPRF,FMBWG, RN  01/19/2018  Above notes reviewed in absence of primary provider.  Agree with assessment and plan as above.  Eulas Post MD [facility] at [facility]

## 2018-01-19 NOTE — Patient Instructions (Addendum)
[name] , Thank you for taking time to come for your Medicare Wellness Visit. I appreciate your ongoing commitment to your health goals. Please review the following plan we discussed and let me know if I can assist you in the future.   [name] to send order for dexa to [facility] to get this complete   Shingrix is a vaccine for the prevention of Shingles in Adults 50 and older.  If you are on Medicare, you can request a prescription from your doctor to be filled at a pharmacy.  Please check with your benefits regarding applicable copays or out of pocket expenses.  The Shingrix is given in 2 vaccines approx 8 weeks apart. You must receive the 2nd dose prior to 6 months from receipt of the first.    These are the goals we discussed: Goals    . Patient Stated     Continue to enjoy your reprieve and good feelings  Enjoying your life!       This is a list of the screening recommended for you and due dates:  Health Maintenance  Topic Date Due  . DEXA scan (bone density measurement)  01/17/2003  . Mammogram  04/10/2018*  . Tetanus Vaccine  10/31/2024  . Flu Shot  Completed  . Pneumonia vaccines  Completed  *Topic was postponed. The date shown is not the original due date.      Fat and Cholesterol Restricted Diet Getting too much fat and cholesterol in your diet may cause health problems. Following this diet helps keep your fat and cholesterol at normal levels. This can keep you from getting sick. What types of fat should I choose?  Choose monosaturated and polyunsaturated fats. These are found in foods such as olive oil, canola oil, flaxseeds, walnuts, almonds, and seeds.  Eat more omega-3 fats. Good choices include salmon, mackerel, sardines, tuna, flaxseed oil, and ground flaxseeds.  Limit saturated fats. These are in animal products such as meats, butter, and cream. They can also be in plant products such as palm oil, palm kernel oil, and coconut oil.  Avoid foods with partially  hydrogenated oils in them. These contain trans fats. Examples of foods that have trans fats are stick margarine, some tub margarines, cookies, crackers, and other baked goods. What general guidelines do I need to follow?  Check food labels. Look for the words "trans fat" and "saturated fat."  When preparing a meal: ? Fill half of your plate with vegetables and green salads. ? Fill one fourth of your plate with whole grains. Look for the word "whole" as the first word in the ingredient list. ? Fill one fourth of your plate with lean protein foods.  Eat more foods that have fiber, like apples, carrots, beans, peas, and barley.  Eat more home-cooked foods. Eat less at restaurants and buffets.  Limit or avoid alcohol.  Limit foods high in starch and sugar.  Limit fried foods.  Cook foods without frying them. Baking, boiling, grilling, and broiling are all great options.  Lose weight if you are overweight. Losing even a small amount of weight can help your overall health. It can also help prevent diseases such as diabetes and heart disease. What foods can I eat? Grains Whole grains, such as whole wheat or whole grain breads, crackers, cereals, and pasta. Unsweetened oatmeal, bulgur, barley, quinoa, or brown rice. Corn or whole wheat flour tortillas. Vegetables Fresh or frozen vegetables (raw, steamed, roasted, or grilled). Green salads. Fruits All fresh, canned (in  natural juice), or frozen fruits. Meat and Other Protein Products Ground beef (85% or leaner), grass-fed beef, or beef trimmed of fat. Skinless chicken or [place]. Ground chicken or [place]. Pork trimmed of fat. All fish and seafood. Eggs. Dried beans, peas, or lentils. Unsalted nuts or seeds. Unsalted canned or dry beans. Dairy Low-fat dairy products, such as skim or 1% milk, 2% or reduced-fat cheeses, low-fat ricotta or cottage cheese, or plain low-fat yogurt. Fats and Oils Tub margarines without trans fats. Light or  reduced-fat mayonnaise and salad dressings. Avocado. Olive, canola, sesame, or safflower oils. Natural peanut or almond butter (choose ones without added sugar and oil). The items listed above may not be a complete list of recommended foods or beverages. Contact your dietitian for more options. What foods are not recommended? Grains White bread. White pasta. White rice. Cornbread. Bagels, pastries, and croissants. Crackers that contain trans fat. Vegetables White potatoes. Corn. Creamed or fried vegetables. Vegetables in a cheese sauce. Fruits Dried fruits. Canned fruit in light or heavy syrup. Fruit juice. Meat and Other Protein Products Fatty cuts of meat. Ribs, chicken wings, bacon, sausage, bologna, salami, chitterlings, fatback, hot dogs, bratwurst, and packaged luncheon meats. Liver and organ meats. Dairy Whole or 2% milk, cream, half-and-half, and cream cheese. Whole milk cheeses. Whole-fat or sweetened yogurt. Full-fat cheeses. Nondairy creamers and whipped toppings. Processed cheese, cheese spreads, or cheese curds. Sweets and Desserts Corn syrup, sugars, honey, and molasses. Candy. Jam and jelly. Syrup. Sweetened cereals. Cookies, pies, cakes, donuts, muffins, and ice cream. Fats and Oils Butter, stick margarine, lard, shortening, ghee, or bacon fat. Coconut, palm kernel, or palm oils. Beverages Alcohol. Sweetened drinks (such as sodas, lemonade, and fruit drinks or punches). The items listed above may not be a complete list of foods and beverages to avoid. Contact your dietitian for more information. This information is not intended to replace advice given to you by your health care provider. Make sure you discuss any questions you have with your health care provider. Document Released: 05/29/2012 Document Revised: 08/04/2016 Document Reviewed: 02/27/2014 Elsevier Interactive Patient Education  2018 [place] in the Home Falls can cause injuries. They can  happen to people of all ages. There are many things you can do to make your home safe and to help prevent falls. What can I do on the outside of my home?  Regularly fix the edges of walkways and driveways and fix any cracks.  Remove anything that might make you trip as you walk through a door, such as a raised step or threshold.  Trim any bushes or trees on the path to your home.  Use bright outdoor lighting.  Clear any walking paths of anything that might make someone trip, such as rocks or tools.  Regularly check to see if handrails are loose or broken. Make sure that both sides of any steps have handrails.  Any raised decks and porches should have guardrails on the edges.  Have any leaves, snow, or ice cleared regularly.  Use sand or salt on walking paths during winter.  Clean up any spills in your garage right away. This includes oil or grease spills. What can I do in the bathroom?  Use night lights.  Install grab bars by the toilet and in the tub and shower. Do not use towel bars as grab bars.  Use non-skid mats or decals in the tub or shower.  If you need to sit down in the shower, use a  plastic, non-slip stool.  Keep the floor dry. Clean up any water that spills on the floor as soon as it happens.  Remove soap buildup in the tub or shower regularly.  Attach bath mats securely with double-sided non-slip rug tape.  Do not have throw rugs and other things on the floor that can make you trip. What can I do in the bedroom?  Use night lights.  Make sure that you have a light by your bed that is easy to reach.  Do not use any sheets or blankets that are too big for your bed. They should not hang down onto the floor.  Have a firm chair that has side arms. You can use this for support while you get dressed.  Do not have throw rugs and other things on the floor that can make you trip. What can I do in the kitchen?  Clean up any spills right away.  Avoid walking on  wet floors.  Keep items that you use a lot in easy-to-reach places.  If you need to reach something above you, use a strong step stool that has a grab bar.  Keep electrical cords out of the way.  Do not use floor polish or wax that makes floors slippery. If you must use wax, use non-skid floor wax.  Do not have throw rugs and other things on the floor that can make you trip. What can I do with my stairs?  Do not leave any items on the stairs.  Make sure that there are handrails on both sides of the stairs and use them. Fix handrails that are broken or loose. Make sure that handrails are as long as the stairways.  Check any carpeting to make sure that it is firmly attached to the stairs. Fix any carpet that is loose or worn.  Avoid having throw rugs at the top or bottom of the stairs. If you do have throw rugs, attach them to the floor with carpet tape.  Make sure that you have a light switch at the top of the stairs and the bottom of the stairs. If you do not have them, ask someone to add them for you. What else can I do to help prevent falls?  Wear shoes that: ? Do not have high heels. ? Have rubber bottoms. ? Are comfortable and fit you well. ? Are closed at the toe. Do not wear sandals.  If you use a stepladder: ? Make sure that it is fully opened. Do not climb a closed stepladder. ? Make sure that both sides of the stepladder are locked into place. ? Ask someone to hold it for you, if possible.  Clearly mark and make sure that you can see: ? Any grab bars or handrails. ? First and last steps. ? Where the edge of each step is.  Use tools that help you move around (mobility aids) if they are needed. These include: ? Canes. ? Walkers. ? Scooters. ? Crutches.  Turn on the lights when you go into a dark area. Replace any light bulbs as soon as they burn out.  Set up your furniture so you have a clear path. Avoid moving your furniture around.  If any of your floors are  uneven, fix them.  If there are any pets around you, be aware of where they are.  Review your medicines with your doctor. Some medicines can make you feel dizzy. This can increase your chance of falling. Ask your doctor what other  things that you can do to help prevent falls. This information is not intended to replace advice given to you by your health care provider. Make sure you discuss any questions you have with your health care provider. Document Released: 09/24/2009 Document Revised: 05/05/2016 Document Reviewed: 01/02/2015 Elsevier Interactive Patient Education  2018 [place] Maintenance, Female Adopting a healthy lifestyle and getting preventive care can go a long way to promote health and wellness. Talk with your health care provider about what schedule of regular examinations is right for you. This is a good chance for you to check in with your provider about disease prevention and staying healthy. In between checkups, there are plenty of things you can do on your own. Experts have done a lot of research about which lifestyle changes and preventive measures are most likely to keep you healthy. Ask your health care provider for more information. Weight and diet Eat a healthy diet  Be sure to include plenty of vegetables, fruits, low-fat dairy products, and lean protein.  Do not eat a lot of foods high in solid fats, added sugars, or salt.  Get regular exercise. This is one of the most important things you can do for your health. ? Most adults should exercise for at least 150 minutes each week. The exercise should increase your heart rate and make you sweat (moderate-intensity exercise). ? Most adults should also do strengthening exercises at least twice a week. This is in addition to the moderate-intensity exercise.  Maintain a healthy weight  Body mass index (BMI) is a measurement that can be used to identify possible weight problems. It estimates body fat based on  height and weight. Your health care provider can help determine your BMI and help you achieve or maintain a healthy weight.  For females 80 years of age and older: ? A BMI below 18.5 is considered underweight. ? A BMI of 18.5 to 24.9 is normal. ? A BMI of 25 to 29.9 is considered overweight. ? A BMI of 30 and above is considered obese.  Watch levels of cholesterol and blood lipids  You should start having your blood tested for lipids and cholesterol at 80 years of age, then have this test every 5 years.  You may need to have your cholesterol levels checked more often if: ? Your lipid or cholesterol levels are high. ? You are older than 80 years of age. ? You are at high risk for heart disease.  Cancer screening Lung Cancer  Lung cancer screening is recommended for adults 66-9 years old who are at high risk for lung cancer because of a history of smoking.  A yearly low-dose CT scan of the lungs is recommended for people who: ? Currently smoke. ? Have quit within the past 15 years. ? Have at least a 30-pack-year history of smoking. A pack year is smoking an average of one pack of cigarettes a day for 1 year.  Yearly screening should continue until it has been 15 years since you quit.  Yearly screening should stop if you develop a health problem that would prevent you from having lung cancer treatment.  Breast Cancer  Practice breast self-awareness. This means understanding how your breasts normally appear and feel.  It also means doing regular breast self-exams. Let your health care provider know about any changes, no matter how small.  If you are in your 20s or 30s, you should have a clinical breast exam (CBE) by a health care provider every  1-3 years as part of a regular health exam.  If you are 107 or older, have a CBE every year. Also consider having a breast X-ray (mammogram) every year.  If you have a family history of breast cancer, talk to your health care provider  about genetic screening.  If you are at high risk for breast cancer, talk to your health care provider about having an MRI and a mammogram every year.  Breast cancer gene (BRCA) assessment is recommended for women who have family members with BRCA-related cancers. BRCA-related cancers include: ? Breast. ? Ovarian. ? Tubal. ? Peritoneal cancers.  Results of the assessment will determine the need for genetic counseling and BRCA1 and BRCA2 testing.  Cervical Cancer Your health care provider may recommend that you be screened regularly for cancer of the pelvic organs (ovaries, uterus, and vagina). This screening involves a pelvic examination, including checking for microscopic changes to the surface of your cervix (Pap test). You may be encouraged to have this screening done every 3 years, beginning at age 51.  For women ages 106-65, health care providers may recommend pelvic exams and Pap testing every 3 years, or they may recommend the Pap and pelvic exam, combined with testing for human papilloma virus (HPV), every 5 years. Some types of HPV increase your risk of cervical cancer. Testing for HPV may also be done on women of any age with unclear Pap test results.  Other health care providers may not recommend any screening for nonpregnant women who are considered low risk for pelvic cancer and who do not have symptoms. Ask your health care provider if a screening pelvic exam is right for you.  If you have had past treatment for cervical cancer or a condition that could lead to cancer, you need Pap tests and screening for cancer for at least 20 years after your treatment. If Pap tests have been discontinued, your risk factors (such as having a new sexual partner) need to be reassessed to determine if screening should resume. Some women have medical problems that increase the chance of getting cervical cancer. In these cases, your health care provider may recommend more frequent screening and Pap  tests.  Colorectal Cancer  This type of cancer can be detected and often prevented.  Routine colorectal cancer screening usually begins at 80 years of age and continues through 80 years of age.  Your health care provider may recommend screening at an earlier age if you have risk factors for colon cancer.  Your health care provider may also recommend using home test kits to check for hidden blood in the stool.  A small camera at the end of a tube can be used to examine your colon directly (sigmoidoscopy or colonoscopy). This is done to check for the earliest forms of colorectal cancer.  Routine screening usually begins at age 26.  Direct examination of the colon should be repeated every 5-10 years through 80 years of age. However, you may need to be screened more often if early forms of precancerous polyps or small growths are found.  Skin Cancer  Check your skin from head to toe regularly.  Tell your health care provider about any new moles or changes in moles, especially if there is a change in a mole's shape or color.  Also tell your health care provider if you have a mole that is larger than the size of a pencil eraser.  Always use sunscreen. Apply sunscreen liberally and repeatedly throughout the day.  Protect  yourself by wearing long sleeves, pants, a wide-brimmed hat, and sunglasses whenever you are outside.  Heart disease, diabetes, and high blood pressure  High blood pressure causes heart disease and increases the risk of stroke. High blood pressure is more likely to develop in: ? People who have blood pressure in the high end of the normal range (130-139/85-89 mm Hg). ? People who are overweight or obese. ? People who are African American.  If you are 35-16 years of age, have your blood pressure checked every 3-5 years. If you are 55 years of age or older, have your blood pressure checked every year. You should have your blood pressure measured twice-once when you are at  a hospital or clinic, and once when you are not at a hospital or clinic. Record the average of the two measurements. To check your blood pressure when you are not at a hospital or clinic, you can use: ? An automated blood pressure machine at a pharmacy. ? A home blood pressure monitor.  If you are between 83 years and 70 years old, ask your health care provider if you should take aspirin to prevent strokes.  Have regular diabetes screenings. This involves taking a blood sample to check your fasting blood sugar level. ? If you are at a normal weight and have a low risk for diabetes, have this test once every three years after 80 years of age. ? If you are overweight and have a high risk for diabetes, consider being tested at a younger age or more often. Preventing infection Hepatitis B  If you have a higher risk for hepatitis B, you should be screened for this virus. You are considered at high risk for hepatitis B if: ? You were born in a country where hepatitis B is common. Ask your health care provider which countries are considered high risk. ? Your parents were born in a high-risk country, and you have not been immunized against hepatitis B (hepatitis B vaccine). ? You have HIV or AIDS. ? You use needles to inject street drugs. ? You live with someone who has hepatitis B. ? You have had sex with someone who has hepatitis B. ? You get hemodialysis treatment. ? You take certain medicines for conditions, including cancer, organ transplantation, and autoimmune conditions.  Hepatitis C  Blood testing is recommended for: ? Everyone born from [birth year] through 1963. ? Anyone with known risk factors for hepatitis C.  Sexually transmitted infections (STIs)  You should be screened for sexually transmitted infections (STIs) including gonorrhea and chlamydia if: ? You are sexually active and are younger than 80 years of age. ? You are older than 80 years of age and your health care provider tells  you that you are at risk for this type of infection. ? Your sexual activity has changed since you were last screened and you are at an increased risk for chlamydia or gonorrhea. Ask your health care provider if you are at risk.  If you do not have HIV, but are at risk, it may be recommended that you take a prescription medicine daily to prevent HIV infection. This is called pre-exposure prophylaxis (PrEP). You are considered at risk if: ? You are sexually active and do not regularly use condoms or know the HIV status of your partner(s). ? You take drugs by injection. ? You are sexually active with a partner who has HIV.  Talk with your health care provider about whether you are at high risk of being  infected with HIV. If you choose to begin PrEP, you should first be tested for HIV. You should then be tested every 3 months for as long as you are taking PrEP. Pregnancy  If you are premenopausal and you may become pregnant, ask your health care provider about preconception counseling.  If you may become pregnant, take 400 to 800 micrograms (mcg) of folic acid every day.  If you want to prevent pregnancy, talk to your health care provider about birth control (contraception). Osteoporosis and menopause  Osteoporosis is a disease in which the bones lose minerals and strength with aging. This can result in serious bone fractures. Your risk for osteoporosis can be identified using a bone density scan.  If you are 68 years of age or older, or if you are at risk for osteoporosis and fractures, ask your health care provider if you should be screened.  Ask your health care provider whether you should take a calcium or vitamin D supplement to lower your risk for osteoporosis.  Menopause may have certain physical symptoms and risks.  Hormone replacement therapy may reduce some of these symptoms and risks. Talk to your health care provider about whether hormone replacement therapy is right for  you. Follow these instructions at home:  Schedule regular health, dental, and eye exams.  Stay current with your immunizations.  Do not use any tobacco products including cigarettes, chewing tobacco, or electronic cigarettes.  If you are pregnant, do not drink alcohol.  If you are breastfeeding, limit how much and how often you drink alcohol.  Limit alcohol intake to no more than 1 drink per day for nonpregnant women. One drink equals 12 ounces of beer, 5 ounces of wine, or 1 ounces of hard liquor.  Do not use street drugs.  Do not share needles.  Ask your health care provider for help if you need support or information about quitting drugs.  Tell your health care provider if you often feel depressed.  Tell your health care provider if you have ever been abused or do not feel safe at home. This information is not intended to replace advice given to you by your health care provider. Make sure you discuss any questions you have with your health care provider. Document Released: 06/13/2011 Document Revised: 05/05/2016 Document Reviewed: 09/01/2015 Elsevier Interactive Patient Education  [employer].

## 2018-01-22 ENCOUNTER — Ambulatory Visit: Payer: Self-pay | Admitting: Psychology

## 2018-01-30 DIAGNOSIS — F410 Panic disorder [episodic paroxysmal anxiety] without agoraphobia: Secondary | ICD-10-CM | POA: Diagnosis not present

## 2018-02-05 ENCOUNTER — Ambulatory Visit: Payer: Self-pay | Admitting: Psychology

## 2018-02-19 ENCOUNTER — Ambulatory Visit: Payer: Self-pay | Admitting: Psychology

## 2018-03-05 ENCOUNTER — Ambulatory Visit: Payer: Self-pay | Admitting: Psychology

## 2018-03-05 DIAGNOSIS — F410 Panic disorder [episodic paroxysmal anxiety] without agoraphobia: Secondary | ICD-10-CM | POA: Diagnosis not present

## 2018-03-12 ENCOUNTER — Other Ambulatory Visit: Payer: Self-pay | Admitting: Family Medicine

## 2018-03-12 DIAGNOSIS — I10 Essential (primary) hypertension: Secondary | ICD-10-CM

## 2018-03-19 ENCOUNTER — Ambulatory Visit: Payer: Self-pay | Admitting: Psychology

## 2018-04-02 ENCOUNTER — Ambulatory Visit: Payer: Self-pay | Admitting: Psychology

## 2018-04-13 ENCOUNTER — Other Ambulatory Visit: Payer: Self-pay | Admitting: Family Medicine

## 2018-04-13 DIAGNOSIS — J309 Allergic rhinitis, unspecified: Secondary | ICD-10-CM

## 2018-04-13 DIAGNOSIS — H6983 Other specified disorders of Eustachian tube, bilateral: Secondary | ICD-10-CM

## 2018-04-16 ENCOUNTER — Ambulatory Visit: Payer: Self-pay | Admitting: Psychology

## 2018-04-17 DIAGNOSIS — F410 Panic disorder [episodic paroxysmal anxiety] without agoraphobia: Secondary | ICD-10-CM | POA: Diagnosis not present

## 2018-04-30 ENCOUNTER — Ambulatory Visit: Payer: Self-pay | Admitting: Psychology

## 2018-05-29 DIAGNOSIS — F410 Panic disorder [episodic paroxysmal anxiety] without agoraphobia: Secondary | ICD-10-CM | POA: Diagnosis not present

## 2018-05-31 DIAGNOSIS — H401233 Low-tension glaucoma, bilateral, severe stage: Secondary | ICD-10-CM | POA: Diagnosis not present

## 2018-06-08 ENCOUNTER — Other Ambulatory Visit: Payer: Self-pay | Admitting: Family Medicine

## 2018-06-08 DIAGNOSIS — I10 Essential (primary) hypertension: Secondary | ICD-10-CM

## 2018-06-19 ENCOUNTER — Encounter: Payer: Self-pay | Admitting: Family Medicine

## 2018-06-19 ENCOUNTER — Encounter: Payer: Self-pay | Admitting: Neurology

## 2018-06-19 ENCOUNTER — Ambulatory Visit (INDEPENDENT_AMBULATORY_CARE_PROVIDER_SITE_OTHER): Payer: Medicare Other | Admitting: Neurology

## 2018-06-19 ENCOUNTER — Other Ambulatory Visit: Payer: Self-pay

## 2018-06-19 VITALS — BP 112/78 | HR 54 | Ht 63.0 in | Wt 137.0 lb

## 2018-06-19 DIAGNOSIS — F419 Anxiety disorder, unspecified: Secondary | ICD-10-CM | POA: Diagnosis not present

## 2018-06-19 DIAGNOSIS — R413 Other amnesia: Secondary | ICD-10-CM

## 2018-06-19 NOTE — Progress Notes (Signed)
NEUROLOGY FOLLOW UP OFFICE NOTE  [name] [id number] 28-Oct-1936  HISTORY OF PRESENT ILLNESS: I had the pleasure of seeing [name] in follow-up in the neurology clinic on 06/19/2018.  The patient was last seen 6 months ago for memory loss. She is again accompanied by one of her daughters who helps supplement the history today.  Records and images were personally reviewed where available. MMSE in January 2019 was normal 28/30.  I personally reviewed MRI brain without contrast done 12/2017 which did not show any acute changes, there was mild diffuse atrophy and mild chronic microvascular disease. TSH and B12 normal. She was very anxious on her initial visit and is noted to be calmer today. She has switched from Lexapro to Sertraline, and reports symptoms are better but still not 100%. Her daughter is getting frustrated because they have been working on Sertraline dosing/timing since January and have still not found a good balance. She is very sensitive to side effects. Her daughter feels she is slow to get up in the morning, lethargic and more confused. She feels more anxious in the morning and feels incompetent. She feels her memory is okay, there are a few times she cannot remember what she just did. She denies misplacing things but her daughter nods behind her. She denies getting lost driving. Her daughter manages finances and fixes her pillbox. She has an auto-pill dispenser but a couple of times her daughter has noticed a pill in the wrong place. She continues to live alone, no hygiene issues. One of her cats has diabetes, she had difficulty managing insulin shots and would not remember if she fed him already. Her daughter reports she is pretty good with writing notes around the house, but sometimes would wonder what a note was fro. She is noted to have a left hand resting tremor, her daughter reports she has more hand tremors in either hand when nervous.    HPI 12/18/2017: This is a pleasant 82 yo  RH woman with a history of hypertension, anxiety, depression, who presented for evaluation of memory loss. She feels her memory is "a little shaky." She endorses a lot of anxiety, and states it is causing her not to recall things and she does not understand it. Family started noticing memory changes around 2 years ago. She has been the main caregiver for her husband with dementia, who had been in and out of the hospital several times. She was "not dealing with things as well as I should." For instance, 2 years ago she was taking care of the managing their family home, but they noticed she was not keeping up with it, and turned it over to her sister. Her daughter took over finances in the Spring 2018 because she was afraid she would forget and she had never done the taxes in the past. She lives alone and denies missing medications. She denies getting lost driving. She has 3 cats at home that are cared for, she does not forget to feed them. She has no difficulties running the household, doing laundry and yardwork. She has learned how to use the leafblower. Their main concern is there anxiety and depression have "really ramped up." She is so stressed and scattered, that she could not stay on task. Family started noticing anxiety around 4-5 years ago, but in January 2017 she was "in breakdown territory" and started Lexapro and counseling. It appears she was on a very low dose due to concern for side effects. She would wake  up every morning between 3-4 AM with a panic attack, unable to reason with herself, shaky. Family reports that she would forget what time they told her she would be picked up, even if it was in a text message in front of her. They have noticed lack of focus and concentration. She would ask the same question about this doctor's appointment and was quite anxious about today's visit. Family reports she "tends to catastrophize things." No paranoia or hallucinations. She was switched from Lexapro to Prozac  at the family's request, and "something about Prozac bothers me all in my head."   She denies any headaches, vertigo, diplopia, dysarthria/dysphagia, neck/back pain, focal numbness/tingling/weakness, bowel/bladder dysfunction. She reports a "mental dizziness," where she states "my brain just feels full." She has tremors, R>L. She had lost her sense of smell after sinus surgery many years ago. Her mother had dementia in her 21s, her older sister has memory issues. No history of significant head injuries, no alcohol use.    PAST MEDICAL HISTORY: Past Medical History:  Diagnosis Date  . Allergic rhinitis, cause unspecified   . Contact dermatitis 03/16/2013  . Generalized anxiety disorder 02/19/2013   has seen [name], [name] and now [name] for counseling  . Hemorrhoids, external   . Hot flashes   . Hypertension   . Irritable bowel syndrome   . MDD (major depressive disorder) 03/18/2016  . Other and unspecified hyperlipidemia   . Palpitations 02/19/2013   On-set of symptoms March '14. EKG with NSR     MEDICATIONS: Current Outpatient Medications on File Prior to Visit  Medication Sig Dispense Refill  . Acetaminophen (TYLENOL PO) Take by mouth as needed.    . AZO-CRANBERRY PO Take by mouth as needed.    . dorzolamide-timolol (COSOPT) 22.3-6.8 MG/ML ophthalmic solution dorzolamide 22.3 mg-timolol 6.8 mg/mL eye drops    . EPINEPHrine (EPIPEN) 0.3 mg/0.3 mL SOAJ injection Inject 0.3 mg into the muscle once as needed (for allergic reaction).    [name] estradiol (ESTRACE) 1 MG tablet Take 0.5 mg by mouth at bedtime.     . fluticasone (FLONASE) 50 MCG/ACT nasal spray USE 2 SPRAYS INTO BOTH NOSTRILS DAILY. 16 g 3  . hydrochlorothiazide (HYDRODIURIL) 12.5 MG tablet TAKE 1/2 TABLET BY MOUTH EVERY DAY 45 tablet 0  . Multiple Vitamin (MULTIVITAMIN) capsule Take 1 capsule by mouth daily.    [name] OVER THE COUNTER MEDICATION Take 1 packet by mouth daily. Dissolve one packet in a glass of water each  morning. Emergen-C    . progesterone (PROMETRIUM) 100 MG capsule Take 100 mg by mouth at bedtime.     . sertraline (ZOLOFT) 25 MG tablet Take 25 mg by mouth daily.    . vitamin B-12 (CYANOCOBALAMIN) 1000 MCG tablet Take 1,000 mcg by mouth daily.     No current facility-administered medications on file prior to visit.     ALLERGIES: Allergies  Allergen Reactions  . Cephalexin     REACTION: diaphoretic and drop in Blood pressure  . Lorazepam Other (See Comments)    amnesia    FAMILY HISTORY: Family History  Problem Relation Age of Onset  . Dementia Mother   . Heart disease Father   . Cancer Sister        breast  . Heart disease Other   . Heart disease Other   . Heart disease Other   . Cancer Sister        breast  . Hypothyroidism Sister   . Asthma  Sister   . Rheum arthritis Paternal Grandmother   . Rheum arthritis Daughter     SOCIAL HISTORY: Social History   Socioeconomic History  . Marital status: Married    Spouse name: Not on file  . Number of children: 4  . Years of education: Not on file  . Highest education level: Not on file  Occupational History  . Occupation: retired  Scientific laboratory technician  . Financial resource strain: Not on file  . Food insecurity:    Worry: Not on file    Inability: Not on file  . Transportation needs:    Medical: Not on file    Non-medical: Not on file  Tobacco Use  . Smoking status: Former Smoker    Packs/day: 0.10    Years: 1.00    Pack years: 0.10    Types: Cigarettes    Last attempt to quit: 12/12/1962    Years since quitting: 55.5  . Smokeless tobacco: Never Used  . Tobacco comment: smoked for 3 months in college   Substance and Sexual Activity  . Alcohol use: Yes    Comment: rarely  . Drug use: No  . Sexual activity: Yes    Partners: Male  Lifestyle  . Physical activity:    Days per week: Not on file    Minutes per session: Not on file  . Stress: Not on file  Relationships  . Social connections:    Talks on phone:  Not on file    Gets together: Not on file    Attends religious service: Not on file    Active member of club or organization: Not on file    Attends meetings of clubs or organizations: Not on file    Relationship status: Not on file  . Intimate partner violence:    Fear of current or ex partner: Not on file    Emotionally abused: Not on file    Physically abused: Not on file    Forced sexual activity: Not on file  Other Topics Concern  . Not on file  Social History Narrative   College grad. Married '61. 4 daughters, 2 grandchildren. 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   Advanced Directives: Has Living Will, HCPOA: Beckham Buxbaum [phone]         Pt lives in 2 story home - her husband currently resides in nursing facility    REVIEW OF SYSTEMS: Constitutional: No fevers, chills, or sweats, no generalized fatigue, change in appetite Eyes: No visual changes, double vision, eye pain Ear, nose and throat: No hearing loss, ear pain, nasal congestion, sore throat Cardiovascular: No chest pain, palpitations Respiratory:  No shortness of breath at rest or with exertion, wheezes GastrointestinaI: No nausea, vomiting, diarrhea, abdominal pain, fecal incontinence Genitourinary:  No dysuria, urinary retention or frequency Musculoskeletal:  No neck pain, back pain Integumentary: No rash, pruritus, skin lesions Neurological: as above Psychiatric: No depression, insomnia, anxiety Endocrine: No palpitations, fatigue, diaphoresis, mood swings, change in appetite, change in weight, increased thirst Hematologic/Lymphatic:  No anemia, purpura, petechiae. Allergic/Immunologic: no itchy/runny eyes, nasal congestion, recent allergic reactions, rashes  PHYSICAL EXAM: Vitals:   06/19/18 1517  BP: 112/78  Pulse: (!) 54  SpO2: 97%   General: No acute distress, flat affect, less anxious today Head:  Normocephalic/atraumatic Neck: supple, no paraspinal tenderness, full range of  motion Heart:  Regular rate and rhythm Lungs:  Clear to auscultation bilaterally Back: No paraspinal tenderness Skin/Extremities: No rash, no edema Neurological Exam: alert and  oriented to person, place, and time. No aphasia or dysarthria. Fund of knowledge is appropriate.  Remote memory intact.  Attention and concentration are normal.    Able to name objects and repeat phrases. CDT 4/5 MMSE - Mini Mental State Exam 06/19/2018 01/19/2018 12/21/2017  Not completed: - (No Data) -  Orientation to time 5 - 4  Orientation to Place 5 - 4  Registration 3 - 3  Attention/ Calculation 5 - 5  Recall 0 - 3  Language- name 2 objects 2 - 2  Language- repeat 1 - 1  Language- follow 3 step command 3 - 3  Language- read & follow direction 1 - 1  Write a sentence 1 - 1  Copy design 1 - 1  Total score 27 - 28   Cranial nerves: Pupils equal, round, reactive to light.  Extraocular movements intact with no nystagmus. Visual fields full. Facial sensation intact. No facial asymmetry. Tongue, uvula, palate midline.  Motor: Bulk and tone normal but possibly slightly increased tone on left wrist. Muscle strength 5/5 throughout with no pronator drift.  Sensation to light touch intact.  No extinction to double simultaneous stimulation.  Deep tendon reflexes 2+ throughout, toes downgoing.  Finger to nose testing intact.  Gait narrow-based and steady, able to tandem walk adequately.  Romberg negative. She is noted to have occasional left hand resting tremor, no postural or action tremor. She has occasional right arm jerking, noted on prior visit.  IMPRESSION: This is a 82 yo RH woman with a history of hypertension, anxiety, depression, who presented for evaluation of worsening memory. On her last visit, she was noted to be very anxious and we had discussed how anxiety can cause cognitive issues. She continues to work on anxiety, now on Sertraline which has helped some but not enough per daughter. MMSE today 27/30 (28/30 in  January 2019). MRI brain unremarkable. We discussed that at this point since there has been some improvement in anxiety, Neurocognitive testing can be helpful to further evaluate memory issues and how much anxiety plays a role. She has a resting tremor on the left hand and occasional right shoulder jerks. No other parkinsonian signs. Continue to monitor. We again discussed the importance of control of vascular risk factors, physical exercise, and brain stimulation exercises for brain health. She will follow-up after Neurocognitive testing and knows to call for any changes.  Thank you for allowing me to participate in her care.  Please do not hesitate to call for any questions or concerns.  The duration of this appointment visit was 30 minutes of face-to-face time with the patient.  Greater than 50% of this time was spent in counseling, explanation of diagnosis, planning of further management, and coordination of care.   Ellouise Newer, M.D.   CC: [name]

## 2018-06-19 NOTE — Patient Instructions (Signed)
1. Proceed with Neurocognitive testing with Dr. Si Raider 2. Continue with treatment of anxiety 3. Follow-up after Neurocognitive testing   RECOMMENDATIONS FOR ALL PATIENTS WITH MEMORY PROBLEMS: 1. Continue to exercise (Recommend 30 minutes of walking everyday, or 3 hours every week) 2. Increase social interactions - continue going to [place] and enjoy social gatherings with friends and family 3. Eat healthy, avoid fried foods and eat more fruits and vegetables 4. Maintain adequate blood pressure, blood sugar, and blood cholesterol level. Reducing the risk of stroke and cardiovascular disease also helps promoting better memory. 5. Avoid stressful situations. Live a simple life and avoid aggravations. Organize your time and prepare for the next day in anticipation. 6. Sleep well, avoid any interruptions of sleep and avoid any distractions in the bedroom that may interfere with adequate sleep quality 7. Avoid sugar, avoid sweets as there is a strong link between excessive sugar intake, diabetes, and cognitive impairment The Mediterranean diet has been shown to help patients reduce the risk of progressive memory disorders and reduces cardiovascular risk. This includes eating fish, eat fruits and green leafy vegetables, nuts like almonds and hazelnuts, walnuts, and also use olive oil. Avoid fast foods and fried foods as much as possible. Avoid sweets and sugar as sugar use has been linked to worsening of memory function.

## 2018-06-25 ENCOUNTER — Ambulatory Visit (INDEPENDENT_AMBULATORY_CARE_PROVIDER_SITE_OTHER): Payer: Medicare Other | Admitting: Psychology

## 2018-06-25 DIAGNOSIS — F411 Generalized anxiety disorder: Secondary | ICD-10-CM

## 2018-07-23 ENCOUNTER — Ambulatory Visit (INDEPENDENT_AMBULATORY_CARE_PROVIDER_SITE_OTHER): Payer: Medicare Other | Admitting: Psychology

## 2018-07-23 DIAGNOSIS — F411 Generalized anxiety disorder: Secondary | ICD-10-CM

## 2018-07-24 DIAGNOSIS — F410 Panic disorder [episodic paroxysmal anxiety] without agoraphobia: Secondary | ICD-10-CM | POA: Diagnosis not present

## 2018-08-09 ENCOUNTER — Ambulatory Visit (INDEPENDENT_AMBULATORY_CARE_PROVIDER_SITE_OTHER): Payer: Medicare Other | Admitting: Psychology

## 2018-08-09 DIAGNOSIS — F411 Generalized anxiety disorder: Secondary | ICD-10-CM | POA: Diagnosis not present

## 2018-08-23 ENCOUNTER — Ambulatory Visit (INDEPENDENT_AMBULATORY_CARE_PROVIDER_SITE_OTHER): Payer: Medicare Other | Admitting: Psychology

## 2018-08-23 DIAGNOSIS — F411 Generalized anxiety disorder: Secondary | ICD-10-CM

## 2018-09-01 ENCOUNTER — Encounter: Payer: Self-pay | Admitting: *Deleted

## 2018-09-06 ENCOUNTER — Ambulatory Visit (INDEPENDENT_AMBULATORY_CARE_PROVIDER_SITE_OTHER): Payer: Medicare Other | Admitting: Psychology

## 2018-09-06 DIAGNOSIS — F411 Generalized anxiety disorder: Secondary | ICD-10-CM

## 2018-09-07 ENCOUNTER — Other Ambulatory Visit: Payer: Self-pay | Admitting: Family Medicine

## 2018-09-07 DIAGNOSIS — I10 Essential (primary) hypertension: Secondary | ICD-10-CM

## 2018-09-18 ENCOUNTER — Encounter: Payer: Self-pay | Admitting: Psychiatry

## 2018-09-18 ENCOUNTER — Ambulatory Visit (INDEPENDENT_AMBULATORY_CARE_PROVIDER_SITE_OTHER): Payer: Medicare Other | Admitting: Psychiatry

## 2018-09-18 VITALS — BP 125/64 | HR 57 | Ht 63.5 in

## 2018-09-18 DIAGNOSIS — F330 Major depressive disorder, recurrent, mild: Secondary | ICD-10-CM | POA: Diagnosis not present

## 2018-09-18 DIAGNOSIS — F411 Generalized anxiety disorder: Secondary | ICD-10-CM | POA: Diagnosis not present

## 2018-09-18 MED ORDER — SERTRALINE HCL 50 MG PO TABS: 50.0000 mg | ORAL_TABLET | Freq: Every day | ORAL | 1 refills | Status: DC

## 2018-09-18 NOTE — Progress Notes (Signed)
[name] [id number] 1935/12/31 82 y.o.  Subjective:   Patient ID:  [name] is a 82 y.o. (DOB 1936/05/16) female.  Chief Complaint:  Chief Complaint  Patient presents with  . Anxiety    HPI [name] presents to the office today for follow-up of anxiety and depression.  "I've been doing better." She reports that she continues to havre some difficulties with anxious thoughts and feeling nervous in the morning, with occasional dizziness. Reports that it is helpful for her to get up and get out of the house and trying to stay active in church and going to coffee shop. Has also been going to two different discussion groups at [employer] about books she has read. Has also been working in her yard some. She reports that she is "awakning in a better frame of mind." Reports that mood does not remain persistently down. Has been seeing psychologist every 2 weeks and feels this is helpful. Energy and motivation have been good. Describes concentration as "ok, maybe I still obsess about some things." "I am finding that reading is easier for me now" and is able to read for periods of a few hours consecutively. Appetite is very good. Denies SI.   Daughter reports that pt has f/u visit with neurology after neuropsych testing.   Medications: I have reviewed the patient's current medications.  Allergies:  Allergies  Allergen Reactions  . Cephalexin     REACTION: diaphoretic and drop in Blood pressure  . Lorazepam Other (See Comments)    amnesia    Past Medical History:  Diagnosis Date  . Allergic rhinitis, cause unspecified   . Contact dermatitis 03/16/2013  . Generalized anxiety disorder 02/19/2013   has seen [name], [name] and now [name] for counseling  . Hemorrhoids, external   . Hot flashes   . Hypertension   . Irritable bowel syndrome   . MDD (major depressive disorder) 03/18/2016  . Other and unspecified hyperlipidemia   . Palpitations 02/19/2013   On-set of  symptoms March '14. EKG with NSR     Past Surgical History:  Procedure Laterality Date  . benign moles removed    . CATARACT EXTRACTION W/ INTRAOCULAR LENS IMPLANT Bilateral    right - Nov '11, left - Nov '13 [place]  . HEMORRHOID SURGERY    . MANDIBLE SURGERY    . sebaceous cyst excised      Family History  Problem Relation Age of Onset  . Dementia Mother   . Heart disease Father   . Cancer Sister        breast  . Heart disease Other   . Heart disease Other   . Heart disease Other   . Cancer Sister        breast  . Hypothyroidism Sister   . Asthma Sister   . Rheum arthritis Paternal Grandmother   . Rheum arthritis Daughter     Social History   Socioeconomic History  . Marital status: Married    Spouse name: Not on file  . Number of children: 4  . Years of education: Not on file  . Highest education level: Not on file  Occupational History  . Occupation: retired  Scientific laboratory technician  . Financial resource strain: Not on file  . Food insecurity:    Worry: Not on file    Inability: Not on file  . Transportation needs:    Medical: Not on file    Non-medical: Not on file  Tobacco Use  . Smoking status: Former Smoker    Packs/day: 0.10    Years: 1.00    Pack years: 0.10    Types: Cigarettes    Last attempt to quit: 12/12/1962    Years since quitting: 55.8  . Smokeless tobacco: Never Used  . Tobacco comment: smoked for 3 months in college   Substance and Sexual Activity  . Alcohol use: Yes    Comment: rarely  . Drug use: No  . Sexual activity: Yes    Partners: Male  Lifestyle  . Physical activity:    Days per week: Not on file    Minutes per session: Not on file  . Stress: Not on file  Relationships  . Social connections:    Talks on phone: Not on file    Gets together: Not on file    Attends religious service: Not on file    Active member of club or organization: Not on file    Attends meetings of clubs or organizations: Not on file     Relationship status: Not on file  . Intimate partner violence:    Fear of current or ex partner: Not on file    Emotionally abused: Not on file    Physically abused: Not on file    Forced sexual activity: Not on file  Other Topics Concern  . Not on file  Social History Narrative   College grad. Married '61. 4 daughters, 2 grandchildren. 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   Advanced Directives: Has Living Will, HCPOA: Sandara Tyree [phone]         Pt lives in 2 story home - her husband currently resides in nursing facility    Past Medical History, Surgical history, Social history, and Family history were reviewed and updated as appropriate.   Please see review of systems for further details on the patient's review from today.   Review of Systems:  Review of Systems  Gastrointestinal: Negative.   Musculoskeletal:       Muscle tension with anxiety across her shoulders.  Allergic/Immunologic: Positive for environmental allergies.  Neurological: Positive for dizziness.       Reports some dizziness in the morning when anxiety is higher. Daughter also reports that pt also does not eat regularly in the morning.   Psychiatric/Behavioral: The patient is nervous/anxious.     Objective:   Physical Exam:  BP 125/64   Pulse (!) 57   Ht 5' 3.5" (1.613 m)   BMI 23.89 kg/m   Physical Exam  Constitutional: She is oriented to person, place, and time. She appears well-developed. No distress.  Musculoskeletal: Normal range of motion.  Neurological: She is alert and oriented to person, place, and time. Coordination normal.  Psychiatric: Her speech is normal and behavior is normal. Judgment and thought content normal. Her mood appears not anxious. Her affect is not angry, not blunt, not labile and not inappropriate. Cognition and memory are normal. She does not exhibit a depressed mood. She expresses no homicidal and no suicidal ideation. She expresses no suicidal plans and no  homicidal plans.  Affect is appropriate. Mood presents as less anxious compared to the past.     Lab Review:     Component Value Date/Time   NA 136 01/11/2018 1051   K 4.1 01/11/2018 1051   CL 100 01/11/2018 1051   CO2 30 01/11/2018 1051   GLUCOSE 99 01/11/2018 1051   BUN 19 01/11/2018 1051   CREATININE 0.74 01/11/2018 1051  CALCIUM 9.2 01/11/2018 1051   PROT 7.0 05/24/2011 1220   PROT 7.0 05/24/2011 1220   ALBUMIN 4.3 05/24/2011 1220   ALBUMIN 4.3 05/24/2011 1220   AST 18 05/24/2011 1220   AST 18 05/24/2011 1220   ALT 14 05/24/2011 1220   ALT 14 05/24/2011 1220   ALKPHOS 55 05/24/2011 1220   ALKPHOS 55 05/24/2011 1220   BILITOT 1.5 (H) 05/24/2011 1220   BILITOT 1.5 (H) 05/24/2011 1220   GFRNONAA 83 (L) 03/22/2014 1619   GFRAA >90 03/22/2014 1619       Component Value Date/Time   WBC 8.7 01/11/2018 1051   RBC 4.65 01/11/2018 1051   HGB 14.6 01/11/2018 1051   HCT 42.8 01/11/2018 1051   PLT 268.0 01/11/2018 1051   MCV 92.0 01/11/2018 1051   MCH 30.9 03/22/2014 1619   MCHC 34.1 01/11/2018 1051   RDW 12.6 01/11/2018 1051   LYMPHSABS 1.6 08/31/2011 1447   MONOABS 0.4 08/31/2011 1447   EOSABS 0.0 08/31/2011 1447   BASOSABS 0.0 08/31/2011 1447     Assessment: Plan:   Patient seen for 30 minutes and greater than 50% of visit spent counseling patient and her daughter regarding complaint of dizziness.  Discussed that dizziness could potentially be a manifestation of her anxiety.  Discussed that dizziness could also be caused by low pulse and BP since pulse is low on exam today.  Recommended checking BP and pulse at home during an episode of dizziness to determine if this is the calls, and if pulse and BP remained low to follow-up with PCP.  Also discussed that low blood sugar could cause dizziness since patient's daughter reports that she often does not eat in the morning.  Recommended eating when dizziness occurs to determine if this is helpful for her dizziness.  Reviewed  recent labs and discussed that PCPs notes indicate that she should be taking vitamin D 1000 international units for borderline low vitamin D levels.  Recommended starting vitamin D per PCPs recommendation since this may be helpful for her energy as well as preventing exacerbation of depressive signs and symptoms.  Patient to follow-up with this provider in 6 months or sooner if clinically indicated.  Recommended patient continue to follow-up with [name] for psychotherapy. Generalized anxiety disorder - Plan: sertraline (ZOLOFT) 50 MG tablet  Mild episode of recurrent major depressive disorder ([place]) - Plan: sertraline (ZOLOFT) 50 MG tablet  Please see After Visit Summary for patient specific instructions.  Future Appointments  Date Time Provider [place]  09/20/2018  1:30 PM Kirtland Bouchard, PhD [facility] None  10/04/2018  1:30 PM Kirtland Bouchard, PhD [facility] None  11/26/2018  1:00 PM Kandis Nab, PsyD [facility] None  11/26/2018  2:00 PM [facility] None  01/22/2019 11:00 AM Wynetta Fines, RN [facility]  02/12/2019  4:00 PM Cameron Sprang, MD [facility] None    No orders of the defined types were placed in this encounter.     -------------------------------

## 2018-09-20 ENCOUNTER — Ambulatory Visit (INDEPENDENT_AMBULATORY_CARE_PROVIDER_SITE_OTHER): Payer: Medicare Other | Admitting: Psychology

## 2018-09-20 DIAGNOSIS — F411 Generalized anxiety disorder: Secondary | ICD-10-CM | POA: Diagnosis not present

## 2018-09-22 NOTE — Progress Notes (Signed)
HPI:  Using dictation device. Unfortunately this device frequently misinterprets words/phrases.  KAMBER VIGNOLA is a pleasant 82 y.o. here for follow up. Chronic medical problems summarized below were reviewed for changes and stability and were updated as needed below. These issues and their treatment remain stable for the most part.  Reports is doing well for the most part.  Is tolerating the blood pressure medication well. Denies CP, SOB, DOE, treatment intolerance or new symptoms.  Reports mood has been better.  She is seeing a psychiatrist for management.  He needs to do counseling with [name].  Her husband is settling in better at the dementia unit.  Is has been hard on her. Due for labs, dexa, mammo, flu vaccine, labs.  The past did not wish to do a bone density test, however is agreeable to do today after discussion.  She also would like for [place] to order her mammogram.  To get her flu shot today. AWV was 12/24/17  HTN/HLD: -meds: hctz -extreme anxiety regarding meds (has not tolerated lisinopril, norvasc and combos)  Depression and Anxiety: -seeing psychiatry, [facility], for managment -With cognitive dysfunction, saw neurology for evaluation -meds: zoloft -See history of present illness andPhq99 -sees [name] for CBT  Hot flashes: -sees Dr Philis Pique for management   ROS: See pertinent positives and negatives per HPI.  Past Medical History:  Diagnosis Date  . Allergic rhinitis, cause unspecified   . Contact dermatitis 03/16/2013  . Generalized anxiety disorder 02/19/2013   has seen [name], [name] and now [name] for counseling  . Hemorrhoids, external   . Hot flashes   . Hypertension   . Irritable bowel syndrome   . MDD (major depressive disorder) 03/18/2016  . Other and unspecified hyperlipidemia   . Palpitations 02/19/2013   On-set of symptoms March '14. EKG with NSR     Past Surgical History:  Procedure Laterality Date  . benign moles removed    .  CATARACT EXTRACTION W/ INTRAOCULAR LENS IMPLANT Bilateral    right - Nov '11, left - Nov '13 [place]  . HEMORRHOID SURGERY    . MANDIBLE SURGERY    . sebaceous cyst excised      Family History  Problem Relation Age of Onset  . Dementia Mother   . Heart disease Father   . Cancer Sister        breast  . Heart disease Other   . Heart disease Other   . Heart disease Other   . Cancer Sister        breast  . Hypothyroidism Sister   . Asthma Sister   . Rheum arthritis Paternal Grandmother   . Rheum arthritis Daughter     SOCIAL HX: See HPI   Current Outpatient Medications:  .  Acetaminophen (TYLENOL PO), Take by mouth as needed., Disp: , Rfl:  .  AZO-CRANBERRY PO, Take by mouth as needed., Disp: , Rfl:  .  dorzolamide-timolol (COSOPT) 22.3-6.8 MG/ML ophthalmic solution, dorzolamide 22.3 mg-timolol 6.8 mg/mL eye drops, Disp: , Rfl:  .  EPINEPHrine (EPIPEN) 0.3 mg/0.3 mL SOAJ injection, Inject 0.3 mg into the muscle once as needed (for allergic reaction)., Disp: , Rfl:  .  estradiol (ESTRACE) 1 MG tablet, Take 0.5 mg by mouth at bedtime. , Disp: , Rfl:  .  fluticasone (FLONASE) 50 MCG/ACT nasal spray, USE 2 SPRAYS INTO BOTH NOSTRILS DAILY., Disp: 16 g, Rfl: 3 .  hydrochlorothiazide (HYDRODIURIL) 12.5 MG tablet, TAKE 1/2 TABLET BY MOUTH EVERY  DAY, Disp: 15 tablet, Rfl: 0 .  Multiple Vitamin (MULTIVITAMIN) capsule, Take 1 capsule by mouth daily., Disp: , Rfl:  .  OVER THE COUNTER MEDICATION, Take 1 packet by mouth daily. Dissolve one packet in a glass of water each morning. Emergen-C, Disp: , Rfl:  .  progesterone (PROMETRIUM) 100 MG capsule, Take 100 mg by mouth at bedtime. , Disp: , Rfl:  .  sertraline (ZOLOFT) 50 MG tablet, Take 1 tablet (50 mg total) by mouth daily., Disp: 90 tablet, Rfl: 1 .  vitamin B-12 (CYANOCOBALAMIN) 1000 MCG tablet, Take 1,000 mcg by mouth daily., Disp: , Rfl:   EXAM:  Vitals:   09/24/18 0854  BP: 120/60  Pulse: (!) 53  Temp: 97.9 F (36.6 C)     Body mass index is 23.78 kg/m.  GENERAL: vitals reviewed and listed above, alert, oriented, appears well hydrated and in no acute distress  HEENT: atraumatic, conjunttiva clear, no obvious abnormalities on inspection of external nose and ears  NECK: no obvious masses on inspection  LUNGS: clear to auscultation bilaterally, no wheezes, rales or rhonchi, good air movement  CV: HRRR, no peripheral edema  MS: moves all extremities without noticeable abnormality  PSYCH: pleasant and cooperative, no obvious depression or anxiety  ASSESSMENT AND PLAN:  Discussed the following assessment and plan:  Essential hypertension - Plan: Basic metabolic panel, CBC  Hyperlipidemia, unspecified hyperlipidemia type - Plan: Lipid panel  Mild episode of recurrent major depressive disorder (HCC)  Generalized anxiety disorder  Estrogen deficiency - Plan: DG Bone Density  Breast cancer screening  -Labs per orders -Recommended a healthy diet and regular exercise -Advised my assistant to order mammogram and bone density test -Flu shot today -See PHQ 9, seeing psychiatry for management of anxiety depression -Follow-up and annual wellness visit in about 3 to 4 months  Patient Instructions  BEFORE YOU LEAVE: -she would prefer if we order the mammogram - please order -please order bone density test through [place] -flu shot -labs -follow up: AWV with [name] and follow up with [name] in January   Get the mammogram and the bone density test. Please call us if you have not heard about these appointments in the next 1-2 weeks.  We have ordered labs or studies at this visit. It can take up to 1-2 weeks for results and processing. IF results require follow up or explanation, we will call you with instructions. Clinically stable results will be released to your [facility]. If you have not heard from [place] or cannot find your results in [facility] in 2 weeks please contact our office at [phone].  If  you are not yet signed up for [facility], please consider signing up.            [name], DO

## 2018-09-24 ENCOUNTER — Telehealth: Payer: Self-pay | Admitting: *Deleted

## 2018-09-24 ENCOUNTER — Encounter: Payer: Self-pay | Admitting: Family Medicine

## 2018-09-24 ENCOUNTER — Ambulatory Visit (INDEPENDENT_AMBULATORY_CARE_PROVIDER_SITE_OTHER): Payer: Medicare Other | Admitting: Family Medicine

## 2018-09-24 VITALS — BP 120/60 | HR 53 | Temp 97.9°F | Ht 63.5 in | Wt 136.4 lb

## 2018-09-24 DIAGNOSIS — Z23 Encounter for immunization: Secondary | ICD-10-CM

## 2018-09-24 DIAGNOSIS — F330 Major depressive disorder, recurrent, mild: Secondary | ICD-10-CM

## 2018-09-24 DIAGNOSIS — Z1239 Encounter for other screening for malignant neoplasm of breast: Secondary | ICD-10-CM | POA: Diagnosis not present

## 2018-09-24 DIAGNOSIS — F411 Generalized anxiety disorder: Secondary | ICD-10-CM

## 2018-09-24 DIAGNOSIS — E2839 Other primary ovarian failure: Secondary | ICD-10-CM | POA: Diagnosis not present

## 2018-09-24 DIAGNOSIS — E785 Hyperlipidemia, unspecified: Secondary | ICD-10-CM | POA: Diagnosis not present

## 2018-09-24 DIAGNOSIS — I10 Essential (primary) hypertension: Secondary | ICD-10-CM

## 2018-09-24 NOTE — Addendum Note (Signed)
Addended by: Mystic Lawrence on: 09/24/2018 09:27 AM   Modules accepted: Orders

## 2018-09-24 NOTE — Telephone Encounter (Signed)
Order for screening mammogram completed for [facility] and faxed to [phone].

## 2018-09-24 NOTE — Patient Instructions (Addendum)
BEFORE YOU LEAVE: -she would prefer if we order the mammogram - please order -please order bone density test through [place] -flu shot -labs -follow up: AWV with [name] and follow up with [name] in January   Get the mammogram and the bone density test. Please call us if you have not heard about these appointments in the next 1-2 weeks.  We have ordered labs or studies at this visit. It can take up to 1-2 weeks for results and processing. IF results require follow up or explanation, we will call you with instructions. Clinically stable results will be released to your [facility]. If you have not heard from [place] or cannot find your results in [facility] in 2 weeks please contact our office at [phone].  If you are not yet signed up for [facility], please consider signing up.

## 2018-10-02 DIAGNOSIS — H401233 Low-tension glaucoma, bilateral, severe stage: Secondary | ICD-10-CM | POA: Diagnosis not present

## 2018-10-03 ENCOUNTER — Other Ambulatory Visit: Payer: Self-pay | Admitting: Family Medicine

## 2018-10-03 DIAGNOSIS — I10 Essential (primary) hypertension: Secondary | ICD-10-CM

## 2018-10-04 ENCOUNTER — Ambulatory Visit (INDEPENDENT_AMBULATORY_CARE_PROVIDER_SITE_OTHER): Payer: Medicare Other | Admitting: Psychology

## 2018-10-04 ENCOUNTER — Telehealth: Payer: Self-pay | Admitting: Neurology

## 2018-10-04 DIAGNOSIS — F411 Generalized anxiety disorder: Secondary | ICD-10-CM

## 2018-10-04 NOTE — Telephone Encounter (Signed)
Dr Bailar is leaving our office and we will not be able to see patient we have mailed a letter to patient to inform them of the cancellation. Thank you for your understanding  °

## 2018-10-15 ENCOUNTER — Telehealth: Payer: Self-pay | Admitting: *Deleted

## 2018-10-15 ENCOUNTER — Ambulatory Visit (INDEPENDENT_AMBULATORY_CARE_PROVIDER_SITE_OTHER): Payer: Medicare Other | Admitting: *Deleted

## 2018-10-15 DIAGNOSIS — Z23 Encounter for immunization: Secondary | ICD-10-CM

## 2018-10-15 NOTE — Telephone Encounter (Signed)
Copied from [place] [id number]. Topic: General - Other >> Oct 15, 2018 10:30 AM Yvette Rack wrote: Reason for CRM: Pt states she was suppose to be contacted for scheduling of bone density test, mammogram, and pneumonia vaccine. Pt requests call back. Cb# [phone]

## 2018-10-15 NOTE — Telephone Encounter (Signed)
Patient came in for a flu vaccine 10/15/18.  All concerns were addressed then.

## 2018-10-17 ENCOUNTER — Ambulatory Visit (INDEPENDENT_AMBULATORY_CARE_PROVIDER_SITE_OTHER)
Admission: RE | Admit: 2018-10-17 | Discharge: 2018-10-17 | Disposition: A | Discharge status: Home / Self Care | Payer: Medicare Other | Source: Ambulatory Visit | Attending: Family Medicine | Admitting: Family Medicine

## 2018-10-17 DIAGNOSIS — E2839 Other primary ovarian failure: Secondary | ICD-10-CM | POA: Diagnosis not present

## 2018-10-23 ENCOUNTER — Telehealth: Payer: Self-pay | Admitting: *Deleted

## 2018-10-24 DIAGNOSIS — N6325 Unspecified lump in the left breast, overlapping quadrants: Secondary | ICD-10-CM | POA: Diagnosis not present

## 2018-10-24 DIAGNOSIS — R921 Mammographic calcification found on diagnostic imaging of breast: Secondary | ICD-10-CM | POA: Diagnosis not present

## 2018-10-24 DIAGNOSIS — Z1231 Encounter for screening mammogram for malignant neoplasm of breast: Secondary | ICD-10-CM | POA: Diagnosis not present

## 2018-10-24 LAB — HM MAMMOGRAPHY

## 2018-10-25 ENCOUNTER — Ambulatory Visit (INDEPENDENT_AMBULATORY_CARE_PROVIDER_SITE_OTHER): Payer: Medicare Other | Admitting: Psychology

## 2018-10-25 DIAGNOSIS — F411 Generalized anxiety disorder: Secondary | ICD-10-CM

## 2018-10-29 ENCOUNTER — Telehealth: Payer: Self-pay | Admitting: Family Medicine

## 2018-10-29 NOTE — Telephone Encounter (Signed)
I am sorry she is not doing well and would recommend follow up with their neurologist whom referred her to Dr. Si Raider. Thank you.

## 2018-10-29 NOTE — Telephone Encounter (Signed)
Copied from [place] [id number]. Topic: Referral - Status >> Oct 29, 2018  1:01 PM Scherrie Gerlach wrote: Reason for CRM: Daughter [name] states the appt they had with Dr Si Raider has been cancelled due to that dr leaving their office.  They will not be able to see patient . Pt had this appt for 6 months.  Daughter states pt is getting worse with her tremors, fuzzy headed, and cannot remember things. [name] would like to know if Dr Maudie Mercury has any ideas, and hopes they will not have to wait another 6 months for an appt

## 2018-10-30 ENCOUNTER — Telehealth: Payer: Self-pay | Admitting: Neurology

## 2018-10-30 NOTE — Telephone Encounter (Signed)
Left msg regarding letter (Dr Si Raider leaving). Explained that we would be happy to refer elsewhere and to please call our office if wanting to proceed.

## 2018-10-30 NOTE — Telephone Encounter (Signed)
I left a detailed message with the information below on [employer].

## 2018-11-02 ENCOUNTER — Encounter: Payer: Self-pay | Admitting: Family Medicine

## 2018-11-02 DIAGNOSIS — N6325 Unspecified lump in the left breast, overlapping quadrants: Secondary | ICD-10-CM | POA: Diagnosis not present

## 2018-11-02 DIAGNOSIS — Z1231 Encounter for screening mammogram for malignant neoplasm of breast: Secondary | ICD-10-CM | POA: Diagnosis not present

## 2018-11-02 DIAGNOSIS — R921 Mammographic calcification found on diagnostic imaging of breast: Secondary | ICD-10-CM | POA: Diagnosis not present

## 2018-11-02 LAB — HM MAMMOGRAPHY

## 2018-11-05 ENCOUNTER — Ambulatory Visit (INDEPENDENT_AMBULATORY_CARE_PROVIDER_SITE_OTHER): Payer: Medicare Other | Admitting: Psychology

## 2018-11-05 DIAGNOSIS — F411 Generalized anxiety disorder: Secondary | ICD-10-CM

## 2018-11-13 ENCOUNTER — Other Ambulatory Visit: Payer: Self-pay

## 2018-11-13 DIAGNOSIS — R413 Other amnesia: Secondary | ICD-10-CM

## 2018-11-16 NOTE — Progress Notes (Signed)
Received after-hours message from pt's husband requesting that referral and LOV notes be resubmitted to to [facility] at [phone].  Above information faxed to number provided.  Confirmation of receipt received.

## 2018-11-21 ENCOUNTER — Telehealth: Payer: Self-pay | Admitting: Neurology

## 2018-11-21 NOTE — Telephone Encounter (Signed)
Yes, f/u after Neurocog testing in March, thanks!

## 2018-11-21 NOTE — Telephone Encounter (Signed)
Patient's daughter called and is needing to know if her mom should keep her appointment with Dr. Delice Lesch for the Follow up after Dr. Si Raider? She never had the visits with Dr. Si Raider. Her daughter wants to know should she reschedule for after she sees [facility]? Please Call. Thank you

## 2018-11-21 NOTE — Telephone Encounter (Signed)
It looks like pt is scheduled with [facility] on 02/12/2019.  pls advise.

## 2018-11-22 ENCOUNTER — Ambulatory Visit (INDEPENDENT_AMBULATORY_CARE_PROVIDER_SITE_OTHER): Payer: Medicare Other | Admitting: Psychology

## 2018-11-22 DIAGNOSIS — F411 Generalized anxiety disorder: Secondary | ICD-10-CM | POA: Diagnosis not present

## 2018-11-23 NOTE — Telephone Encounter (Signed)
Spoke with pt's daughter, Morene Antu.  Appointment rescheduled for Wednesday, March 20 2019 at [facility].

## 2018-11-26 ENCOUNTER — Encounter: Payer: Self-pay | Admitting: Psychology

## 2018-11-28 ENCOUNTER — Encounter: Payer: Self-pay | Admitting: Family Medicine

## 2018-12-13 DIAGNOSIS — F330 Major depressive disorder, recurrent, mild: Secondary | ICD-10-CM | POA: Diagnosis not present

## 2018-12-13 DIAGNOSIS — F411 Generalized anxiety disorder: Secondary | ICD-10-CM | POA: Diagnosis not present

## 2018-12-13 DIAGNOSIS — R413 Other amnesia: Secondary | ICD-10-CM | POA: Diagnosis not present

## 2018-12-14 NOTE — Telephone Encounter (Signed)
Error

## 2018-12-21 ENCOUNTER — Ambulatory Visit (INDEPENDENT_AMBULATORY_CARE_PROVIDER_SITE_OTHER): Payer: Medicare Other | Admitting: Psychology

## 2018-12-21 DIAGNOSIS — F411 Generalized anxiety disorder: Secondary | ICD-10-CM

## 2019-01-02 ENCOUNTER — Telehealth: Payer: Self-pay | Admitting: Neurology

## 2019-01-02 DIAGNOSIS — R413 Other amnesia: Secondary | ICD-10-CM | POA: Diagnosis not present

## 2019-01-02 NOTE — Telephone Encounter (Signed)
Daughter wanting to start mom on [employer]. She just did neuropsych testing and they have the results. Please call her back at [phone]. Thanks!

## 2019-01-07 ENCOUNTER — Telehealth: Payer: Self-pay | Admitting: Neurology

## 2019-01-07 MED ORDER — DONEPEZIL HCL 10 MG PO TABS: ORAL_TABLET | ORAL | 3 refills | Status: DC

## 2019-01-07 NOTE — Telephone Encounter (Signed)
OK to send Rx?

## 2019-01-07 NOTE — Telephone Encounter (Signed)
Aricept 10mg  #90 with 3 refills Sig = take half tab x2weeks, then full tab daily  Sent to [facility] on [address]

## 2019-01-07 NOTE — Telephone Encounter (Signed)
Patient's Daughter Morene Antu called and is wanting to have her mom start on Aricept medication per Dr. McDermott's recommendation. [facility] [address]. Please Call. Thanks

## 2019-01-07 NOTE — Telephone Encounter (Signed)
Pls send in Rx for Donepezil (Aricept) 10mg : take 1/2 tab daily for 2 weeks, then increase to 1 tablet daily. Main side effect can be diarrhea that is why we start slowly. Pls request for copy of Neuropsych testing from Dr. McDermott's office for our records. Thanks!

## 2019-01-07 NOTE — Telephone Encounter (Signed)
Request to Neuropsych notes faxed to Dr. Vikki Ports

## 2019-01-10 ENCOUNTER — Ambulatory Visit (INDEPENDENT_AMBULATORY_CARE_PROVIDER_SITE_OTHER): Payer: Medicare Other | Admitting: Psychology

## 2019-01-10 DIAGNOSIS — F411 Generalized anxiety disorder: Secondary | ICD-10-CM

## 2019-01-14 ENCOUNTER — Telehealth: Payer: Self-pay | Admitting: Neurology

## 2019-01-14 NOTE — Telephone Encounter (Signed)
Patient daughter [name] called and wants to talk to someone about her mother on the Aricept. She is very very confused and they had to take the keys away from her

## 2019-01-15 NOTE — Telephone Encounter (Signed)
Attempted to call daughter.  No answer.  VM Box full.  Unable to relay message below.

## 2019-01-15 NOTE — Telephone Encounter (Signed)
Spoke with pt's daughter, [name].  She states that pt took Aricept for 3 days.  On Saturday, 02.01.2020 pt woke up early and drove her cat to the [facility], telling them "it's just not working with him being home.  I think he should stay here a little longer" the employees had no idea what she was talking about and pt stated that her cat, Marcello Moores, had been living at the [facility] for 4 months, and that she had just picked him up on Friday - Her cat has not been to the clinic recently.  Daughter states that pt has no memory of driving to the clinic, or back home from the clinic.  Daughter also states that pt started experiencing hallucinations on Saturday but could not give me any detail.  [name] states that pt has not had any Aricept since Saturday and they do see an improvement since d/c.  pls advise  Daughter CB#[phone] OK to leave message

## 2019-01-15 NOTE — Telephone Encounter (Signed)
Hold aricept and update our office in 2 weeks if she continues to have any similar confusion. Thanks

## 2019-01-21 DIAGNOSIS — H401233 Low-tension glaucoma, bilateral, severe stage: Secondary | ICD-10-CM | POA: Diagnosis not present

## 2019-01-21 DIAGNOSIS — H43812 Vitreous degeneration, left eye: Secondary | ICD-10-CM | POA: Diagnosis not present

## 2019-01-21 NOTE — Progress Notes (Signed)
Subjective:   [name] is a 83 y.o. female who presents for Medicare Annual (Subsequent) preventive examination.  Review of Systems:  No ROS.  Medicare Wellness Visit. Additional risk factors are reflected in the social history.  Cardiac Risk Factors include: advanced age (>56men, >6 women) Sleep patterns: feels rested on waking, does not get up to void and sleeps 6-7 hours nightly.    Home Safety/Smoke Alarms: Feels safe in home. Smoke alarms in place.  Living environment; residence and Firearm Safety: 2-story house. Lives alone, but daughters local, check in on patient. No use or need for DME at this time.  Seat Belt Safety/Bike Helmet: Wears seat belt. Still drives.   Female:   Pap- N/A d/t age       68- 10/2018, due 10/2019, pt. and daughter made aware.      Dexa scan- 10/2018, due11/2021, pt. and daughter made aware.    CCS- 07/2009, no longer indicated d/t age      Objective:     Vitals: BP 106/64 (BP Location: Left Arm, Patient Position: Sitting, Cuff Size: Normal)   Pulse (!) 50   Temp 97.6 F (36.4 C) (Oral)   Resp 14   Ht 5' 2.5" (1.588 m)   Wt 130 lb (59 kg)   SpO2 97%   BMI 23.40 kg/m   Body mass index is 23.4 kg/m.  Advanced Directives 01/22/2019 01/10/2017 12/24/2015 05/07/2014  Does Patient Have a Medical Advance Directive? Yes Yes Yes Patient does not have advance directive  Type of Advance Directive [place];Living will - [place];Living will -  Does patient want to make changes to medical advance directive? No - Patient declined - - -  Copy of [place] in Chart? No - copy requested - No - copy requested -    Tobacco Social History   Tobacco Use  Smoking Status Former Smoker  . Packs/day: 0.10  . Years: 1.00  . Pack years: 0.10  . Types: Cigarettes  . Last attempt to quit: 12/12/1962  . Years since quitting: 56.1  Smokeless Tobacco Never Used  Tobacco Comment   smoked for 3  months in college      Counseling given: Not Answered Comment: smoked for 3 months in college     Past Medical History:  Diagnosis Date  . Allergic rhinitis, cause unspecified   . Contact dermatitis 03/16/2013  . Generalized anxiety disorder 02/19/2013   has seen [name], [name] and now [name] for counseling  . Hemorrhoids, external   . Hot flashes   . Hypertension   . Irritable bowel syndrome   . MDD (major depressive disorder) 03/18/2016  . Other and unspecified hyperlipidemia   . Palpitations 02/19/2013   On-set of symptoms March '14. EKG with NSR    Past Surgical History:  Procedure Laterality Date  . benign moles removed    . CATARACT EXTRACTION W/ INTRAOCULAR LENS IMPLANT Bilateral    right - Nov '11, left - Nov '13 [place]  . HEMORRHOID SURGERY    . MANDIBLE SURGERY    . sebaceous cyst excised     Family History  Problem Relation Age of Onset  . Dementia Mother   . Heart disease Father   . Cancer Sister        breast  . Heart disease Other   . Heart disease Other   . Heart disease Other   . Cancer Sister  breast  . Hypothyroidism Sister   . Asthma Sister   . Rheum arthritis Paternal Grandmother   . Rheum arthritis Daughter    Social History   Socioeconomic History  . Marital status: Married    Spouse name: Not on file  . Number of children: 4  . Years of education: Not on file  . Highest education level: Not on file  Occupational History  . Occupation: Freight forwarder at [employer]    Comment: Retired  Scientific laboratory technician  . Financial resource strain: Not hard at all  . Food insecurity:    Worry: Never true    Inability: Never true  . Transportation needs:    Medical: No    Non-medical: No  Tobacco Use  . Smoking status: Former Smoker    Packs/day: 0.10    Years: 1.00    Pack years: 0.10    Types: Cigarettes    Last attempt to quit: 12/12/1962    Years since quitting: 56.1  . Smokeless tobacco: Never Used  . Tobacco  comment: smoked for 3 months in college   Substance and Sexual Activity  . Alcohol use: Not Currently    Comment: rarely  . Drug use: No  . Sexual activity: Not Currently    Partners: Male  Lifestyle  . Physical activity:    Days per week: 2 days    Minutes per session: 30 min  . Stress: To some extent  Relationships  . Social connections:    Talks on phone: More than three times a week    Gets together: Twice a week    Attends religious service: Not on file    Active member of club or organization: Yes    Attends meetings of clubs or organizations: More than 4 times per year    Relationship status: Widowed  Other Topics Concern  . Not on file  Social History Narrative   College grad. Married '61. 4 daughters, 2 grandchildren. 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   Advanced Directives: Has Living Will, HCPOA: [name] [phone]         Pt lives in 2 story home - her husband currently resides in nursing facility      01/22/19: Pt. Lives in [place] home, daughters check in on patient, managing medications   Pt. Still drives, manages yardwork she says   Enjoys swim aerobics at the [facility]    Outpatient Encounter Medications as of 01/22/2019  Medication Sig  . Acetaminophen (TYLENOL PO) Take by mouth as needed.  . dorzolamide-timolol (COSOPT) 22.3-6.8 MG/ML ophthalmic solution dorzolamide 22.3 mg-timolol 6.8 mg/mL eye drops  . estradiol (ESTRACE) 1 MG tablet Take 0.5 mg by mouth at bedtime.   . fluticasone (FLONASE) 50 MCG/ACT nasal spray USE 2 SPRAYS INTO BOTH NOSTRILS DAILY.  . hydrochlorothiazide (HYDRODIURIL) 12.5 MG tablet TAKE 1/2 TABLET BY MOUTH EVERY DAY  . Multiple Vitamin (MULTIVITAMIN) capsule Take 1 capsule by mouth daily.  [name] OVER THE COUNTER MEDICATION Take 1 packet by mouth daily. Dissolve one packet in a glass of water each morning. Emergen-C  . progesterone (PROMETRIUM) 100 MG capsule Take 100 mg by mouth at bedtime.   . sertraline (ZOLOFT) 50 MG  tablet Take 1 tablet (50 mg total) by mouth daily. (Patient taking differently: Take 25 mg by mouth daily. )  . AZO-CRANBERRY PO Take by mouth as needed.  . donepezil (ARICEPT) 10 MG tablet Take half tablet each day for 2 weeks, then increase to 1 full tablet  daily.  [name] EPINEPHrine (EPIPEN) 0.3 mg/0.3 mL SOAJ injection Inject 0.3 mg into the muscle once as needed (for allergic reaction).  . vitamin B-12 (CYANOCOBALAMIN) 1000 MCG tablet Take 1,000 mcg by mouth daily.  . [DISCONTINUED] hydrochlorothiazide (HYDRODIURIL) 12.5 MG tablet TAKE 1/2 TABLET BY MOUTH EVERY DAY   No facility-administered encounter medications on file as of 01/22/2019.     Activities of Daily Living In your present state of health, do you have any difficulty performing the following activities: 01/22/2019  Hearing? N  Vision? N  Difficulty concentrating or making decisions? Y  Walking or climbing stairs? N  Dressing or bathing? N  Doing errands, shopping? N  Preparing Food and eating ? N  Using the Toilet? N  In the past six months, have you accidently leaked urine? N  Do you have problems with loss of bowel control? N  Managing your Medications? Y  Managing your Finances? Y  Housekeeping or managing your Housekeeping? N  Comment although daughter at side states otherwise. Resources provided.  Some recent data might be hidden    Patient Care Team: [name], DO as PCP - General (Family Medicine) Cher Nakai, Lexine Baton, DO as Consulting Physician (Ophthalmology) Thayer Headings, PMHNP as Nurse Practitioner (Psychiatry) Kirtland Bouchard, PhD as Consulting Physician (Psychology) Delice Lesch Lezlie Octave, MD as Consulting Physician (Neurology) McDermott, Grant Ruts, PsyD (Inactive) (Psychiatry)    Assessment:   This is a routine wellness examination for [place]. Physical assessment deferred to PCP.   Exercise Activities and Dietary recommendations Current Exercise Habits: Structured exercise class, Time (Minutes): 30, Frequency  (Times/Week): 3, Weekly Exercise (Minutes/Week): 90, Intensity: Moderate, Exercise limited by: psychological condition(s);cardiac condition(s) Diet (meal preparation, eat out, water intake, caffeinated beverages, dairy products, fruits and vegetables): Pt. states her appetite is good, but daughter at side is not sure if pt. is really eating well. Pt. states she drinks 8 glasses of water/day. Importance of maintaining hydration and high protein discussed. Ensure supplement and coupons provided.       Goals    . patient     Getting up happy and feeling good     . Patient Stated     Continue to enjoy your reprieve and good feelings  Enjoying your life!    . Patient Stated     Wean off medications I don't need! Return to walking routine, sticking to a daily routine       Fall Risk Fall Risk  01/22/2019 06/19/2018 01/19/2018 12/18/2017 01/10/2017  Falls in the past year? 0 No No No No  Follow up Education provided;Falls prevention discussed - - - -     Depression Screen PHQ 2/9 Scores 01/22/2019 09/24/2018 01/19/2018 01/11/2018  PHQ - 2 Score 1 1 0 1  PHQ- 9 Score 2 3 - 5     Cognitive Function MMSE - Mini Mental State Exam 06/19/2018 01/19/2018 12/21/2017 09/26/2017 01/10/2017  Not completed: - (No Data) - - (No Data)  Orientation to time 5 - 4 3 -  Orientation to Place 5 - 4 5 -  Registration 3 - 3 3 -  Attention/ Calculation 5 - 5 5 -  Recall 0 - 3 3 -  Language- name 2 objects 2 - 2 2 -  Language- repeat 1 - 1 1 -  Language- follow 3 step command 3 - 3 3 -  Language- read & follow direction 1 - 1 1 -  Write a sentence 1 - 1 1 -  Copy design 1 -  1 1 -  Total score 27 - 28 28 -   No MMSE completed as pt. Is being followed by specialists at this time. Family and pt. attribute known memory deficits to mostly medication, and daughter [name] is assisting patient is titrating pt. Off zoloft. Author advised both pt. And family to consult with PCP first before making any more changes. Pt. To  have OV with [name] today.       Immunization History  Administered Date(s) Administered  . Influenza Split 08/12/2012  . Influenza Whole 09/12/2009  . Influenza, High Dose Seasonal PF 08/24/2013, 09/04/2015, 08/29/2016, 08/24/2017, 09/24/2018, 10/15/2018  . Influenza-Unspecified 08/12/2012, 09/28/2014  . Pneumococcal Conjugate-13 10/31/2014  . Pneumococcal-Unspecified 08/24/2013  . Td 05/14/2007  . Tdap 10/31/2014  . Zoster 09/24/2013    Qualifies for Shingles Vaccine? Yes, advised to receive from local pharmacist.  Screening Tests Health Maintenance  Topic Date Due  . MAMMOGRAM  11/03/2019  . TETANUS/TDAP  10/31/2024  . INFLUENZA VACCINE  Completed  . DEXA SCAN  Completed  . PNA vac Low Risk Adult  Completed       Plan:     Bring a copy of your living will and/or healthcare power of attorney to your next office visit.  Continue doing brain stimulating activities (puzzles, reading, adult coloring books, staying active) to keep memory sharp.   Discuss medication management with [name] today-particularly need for BP med, titrating off zoloft, and managing hormone therapy.  Refer to caregiver and in-home assistance resources provided.  Great idea to incorporate walking daily into your routine--routine is very important as we age, especially as we start to struggle with memory issues.  Keep swimming! Thank you for coming in today! Let me know if we can further assist you or your family in reaching your goals and living your best life!  I have personally reviewed and noted the following in the patient's chart:   . Medical and social history . Use of alcohol, tobacco or illicit drugs  . Current medications and supplements . Functional ability and status . Nutritional status . Physical activity . Advanced directives . List of other physicians . Vitals . Screenings to include cognitive, depression, and falls . Referrals and appointments  In addition, I have  reviewed and discussed with patient certain preventive protocols, quality metrics, and best practice recommendations. A written personalized care plan for preventive services as well as general preventive health recommendations were provided to patient.     Alphia Moh, RN  01/22/2019

## 2019-01-22 ENCOUNTER — Encounter: Payer: Self-pay | Admitting: Family Medicine

## 2019-01-22 ENCOUNTER — Ambulatory Visit (INDEPENDENT_AMBULATORY_CARE_PROVIDER_SITE_OTHER): Payer: Medicare Other | Admitting: Family Medicine

## 2019-01-22 ENCOUNTER — Ambulatory Visit (INDEPENDENT_AMBULATORY_CARE_PROVIDER_SITE_OTHER): Payer: Medicare Other

## 2019-01-22 VITALS — BP 106/64 | HR 50 | Temp 97.6°F | Ht 62.5 in | Wt 130.0 lb

## 2019-01-22 VITALS — BP 106/64 | HR 50 | Temp 97.6°F | Resp 14 | Ht 62.5 in | Wt 130.0 lb

## 2019-01-22 DIAGNOSIS — I10 Essential (primary) hypertension: Secondary | ICD-10-CM

## 2019-01-22 DIAGNOSIS — E785 Hyperlipidemia, unspecified: Secondary | ICD-10-CM | POA: Diagnosis not present

## 2019-01-22 DIAGNOSIS — Z0000 Encounter for general adult medical examination without abnormal findings: Secondary | ICD-10-CM

## 2019-01-22 DIAGNOSIS — R413 Other amnesia: Secondary | ICD-10-CM | POA: Diagnosis not present

## 2019-01-22 DIAGNOSIS — F411 Generalized anxiety disorder: Secondary | ICD-10-CM

## 2019-01-22 DIAGNOSIS — F330 Major depressive disorder, recurrent, mild: Secondary | ICD-10-CM | POA: Diagnosis not present

## 2019-01-22 LAB — CBC
HCT: 41.6 % (ref 36.0–46.0)
Hemoglobin: 14.1 g/dL (ref 12.0–15.0)
MCHC: 33.8 g/dL (ref 30.0–36.0)
MCV: 93.1 fl (ref 78.0–100.0)
Platelets: 259 10*3/uL (ref 150.0–400.0)
RBC: 4.47 Mil/uL (ref 3.87–5.11)
RDW: 13.1 % (ref 11.5–15.5)
WBC: 5.5 10*3/uL (ref 4.0–10.5)

## 2019-01-22 LAB — BASIC METABOLIC PANEL
BUN: 14 mg/dL (ref 6–23)
CO2: 29 meq/L (ref 19–32)
Calcium: 9.3 mg/dL (ref 8.4–10.5)
Chloride: 98 mEq/L (ref 96–112)
Creatinine, Ser: 0.81 mg/dL (ref 0.40–1.20)
GFR: 67.86 mL/min (ref >60.00)
Glucose, Bld: 81 mg/dL (ref 70–99)
Potassium: 4 mEq/L (ref 3.5–5.1)
Sodium: 135 mEq/L (ref 135–145)

## 2019-01-22 NOTE — Patient Instructions (Signed)
BEFORE YOU LEAVE: -labs -follow up: 4-6 months  We have ordered labs or studies at this visit. It can take up to 1-2 weeks for results and processing. IF results require follow up or explanation, we will call you with instructions. Clinically stable results will be released to your [facility]. If you have not heard from [place] or cannot find your results in [facility] in 2 weeks please contact our office at [phone].  If you are not yet signed up for [facility], please consider signing up.

## 2019-01-22 NOTE — Progress Notes (Signed)
HPI:  Using dictation device. Unfortunately this device frequently misinterprets words/phrases.  [name] is a pleasant 83 y.o. here for follow up. Chronic medical problems summarized below were reviewed for changes and stability and were updated as needed below. These issues and their treatment remain stable for the most part.  Seeing neurology, psychiatry, neuropsych for management anxiety, depression and? 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 Neurologist started aricept - but daughter and pt report caused hallucinations. Now improved with stopping and seeing neurology about this.  Reports much better now and they are also trying to wean off of her zoloft as well. Reports they are working with her psychiatris tand have psych and neuro follow up soon. Daughter feels some of the memory issues may be medication related. Denies CP, dizziness, SOB, DOE, treatment intolerance or new symptoms. Had AWV 01/22/19 with Raquel Sarna  HTN/HLD: -meds: hctz 12.5 -extreme anxiety regarding meds (has not tolerated lisinopril, norvasc and combos)  Depression and Anxiety: -seeing psychiatry, [facility], for managment -With cognitive dysfunction, also seeing neurology and neuropsych -meds:zoloft weaning off 01/2019 -See history of present illness andPhq99 -sees [name] for CBT  Hot flashes: -sees Dr Philis Pique for management -on oral HRT  ROS: See pertinent positives and negatives per HPI.  Past Medical History:  Diagnosis Date  . Allergic rhinitis, cause unspecified   . Contact dermatitis 03/16/2013  . Generalized anxiety disorder 02/19/2013   has seen [name], [name] and now [name] for counseling  . Hemorrhoids, external   . Hot flashes   . Hypertension   . Irritable bowel syndrome   . MDD (major depressive disorder) 03/18/2016  . Other and unspecified hyperlipidemia   . Palpitations 02/19/2013   On-set of symptoms March '14. EKG with NSR     Past Surgical History:  Procedure Laterality  Date  . benign moles removed    . CATARACT EXTRACTION W/ INTRAOCULAR LENS IMPLANT Bilateral    right - Nov '11, left - Nov '13 [place]  . HEMORRHOID SURGERY    . MANDIBLE SURGERY    . sebaceous cyst excised      Family History  Problem Relation Age of Onset  . Dementia Mother   . Heart disease Father   . Cancer Sister        breast  . Heart disease Other   . Heart disease Other   . Heart disease Other   . Cancer Sister        breast  . Hypothyroidism Sister   . Asthma Sister   . Rheum arthritis Paternal Grandmother   . Rheum arthritis Daughter     SOCIAL HX: see hpi   Current Outpatient Medications:  .  Acetaminophen (TYLENOL PO), Take by mouth as needed., Disp: , Rfl:  .  AZO-CRANBERRY PO, Take by mouth as needed., Disp: , Rfl:  .  donepezil (ARICEPT) 10 MG tablet, Take half tablet each day for 2 weeks, then increase to 1 full tablet daily., Disp: 90 tablet, Rfl: 3 .  dorzolamide-timolol (COSOPT) 22.3-6.8 MG/ML ophthalmic solution, dorzolamide 22.3 mg-timolol 6.8 mg/mL eye drops, Disp: , Rfl:  .  EPINEPHrine (EPIPEN) 0.3 mg/0.3 mL SOAJ injection, Inject 0.3 mg into the muscle once as needed (for allergic reaction)., Disp: , Rfl:  .  estradiol (ESTRACE) 1 MG tablet, Take 0.5 mg by mouth at bedtime. , Disp: , Rfl:  .  fluticasone (FLONASE) 50 MCG/ACT nasal spray, USE 2 SPRAYS INTO BOTH NOSTRILS DAILY., Disp: 16 g, Rfl: 3 .  hydrochlorothiazide (HYDRODIURIL)  12.5 MG tablet, TAKE 1/2 TABLET BY MOUTH EVERY DAY, Disp: 15 tablet, Rfl: 0 .  Multiple Vitamin (MULTIVITAMIN) capsule, Take 1 capsule by mouth daily., Disp: , Rfl:  .  OVER THE COUNTER MEDICATION, Take 1 packet by mouth daily. Dissolve one packet in a glass of water each morning. Emergen-C, Disp: , Rfl:  .  progesterone (PROMETRIUM) 100 MG capsule, Take 100 mg by mouth at bedtime. , Disp: , Rfl:  .  sertraline (ZOLOFT) 50 MG tablet, Take 1 tablet (50 mg total) by mouth daily. (Patient taking differently: Take 25  mg by mouth daily. ), Disp: 90 tablet, Rfl: 1 .  vitamin B-12 (CYANOCOBALAMIN) 1000 MCG tablet, Take 1,000 mcg by mouth daily., Disp: , Rfl:   EXAM:  Vitals:   01/22/19 1257  BP: 106/64  Pulse: (!) 50  Temp: 97.6 F (36.4 C)    Body mass index is 23.4 kg/m.  GENERAL: vitals reviewed and listed above, alert, oriented, appears well hydrated and in no acute distress  HEENT: atraumatic, conjunttiva clear, no obvious abnormalities on inspection of external nose and ears  NECK: no obvious masses on inspection  LUNGS: clear to auscultation bilaterally, no wheezes, rales or rhonchi, good air movement  CV: HRRR, no peripheral edema  MS: moves all extremities without noticeable abnormality  PSYCH: pleasant and cooperative, no obvious depression or anxiety  ASSESSMENT AND PLAN:  Discussed the following assessment and plan:  Essential hypertension - Plan: Basic metabolic panel, CBC  Hyperlipidemia, unspecified hyperlipidemia type  Generalized anxiety disorder  Mild episode of recurrent major depressive disorder (HCC)  Memory loss  -labs per orders -they are trying to minimize medications as much as possible - discussed trying to stop hctz as well given BP has been great, even on the low end today, but they opted to continue for now -they plan to talk with gyn about possibly coming off of HRT -seeing neurology, psychiatry and neuropscyh for mood and memory issues -follow up 4-6 months -Patient advised to return or notify a doctor immediately if symptoms worsen or persist or new concerns arise.  Patient Instructions  BEFORE YOU LEAVE: -labs -follow up: 4-6 months  We have ordered labs or studies at this visit. It can take up to 1-2 weeks for results and processing. IF results require follow up or explanation, we will call you with instructions. Clinically stable results will be released to your [facility]. If you have not heard from [place] or cannot find your results in [facility] in  2 weeks please contact our office at [phone].  If you are not yet signed up for [facility], please consider signing up.           [name], DO

## 2019-01-22 NOTE — Patient Instructions (Addendum)
Bring a copy of your living will and/or healthcare power of attorney to your next office visit.  Continue doing brain stimulating activities (puzzles, reading, adult coloring books, staying active) to keep memory sharp.   Discuss medication management with Dr. Kim today-particularly need for BP med, titrating off zoloft, and managing hormone therapy.  Refer to caregiver and in-home assistance resources provided.  Great idea to incorporate walking daily into your routine--routine is very important as we age, especially as we start to struggle with memory issues.  Keep swimming! Thank you for coming in today! Let me know if we can further assist you or your family in reaching your goals and living your best life!   [name] , Thank you for taking time to come for your Medicare Wellness Visit. I appreciate your ongoing commitment to your health goals. Please review the following plan we discussed and let me know if I can assist you in the future.   These are the goals we discussed: Goals    . patient     Getting up happy and feeling good     . Patient Stated     Continue to enjoy your reprieve and good feelings  Enjoying your life!    . Patient Stated     Wean off medications I don't need! Return to walking routine, sticking to a daily routine       This is a list of the screening recommended for you and due dates:  Health Maintenance  Topic Date Due  . Mammogram  11/03/2019  . Tetanus Vaccine  10/31/2024  . Flu Shot  Completed  . DEXA scan (bone density measurement)  Completed  . Pneumonia vaccines  Completed       Fall Prevention in the Home, Adult Falls can cause injuries. They can happen to people of all ages. There are many things you can do to make your home safe and to help prevent falls. Ask for help when making these changes, if needed. What actions can I take to prevent falls? General Instructions  Use good lighting in all rooms. Replace any light bulbs that  burn out.  Turn on the lights when you go into a dark area. Use night-lights.  Keep items that you use often in easy-to-reach places. Lower the shelves around your home if necessary.  Set up your furniture so you have a clear path. Avoid moving your furniture around.  Do not have throw rugs and other things on the floor that can make you trip.  Avoid walking on wet floors.  If any of your floors are uneven, fix them.  Add color or contrast paint or tape to clearly mark and help you see: ? Any grab bars or handrails. ? First and last steps of stairways. ? Where the edge of each step is.  If you use a stepladder: ? Make sure that it is fully opened. Do not climb a closed stepladder. ? Make sure that both sides of the stepladder are locked into place. ? Ask someone to hold the stepladder for you while you use it.  If there are any pets around you, be aware of where they are. What can I do in the bathroom?      Keep the floor dry. Clean up any water that spills onto the floor as soon as it happens.  Remove soap buildup in the tub or shower regularly.  Use non-skid mats or decals on the floor of the tub or shower.    Attach bath mats securely with double-sided, non-slip rug tape.  If you need to sit down in the shower, use a plastic, non-slip stool.  Install grab bars by the toilet and in the tub and shower. Do not use towel bars as grab bars. What can I do in the bedroom?  Make sure that you have a light by your bed that is easy to reach.  Do not use any sheets or blankets that are too big for your bed. They should not hang down onto the floor.  Have a firm chair that has side arms. You can use this for support while you get dressed. What can I do in the kitchen?  Clean up any spills right away.  If you need to reach something above you, use a strong step stool that has a grab bar.  Keep electrical cords out of the way.  Do not use floor polish or wax that makes  floors slippery. If you must use wax, use non-skid floor wax. What can I do with my stairs?  Do not leave any items on the stairs.  Make sure that you have a light switch at the top of the stairs and the bottom of the stairs. If you do not have them, ask someone to add them for you.  Make sure that there are handrails on both sides of the stairs, and use them. Fix handrails that are broken or loose. Make sure that handrails are as long as the stairways.  Install non-slip stair treads on all stairs in your home.  Avoid having throw rugs at the top or bottom of the stairs. If you do have throw rugs, attach them to the floor with carpet tape.  Choose a carpet that does not hide the edge of the steps on the stairway.  Check any carpeting to make sure that it is firmly attached to the stairs. Fix any carpet that is loose or worn. What can I do on the outside of my home?  Use bright outdoor lighting.  Regularly fix the edges of walkways and driveways and fix any cracks.  Remove anything that might make you trip as you walk through a door, such as a raised step or threshold.  Trim any bushes or trees on the path to your home.  Regularly check to see if handrails are loose or broken. Make sure that both sides of any steps have handrails.  Install guardrails along the edges of any raised decks and porches.  Clear walking paths of anything that might make someone trip, such as tools or rocks.  Have any leaves, snow, or ice cleared regularly.  Use sand or salt on walking paths during winter.  Clean up any spills in your garage right away. This includes grease or oil spills. What other actions can I take?  Wear shoes that: ? Have a low heel. Do not wear high heels. ? Have rubber bottoms. ? Are comfortable and fit you well. ? Are closed at the toe. Do not wear open-toe sandals.  Use tools that help you move around (mobility aids) if they are needed. These  include: ? Canes. ? Walkers. ? Scooters. ? Crutches.  Review your medicines with your doctor. Some medicines can make you feel dizzy. This can increase your chance of falling. Ask your doctor what other things you can do to help prevent falls. Where to find more information  Centers for Disease Control and Prevention, [facility]: https://garcia.biz/  [employer] on Aging: BrainJudge.co.uk Contact  a doctor if:  You are afraid of falling at home.  You feel weak, drowsy, or dizzy at home.  You fall at home. Summary  There are many simple things that you can do to make your home safe and to help prevent falls.  Ways to make your home safe include removing tripping hazards and installing grab bars in the bathroom.  Ask for help when making these changes in your home. This information is not intended to replace advice given to you by your health care provider. Make sure you discuss any questions you have with your health care provider. Document Released: 09/24/2009 Document Revised: 07/13/2017 Document Reviewed: 07/13/2017 Elsevier Interactive Patient Education  2019 [place] Maintenance, Female Adopting a healthy lifestyle and getting preventive care can go a long way to promote health and wellness. Talk with your health care provider about what schedule of regular examinations is right for you. This is a good chance for you to check in with your provider about disease prevention and staying healthy. In between checkups, there are plenty of things you can do on your own. Experts have done a lot of research about which lifestyle changes and preventive measures are most likely to keep you healthy. Ask your health care provider for more information. Weight and diet Eat a healthy diet  Be sure to include plenty of vegetables, fruits, low-fat dairy products, and lean protein.  Do not eat a lot of foods high in solid fats, added sugars, or salt.  Get regular  exercise. This is one of the most important things you can do for your health. ? Most adults should exercise for at least 150 minutes each week. The exercise should increase your heart rate and make you sweat (moderate-intensity exercise). ? Most adults should also do strengthening exercises at least twice a week. This is in addition to the moderate-intensity exercise. Maintain a healthy weight  Body mass index (BMI) is a measurement that can be used to identify possible weight problems. It estimates body fat based on height and weight. Your health care provider can help determine your BMI and help you achieve or maintain a healthy weight.  For females 26 years of age and older: ? A BMI below 18.5 is considered underweight. ? A BMI of 18.5 to 24.9 is normal. ? A BMI of 25 to 29.9 is considered overweight. ? A BMI of 30 and above is considered obese. Watch levels of cholesterol and blood lipids  You should start having your blood tested for lipids and cholesterol at 81 years of age, then have this test every 5 years.  You may need to have your cholesterol levels checked more often if: ? Your lipid or cholesterol levels are high. ? You are older than 81 years of age. ? You are at high risk for heart disease. Cancer screening Lung Cancer  Lung cancer screening is recommended for adults 3-52 years old who are at high risk for lung cancer because of a history of smoking.  A yearly low-dose CT scan of the lungs is recommended for people who: ? Currently smoke. ? Have quit within the past 15 years. ? Have at least a 30-pack-year history of smoking. A pack year is smoking an average of one pack of cigarettes a day for 1 year.  Yearly screening should continue until it has been 15 years since you quit.  Yearly screening should stop if you develop a health problem that would prevent you from having lung  cancer treatment. Breast Cancer  Practice breast self-awareness. This means  understanding how your breasts normally appear and feel.  It also means doing regular breast self-exams. Let your health care provider know about any changes, no matter how small.  If you are in your 20s or 30s, you should have a clinical breast exam (CBE) by a health care provider every 1-3 years as part of a regular health exam.  If you are 40 or older, have a CBE every year. Also consider having a breast X-ray (mammogram) every year.  If you have a family history of breast cancer, talk to your health care provider about genetic screening.  If you are at high risk for breast cancer, talk to your health care provider about having an MRI and a mammogram every year.  Breast cancer gene (BRCA) assessment is recommended for women who have family members with BRCA-related cancers. BRCA-related cancers include: ? Breast. ? Ovarian. ? Tubal. ? Peritoneal cancers.  Results of the assessment will determine the need for genetic counseling and BRCA1 and BRCA2 testing. Cervical Cancer Your health care provider may recommend that you be screened regularly for cancer of the pelvic organs (ovaries, uterus, and vagina). This screening involves a pelvic examination, including checking for microscopic changes to the surface of your cervix (Pap test). You may be encouraged to have this screening done every 3 years, beginning at age 21.  For women ages 30-65, health care providers may recommend pelvic exams and Pap testing every 3 years, or they may recommend the Pap and pelvic exam, combined with testing for human papilloma virus (HPV), every 5 years. Some types of HPV increase your risk of cervical cancer. Testing for HPV may also be done on women of any age with unclear Pap test results.  Other health care providers may not recommend any screening for nonpregnant women who are considered low risk for pelvic cancer and who do not have symptoms. Ask your health care provider if a screening pelvic exam is right  for you.  If you have had past treatment for cervical cancer or a condition that could lead to cancer, you need Pap tests and screening for cancer for at least 20 years after your treatment. If Pap tests have been discontinued, your risk factors (such as having a new sexual partner) need to be reassessed to determine if screening should resume. Some women have medical problems that increase the chance of getting cervical cancer. In these cases, your health care provider may recommend more frequent screening and Pap tests. Colorectal Cancer  This type of cancer can be detected and often prevented.  Routine colorectal cancer screening usually begins at 81 years of age and continues through 81 years of age.  Your health care provider may recommend screening at an earlier age if you have risk factors for colon cancer.  Your health care provider may also recommend using home test kits to check for hidden blood in the stool.  A small camera at the end of a tube can be used to examine your colon directly (sigmoidoscopy or colonoscopy). This is done to check for the earliest forms of colorectal cancer.  Routine screening usually begins at age 50.  Direct examination of the colon should be repeated every 5-10 years through 81 years of age. However, you may need to be screened more often if early forms of precancerous polyps or small growths are found. Skin Cancer  Check your skin from head to toe regularly.  Tell your   health care provider about any new moles or changes in moles, especially if there is a change in a mole's shape or color.  Also tell your health care provider if you have a mole that is larger than the size of a pencil eraser.  Always use sunscreen. Apply sunscreen liberally and repeatedly throughout the day.  Protect yourself by wearing long sleeves, pants, a wide-brimmed hat, and sunglasses whenever you are outside. Heart disease, diabetes, and high blood pressure  High blood  pressure causes heart disease and increases the risk of stroke. High blood pressure is more likely to develop in: ? People who have blood pressure in the high end of the normal range (130-139/85-89 mm Hg). ? People who are overweight or obese. ? People who are African American.  If you are 40-58 years of age, have your blood pressure checked every 3-5 years. If you are 51 years of age or older, have your blood pressure checked every year. You should have your blood pressure measured twice-once when you are at a hospital or clinic, and once when you are not at a hospital or clinic. Record the average of the two measurements. To check your blood pressure when you are not at a hospital or clinic, you can use: ? An automated blood pressure machine at a pharmacy. ? A home blood pressure monitor.  If you are between 70 years and 47 years old, ask your health care provider if you should take aspirin to prevent strokes.  Have regular diabetes screenings. This involves taking a blood sample to check your fasting blood sugar level. ? If you are at a normal weight and have a low risk for diabetes, have this test once every three years after 81 years of age. ? If you are overweight and have a high risk for diabetes, consider being tested at a younger age or more often. Preventing infection Hepatitis B  If you have a higher risk for hepatitis B, you should be screened for this virus. You are considered at high risk for hepatitis B if: ? You were born in a country where hepatitis B is common. Ask your health care provider which countries are considered high risk. ? Your parents were born in a high-risk country, and you have not been immunized against hepatitis B (hepatitis B vaccine). ? You have HIV or AIDS. ? You use needles to inject street drugs. ? You live with someone who has hepatitis B. ? You have had sex with someone who has hepatitis B. ? You get hemodialysis treatment. ? You take certain  medicines for conditions, including cancer, organ transplantation, and autoimmune conditions. Hepatitis C  Blood testing is recommended for: ? Everyone born from [birth year] through 1963. ? Anyone with known risk factors for hepatitis C. Sexually transmitted infections (STIs)  You should be screened for sexually transmitted infections (STIs) including gonorrhea and chlamydia if: ? You are sexually active and are younger than 81 years of age. ? You are older than 81 years of age and your health care provider tells you that you are at risk for this type of infection. ? Your sexual activity has changed since you were last screened and you are at an increased risk for chlamydia or gonorrhea. Ask your health care provider if you are at risk.  If you do not have HIV, but are at risk, it may be recommended that you take a prescription medicine daily to prevent HIV infection. This is called pre-exposure prophylaxis (PrEP). You are  considered at risk if: ? You are sexually active and do not regularly use condoms or know the HIV status of your partner(s). ? You take drugs by injection. ? You are sexually active with a partner who has HIV. Talk with your health care provider about whether you are at high risk of being infected with HIV. If you choose to begin PrEP, you should first be tested for HIV. You should then be tested every 3 months for as long as you are taking PrEP. Pregnancy  If you are premenopausal and you may become pregnant, ask your health care provider about preconception counseling.  If you may become pregnant, take 400 to 800 micrograms (mcg) of folic acid every day.  If you want to prevent pregnancy, talk to your health care provider about birth control (contraception). Osteoporosis and menopause  Osteoporosis is a disease in which the bones lose minerals and strength with aging. This can result in serious bone fractures. Your risk for osteoporosis can be identified using a bone density  scan.  If you are 65 years of age or older, or if you are at risk for osteoporosis and fractures, ask your health care provider if you should be screened.  Ask your health care provider whether you should take a calcium or vitamin D supplement to lower your risk for osteoporosis.  Menopause may have certain physical symptoms and risks.  Hormone replacement therapy may reduce some of these symptoms and risks. Talk to your health care provider about whether hormone replacement therapy is right for you. Follow these instructions at home:  Schedule regular health, dental, and eye exams.  Stay current with your immunizations.  Do not use any tobacco products including cigarettes, chewing tobacco, or electronic cigarettes.  If you are pregnant, do not drink alcohol.  If you are breastfeeding, limit how much and how often you drink alcohol.  Limit alcohol intake to no more than 1 drink per day for nonpregnant women. One drink equals 12 ounces of beer, 5 ounces of wine, or 1 ounces of hard liquor.  Do not use street drugs.  Do not share needles.  Ask your health care provider for help if you need support or information about quitting drugs.  Tell your health care provider if you often feel depressed.  Tell your health care provider if you have ever been abused or do not feel safe at home. This information is not intended to replace advice given to you by your health care provider. Make sure you discuss any questions you have with your health care provider. Document Released: 06/13/2011 Document Revised: 05/05/2016 Document Reviewed: 09/01/2015 Elsevier Interactive Patient Education  2019 [employer]  

## 2019-01-27 DIAGNOSIS — F0390 Unspecified dementia without behavioral disturbance: Secondary | ICD-10-CM | POA: Insufficient documentation

## 2019-01-27 HISTORY — DX: Unspecified dementia, unspecified severity, without behavioral disturbance, psychotic disturbance, mood disturbance, and anxiety: F03.90

## 2019-02-04 ENCOUNTER — Ambulatory Visit (INDEPENDENT_AMBULATORY_CARE_PROVIDER_SITE_OTHER): Payer: Medicare Other | Admitting: Psychology

## 2019-02-04 DIAGNOSIS — F411 Generalized anxiety disorder: Secondary | ICD-10-CM

## 2019-02-12 ENCOUNTER — Ambulatory Visit: Payer: Self-pay | Admitting: Neurology

## 2019-02-12 ENCOUNTER — Encounter

## 2019-02-21 ENCOUNTER — Ambulatory Visit (INDEPENDENT_AMBULATORY_CARE_PROVIDER_SITE_OTHER): Payer: Medicare Other | Admitting: Psychology

## 2019-02-21 DIAGNOSIS — F411 Generalized anxiety disorder: Secondary | ICD-10-CM

## 2019-03-04 ENCOUNTER — Ambulatory Visit (INDEPENDENT_AMBULATORY_CARE_PROVIDER_SITE_OTHER): Payer: Medicare Other | Admitting: Psychology

## 2019-03-04 DIAGNOSIS — F411 Generalized anxiety disorder: Secondary | ICD-10-CM

## 2019-03-11 ENCOUNTER — Telehealth: Payer: Self-pay | Admitting: Psychiatry

## 2019-03-11 NOTE — Telephone Encounter (Signed)
FYI We had to cancel her appt for 4/2, but her daughter indicated that they had discontinued the medications so do not feel they need to reschedule at this time.  Will call if they need another appointment.

## 2019-03-14 ENCOUNTER — Other Ambulatory Visit: Payer: Self-pay

## 2019-03-14 ENCOUNTER — Ambulatory Visit: Payer: Medicare Other | Admitting: Psychiatry

## 2019-03-14 ENCOUNTER — Telehealth (INDEPENDENT_AMBULATORY_CARE_PROVIDER_SITE_OTHER): Payer: Medicare Other | Admitting: Neurology

## 2019-03-14 DIAGNOSIS — F0281 Dementia in other diseases classified elsewhere with behavioral disturbance: Secondary | ICD-10-CM | POA: Diagnosis not present

## 2019-03-14 DIAGNOSIS — R251 Tremor, unspecified: Secondary | ICD-10-CM

## 2019-03-14 DIAGNOSIS — G301 Alzheimer's disease with late onset: Secondary | ICD-10-CM

## 2019-03-14 DIAGNOSIS — F419 Anxiety disorder, unspecified: Secondary | ICD-10-CM

## 2019-03-14 MED ORDER — RIVASTIGMINE TARTRATE 1.5 MG PO CAPS: 1.5000 mg | ORAL_CAPSULE | Freq: Two times a day (BID) | ORAL | 11 refills | Status: DC

## 2019-03-14 NOTE — Progress Notes (Signed)
Virtual Visit via Video Note The purpose of this virtual visit is to provide medical care while limiting exposure to the novel coronavirus.    Consent was obtained for video visit:  Yes.   Answered questions that patient had about telehealth interaction:  Yes.   I discussed the limitations, risks, security and privacy concerns of performing an evaluation and management service by telemedicine. I also discussed with the patient that there may be a patient responsible charge related to this service. The patient expressed understanding and agreed to proceed.  Pt location: Home Physician Location: office Name of referring provider:  [name], DO I connected with [name] at patients initiation/request on 03/14/2019 at  9:00 AM EDT by video enabled telemedicine application and verified that I am speaking with the correct person using two identifiers. Pt MRN:  [id number] Pt DOB:  13-Nov-1936 Video Participants:  [name];  Daughters [name] and [name], son-in-law [name]  History of Present Illness:  The patient was last seen in July 2019. Since then, she underwent Neuropsychological testing with Dr. 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 Testing showed average (if not above average to superior range) functioning across most cognitive domains, hwoever there were severe deficits in delayed free recall and recognition of previously learned material. From an emotional standpoint, there appears to be at least a moderate degree of recent anxiety. Cholinesterase inhibitor treatment and anxiety treatment were recommended. She was started on Donepezil, however 4 days after starting medication, she started having delusions. She was fully convinced that one of her cats was at the vet clinic and needed to be picked up. She drove to the Cat clinic and said things that were totally out of character. Staff called [name] about what  occurred. She was having other delusions such as thinking her house had moved or was picked up and moved to a different location. She was crying and shaky. After stopping the Donepezil, the extreme emotions have quieted down but she is still having some delusions and possible hallucinations. Her family moved her to [place] last week after an incident when she called [name] at night, wanting to know where she was spending the night or who was staying with her. They had a 25 minute conversation where she was not making sense, she seemed to think she was still employed, asking her daughter who was in charge of this house, "am I responsible for all the people living in this house? When things need to be fixed, she used to call maintenance." She kept referencing the company where she used to work. [name] called her other [name] who had a similar conversation. They decided they ddi not want her to be alone. Since staying at [facility], she is eating and sleeping better. Every morning though, she is packed up and she strips the bed, saying she is ready to go home, she says she has already been there for 6 weeks when she has only been there for 6 days. She appears to be having visual hallucinations, they were on the porch one time and she said to look at those buildings with windows (these do not exist). Another time she told [name] the lights were on in his car outside (they were not). No auditory hallucinations. Family has also noticed continued tremor in her left hand for the past 3 years. She has lost her facial expression and shuffles when walking. They report she has  decreased arm swing.   After stopping Donepezil, family also decided to wean off the Sertraline which she was taking for anxiety. On her initial visit, she had significant anxiety. Sertraline seemed to help, but [name] thinks that the different medications tried in the past 3 years (Lexapro, Prozac, and Zoloft) have caused more  side effects. They were worried that side effects listed were confusion, shaking, worsened anxiety. She has been off Sertraline for the past month and the confusion and hallucinations have not improved, if any they have escalated. [name] states "anxiety is what is so difficult for me." [name] has noticed that mornings and evenings are times she is anxious and her mind is not clear, she seems to be pretty good in the afternoons. [name] started giving her 1.5mg  melatonin every night. They report that she was assessed for long-term care which was denied, she "aced the test" and was deemed not to fulfill criteria for severe limitation of cognitive and behavioral condition.   HPI 12/18/2017: This is a pleasant 83 yo RH woman with a history of hypertension, anxiety, depression, who presented for evaluation of memory loss. She feels her memory is "a little shaky." She endorses a lot of anxiety, and states it is causing her not to recall things and she does not understand it. Family started noticing memory changes around 2 years ago. She has been the main caregiver for her husband with dementia, who had been in and out of the hospital several times. She was "not dealing with things as well as I should." For instance, 2 years ago she was taking care of the managing their family home, but they noticed she was not keeping up with it, and turned it over to her sister. Her daughter took over finances in the Spring 2018 because she was afraid she would forget and she had never done the taxes in the past. She lives alone and denies missing medications. She denies getting lost driving. She has 3 cats at home that are cared for, she does not forget to feed them. She has no difficulties running the household, doing laundry and yardwork. She has learned how to use the leafblower. Their main concern is there anxiety and depression have "really ramped up." She is so stressed and scattered, that she could not stay on task. Family  started noticing anxiety around 4-5 years ago, but in January 2017 she was "in breakdown territory" and started Lexapro and counseling. It appears she was on a very low dose due to concern for side effects. She would wake up every morning between 3-4 AM with a panic attack, unable to reason with herself, shaky. Family reports that she would forget what time they told her she would be picked up, even if it was in a text message in front of her. They have noticed lack of focus and concentration. She would ask the same question about this doctor's appointment and was quite anxious about today's visit. Family reports she "tends to catastrophize things." No paranoia or hallucinations. She was switched from Lexapro to Prozac at the family's request, and "something about Prozac bothers me all in my head."   She denies any headaches, vertigo, diplopia, dysarthria/dysphagia, neck/back pain, focal numbness/tingling/weakness, bowel/bladder dysfunction. She reports a "mental dizziness," where she states "my brain just feels full." She has tremors, R>L. She had lost her sense of smell after sinus surgery many years ago. Her mother had dementia in her 37s, her older sister has memory issues. No history of significant  head injuries, no alcohol use.    Observations/Objective:   General: Awake, alert, oriented to place and day of week. She states it is September 2017. She is noted to have masked facies/flat affect. She is able to speak in full sentences, sometimes with decreased fluency. No aphasia or dysarthria. Intact comprehension, able to follow 2-step commands. Cranial nerves: Pupils equal, round. Extraocular movements intact with no nystagmus. No facial asymmetry. Motor: There is no clear resting tremor (family agrees that none is present today). She has a mild postural tremor, no endpoint tremor. Unable to test tone. She does not appear bradykinetic with finger tapping but seems to orbit around the left arm (possibly  indicating mild left arm weakness). She is noted to have a hunched posture when walking with reduced arm swing, however has good strides, no shuffling gait today (which family agrees with, they report they have seen her shuffling). I cannot visualize a tremor while ambulating but family reports there is a mild hand tremor.  Montreal Cognitive Assessment (Blind) 03/14/2019  Visuospatial/ Executive (0/5) 0   Naming (0/3) 0  Attention: Read list of digits (0/2) 2  Attention: Read list of letters (0/1) 1  Attention: Serial 7 subtraction starting at 100 (0/3) 3  Language: Repeat phrase (0/2) 2  Language : Fluency (0/1) 1  Abstraction (0/2) 1  Delayed Recall (0/5) 0  Orientation (0/6) 3  Total 13/22    Assessment and Plan:   This is an 83 yo RH woman with a history of hypertension, anxiety, depression, who presented for evaluation of worsening memory. 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 MOCA score today 13/22. She seemed to have delusions on Donepezil, however she continues to have behavioral changes since stopping it 1 month ago. Sertraline was also weaned off due to concern for side effects, she reports anxiety is most bothersome for her currently. We had an extensive discussion about the diagnosis and medications. We discussed expectations from cholinesterase inhibitors, patient and family willing to try low dose Rivastigmine 1.5mg  BID, which can also hopefully help with behavioral changes that are worrisome for family. I discussed the importance of managing the anxiety better, they will call our office for an update in a month and if no side effects on Rivastigmine, we could potentially start either Wellbutrin or Buspar. Family is concerned about the possibility of Parkinson's disease, it is difficult to do a full exam on video, she does not have clear shuffling gait noted but  does have reduced arm swing and decreased facial expressions. She has a resting tremor on the left hand on prior visit. We may do a trial of Sinemet in the future. Family expressed concerns about living alone, she is currently staying with [name] during the pandemic, continue to monitor and re-evaluate. She was advised not to drive. Follow-up in 4-5 months, call for any changes.   Follow Up Instructions:   -I discussed the assessment and treatment plan with the patient and family. The patient/family was provided an opportunity to ask questions and all were answered. The patient/family agreed with the plan and demonstrated an understanding of the instructions.   The patient was advised to call back or seek an in-person evaluation if the symptoms worsen or if the condition fails to improve as anticipated.    Total Time spent in visit with the patient was 45 minutes, of which more than 50% of the time was spent  in counseling and/or coordinating care on the above.   Pt understands and agrees with the plan of care outlined.     Cameron Sprang, MD

## 2019-03-20 ENCOUNTER — Ambulatory Visit: Payer: Self-pay | Admitting: Neurology

## 2019-03-21 ENCOUNTER — Ambulatory Visit (INDEPENDENT_AMBULATORY_CARE_PROVIDER_SITE_OTHER): Payer: Medicare Other | Admitting: Psychology

## 2019-03-21 DIAGNOSIS — F411 Generalized anxiety disorder: Secondary | ICD-10-CM

## 2019-04-03 ENCOUNTER — Other Ambulatory Visit: Payer: Self-pay | Admitting: Family Medicine

## 2019-04-03 DIAGNOSIS — I10 Essential (primary) hypertension: Secondary | ICD-10-CM

## 2019-04-04 ENCOUNTER — Ambulatory Visit (INDEPENDENT_AMBULATORY_CARE_PROVIDER_SITE_OTHER): Payer: Medicare Other | Admitting: Psychology

## 2019-04-04 DIAGNOSIS — F411 Generalized anxiety disorder: Secondary | ICD-10-CM | POA: Diagnosis not present

## 2019-04-08 MED ORDER — RIVASTIGMINE TARTRATE 3 MG PO CAPS: 3.0000 mg | ORAL_CAPSULE | Freq: Two times a day (BID) | ORAL | 11 refills | Status: DC

## 2019-04-22 ENCOUNTER — Ambulatory Visit (INDEPENDENT_AMBULATORY_CARE_PROVIDER_SITE_OTHER): Payer: Medicare Other | Admitting: Psychology

## 2019-04-22 DIAGNOSIS — F411 Generalized anxiety disorder: Secondary | ICD-10-CM

## 2019-05-09 DIAGNOSIS — R921 Mammographic calcification found on diagnostic imaging of breast: Secondary | ICD-10-CM | POA: Diagnosis not present

## 2019-05-09 DIAGNOSIS — Z8041 Family history of malignant neoplasm of ovary: Secondary | ICD-10-CM | POA: Diagnosis not present

## 2019-05-09 DIAGNOSIS — C50919 Malignant neoplasm of unspecified site of unspecified female breast: Secondary | ICD-10-CM | POA: Diagnosis not present

## 2019-05-13 ENCOUNTER — Ambulatory Visit (INDEPENDENT_AMBULATORY_CARE_PROVIDER_SITE_OTHER): Payer: Medicare Other | Admitting: Psychology

## 2019-05-13 DIAGNOSIS — F411 Generalized anxiety disorder: Secondary | ICD-10-CM | POA: Diagnosis not present

## 2019-05-14 ENCOUNTER — Encounter: Payer: Self-pay | Admitting: Psychiatry

## 2019-05-14 ENCOUNTER — Ambulatory Visit (INDEPENDENT_AMBULATORY_CARE_PROVIDER_SITE_OTHER): Payer: Medicare Other | Admitting: Psychiatry

## 2019-05-14 ENCOUNTER — Other Ambulatory Visit: Payer: Self-pay

## 2019-05-14 DIAGNOSIS — F330 Major depressive disorder, recurrent, mild: Secondary | ICD-10-CM

## 2019-05-14 DIAGNOSIS — F411 Generalized anxiety disorder: Secondary | ICD-10-CM | POA: Diagnosis not present

## 2019-05-14 NOTE — Progress Notes (Signed)
[name] [id number] April 08, 1936 83 y.o.  Subjective:   Patient ID:  [name] is a 83 y.o. (DOB [birth year]) female.  Chief Complaint:  Chief Complaint  Patient presents with  . Anxiety  . Depression    HPI [name] presents to the office today for follow-up of anxiety and depression.   Accompanied by 2 daughters. Daughter reports that pt had neuropsych testing and this indicated mild to moderate dementia. Started Aricept in January and had adverse reaction to include hallucinations. Started Rivastigmine in April. Tapered off of Sertraline by late February. Daughter reports that therapist and PCP have recommended medication for anxiety and depression. Daughters report that primary issue is anxiety and that depression is secondary. Daughters report that pt has frequent worry and rumination. Daughters reports that pt is occasionally tearful.   She reports that she feels nervous and shaky in the morning and reports that this has been improving some in the last week. Denies any recent full blown panic attacks. She reports that she has some worry at the start of the day and thinking about what the day holds. Pt reports that having someone to talk to helps her anxiety. Pt reports that she is experiencing depression "intermittently" and attributes this to not being able to drive and be as independent as she is used to. She reports adequate sleep. She reports that her appetite has been ok. She reports that her energy has been good. Motivation has also been good. She reports that she tries to stay active to help manage anxiety. She reports poor concentration a few weeks ago and that this is improving and has started reading some again. Denies anhedonia. Denies SI.  Has had a caregiver come in recently. Has not been using her stove and uses the microwave to prepare meals. They are considering ALF placement in the future.   Continues to see [name].  Past Psychiatric Medication  Trials: Daughters report "mixed results" with past medications.  Sertaline Paxil Lexapro- Started in 2017 and took for about a year Prozac- Took after Lexapro and took for about a year Aricept- Adverse reaction Exelon Ativan- Adverse reaction  Review of Systems:  Review of Systems  Musculoskeletal: Negative for gait problem.  Neurological: Positive for tremors.       She reports left hand tremor  Psychiatric/Behavioral:       Please refer to HPI    Medications: I have reviewed the patient's current medications.  Current Outpatient Medications  Medication Sig Dispense Refill  . CALCIUM CITRATE-VITAMIN D PO Take by mouth.    . dorzolamide-timolol (COSOPT) 22.3-6.8 MG/ML ophthalmic solution dorzolamide 22.3 mg-timolol 6.8 mg/mL eye drops    . estradiol (ESTRACE) 0.5 MG tablet     . fluticasone (FLONASE) 50 MCG/ACT nasal spray USE 2 SPRAYS INTO BOTH NOSTRILS DAILY. 16 g 3  . hydrochlorothiazide (HYDRODIURIL) 12.5 MG tablet TAKE 1/2 TABLET BY MOUTH EVERY DAY 45 tablet 1  . Multiple Vitamin (MULTIVITAMIN) capsule Take 1 capsule by mouth daily.    . progesterone (PROMETRIUM) 100 MG capsule Take 100 mg by mouth at bedtime.     . rivastigmine (EXELON) 3 MG capsule Take 1 capsule (3 mg total) by mouth 2 (two) times daily. 60 capsule 11  . Acetaminophen (TYLENOL PO) Take by mouth as needed.    [name] EPINEPHrine (EPIPEN) 0.3 mg/0.3 mL SOAJ injection Inject 0.3 mg into the muscle once as needed (for allergic reaction).    [name] estradiol (ESTRACE) 1 MG tablet Take  0.5 mg by mouth at bedtime.     [name] OVER THE COUNTER MEDICATION Take 1 packet by mouth daily. Dissolve one packet in a glass of water each morning. Emergen-C    . pneumococcal 23 valent vaccine (PNU-IMMUNE) 25 MCG/0.5ML injection Pneumovax-23 25 mcg/0.5 mL injection solution  INJECT 0.5 ML INTRAMUSCULARLY AS DIRECTED.     No current facility-administered medications for this visit.     Medication Side Effects: None  Allergies:   Allergies  Allergen Reactions  . Cephalexin     REACTION: diaphoretic and drop in Blood pressure  . Lorazepam Other (See Comments)    amnesia    Past Medical History:  Diagnosis Date  . Allergic rhinitis, cause unspecified   . Contact dermatitis 03/16/2013  . Generalized anxiety disorder 02/19/2013   has seen [name], [name] and now [name] for counseling  . Hemorrhoids, external   . Hot flashes   . Hypertension   . Irritable bowel syndrome   . MDD (major depressive disorder) 03/18/2016  . Other and unspecified hyperlipidemia   . Palpitations 02/19/2013   On-set of symptoms March '14. EKG with NSR     Family History  Problem Relation Age of Onset  . Dementia Mother   . Heart disease Father   . Cancer Sister        breast  . Heart disease Other   . Heart disease Other   . Heart disease Other   . Cancer Sister        breast  . Hypothyroidism Sister   . Asthma Sister   . Rheum arthritis Paternal Grandmother   . Rheum arthritis Daughter     Social History   Socioeconomic History  . Marital status: Married    Spouse name: Not on file  . Number of children: 4  . Years of education: Not on file  . Highest education level: Not on file  Occupational History  . Occupation: Freight forwarder at [employer]    Comment: Retired  Scientific laboratory technician  . Financial resource strain: Not hard at all  . Food insecurity:    Worry: Never true    Inability: Never true  . Transportation needs:    Medical: No    Non-medical: No  Tobacco Use  . Smoking status: Former Smoker    Packs/day: 0.10    Years: 1.00    Pack years: 0.10    Types: Cigarettes    Last attempt to quit: 12/12/1962    Years since quitting: 56.4  . Smokeless tobacco: Never Used  . Tobacco comment: smoked for 3 months in college   Substance and Sexual Activity  . Alcohol use: Not Currently    Comment: rarely  . Drug use: No  . Sexual activity: Not Currently    Partners: Male  Lifestyle  . Physical  activity:    Days per week: 2 days    Minutes per session: 30 min  . Stress: To some extent  Relationships  . Social connections:    Talks on phone: More than three times a week    Gets together: Twice a week    Attends religious service: Not on file    Active member of club or organization: Yes    Attends meetings of clubs or organizations: More than 4 times per year    Relationship status: Widowed  . Intimate partner violence:    Fear of current or ex partner: Not on file    Emotionally abused: Not on file  Physically abused: Not on file    Forced sexual activity: Not on file  Other Topics Concern  . Not on file  Social History Narrative   College grad. Married '61. 4 daughters, 2 grandchildren. 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   Advanced Directives: Has Living Will, HCPOA: Laurelle Skiver [phone]         Pt lives in 2 story home - her husband currently resides in nursing facility      01/22/19: Pt. Lives in [place] home, daughters check in on patient, managing medications   Pt. Still drives, manages yardwork she says   Enjoys swim aerobics at the [facility]    Past Medical History, Surgical history, Social history, and Family history were reviewed and updated as appropriate.   Please see review of systems for further details on the patient's review from today.   Objective:   Physical Exam:  There were no vitals taken for this visit.  Physical Exam  Lab Review:     Component Value Date/Time   NA 135 01/22/2019 1328   K 4.0 01/22/2019 1328   CL 98 01/22/2019 1328   CO2 29 01/22/2019 1328   GLUCOSE 81 01/22/2019 1328   BUN 14 01/22/2019 1328   CREATININE 0.81 01/22/2019 1328   CALCIUM 9.3 01/22/2019 1328   PROT 7.0 05/24/2011 1220   PROT 7.0 05/24/2011 1220   ALBUMIN 4.3 05/24/2011 1220   ALBUMIN 4.3 05/24/2011 1220   AST 18 05/24/2011 1220   AST 18 05/24/2011 1220   ALT 14 05/24/2011 1220   ALT 14 05/24/2011 1220   ALKPHOS 55 05/24/2011 1220    ALKPHOS 55 05/24/2011 1220   BILITOT 1.5 (H) 05/24/2011 1220   BILITOT 1.5 (H) 05/24/2011 1220   GFRNONAA 83 (L) 03/22/2014 1619   GFRAA >90 03/22/2014 1619       Component Value Date/Time   WBC 5.5 01/22/2019 1328   RBC 4.47 01/22/2019 1328   HGB 14.1 01/22/2019 1328   HCT 41.6 01/22/2019 1328   PLT 259.0 01/22/2019 1328   MCV 93.1 01/22/2019 1328   MCH 30.9 03/22/2014 1619   MCHC 33.8 01/22/2019 1328   RDW 13.1 01/22/2019 1328   LYMPHSABS 1.6 08/31/2011 1447   MONOABS 0.4 08/31/2011 1447   EOSABS 0.0 08/31/2011 1447   BASOSABS 0.0 08/31/2011 1447    No results found for: POCLITH, LITHIUM   No results found for: PHENYTOIN, PHENOBARB, VALPROATE, CBMZ   .res Assessment: Plan:   Patient seen for 30 minutes and greater than 50% of visit spent counseling patient and her daughters regarding possible treatment options to include potential benefits, risks, and side effects of BuSpar and Viibryd.  Pt and her daughters report differing views on the necessity of medication considering some possible improvement in pt's mood and anxiety with recent addition of private duty sitter and question if mood may further improve if able to move to ALF and also considering patient's history of adverse effects with medication and limited benefit in the past, while one daughter verbalizes concern that pt has continued to exhibit significant s/s of anxiety and depression since having a sitter.  Family agreed that further discussion and evaluation of response to having private duty sitter is needed before deciding if they consent to treatment with medication at this time.  Provided daughters with names of possible medication options if they were to decide to further research treatment options.  Encourage patient and daughters to contact office if/when they decide potential benefits of  starting medication outweigh potential risks.  Patient to follow-up as needed and based upon decision whether to initiate  treatment.   Generalized anxiety disorder  Mild episode of recurrent major depressive disorder ([place])  Please see After Visit Summary for patient specific instructions.  Future Appointments  Date Time Provider [place]  05/28/2019 12:30 PM Kirtland Bouchard, PhD [facility] None  06/17/2019 12:00 PM [name], MD [facility]  06/24/2019  4:00 PM Kirtland Bouchard, PhD [facility] None  08/14/2019  2:00 PM Cameron Sprang, MD [facility] None    No orders of the defined types were placed in this encounter.     -------------------------------

## 2019-05-18 ENCOUNTER — Encounter: Payer: Self-pay | Admitting: Psychiatry

## 2019-05-20 ENCOUNTER — Telehealth: Payer: Self-pay | Admitting: Psychiatry

## 2019-05-20 NOTE — Telephone Encounter (Signed)
Patient called and said that she wants you to lift her driving resticitions. She is not even allowed to move thew car out of the driveway. Daughters have her key. Please call her she is very upset. [phone]

## 2019-05-24 ENCOUNTER — Telehealth: Payer: Self-pay | Admitting: Psychiatry

## 2019-05-24 ENCOUNTER — Telehealth: Payer: Self-pay | Admitting: Neurology

## 2019-05-24 DIAGNOSIS — F411 Generalized anxiety disorder: Secondary | ICD-10-CM

## 2019-05-24 DIAGNOSIS — F330 Major depressive disorder, recurrent, mild: Secondary | ICD-10-CM

## 2019-05-24 MED ORDER — SERTRALINE HCL 25 MG PO TABS: ORAL_TABLET | ORAL | 0 refills | Status: DC

## 2019-05-24 NOTE — Telephone Encounter (Signed)
Pt's daughter [name] called to inform that pt's condition has worsened, in particular depression as she has been saying suicidal things. Pt's daughter stated that since pt has not been able to drive she has become more combative. She would like some advise.

## 2019-05-24 NOTE — Telephone Encounter (Signed)
[name] made aware and will call next week to schedule the appt.

## 2019-05-24 NOTE — Telephone Encounter (Signed)
Left VM for her to return my call 

## 2019-05-24 NOTE — Telephone Encounter (Signed)
Pls let daughter know that with increasing behavioral/psychological changes, especially with suicidal ideation, she needs to let her psychiatrist know for medication adjustment. Thanks

## 2019-05-24 NOTE — Telephone Encounter (Signed)
Spoke with pt daughter she will call [facility] to let them know what is going on with her mom and to see what needs to be done for her at this time.

## 2019-05-24 NOTE — Telephone Encounter (Signed)
Patient's daughter Lelon Frohlich stated she would like to get her back on the Sertraline 25 mg., daily to be sent to [facility] on [address] had a disturbing report from caregiver, mom had suicidal thought

## 2019-05-28 ENCOUNTER — Ambulatory Visit (INDEPENDENT_AMBULATORY_CARE_PROVIDER_SITE_OTHER): Payer: Medicare Other | Admitting: Psychology

## 2019-05-28 DIAGNOSIS — F411 Generalized anxiety disorder: Secondary | ICD-10-CM | POA: Diagnosis not present

## 2019-06-06 ENCOUNTER — Ambulatory Visit: Payer: Medicare Other | Admitting: Psychiatry

## 2019-06-16 ENCOUNTER — Other Ambulatory Visit: Payer: Self-pay | Admitting: Psychiatry

## 2019-06-16 DIAGNOSIS — F411 Generalized anxiety disorder: Secondary | ICD-10-CM

## 2019-06-16 DIAGNOSIS — F330 Major depressive disorder, recurrent, mild: Secondary | ICD-10-CM

## 2019-06-17 ENCOUNTER — Encounter: Payer: Self-pay | Admitting: Family Medicine

## 2019-06-17 ENCOUNTER — Other Ambulatory Visit: Payer: Self-pay

## 2019-06-17 ENCOUNTER — Ambulatory Visit: Payer: Self-pay | Admitting: Family Medicine

## 2019-06-17 ENCOUNTER — Ambulatory Visit (INDEPENDENT_AMBULATORY_CARE_PROVIDER_SITE_OTHER): Payer: Medicare Other | Admitting: Family Medicine

## 2019-06-17 DIAGNOSIS — G3184 Mild cognitive impairment, so stated: Secondary | ICD-10-CM | POA: Diagnosis not present

## 2019-06-17 DIAGNOSIS — N329 Bladder disorder, unspecified: Secondary | ICD-10-CM

## 2019-06-17 DIAGNOSIS — F330 Major depressive disorder, recurrent, mild: Secondary | ICD-10-CM | POA: Diagnosis not present

## 2019-06-17 DIAGNOSIS — R232 Flushing: Secondary | ICD-10-CM

## 2019-06-17 DIAGNOSIS — E785 Hyperlipidemia, unspecified: Secondary | ICD-10-CM | POA: Diagnosis not present

## 2019-06-17 DIAGNOSIS — F411 Generalized anxiety disorder: Secondary | ICD-10-CM

## 2019-06-17 DIAGNOSIS — I10 Essential (primary) hypertension: Secondary | ICD-10-CM

## 2019-06-17 NOTE — Progress Notes (Signed)
Virtual Visit via Video Note  I connected with [name]   on 06/17/19 at 12:00 PM EDT by a video enabled telemedicine application and verified that I am speaking with the correct person using two identifiers.  Location patient: home Location provider:work office Persons participating in the virtual visit: patient, provider  I discussed the limitations of evaluation and management by telemedicine and the availability of in person appointments. The patient expressed understanding and agreed to proceed.   [name] DOB: 1936-11-16 Encounter date: 06/17/2019  This is a 83 y.o. female who presents to establish care. No chief complaint on file.  Here with daughter (anne-marie).   History of present illness: WYO:VZCH 6.25mg  daily. They do check blood pressures at home. Wondering if she needs to be on medication. BP now is 153/81 (very anxious at that point, shaking); 149/82 ten minutes later. Was started on hctz after ER visit in years past for high bp. At that time there was significant anxiety/stress with taking care of sick spouse.   Allergies:flonase hasn't been needed this allergy season, even with her working outside in yard regularly.  HL: not on medication for cholesterol. Stays busy with gardening, yardwork.   Anxiety/Depression:zoloft 25mg  daily stopped (see below) Followed in April with Dr. Delice Lesch - increased concerns for memory issue; had complete testing which demonstrated some mild memory impairment. Delusions on aricept; and thought some masked facies, tremor so strated on rivastigmine. Started back on zoloft June 16th. Daughter states that mood seems to be lightening up some. Mood does not feel quite as "dark" for her. Has companion/"helper" since end of May. Someone from [employer] - about 4 hours/day. Come in morning. This is usually the time that things are harder for her. Mood has been issue since 2017. Helps around house. Not needing help with bathing/toileting.  Just helps having someone there for her in morning and ease anxiety. They feel she is doing well with this. Per Herbert Pun - she isn't doing things that she was was doing easily before. This was a frustrating change for her. Rivastigmine was started in April. Has follow up with neurology in September. They feel that it is helping memory and cognitive wise. Follows with [facility] as well. She is frustrated with not being able to drive and feels this is isolating for her.   Follows with Dr. Philis Pique for gyn needs:hot flashes: estradiol. Hasn't seen her this year; but is due for appointment.    Past Medical History:  Diagnosis Date  . Allergic rhinitis, cause unspecified   . Contact dermatitis 03/16/2013  . Generalized anxiety disorder 02/19/2013   has seen [name], [name] and now [name] for counseling  . Hemorrhoids, external   . Hot flashes   . Hypertension   . Irritable bowel syndrome   . MDD (major depressive disorder) 03/18/2016  . Other and unspecified hyperlipidemia   . Palpitations 02/19/2013   On-set of symptoms March '14. EKG with NSR    Past Surgical History:  Procedure Laterality Date  . benign moles removed    . CATARACT EXTRACTION W/ INTRAOCULAR LENS IMPLANT Bilateral    right - Nov '11, left - Nov '13 [place]  . HEMORRHOID SURGERY    . MANDIBLE SURGERY    . sebaceous cyst excised     Allergies  Allergen Reactions  . Cephalexin     REACTION: diaphoretic and drop in Blood pressure  . Lorazepam Other (See Comments)    amnesia  Current Meds  Medication Sig  . Acetaminophen (TYLENOL PO) Take by mouth as needed.  [name] CALCIUM CITRATE-VITAMIN D PO Take by mouth.  . dorzolamide-timolol (COSOPT) 22.3-6.8 MG/ML ophthalmic solution dorzolamide 22.3 mg-timolol 6.8 mg/mL eye drops  . EPINEPHrine (EPIPEN) 0.3 mg/0.3 mL SOAJ injection Inject 0.3 mg into the muscle once as needed (for allergic reaction).  [name] estradiol (ESTRACE) 0.5 MG tablet   . fluticasone  (FLONASE) 50 MCG/ACT nasal spray USE 2 SPRAYS INTO BOTH NOSTRILS DAILY.  . hydrochlorothiazide (HYDRODIURIL) 12.5 MG tablet TAKE 1/2 TABLET BY MOUTH EVERY DAY  . Multiple Vitamin (MULTIVITAMIN) capsule Take 1 capsule by mouth daily.  [name] OVER THE COUNTER MEDICATION Take 1 packet by mouth daily. Dissolve one packet in a glass of water each morning. Emergen-C  . pneumococcal 23 valent vaccine (PNU-IMMUNE) 25 MCG/0.5ML injection Pneumovax-23 25 mcg/0.5 mL injection solution  INJECT 0.5 ML INTRAMUSCULARLY AS DIRECTED.  [name] progesterone (PROMETRIUM) 100 MG capsule Take 100 mg by mouth at bedtime.   . rivastigmine (EXELON) 3 MG capsule Take 1 capsule (3 mg total) by mouth 2 (two) times daily.  . sertraline (ZOLOFT) 25 MG tablet TAKE 1/2 TABLET BY MOUTH EVERY DAY FOR 7 DAYS THEN INCREASE TO 1 TABLET DAILY (Patient taking differently: Take 25 mg by mouth daily. TAKE 1/2 TABLET BY MOUTH EVERY DAY FOR 7 DAYS THEN INCREASE TO 1 TABLET DAILY)  . [DISCONTINUED] estradiol (ESTRACE) 1 MG tablet Take 0.5 mg by mouth at bedtime.    Social History   Tobacco Use  . Smoking status: Former Smoker    Packs/day: 0.10    Years: 1.00    Pack years: 0.10    Types: Cigarettes    Quit date: 12/12/1962    Years since quitting: 56.5  . Smokeless tobacco: Never Used  . Tobacco comment: smoked for 3 months in college   Substance Use Topics  . Alcohol use: Not Currently    Comment: rarely   Family History  Problem Relation Age of Onset  . Dementia Mother   . Depression Mother 25  . Heart disease Father   . Cancer Sister        breast  . Heart disease Other   . Heart disease Other   . Heart disease Other   . Cancer Sister        breast  . Hypothyroidism Sister   . Asthma Sister   . Rheum arthritis Paternal Grandmother   . Rheum arthritis Daughter      Review of Systems  Constitutional: Negative for chills, fatigue and fever.  Respiratory: Negative for cough, chest tightness, shortness of breath and  wheezing.   Cardiovascular: Negative for chest pain, palpitations and leg swelling.  Psychiatric/Behavioral: Positive for decreased concentration. The patient is nervous/anxious.     Objective:  There were no vitals taken for this visit.      BP Readings from Last 3 Encounters:  01/22/19 106/64  01/22/19 106/64  09/24/18 120/60   Wt Readings from Last 3 Encounters:  01/22/19 130 lb (59 kg)  01/22/19 130 lb (59 kg)  09/24/18 136 lb 6.4 oz (61.9 kg)    EXAM:  GENERAL: alert, oriented, appears well and in no acute distress  HEENT: atraumatic, conjunctiva clear, no obvious abnormalities on inspection of external nose and ears  NECK: normal movements of the head and neck  LUNGS: on inspection no signs of respiratory distress, breathing rate appears normal, no obvious gross SOB, gasping or wheezing  CV: no obvious cyanosis  MS: moves all visible extremities without noticeable abnormality  PSYCH/NEURO: pleasant and cooperative, no obvious depression or anxiety, speech and thought processing grossly intact  SKIN: no noted abnormalities  Assessment/Plan 1. Essential hypertension Pressures they have checked today have been elevated at home.  Encouraged him to check at home and report back to [place] in 1 to 2 weeks so we can determine what to do with medication.  They would like to get off of as many medications as possible.  She is on very low-dose of hydrochlorothiazide and blood pressures have previously been on the lower end, so if this continues to be the case we may be able to discontinue and just monitor.  Blood work to be ordered pending blood pressure results response.  2. Hot flashes - Managed by Dr. Philis Pique - gets yearly female gyn exams She does still follow with gynecology.  She gets hormone replacement through them.  3. Hyperlipidemia, unspecified hyperlipidemia type Diet controlled.  4. Generalized anxiety disorder Has had improvement in anxiety since restarting  Zoloft.  She is on currently 25 mg dose.  She is following with psychiatry and has appointment in the upcoming 2 weeks.  We will continue to monitor.  5. Mild episode of recurrent major depressive disorder ([place]) See above.  This is improved as well on Zoloft.  6. Mild cognitive impairment with memory loss Following with neurology.  She seems to be tolerating the rivastigmine better than previous medications.  She is frustrated with not being "allowed" to drive, but is willing to see how the next few months ago, and get stable on medications before readdressing.   Return pending blood pressure update via mychart in 1-2 weeks.   I discussed the assessment and treatment plan with the patient. The patient was provided an opportunity to ask questions and all were answered. The patient agreed with the plan and demonstrated an understanding of the instructions.   The patient was advised to call back or seek an in-person evaluation if the symptoms worsen or if the condition fails to improve as anticipated.  I provided 25 minutes of non-face-to-face time during this encounter.   Micheline Rough, MD

## 2019-06-19 NOTE — Telephone Encounter (Signed)
I called the pt and informed her of the message below.  Patient declined any symptoms below and scheduled a lab appt for 7/10.

## 2019-06-19 NOTE — Telephone Encounter (Signed)
kegels are always helpful when it comes to incontinence, but please set up UA/culture for her so we can make sure that there is no underlying infection. Then we can go from there with next step. Also please find out more details - leaking with stress (cough/sneeze) or when can't get to bathroom in time? Does she feel she is emptying completely? Any night time leaking?

## 2019-06-21 ENCOUNTER — Encounter: Payer: Self-pay | Admitting: Psychiatry

## 2019-06-21 ENCOUNTER — Ambulatory Visit (INDEPENDENT_AMBULATORY_CARE_PROVIDER_SITE_OTHER): Payer: Medicare Other | Admitting: Psychiatry

## 2019-06-21 ENCOUNTER — Other Ambulatory Visit (INDEPENDENT_AMBULATORY_CARE_PROVIDER_SITE_OTHER): Payer: Medicare Other

## 2019-06-21 ENCOUNTER — Other Ambulatory Visit: Payer: Self-pay

## 2019-06-21 DIAGNOSIS — F330 Major depressive disorder, recurrent, mild: Secondary | ICD-10-CM | POA: Diagnosis not present

## 2019-06-21 DIAGNOSIS — F411 Generalized anxiety disorder: Secondary | ICD-10-CM | POA: Diagnosis not present

## 2019-06-21 DIAGNOSIS — N329 Bladder disorder, unspecified: Secondary | ICD-10-CM | POA: Diagnosis not present

## 2019-06-21 LAB — POC URINALSYSI DIPSTICK (AUTOMATED)
Bilirubin, UA: NEGATIVE
Blood, UA: NEGATIVE
Glucose, UA: NEGATIVE
Ketones, UA: NEGATIVE
Leukocytes, UA: NEGATIVE
Nitrite, UA: NEGATIVE
Protein, UA: NEGATIVE
Spec Grav, UA: 1.015 (ref 1.010–1.025)
Urobilinogen, UA: 0.2 E.U./dL
pH, UA: 7 (ref 5.0–8.0)

## 2019-06-21 MED ORDER — SERTRALINE HCL 25 MG PO TABS: ORAL_TABLET | ORAL | 1 refills | Status: DC

## 2019-06-21 NOTE — Progress Notes (Signed)
[name] [id number] 1936/02/14 83 y.o.  Subjective:   Patient ID:  [name] is a 83 y.o. (DOB 25-Oct-1936) female.  Chief Complaint:  Chief Complaint  Patient presents with  . Anxiety  . Depression    HPI [name] presents to the office today for follow-up of depression and anxiety. She is accompanied by 2 of her daughters, [name] and Colletta [place]. Daughter reports that pt started Sertraline about a week after Sertraline was sent to the pharmacy, possibly around 06/30/19. Has taken 25 mg po qd since 06/05/19.   Pt reports that she has been experiencing some anxiety in the morning that she describes as "shakiness" and feeling "nervous." She reports that she has been having better mornings now compared to prior to starting medication. Daughter reports that anxiety is better controlled in the afternoon. Pt agrees that  Her anxiety is well controlled in the afternoon and evening. Daughter reports that several weeks ago pt was repeatedly calling them and demanding that they allow her to drive and threatening to call the police. Daughter reports that pt has not been calling them repeatedly and demanding to allow her to drive since starting Sertraline. Daughter reports that pt has been fixated on wanting ability to drive. She reports decreased muscle tension.   Pt reports that her mood improves in the afternoon. Pt reports that she continues to have difficulty adjusting to not driving. She reports adequate sleep. She reports that her appetite has been ok. Reports that she is not interested in cooking and this has been consistent for years. She reports energy and motivation have been ok when there is an activity she is interested in. She reports that she has been able to enjoy reading again and is better able to concentrate. Denies SI.   Reports that she continues to have caregiver come and assist her. Daughter reports that pt repeatedly requested caregiver and now that caregiver is coming, she has  been complaining about caregiver. Daughters report that pt has verbalized some ambivalence about placement in LTC facility. Daughters report that they would like to wait until pandemic improves before placing pt.    Review of Systems:  Review of Systems  Genitourinary:       Recent incontinence  Musculoskeletal: Negative for gait problem.  Neurological: Positive for tremors.       They report that pt will shake more with anxiety.   Psychiatric/Behavioral:       Please refer to HPI    Medications: I have reviewed the patient's current medications.  Current Outpatient Medications  Medication Sig Dispense Refill  . Acetaminophen (TYLENOL PO) Take by mouth as needed.    [name] CALCIUM CITRATE-VITAMIN D PO Take by mouth.    . dorzolamide-timolol (COSOPT) 22.3-6.8 MG/ML ophthalmic solution dorzolamide 22.3 mg-timolol 6.8 mg/mL eye drops    . estradiol (ESTRACE) 0.5 MG tablet     . fluticasone (FLONASE) 50 MCG/ACT nasal spray USE 2 SPRAYS INTO BOTH NOSTRILS DAILY. 16 g 3  . hydrochlorothiazide (HYDRODIURIL) 12.5 MG tablet TAKE 1/2 TABLET BY MOUTH EVERY DAY 45 tablet 1  . Multiple Vitamin (MULTIVITAMIN) capsule Take 1 capsule by mouth daily.    . progesterone (PROMETRIUM) 100 MG capsule Take 100 mg by mouth at bedtime.     . rivastigmine (EXELON) 3 MG capsule Take 1 capsule (3 mg total) by mouth 2 (two) times daily. 60 capsule 11  . sertraline (ZOLOFT) 25 MG tablet TAKE 1.5 TABLETS BY MOUTH EVERY DAY FOR 7 DAYS,  THEN INCREASE TO 2 TABLETS DAILY 60 tablet 1  . EPINEPHrine (EPIPEN) 0.3 mg/0.3 mL SOAJ injection Inject 0.3 mg into the muscle once as needed (for allergic reaction).    [name] OVER THE COUNTER MEDICATION Take 1 packet by mouth daily. Dissolve one packet in a glass of water each morning. Emergen-C    . pneumococcal 23 valent vaccine (PNU-IMMUNE) 25 MCG/0.5ML injection Pneumovax-23 25 mcg/0.5 mL injection solution  INJECT 0.5 ML INTRAMUSCULARLY AS DIRECTED.     No current  facility-administered medications for this visit.     Medication Side Effects: None  Allergies:  Allergies  Allergen Reactions  . Cephalexin     REACTION: diaphoretic and drop in Blood pressure  . Lorazepam Other (See Comments)    amnesia    Past Medical History:  Diagnosis Date  . Allergic rhinitis, cause unspecified   . Contact dermatitis 03/16/2013  . Generalized anxiety disorder 02/19/2013   has seen [name], [name] and now [name] for counseling  . Hemorrhoids, external   . Hot flashes   . Hypertension   . Irritable bowel syndrome   . MDD (major depressive disorder) 03/18/2016  . Other and unspecified hyperlipidemia   . Palpitations 02/19/2013   On-set of symptoms March '14. EKG with NSR     Family History  Problem Relation Age of Onset  . Dementia Mother   . Depression Mother 2  . Heart disease Father   . Cancer Sister        breast  . Heart disease Other   . Heart disease Other   . Heart disease Other   . Cancer Sister        breast  . Hypothyroidism Sister   . Asthma Sister   . Rheum arthritis Paternal Grandmother   . Rheum arthritis Daughter     Social History   Socioeconomic History  . Marital status: Married    Spouse name: Not on file  . Number of children: 4  . Years of education: Not on file  . Highest education level: Not on file  Occupational History  . Occupation: Freight forwarder at [employer]    Comment: Retired  Scientific laboratory technician  . Financial resource strain: Not hard at all  . Food insecurity    Worry: Never true    Inability: Never true  . Transportation needs    Medical: No    Non-medical: No  Tobacco Use  . Smoking status: Former Smoker    Packs/day: 0.10    Years: 1.00    Pack years: 0.10    Types: Cigarettes    Quit date: 12/12/1962    Years since quitting: 56.5  . Smokeless tobacco: Never Used  . Tobacco comment: smoked for 3 months in college   Substance and Sexual Activity  . Alcohol use: Not Currently     Comment: rarely  . Drug use: No  . Sexual activity: Not Currently    Partners: Male  Lifestyle  . Physical activity    Days per week: 2 days    Minutes per session: 30 min  . Stress: To some extent  Relationships  . Social connections    Talks on phone: More than three times a week    Gets together: Twice a week    Attends religious service: Not on file    Active member of club or organization: Yes    Attends meetings of clubs or organizations: More than 4 times per year    Relationship status:  Widowed  . Intimate partner violence    Fear of current or ex partner: Not on file    Emotionally abused: Not on file    Physically abused: Not on file    Forced sexual activity: Not on file  Other Topics Concern  . Not on file  Social History Narrative   College grad. Married '61. 4 daughters, 2 grandchildren. 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   Advanced Directives: Has Living Will, HCPOA: Sanjana Folz [phone]         Pt lives in 2 story home - her husband currently resides in nursing facility      01/22/19: Pt. Lives in [place] home, daughters check in on patient, managing medications   Pt. Still drives, manages yardwork she says   Enjoys swim aerobics at the [facility]    Past Medical History, Surgical history, Social history, and Family history were reviewed and updated as appropriate.   Please see review of systems for further details on the patient's review from today.   Objective:   Physical Exam:  There were no vitals taken for this visit.  Physical Exam Constitutional:      General: She is not in acute distress.    Appearance: She is well-developed.  Musculoskeletal:        General: No deformity.  Neurological:     Mental Status: She is alert. Mental status is at baseline.     Coordination: Coordination normal.  Psychiatric:        Attention and Perception: Perception normal. She is inattentive. She does not perceive auditory or visual  hallucinations.        Mood and Affect: Mood is anxious and depressed. Affect is not labile, blunt, angry or inappropriate.        Speech: Speech normal.        Thought Content: Thought content does not include homicidal or suicidal ideation. Thought content does not include homicidal or suicidal plan.        Cognition and Memory: Cognition is impaired. Memory is impaired. She exhibits impaired recent memory.        Judgment: Judgment is inappropriate.     Comments: Insight impaired. Perseverates on being allowed to drive and interactions with daughter. Pt verbally agitated at times.  No delusions.      Lab Review:     Component Value Date/Time   NA 135 01/22/2019 1328   K 4.0 01/22/2019 1328   CL 98 01/22/2019 1328   CO2 29 01/22/2019 1328   GLUCOSE 81 01/22/2019 1328   BUN 14 01/22/2019 1328   CREATININE 0.81 01/22/2019 1328   CALCIUM 9.3 01/22/2019 1328   PROT 7.0 05/24/2011 1220   PROT 7.0 05/24/2011 1220   ALBUMIN 4.3 05/24/2011 1220   ALBUMIN 4.3 05/24/2011 1220   AST 18 05/24/2011 1220   AST 18 05/24/2011 1220   ALT 14 05/24/2011 1220   ALT 14 05/24/2011 1220   ALKPHOS 55 05/24/2011 1220   ALKPHOS 55 05/24/2011 1220   BILITOT 1.5 (H) 05/24/2011 1220   BILITOT 1.5 (H) 05/24/2011 1220   GFRNONAA 83 (L) 03/22/2014 1619   GFRAA >90 03/22/2014 1619       Component Value Date/Time   WBC 5.5 01/22/2019 1328   RBC 4.47 01/22/2019 1328   HGB 14.1 01/22/2019 1328   HCT 41.6 01/22/2019 1328   PLT 259.0 01/22/2019 1328   MCV 93.1 01/22/2019 1328   MCH 30.9 03/22/2014 1619   MCHC 33.8 01/22/2019 1328  RDW 13.1 01/22/2019 1328   LYMPHSABS 1.6 08/31/2011 1447   MONOABS 0.4 08/31/2011 1447   EOSABS 0.0 08/31/2011 1447   BASOSABS 0.0 08/31/2011 1447    No results found for: POCLITH, LITHIUM   No results found for: PHENYTOIN, PHENOBARB, VALPROATE, CBMZ   .res Assessment: Plan:   Will increase Sertraline to improve mood and anxiety since pt has had a partial  response at lower dose. Will titrate slowly since this has improved tolerability in the past. Pt to f/u in 5-6 weeks or sooner if clinically indicated.  Patient advised to contact office with any questions, adverse effects, or acute worsening in signs and symptoms.  Ahtziri was seen today for anxiety and depression.  Diagnoses and all orders for this visit:  Generalized anxiety disorder -     sertraline (ZOLOFT) 25 MG tablet; TAKE 1.5 TABLETS BY MOUTH EVERY DAY FOR 7 DAYS, THEN INCREASE TO 2 TABLETS DAILY  Mild episode of recurrent major depressive disorder ([facility]) -     sertraline (ZOLOFT) 25 MG tablet; TAKE 1.5 TABLETS BY MOUTH EVERY DAY FOR 7 DAYS, THEN INCREASE TO 2 TABLETS DAILY     Please see After Visit Summary for patient specific instructions.  Future Appointments  Date Time Provider [place]  06/24/2019  4:00 PM Kirtland Bouchard, PhD [facility] None  07/09/2019 12:30 PM Kirtland Bouchard, PhD [facility] None  07/25/2019 11:30 AM Thayer Headings, PMHNP [facility] None  08/14/2019  2:00 PM Cameron Sprang, MD [facility] None    No orders of the defined types were placed in this encounter.   -------------------------------

## 2019-06-22 LAB — URINE CULTURE
MICRO NUMBER:: 654931
SPECIMEN QUALITY:: ADEQUATE

## 2019-06-24 ENCOUNTER — Ambulatory Visit: Payer: Medicare Other | Admitting: Psychology

## 2019-07-02 ENCOUNTER — Encounter: Payer: Self-pay | Admitting: Family Medicine

## 2019-07-03 ENCOUNTER — Telehealth: Payer: Self-pay | Admitting: Family Medicine

## 2019-07-03 NOTE — Telephone Encounter (Signed)
°     I called the patient Daughter to inform that it is no female provider, but they have two PA I suggested that she should call the office to see  If her mother could see a PA the Daughter is ok with that and will call   Copied from [place] [id number]. Topic: General - Call Back - No Documentation >> Jul 02, 2019 11:17 AM Erick Blinks wrote: Reason for CRM: Pt's daughter [name] called requesting call back from a nurse. Regarding recent referral to [facility] states that she would prefer a female urologist if possible. She is not comfortable with a female physician.  Best contact: [phone]

## 2019-07-08 DIAGNOSIS — N3281 Overactive bladder: Secondary | ICD-10-CM | POA: Diagnosis not present

## 2019-07-09 ENCOUNTER — Ambulatory Visit (INDEPENDENT_AMBULATORY_CARE_PROVIDER_SITE_OTHER): Payer: Medicare Other | Admitting: Psychology

## 2019-07-09 DIAGNOSIS — F411 Generalized anxiety disorder: Secondary | ICD-10-CM

## 2019-07-11 ENCOUNTER — Encounter: Payer: Self-pay | Admitting: Internal Medicine

## 2019-07-12 ENCOUNTER — Encounter: Payer: Self-pay | Admitting: Family Medicine

## 2019-07-12 ENCOUNTER — Other Ambulatory Visit: Payer: Self-pay | Admitting: Psychiatry

## 2019-07-12 DIAGNOSIS — F411 Generalized anxiety disorder: Secondary | ICD-10-CM

## 2019-07-12 DIAGNOSIS — F330 Major depressive disorder, recurrent, mild: Secondary | ICD-10-CM

## 2019-07-22 DIAGNOSIS — H401233 Low-tension glaucoma, bilateral, severe stage: Secondary | ICD-10-CM | POA: Diagnosis not present

## 2019-07-25 ENCOUNTER — Ambulatory Visit: Payer: Medicare Other | Admitting: Psychiatry

## 2019-07-29 ENCOUNTER — Other Ambulatory Visit: Payer: Self-pay | Admitting: Family Medicine

## 2019-07-29 DIAGNOSIS — I10 Essential (primary) hypertension: Secondary | ICD-10-CM

## 2019-07-29 NOTE — Progress Notes (Signed)
DEXA was completed on the same day

## 2019-08-05 ENCOUNTER — Ambulatory Visit (INDEPENDENT_AMBULATORY_CARE_PROVIDER_SITE_OTHER): Payer: Medicare Other | Admitting: Psychology

## 2019-08-05 DIAGNOSIS — F411 Generalized anxiety disorder: Secondary | ICD-10-CM | POA: Diagnosis not present

## 2019-08-06 ENCOUNTER — Encounter: Payer: Self-pay | Admitting: Psychiatry

## 2019-08-06 ENCOUNTER — Other Ambulatory Visit: Payer: Self-pay

## 2019-08-06 ENCOUNTER — Ambulatory Visit (INDEPENDENT_AMBULATORY_CARE_PROVIDER_SITE_OTHER): Payer: Medicare Other | Admitting: Psychiatry

## 2019-08-06 DIAGNOSIS — F411 Generalized anxiety disorder: Secondary | ICD-10-CM

## 2019-08-06 DIAGNOSIS — F330 Major depressive disorder, recurrent, mild: Secondary | ICD-10-CM

## 2019-08-06 MED ORDER — SERTRALINE HCL 50 MG PO TABS: 50.0000 mg | ORAL_TABLET | Freq: Every day | ORAL | 0 refills | Status: DC

## 2019-08-06 NOTE — Progress Notes (Signed)
[name] [id number] 26-Aug-1936 83 y.o.  Subjective:   Patient ID:  [name] is a 83 y.o. (DOB May 10, 1936) female.  Chief Complaint:  Chief Complaint  Patient presents with  . Follow-up    Anxiety, Depression    HPI [name] presents to the office today for follow-up of anxiety and depression. She is accompanied by her daughter, [name]. Daughter reports that "we would all agree that things have been better."  "I'm doing better." She reports that she is not calling her daughters as often due to anxiety. She reports that anxiety in the mornings has improved. No longer feeling shaky upon awakening. Pt reports that she is trying to do more to keep herself occupied. Pt reports that she has experienced some depression over the last 2 weekends on Saturday night. Daughter reports that pt did not have a caregiver last weekend. She reports "most of the time, it's (mood) is pretty good. Reports that she rarely has "that heavy feeling" anymore with depression. She reports sleep is adequate overall. She reports that she typically stays asleep after falling asleep. Daughter reports that her energy and motivation have improved and pt is in agreement with this. She reorts that her concentration is adequate once she gets started on tasks. She reports one of her daughters continues to help with finances. Appetite has been good. Denies SI.  Daughter reports that pt has the keys to her vehicle again after she demonstrated to them that she was able to drive ok when they accompanied her. Has sitter coming some days. Pt plans to start water aerobics.    Review of Systems:  Review of Systems  Gastrointestinal: Negative for nausea.  Musculoskeletal: Negative for gait problem.  Neurological: Positive for tremors.  Psychiatric/Behavioral:       Please refer to HPI    Medications: I have reviewed the patient's current medications.  Current Outpatient Medications  Medication Sig Dispense Refill  .  Acetaminophen (TYLENOL PO) Take by mouth as needed.    [name] CALCIUM CITRATE-VITAMIN D PO Take by mouth.    . dorzolamide-timolol (COSOPT) 22.3-6.8 MG/ML ophthalmic solution dorzolamide 22.3 mg-timolol 6.8 mg/mL eye drops    . EPINEPHrine (EPIPEN) 0.3 mg/0.3 mL SOAJ injection Inject 0.3 mg into the muscle once as needed (for allergic reaction).    [name] estradiol (ESTRACE) 0.5 MG tablet     . fluticasone (FLONASE) 50 MCG/ACT nasal spray USE 2 SPRAYS INTO BOTH NOSTRILS DAILY. 16 g 3  . hydrochlorothiazide (HYDRODIURIL) 12.5 MG tablet TAKE 1/2 TABLET BY MOUTH EVERY DAY 45 tablet 1  . Multiple Vitamin (MULTIVITAMIN) capsule Take 1 capsule by mouth daily.    [name] OVER THE COUNTER MEDICATION Take 1 packet by mouth daily. Dissolve one packet in a glass of water each morning. Emergen-C    . pneumococcal 23 valent vaccine (PNU-IMMUNE) 25 MCG/0.5ML injection Pneumovax-23 25 mcg/0.5 mL injection solution  INJECT 0.5 ML INTRAMUSCULARLY AS DIRECTED.    [name] progesterone (PROMETRIUM) 100 MG capsule Take 100 mg by mouth at bedtime.     . rivastigmine (EXELON) 3 MG capsule Take 1 capsule (3 mg total) by mouth 2 (two) times daily. 60 capsule 11  . sertraline (ZOLOFT) 50 MG tablet Take 1 tablet (50 mg total) by mouth daily. 90 tablet 0   No current facility-administered medications for this visit.     Medication Side Effects: None  Allergies:  Allergies  Allergen Reactions  . Cephalexin     REACTION: diaphoretic and drop in  Blood pressure  . Lorazepam Other (See Comments)    amnesia    Past Medical History:  Diagnosis Date  . Allergic rhinitis, cause unspecified   . Contact dermatitis 03/16/2013  . Generalized anxiety disorder 02/19/2013   has seen [name], [name] and now [name] for counseling  . Hemorrhoids, external   . Hot flashes   . Hypertension   . Irritable bowel syndrome   . MDD (major depressive disorder) 03/18/2016  . Other and unspecified hyperlipidemia   . Palpitations 02/19/2013    On-set of symptoms March '14. EKG with NSR     Family History  Problem Relation Age of Onset  . Dementia Mother   . Depression Mother 86  . Heart disease Father   . Cancer Sister        breast  . Heart disease Other   . Heart disease Other   . Heart disease Other   . Cancer Sister        breast  . Hypothyroidism Sister   . Asthma Sister   . Rheum arthritis Paternal Grandmother   . Rheum arthritis Daughter     Social History   Socioeconomic History  . Marital status: Married    Spouse name: Not on file  . Number of children: 4  . Years of education: Not on file  . Highest education level: Not on file  Occupational History  . Occupation: Freight forwarder at [employer]    Comment: Retired  Scientific laboratory technician  . Financial resource strain: Not hard at all  . Food insecurity    Worry: Never true    Inability: Never true  . Transportation needs    Medical: No    Non-medical: No  Tobacco Use  . Smoking status: Former Smoker    Packs/day: 0.10    Years: 1.00    Pack years: 0.10    Types: Cigarettes    Quit date: 12/12/1962    Years since quitting: 56.6  . Smokeless tobacco: Never Used  . Tobacco comment: smoked for 3 months in college   Substance and Sexual Activity  . Alcohol use: Not Currently    Comment: rarely  . Drug use: No  . Sexual activity: Not Currently    Partners: Male  Lifestyle  . Physical activity    Days per week: 2 days    Minutes per session: 30 min  . Stress: To some extent  Relationships  . Social connections    Talks on phone: More than three times a week    Gets together: Twice a week    Attends religious service: Not on file    Active member of club or organization: Yes    Attends meetings of clubs or organizations: More than 4 times per year    Relationship status: Widowed  . Intimate partner violence    Fear of current or ex partner: Not on file    Emotionally abused: Not on file    Physically abused: Not on file    Forced sexual  activity: Not on file  Other Topics Concern  . Not on file  Social History Narrative   College grad. Married '61. 4 daughters, 2 grandchildren. 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   Advanced Directives: Has Living Will, HCPOA: Seriah Degidio [phone]         Pt lives in 2 story home - her husband currently resides in nursing facility      01/22/19: Pt. Lives in [place] home,  daughters check in on patient, managing medications   Pt. Still drives, manages yardwork she says   Enjoys swim aerobics at the [facility]    Past Medical History, Surgical history, Social history, and Family history were reviewed and updated as appropriate.   Please see review of systems for further details on the patient's review from today.   Objective:   Physical Exam:  Wt 118 lb (53.5 kg)   BMI 21.24 kg/m   Physical Exam Constitutional:      General: She is not in acute distress.    Appearance: She is well-developed.  Musculoskeletal:        General: No deformity.  Neurological:     Mental Status: She is alert and oriented to person, place, and time.     Coordination: Coordination normal.  Psychiatric:        Attention and Perception: Attention and perception normal. She does not perceive auditory or visual hallucinations.        Mood and Affect: Mood is not depressed. Affect is not labile, blunt, angry or inappropriate.        Speech: Speech normal.        Behavior: Behavior normal.        Thought Content: Thought content normal. Thought content does not include homicidal or suicidal ideation. Thought content does not include homicidal or suicidal plan.        Cognition and Memory: Cognition normal. She exhibits impaired recent memory.        Judgment: Judgment normal.     Comments: Mood presents as less anxious compared to recent exams Insight intact. No delusions.      Lab Review:     Component Value Date/Time   NA 135 01/22/2019 1328   K 4.0 01/22/2019 1328   CL 98 01/22/2019  1328   CO2 29 01/22/2019 1328   GLUCOSE 81 01/22/2019 1328   BUN 14 01/22/2019 1328   CREATININE 0.81 01/22/2019 1328   CALCIUM 9.3 01/22/2019 1328   PROT 7.0 05/24/2011 1220   PROT 7.0 05/24/2011 1220   ALBUMIN 4.3 05/24/2011 1220   ALBUMIN 4.3 05/24/2011 1220   AST 18 05/24/2011 1220   AST 18 05/24/2011 1220   ALT 14 05/24/2011 1220   ALT 14 05/24/2011 1220   ALKPHOS 55 05/24/2011 1220   ALKPHOS 55 05/24/2011 1220   BILITOT 1.5 (H) 05/24/2011 1220   BILITOT 1.5 (H) 05/24/2011 1220   GFRNONAA 83 (L) 03/22/2014 1619   GFRAA >90 03/22/2014 1619       Component Value Date/Time   WBC 5.5 01/22/2019 1328   RBC 4.47 01/22/2019 1328   HGB 14.1 01/22/2019 1328   HCT 41.6 01/22/2019 1328   PLT 259.0 01/22/2019 1328   MCV 93.1 01/22/2019 1328   MCH 30.9 03/22/2014 1619   MCHC 33.8 01/22/2019 1328   RDW 13.1 01/22/2019 1328   LYMPHSABS 1.6 08/31/2011 1447   MONOABS 0.4 08/31/2011 1447   EOSABS 0.0 08/31/2011 1447   BASOSABS 0.0 08/31/2011 1447    No results found for: POCLITH, LITHIUM   No results found for: PHENYTOIN, PHENOBARB, VALPROATE, CBMZ   .res Assessment: Plan:   Patient seen for 30 minutes and greater than 50% of visit spent counseling patient and her daughter regarding goals for treatment and discussing how to determine if mood and anxiety signs and symptoms are adequately controlled.  Discussed that signs that patient may need an increase in dose would include no longer being able to manage or redirect  anxiety and depressive signs and symptoms or if signs and symptoms are significantly interfering with function or quality of life.  Discussed strategies to continue to improve mood and manage anxiety. Will continue sertraline 50 mg daily for anxiety and depression. Patient to follow-up in 2 months or sooner if clinically indicated. Patient advised to contact office with any questions, adverse effects, or acute worsening in signs and symptoms.   Zayra was seen today  for follow-up.  Diagnoses and all orders for this visit:  Generalized anxiety disorder -     sertraline (ZOLOFT) 50 MG tablet; Take 1 tablet (50 mg total) by mouth daily.  Mild episode of recurrent major depressive disorder (HCC) -     sertraline (ZOLOFT) 50 MG tablet; Take 1 tablet (50 mg total) by mouth daily.     Please see After Visit Summary for patient specific instructions.  Future Appointments  Date Time Provider [place]  08/14/2019  2:00 PM Cameron Sprang, MD [facility] None  09/02/2019  2:00 PM Kirtland Bouchard, PhD [facility] None  09/16/2019 12:00 PM [name], MD [facility]  10/08/2019  1:45 PM Thayer Headings, PMHNP [facility] None    No orders of the defined types were placed in this encounter.   -------------------------------

## 2019-08-14 ENCOUNTER — Encounter: Payer: Self-pay | Admitting: Neurology

## 2019-08-14 ENCOUNTER — Other Ambulatory Visit: Payer: Self-pay

## 2019-08-14 ENCOUNTER — Telehealth (INDEPENDENT_AMBULATORY_CARE_PROVIDER_SITE_OTHER): Payer: Medicare Other | Admitting: Neurology

## 2019-08-14 VITALS — Ht 63.0 in | Wt 118.0 lb

## 2019-08-14 DIAGNOSIS — G301 Alzheimer's disease with late onset: Secondary | ICD-10-CM | POA: Diagnosis not present

## 2019-08-14 DIAGNOSIS — F02818 Dementia in other diseases classified elsewhere, unspecified severity, with other behavioral disturbance: Secondary | ICD-10-CM

## 2019-08-14 DIAGNOSIS — F0281 Dementia in other diseases classified elsewhere with behavioral disturbance: Secondary | ICD-10-CM

## 2019-08-14 NOTE — Progress Notes (Signed)
Virtual Visit via Video Note The purpose of this virtual visit is to provide medical care while limiting exposure to the novel coronavirus.    Consent was obtained for video visit:  Yes.   Answered questions that patient had about telehealth interaction:  Yes.   I discussed the limitations, risks, security and privacy concerns of performing an evaluation and management service by telemedicine. I also discussed with the patient that there may be a patient responsible charge related to this service. The patient expressed understanding and agreed to proceed.  Pt location: Home Physician Location: office Name of referring provider:  [name], DO I connected with Bosie Clos Kimbell at patients initiation/request on 08/14/2019 at  2:00 PM EDT by video enabled telemedicine application and verified that I am speaking with the correct person using two identifiers. Pt MRN:  [id number] Pt DOB:  1936-07-29 Video Participants:  [name];  [name] (daughter)   History of Present Illness:  The patient was seen as a virtual video visit on 08/14/2019. She was last seen 5 months ago in the neurology clinic for dementia with behavioral disturbance (anxiety). Her daughter [name] is present during the e-visit to provide additional information. MOCA blind done in April 2020 was 13/22. She is on rivastigmine 1.5mg  BID and have not increased to 3mg  dose. She is concerned rivastigmine is causing dizziness, however [name] reminds her she was already complaining of dizziness prior to initiation of medication. She feels more dizzy after she takes her medication. She feels the dizziness is not as severe as early on, it is there in the morning and she starts feeling normal after 11am. Sertraline was also briefly stopped but the dizziness and shakiness did not go away, she is back on it and family feels it is helping her, she still has moments of anxiety but not constant/everyday. She is learning how to work through  it. She has resumed driving, at one point depression and anxiety were such a controlling factor, but with improvement, she is able to drive again. She has an in-home caregiver 3 days a week. She has difficulties learning her new iPhone, causing her anxiety. She gets 7-8 hours of sleep and feels tired in the morning. Family concerned about tremors, she still has tremors in her left hand and arm, and they notice she keeps it close to her side when walking, not swaying naturally when she walks. Tremors started 3 years ago. They have also noticed some urinary incontinence since starting rivastigmine. There has been some concern about weight loss, she states she eats 3 meals a day. She is also back to doing water yoga. No paranoia or hallucinations. No falls.   HPI 12/18/2017: This is a pleasant 83 yo RH woman with a history of hypertension, anxiety, depression, who presented for evaluation of memory loss. She feels her memory is "a little shaky." She endorses a lot of anxiety, and states it is causing her not to recall things and she does not understand it. Family started noticing memory changes around 2 years ago. She has been the main caregiver for her husband with dementia, who had been in and out of the hospital several times. She was "not dealing with things as well as I should." For instance, 2 years ago she was taking care of the managing their family home, but they noticed she was not keeping up with it, and turned it over to her sister. Her daughter took over finances in the Spring 2018 because she  was afraid she would forget and she had never done the taxes in the past. She lives alone and denies missing medications. She denies getting lost driving. She has 3 cats at home that are cared for, she does not forget to feed them. She has no difficulties running the household, doing laundry and yardwork. She has learned how to use the leafblower. Their main concern is there anxiety and depression have "really ramped  up." She is so stressed and scattered, that she could not stay on task. Family started noticing anxiety around 4-5 years ago, but in January 2017 she was "in breakdown territory" and started Lexapro and counseling. It appears she was on a very low dose due to concern for side effects. She would wake up every morning between 3-4 AM with a panic attack, unable to reason with herself, shaky. Family reports that she would forget what time they told her she would be picked up, even if it was in a text message in front of her. They have noticed lack of focus and concentration. She would ask the same question about this doctor's appointment and was quite anxious about today's visit. Family reports she "tends to catastrophize things." No paranoia or hallucinations. She was switched from Lexapro to Prozac at the family's request, and "something about Prozac bothers me all in my head."   She denies any headaches, vertigo, diplopia, dysarthria/dysphagia, neck/back pain, focal numbness/tingling/weakness, bowel/bladder dysfunction. She reports a "mental dizziness," where she states "my brain just feels full." She has tremors, R>L. She had lost her sense of smell after sinus surgery many years ago. Her mother had dementia in her 81s, her older sister has memory issues. No history of significant head injuries, no alcohol use.     Current Outpatient Medications on File Prior to Visit  Medication Sig Dispense Refill   Acetaminophen (TYLENOL PO) Take by mouth as needed.     CALCIUM CITRATE-VITAMIN D PO Take by mouth.     dorzolamide-timolol (COSOPT) 22.3-6.8 MG/ML ophthalmic solution dorzolamide 22.3 mg-timolol 6.8 mg/mL eye drops     EPINEPHrine (EPIPEN) 0.3 mg/0.3 mL SOAJ injection Inject 0.3 mg into the muscle once as needed (for allergic reaction).     estradiol (ESTRACE) 0.5 MG tablet      fluticasone (FLONASE) 50 MCG/ACT nasal spray USE 2 SPRAYS INTO BOTH NOSTRILS DAILY. 16 g 3   Multiple Vitamin  (MULTIVITAMIN) capsule Take 1 capsule by mouth daily.     OVER THE COUNTER MEDICATION Take 1 packet by mouth daily. Dissolve one packet in a glass of water each morning. Emergen-C     progesterone (PROMETRIUM) 100 MG capsule Take 100 mg by mouth at bedtime.      rivastigmine (EXELON) 3 MG capsule Take 1 capsule (3 mg total) by mouth 2 (two) times daily. 60 capsule 11   sertraline (ZOLOFT) 50 MG tablet Take 1 tablet (50 mg total) by mouth daily. 90 tablet 0   No current facility-administered medications on file prior to visit.      Observations/Objective:   Vitals:   08/14/19 1335  Weight: 118 lb (53.5 kg)  Height: 5\' 3"  (1.6 m)   GEN:  The patient appears stated age and is in NAD.  Neurological examination: Patient is awake, alert, oriented to person, place, city/state. No aphasia or dysarthria. Intact fluency and comprehension. Remote and recent memory impaired. Able to name and repeat. Cranial nerves: Extraocular movements intact with no nystagmus. No facial asymmetry. Motor: moves all extremities symmetrically, at least  anti-gravity x 4. No incoordination on finger to nose testing. Gait: narrow-based and steady, able to tandem walk adequately, fair arm swing bilaterally. Negative Romberg test. No tremor noted on video today, no bradykinesia, good finger and foot taps.   Montreal Cognitive Assessment Blind 08/14/2019 03/14/2019  Attention: Read list of digits (0/2) 2 2  Attention: Read list of letters (0/1) 1 1  Attention: Serial 7 subtraction starting at 100 (0/3) 3 3  Language: Repeat phrase (0/2) 2 2  Language : Fluency (0/1) 1 1  Abstraction (0/2) 2 1  Delayed Recall (0/5) 0 0  Orientation (0/6) 4 3  Total 15 -     Assessment and Plan:   This is an 83 yo RH woman with a history of hypertension, anxiety, depression, who presented for evaluation of worsening memory. 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 MOCA blind score today 15/22 (13/22 in April 2020). She feels rivastigmine is causing dizziness however family reminds her she had dizziness even prior to starting medication. She was instructed to hold off on taking rivastigmine for a week and assess if any improvement in dizziness, if none, resume dose for a week then increase to 3mg  BID. Several concerns addressed today, including tremor, would continue to monitor for now, they are concerned about Parkinson's disease. She has had weight loss, discuss with PCP. Continue management of anxiety with Psychiatry. Continue close supervision, monitor driving. Follow-up as scheduled in December, they know to call for any changes.    Follow Up Instructions:   -I discussed the assessment and treatment plan with the patient. The patient was provided an opportunity to ask questions and all were answered. The patient agreed with the plan and demonstrated an understanding of the instructions.   The patient was advised to call back or seek an in-person evaluation if the symptoms worsen or if the condition fails to improve as anticipated.    Cameron Sprang, MD

## 2019-09-02 ENCOUNTER — Ambulatory Visit (INDEPENDENT_AMBULATORY_CARE_PROVIDER_SITE_OTHER): Payer: Medicare Other | Admitting: Psychology

## 2019-09-02 DIAGNOSIS — F411 Generalized anxiety disorder: Secondary | ICD-10-CM | POA: Diagnosis not present

## 2019-09-12 ENCOUNTER — Other Ambulatory Visit: Payer: Self-pay | Admitting: Psychiatry

## 2019-09-12 DIAGNOSIS — F330 Major depressive disorder, recurrent, mild: Secondary | ICD-10-CM

## 2019-09-12 DIAGNOSIS — F411 Generalized anxiety disorder: Secondary | ICD-10-CM

## 2019-09-16 ENCOUNTER — Encounter: Payer: Self-pay | Admitting: Family Medicine

## 2019-09-16 ENCOUNTER — Ambulatory Visit (INDEPENDENT_AMBULATORY_CARE_PROVIDER_SITE_OTHER): Payer: Medicare Other | Admitting: Family Medicine

## 2019-09-16 ENCOUNTER — Other Ambulatory Visit: Payer: Self-pay

## 2019-09-16 VITALS — BP 110/70 | HR 91 | Temp 97.3°F | Ht 63.0 in | Wt 118.3 lb

## 2019-09-16 DIAGNOSIS — F411 Generalized anxiety disorder: Secondary | ICD-10-CM | POA: Diagnosis not present

## 2019-09-16 DIAGNOSIS — E785 Hyperlipidemia, unspecified: Secondary | ICD-10-CM

## 2019-09-16 DIAGNOSIS — J309 Allergic rhinitis, unspecified: Secondary | ICD-10-CM | POA: Diagnosis not present

## 2019-09-16 DIAGNOSIS — I10 Essential (primary) hypertension: Secondary | ICD-10-CM | POA: Diagnosis not present

## 2019-09-16 DIAGNOSIS — F330 Major depressive disorder, recurrent, mild: Secondary | ICD-10-CM

## 2019-09-16 DIAGNOSIS — G3184 Mild cognitive impairment, so stated: Secondary | ICD-10-CM | POA: Diagnosis not present

## 2019-09-16 LAB — COMPREHENSIVE METABOLIC PANEL
ALT: 12 U/L (ref 0–35)
AST: 16 U/L (ref 0–37)
Albumin: 4.4 g/dL (ref 3.5–5.2)
Alkaline Phosphatase: 59 U/L (ref 39–117)
BUN: 21 mg/dL (ref 6–23)
CO2: 28 mEq/L (ref 19–32)
Calcium: 9.8 mg/dL (ref 8.4–10.5)
Chloride: 102 mEq/L (ref 96–112)
Creatinine, Ser: 0.76 mg/dL (ref 0.40–1.20)
GFR: 72.92 mL/min (ref >60.00)
Glucose, Bld: 75 mg/dL (ref 70–99)
Potassium: 4.4 mEq/L (ref 3.5–5.1)
Sodium: 139 mEq/L (ref 135–145)
Total Bilirubin: 1.1 mg/dL (ref 0.2–1.2)
Total Protein: 6.6 g/dL (ref 6.0–8.3)

## 2019-09-16 LAB — CBC WITH DIFFERENTIAL/PLATELET
Basophils Absolute: 0.1 10*3/uL (ref 0.0–0.1)
Basophils Relative: 0.9 % (ref 0.0–3.0)
Eosinophils Absolute: 0 10*3/uL (ref 0.0–0.7)
Eosinophils Relative: 0.8 % (ref 0.0–5.0)
HCT: 40.5 % (ref 36.0–46.0)
Hemoglobin: 13.8 g/dL (ref 12.0–15.0)
Lymphocytes Relative: 25.5 % (ref 12.0–46.0)
Lymphs Abs: 1.4 10*3/uL (ref 0.7–4.0)
MCHC: 34 g/dL (ref 30.0–36.0)
MCV: 92.8 fl (ref 78.0–100.0)
Monocytes Absolute: 0.5 10*3/uL (ref 0.1–1.0)
Monocytes Relative: 8.2 % (ref 3.0–12.0)
Neutro Abs: 3.6 10*3/uL (ref 1.4–7.7)
Neutrophils Relative %: 64.6 % (ref 43.0–77.0)
Platelets: 239 10*3/uL (ref 150.0–400.0)
RBC: 4.37 Mil/uL (ref 3.87–5.11)
RDW: 12.8 % (ref 11.5–15.5)
WBC: 5.6 10*3/uL (ref 4.0–10.5)

## 2019-09-16 LAB — LIPID PANEL
Cholesterol: 223 mg/dL — ABNORMAL HIGH (ref 0–200)
HDL: 68.8 mg/dL (ref >39.00)
LDL Cholesterol: 131 mg/dL — ABNORMAL HIGH (ref 0–99)
NonHDL: 153.86
Total CHOL/HDL Ratio: 3
Triglycerides: 114 mg/dL (ref 0.0–149.0)
VLDL: 22.8 mg/dL (ref 0.0–40.0)

## 2019-09-16 LAB — TSH: TSH: 1.67 u[IU]/mL (ref 0.35–4.50)

## 2019-09-16 NOTE — Progress Notes (Signed)
[name] DOB: July 24, 1936 Encounter date: 09/16/2019  This is a 83 y.o. female who presents with Chief Complaint  Patient presents with  . Follow-up    History of present illness: In July we stopped taking the hctz since bp were running on lower end at home. Currently off medications. Didn't check pressures quite as much recently - but something similar to 130's/higher 70s, 110/69.   Feeling good. Has to get self started in morning, but finding plenty to do. Learning to paying attention to getting self started. Keeping busy around house. Each of her daughters take turns having her for dinner. Doing water yoga 2 days/week.   Memory impairment: has helper from [employer] still - 2 hours 3 days/week. Helpful for anxiety. Rivastigmine starting in April. Following with neurology as well as psychiatry for anxiety/depression. On zoloft. Feels that this is doing ok for her. She does worry that the left arm is still shaking. Daughter here with her and reminding her of what Dr. Delice Lesch said on last visit. No change in shaking before or after medication.   Allergies: states no problem with these. Hasn't used flonase this year.  Sees Dr. Philis Pique for gyn needs.   States that weight at 130 is way too high for her. She is more comfortable at current weight.   Allergies  Allergen Reactions  . Aricept [Donepezil Hcl]     Hallucinations, confusion  . Cephalexin     REACTION: diaphoretic and drop in Blood pressure  . Lorazepam Other (See Comments)    amnesia   Current Meds  Medication Sig  . Acetaminophen (TYLENOL PO) Take by mouth as needed.  [name] CALCIUM CITRATE-VITAMIN D PO Take by mouth.  . dorzolamide-timolol (COSOPT) 22.3-6.8 MG/ML ophthalmic solution dorzolamide 22.3 mg-timolol 6.8 mg/mL eye drops  . EPINEPHrine (EPIPEN) 0.3 mg/0.3 mL SOAJ injection Inject 0.3 mg into the muscle once as needed (for allergic reaction).  [name] estradiol (ESTRACE) 0.5 MG tablet   . Multiple Vitamin  (MULTIVITAMIN) capsule Take 1 capsule by mouth daily.  [name] OVER THE COUNTER MEDICATION Take 1 packet by mouth daily. Dissolve one packet in a glass of water each morning. Emergen-C  . progesterone (PROMETRIUM) 100 MG capsule Take 100 mg by mouth at bedtime.   . rivastigmine (EXELON) 3 MG capsule Take 1 capsule (3 mg total) by mouth 2 (two) times daily.  . sertraline (ZOLOFT) 50 MG tablet Take 1 tablet (50 mg total) by mouth daily.  . [DISCONTINUED] fluticasone (FLONASE) 50 MCG/ACT nasal spray USE 2 SPRAYS INTO BOTH NOSTRILS DAILY.    Review of Systems  Constitutional: Negative for chills, fatigue and fever.  Respiratory: Negative for cough, chest tightness, shortness of breath and wheezing.   Cardiovascular: Negative for chest pain, palpitations and leg swelling.    Objective:  BP 110/70 (BP Location: Left Arm, Patient Position: Sitting, Cuff Size: Normal)   Pulse 91   Temp (!) 97.3 F (36.3 C) (Temporal)   Ht 5\' 3"  (1.6 m)   Wt 118 lb 4.8 oz (53.7 kg)   BMI 20.96 kg/m   Weight: 118 lb 4.8 oz (53.7 kg)   BP Readings from Last 3 Encounters:  09/16/19 110/70  01/22/19 106/64  01/22/19 106/64   Wt Readings from Last 3 Encounters:  09/16/19 118 lb 4.8 oz (53.7 kg)  08/14/19 118 lb (53.5 kg)  01/22/19 130 lb (59 kg)    Physical Exam Constitutional:      General: She is not in acute distress.  Appearance: She is well-developed.  Cardiovascular:     Rate and Rhythm: Normal rate and regular rhythm.     Heart sounds: Normal heart sounds. No murmur. No friction rub.  Pulmonary:     Effort: Pulmonary effort is normal. No respiratory distress.     Breath sounds: Normal breath sounds. No wheezing or rales.  Musculoskeletal:     Right lower leg: No edema.     Left lower leg: No edema.  Neurological:     Mental Status: She is alert and oriented to person, place, and time.     Motor: Tremor present.     Comments: Tremor left hand, forearm  Psychiatric:        Behavior: Behavior  normal.     Assessment/Plan  1. Essential hypertension Well controlled off of medication. Continue weekly blood pressure monitoring at home just to maintain where her baseline is at. - CBC with Differential/Platelet; Future - Comprehensive metabolic panel; Future - Comprehensive metabolic panel - CBC with Differential/Platelet  2. Generalized anxiety disorder Feels that this is actually doing better.  She does well with having somebody in the home a few days a week to be around her.  She spends time with both of her daughters.  Does better when she is staying busy.  Zoloft also helps.  3. Mild episode of recurrent major depressive disorder ([place]) Zoloft helping.  She only has short periods where she feels more down.  Getting up in the morning is 1 of those and then in the afternoon.  But overall feels like she is progressing with mood.  4. Mild cognitive impairment with memory loss Following with neurology for this.  Fairly stable but has been some decline in the last year.  5. Allergic rhinitis, unspecified seasonality, unspecified trigger Over-the-counter treatment as needed.  6. Hyperlipidemia, unspecified hyperlipidemia type Cholesterol last checked was significantly higher than the time before.  We will get a recheck of baseline.  Discussed that if elevated might consider statin treatment in order to help prevent cerebral or cardiovascular events. - Lipid panel; Future - TSH; Future - TSH - Lipid panel    Return for pending bloodwork.    Micheline Rough, MD

## 2019-09-16 NOTE — Patient Instructions (Signed)
OK to check blood pressure once weekly. No need to check more frequently. OK if most of ranges are in the 110-130 ish/65-85. If you regularly see numbers outside of this range let me know.

## 2019-09-30 ENCOUNTER — Ambulatory Visit (INDEPENDENT_AMBULATORY_CARE_PROVIDER_SITE_OTHER): Payer: Medicare Other | Admitting: Psychology

## 2019-09-30 DIAGNOSIS — F411 Generalized anxiety disorder: Secondary | ICD-10-CM

## 2019-10-08 ENCOUNTER — Ambulatory Visit (INDEPENDENT_AMBULATORY_CARE_PROVIDER_SITE_OTHER): Payer: Medicare Other | Admitting: Psychiatry

## 2019-10-08 ENCOUNTER — Other Ambulatory Visit: Payer: Self-pay

## 2019-10-08 ENCOUNTER — Encounter: Payer: Self-pay | Admitting: Psychiatry

## 2019-10-08 DIAGNOSIS — F411 Generalized anxiety disorder: Secondary | ICD-10-CM

## 2019-10-08 DIAGNOSIS — F330 Major depressive disorder, recurrent, mild: Secondary | ICD-10-CM | POA: Diagnosis not present

## 2019-10-08 MED ORDER — SERTRALINE HCL 50 MG PO TABS: 50.0000 mg | ORAL_TABLET | Freq: Every day | ORAL | 1 refills | Status: DC

## 2019-10-08 NOTE — Progress Notes (Signed)
[name] [id number] 10-20-1936 83 y.o.  Virtual Visit via Telephone Note  I connected with pt on 10/08/19 at  1:45 PM EDT by telephone and verified that I am speaking with the correct person using two identifiers.   I discussed the limitations, risks, security and privacy concerns of performing an evaluation and management service by telephone and the availability of in person appointments. I also discussed with the patient that there may be a patient responsible charge related to this service. The patient expressed understanding and agreed to proceed.   I discussed the assessment and treatment plan with the patient. The patient was provided an opportunity to ask questions and all were answered. The patient agreed with the plan and demonstrated an understanding of the instructions.   The patient was advised to call back or seek an in-person evaluation if the symptoms worsen or if the condition fails to improve as anticipated.  I provided 25 minutes of non-face-to-face time during this encounter.  The patient was located at home.  The provider was located at [place].   [name], PMHNP   Subjective:   Patient ID:  [name] is a 83 y.o. (DOB 1936/07/03) female.  Chief Complaint:  Chief Complaint  Patient presents with  . Follow-up    Anxiety and Depression    HPI [name] presents for follow-up of anxiety and depression.  She is accompanied by her daughter, [name]. Pt reports that her anxiety has been fairly well controlled and is improved compared to the past. Daughter reports that pt continues to have occ "shaky mornings" with anxiety and difficulty getting going. Pt reports, "I'm learning to work through it." Pt reports that being around others and being active seems to be most effective for her anxiety. They deny any recent panic attacks. Denies depressed mood. Pt's daughter reports that pt's mood has been less depressed since resuming Sertraline.  Daughter reports that she and her sister have noticed that tearfulness and crying episodes have resolved. She reports adequate sleep. She reports that her energy and motivation have been good. Appetite has been good and she reports that her weight has been stable. Eating 3 meals daily. She reports adequate concentration and continues to enjoy reading. Denies SI.   Pt reports that she just attended an aqua yoga class and has also been raking leaves. Pt reports that this is helpful for her mental health. Has a new caregiver coming in 3 days a week. She reports that she has had some anxiety in response to change in caregivers and reports that she wants to continue doing some household chores.   Has started with a new therapist, Kathryn Gianotti, LCSW, since previous therapist is going on maternity leave.   Review of Systems:  Review of Systems  Eyes: Positive for visual disturbance.  Musculoskeletal: Negative for gait problem.  Neurological: Positive for tremors.  Psychiatric/Behavioral:       Please refer to HPI    Medications: I have reviewed the patient's current medications.  Current Outpatient Medications  Medication Sig Dispense Refill  . Acetaminophen (TYLENOL PO) Take by mouth as needed.    [name] CALCIUM CITRATE-VITAMIN D PO Take by mouth.    . dorzolamide-timolol (COSOPT) 22.3-6.8 MG/ML ophthalmic solution dorzolamide 22.3 mg-timolol 6.8 mg/mL eye drops    . estradiol (ESTRACE) 0.5 MG tablet     . Multiple Vitamin (MULTIVITAMIN) capsule Take 1 capsule by mouth daily.    [name] OVER THE COUNTER MEDICATION Take 1  packet by mouth daily. Dissolve one packet in a glass of water each morning. Emergen-C    . progesterone (PROMETRIUM) 100 MG capsule Take 100 mg by mouth at bedtime.     . rivastigmine (EXELON) 3 MG capsule Take 1 capsule (3 mg total) by mouth 2 (two) times daily. 60 capsule 11  . sertraline (ZOLOFT) 50 MG tablet Take 1 tablet (50 mg total) by mouth daily. 90 tablet 1  . EPINEPHrine  (EPIPEN) 0.3 mg/0.3 mL SOAJ injection Inject 0.3 mg into the muscle once as needed (for allergic reaction).     No current facility-administered medications for this visit.     Medication Side Effects: None  Allergies:  Allergies  Allergen Reactions  . Aricept [Donepezil Hcl]     Hallucinations, confusion  . Cephalexin     REACTION: diaphoretic and drop in Blood pressure  . Lorazepam Other (See Comments)    amnesia    Past Medical History:  Diagnosis Date  . Allergic rhinitis, cause unspecified   . Contact dermatitis 03/16/2013  . Generalized anxiety disorder 02/19/2013   has seen [name], [name] and now [name] for counseling  . Hemorrhoids, external   . Hot flashes   . Hypertension   . Irritable bowel syndrome   . MDD (major depressive disorder) 03/18/2016  . Other and unspecified hyperlipidemia   . Palpitations 02/19/2013   On-set of symptoms March '14. EKG with NSR     Family History  Problem Relation Age of Onset  . Dementia Mother   . Depression Mother 22  . Heart disease Father   . Cancer Sister        breast  . Heart disease Other   . Heart disease Other   . Heart disease Other   . Cancer Sister        breast  . Hypothyroidism Sister   . Asthma Sister   . Rheum arthritis Paternal Grandmother   . Rheum arthritis Daughter     Social History   Socioeconomic History  . Marital status: Married    Spouse name: Not on file  . Number of children: 4  . Years of education: Not on file  . Highest education level: Not on file  Occupational History  . Occupation: Freight forwarder at [employer]    Comment: Retired  Scientific laboratory technician  . Financial resource strain: Not hard at all  . Food insecurity    Worry: Never true    Inability: Never true  . Transportation needs    Medical: No    Non-medical: No  Tobacco Use  . Smoking status: Former Smoker    Packs/day: 0.10    Years: 1.00    Pack years: 0.10    Types: Cigarettes    Quit date: 12/12/1962     Years since quitting: 56.8  . Smokeless tobacco: Never Used  . Tobacco comment: smoked for 3 months in college   Substance and Sexual Activity  . Alcohol use: Not Currently    Comment: rarely  . Drug use: No  . Sexual activity: Not Currently    Partners: Male  Lifestyle  . Physical activity    Days per week: 2 days    Minutes per session: 30 min  . Stress: To some extent  Relationships  . Social connections    Talks on phone: More than three times a week    Gets together: Twice a week    Attends religious service: Not on file  Active member of club or organization: Yes    Attends meetings of clubs or organizations: More than 4 times per year    Relationship status: Widowed  . Intimate partner violence    Fear of current or ex partner: Not on file    Emotionally abused: Not on file    Physically abused: Not on file    Forced sexual activity: Not on file  Other Topics Concern  . Not on file  Social History Narrative   College grad. Married '61. 4 daughters, 2 grandchildren. 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   Advanced Directives: Has Living Will, HCPOA: [place] [phone]         Pt lives in 2 story home - her husband currently resides in nursing facility      01/22/19: Pt. Lives in [place] home, daughters check in on patient, managing medications   Pt. Still drives, manages yardwork she says   Enjoys swim aerobics at the [facility]    Past Medical History, Surgical history, Social history, and Family history were reviewed and updated as appropriate.   Please see review of systems for further details on the patient's review from today.   Objective:   Physical Exam:  There were no vitals taken for this visit.  Physical Exam Neurological:     Mental Status: She is alert and oriented to person, place, and time.     Cranial Nerves: No dysarthria.  Psychiatric:        Attention and Perception: Attention normal.        Mood and Affect: Mood normal.         Speech: Speech normal.        Behavior: Behavior is cooperative.        Thought Content: Thought content normal. Thought content is not paranoid or delusional. Thought content does not include homicidal or suicidal ideation. Thought content does not include homicidal or suicidal plan.        Cognition and Memory: Cognition normal. She exhibits impaired recent memory.        Judgment: Judgment normal.     Comments: Patient unable to recall some recent information such as eyes not remembering that the laboratories have reopened and that her daughter has taken her to [facility].     Lab Review:     Component Value Date/Time   NA 139 09/16/2019 1246   K 4.4 09/16/2019 1246   CL 102 09/16/2019 1246   CO2 28 09/16/2019 1246   GLUCOSE 75 09/16/2019 1246   BUN 21 09/16/2019 1246   CREATININE 0.76 09/16/2019 1246   CALCIUM 9.8 09/16/2019 1246   PROT 6.6 09/16/2019 1246   ALBUMIN 4.4 09/16/2019 1246   AST 16 09/16/2019 1246   ALT 12 09/16/2019 1246   ALKPHOS 59 09/16/2019 1246   BILITOT 1.1 09/16/2019 1246   GFRNONAA 83 (L) 03/22/2014 1619   GFRAA >90 03/22/2014 1619       Component Value Date/Time   WBC 5.6 09/16/2019 1246   RBC 4.37 09/16/2019 1246   HGB 13.8 09/16/2019 1246   HCT 40.5 09/16/2019 1246   PLT 239.0 09/16/2019 1246   MCV 92.8 09/16/2019 1246   MCH 30.9 03/22/2014 1619   MCHC 34.0 09/16/2019 1246   RDW 12.8 09/16/2019 1246   LYMPHSABS 1.4 09/16/2019 1246   MONOABS 0.5 09/16/2019 1246   EOSABS 0.0 09/16/2019 1246   BASOSABS 0.1 09/16/2019 1246    No results found for: POCLITH, LITHIUM  No results found for: PHENYTOIN, PHENOBARB, VALPROATE, CBMZ   .res Assessment: Plan:   Patient seen for 30 minutes and greater than 50% of visit spent counseling patient coordination of care, to include reviewing neurologist and PCPs notes to reassure patient that providers seem to think that her tremor is likely an essential tremor or due to anxiety and there are not  indications that providers are ruling out Parkinson's disease.  Also discussed patient's questions about continuing sertraline and discussed that her mood, anxiety, panic attacks, energy, motivation, concentration, and appetite have improved with sertraline and that she experienced worsening signs and symptoms when sertraline was stopped, and the signs and symptoms improved again when she resumed sertraline.  Also discussed that she seems to be tolerating sertraline without any adverse effects and she continue to have a tremor while she was not taking sertraline.  Patient agrees to continue sertraline. Patient to follow-up in 3 months or sooner if clinically indicated. Patient advised to contact office with any questions, adverse effects, or acute worsening in signs and symptoms.  Shar was seen today for follow-up.  Diagnoses and all orders for this visit:  Generalized anxiety disorder -     sertraline (ZOLOFT) 50 MG tablet; Take 1 tablet (50 mg total) by mouth daily.  Mild episode of recurrent major depressive disorder (HCC) -     sertraline (ZOLOFT) 50 MG tablet; Take 1 tablet (50 mg total) by mouth daily.    Please see After Visit Summary for patient specific instructions.  Future Appointments  Date Time Provider [place]  10/29/2019  3:00 PM Natividad Brood, [place] [facility] None  11/18/2019  2:00 PM Cameron Sprang, [place] [facility] None  01/10/2020  2:30 PM [name], PMHNP [facility] None    No orders of the defined types were placed in this encounter.     -------------------------------

## 2019-10-17 ENCOUNTER — Ambulatory Visit: Payer: Self-pay | Admitting: Neurology

## 2019-10-29 ENCOUNTER — Ambulatory Visit (INDEPENDENT_AMBULATORY_CARE_PROVIDER_SITE_OTHER): Payer: Medicare Other | Admitting: Psychology

## 2019-10-29 DIAGNOSIS — F411 Generalized anxiety disorder: Secondary | ICD-10-CM | POA: Diagnosis not present

## 2019-11-18 ENCOUNTER — Other Ambulatory Visit: Payer: Self-pay

## 2019-11-18 ENCOUNTER — Telehealth (INDEPENDENT_AMBULATORY_CARE_PROVIDER_SITE_OTHER): Payer: Medicare Other | Admitting: Neurology

## 2019-11-18 ENCOUNTER — Encounter: Payer: Self-pay | Admitting: Neurology

## 2019-11-18 VITALS — Ht 63.0 in | Wt 118.0 lb

## 2019-11-18 DIAGNOSIS — N951 Menopausal and female climacteric states: Secondary | ICD-10-CM

## 2019-11-18 DIAGNOSIS — G301 Alzheimer's disease with late onset: Secondary | ICD-10-CM

## 2019-11-18 DIAGNOSIS — R251 Tremor, unspecified: Secondary | ICD-10-CM

## 2019-11-18 DIAGNOSIS — F0281 Dementia in other diseases classified elsewhere with behavioral disturbance: Secondary | ICD-10-CM | POA: Diagnosis not present

## 2019-11-18 HISTORY — DX: Menopausal and female climacteric states: N95.1

## 2019-11-18 MED ORDER — RIVASTIGMINE TARTRATE 3 MG PO CAPS: 3.0000 mg | ORAL_CAPSULE | Freq: Two times a day (BID) | ORAL | 11 refills | Status: DC

## 2019-11-18 NOTE — Progress Notes (Signed)
Virtual Visit via Video Note The purpose of this virtual visit is to provide medical care while limiting exposure to the novel coronavirus.    Consent was obtained for video visit:  Yes.   Answered questions that patient had about telehealth interaction:  Yes.   I discussed the limitations, risks, security and privacy concerns of performing an evaluation and management service by telemedicine. I also discussed with the patient that there may be a patient responsible charge related to this service. The patient expressed understanding and agreed to proceed.  Pt location: Home Physician Location: office Name of referring provider:  [name], MD I connected with [name] at patients initiation/request on 11/18/2019 at  81:00 PM EST by video enabled telemedicine application and verified that I am speaking with the correct person using two identifiers. Pt MRN:  [id number] Pt DOB:  03-08-1936 Video Participants:  [name];  [name] (daughter)   History of Present Illness:  The patient was seen as a virtual video visit on 11/18/2019. She was last seen 3 months ago for dementia with behavioral disturbance (anxiety). Her daughter [name] is present during the e-visit to provide additional information. MOCA blind done Sept 2020 was 15/22 (13/22 in April 2020). She was concerned that rivastigmine was causing dizziness, however despite stopping medication, she continued to have dizziness and restarted medication. She has increased rivastigmine dose to 3mg  BID without significant issues. They feel that dizziness is related to food intake, dizziness is worse in the morning and better by noon. Shakiness is ramped up if she is not eating as well. She feels the dizziness and shakiness are better, especially when she has a project going. She shakiness is just on her left side. She was busy raking the leaves outside and did well. Her daughter fills an automatic pill dispenser, she takes them  when the alarm goes off. Sometimes she may forget if she gets side tracked, there were 3-4 doses missed in a 2 week period. Her daughter manages finances. She drives [id number] miles to the grocery and swim class without issues. She is sleeping better, anxiety feels better. She has had a couple of bouts of diarrhea.  History on Initial Assessment 12/18/2017: This is a pleasant 83 yo RH woman with a history of hypertension, anxiety, depression, who presented for evaluation of memory loss. She feels her memory is "a little shaky." She endorses a lot of anxiety, and states it is causing her not to recall things and she does not understand it. Family started noticing memory changes around 2 years ago. She has been the main caregiver for her husband with dementia, who had been in and out of the hospital several times. She was "not dealing with things as well as I should." For instance, 2 years ago she was taking care of the managing their family home, but they noticed she was not keeping up with it, and turned it over to her sister. Her daughter took over finances in the Spring 2018 because she was afraid she would forget and she had never done the taxes in the past. She lives alone and denies missing medications. She denies getting lost driving. She has 3 cats at home that are cared for, she does not forget to feed them. She has no difficulties running the household, doing laundry and yardwork. She has learned how to use the leafblower. Their main concern is there anxiety and depression have "really ramped up." She is so stressed and scattered, that  she could not stay on task. Family started noticing anxiety around 4-5 years ago, but in January 2017 she was "in breakdown territory" and started Lexapro and counseling. It appears she was on a very low dose due to concern for side effects. She would wake up every morning between 3-4 AM with a panic attack, unable to reason with herself, shaky. Family reports that she would  forget what time they told her she would be picked up, even if it was in a text message in front of her. They have noticed lack of focus and concentration. She would ask the same question about this doctor's appointment and was quite anxious about today's visit. Family reports she "tends to catastrophize things." No paranoia or hallucinations. She was switched from Lexapro to Prozac at the family's request, and "something about Prozac bothers me all in my head."   She denies any headaches, vertigo, diplopia, dysarthria/dysphagia, neck/back pain, focal numbness/tingling/weakness, bowel/bladder dysfunction. She reports a "mental dizziness," where she states "my brain just feels full." She has tremors, R>L. She had lost her sense of smell after sinus surgery many years ago. Her mother had dementia in her 2s, her older sister has memory issues. No history of significant head injuries, no alcohol use.      Current Outpatient Medications on File Prior to Visit  Medication Sig Dispense Refill   Acetaminophen (TYLENOL PO) Take by mouth as needed.     CALCIUM CITRATE-VITAMIN D PO Take by mouth.     dorzolamide-timolol (COSOPT) 22.3-6.8 MG/ML ophthalmic solution dorzolamide 22.3 mg-timolol 6.8 mg/mL eye drops     EPINEPHrine (EPIPEN) 0.3 mg/0.3 mL SOAJ injection Inject 0.3 mg into the muscle once as needed (for allergic reaction).     estradiol (ESTRACE) 0.5 MG tablet      Multiple Vitamin (MULTIVITAMIN) capsule Take 1 capsule by mouth daily.     OVER THE COUNTER MEDICATION Take 1 packet by mouth daily. Dissolve one packet in a glass of water each morning. Emergen-C     progesterone (PROMETRIUM) 100 MG capsule Take 100 mg by mouth at bedtime.      rivastigmine (EXELON) 3 MG capsule Take 1 capsule (3 mg total) by mouth 2 (two) times daily. 60 capsule 11   sertraline (ZOLOFT) 50 MG tablet Take 1 tablet (50 mg total) by mouth daily. 90 tablet 1   No current facility-administered medications on  file prior to visit.      Observations/Objective:   Vitals:   11/18/19 1329  Weight: 118 lb (53.5 kg)  Height: 5\' 3"  (1.6 m)   GEN:  The patient appears stated age and is in NAD.  Neurological examination: Patient is awake, alert, oriented x 3. No aphasia or dysarthria. Intact fluency and comprehension. Remote and recent memory impaired. SLUMS score 17/30. [place] Mental Exam 11/18/2019  Weekday Correct 1  Current year 1  What state are we in? 1  Amount spent 0  Amount left 0  # of Animals 1  5 objects recall 0  Number series 1  Hour markers 0  Time correct 0  Placed X in triangle correctly 2  Largest Figure 2  Name of female 2  Date back to work 2  Type of work 2  State she lived in 2  Total score 17   Cranial nerves: Extraocular movements intact with no nystagmus. No facial asymmetry. Motor: moves all extremities symmetrically, at least anti-gravity x 4. No incoordination on finger to nose testing. Gait: narrow-based and steady,  with good strides. Initially she ambulated with her left hand flexed, after distraction she extended hand with note of tremor on ambulation. Able to tandem walk adequately. Negative Romberg test.  Assessment and Plan:   This is an 83 yo RH woman with a history of hypertension, anxiety, depression, who presented for evaluation of worsening memory. 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 SLUMS score today 17/30. She is tolerating Rivastigmine 3mg  BID better. Her anxiety appears much improved. She is noted to have a left hand tremor on ambulation today, continue to monitor. Repeat Neuropsychological testing will be ordered for 12 month follow-up. Continue close supervision, monitor driving. Follow-up after Neuropsych testing, they know to call for any changes.   Follow Up Instructions:   -I discussed the assessment and  treatment plan with the patient/daughter. The patient/daughter were provided an opportunity to ask questions and all were answered. The patient/daughter agreed with the plan and demonstrated an understanding of the instructions.   The patient/daughter were advised to call back or seek an in-person evaluation if the symptoms worsen or if the condition fails to improve as anticipated.    Cameron Sprang, MD

## 2019-11-26 ENCOUNTER — Ambulatory Visit (INDEPENDENT_AMBULATORY_CARE_PROVIDER_SITE_OTHER): Payer: Medicare Other | Admitting: Psychology

## 2019-11-26 DIAGNOSIS — F411 Generalized anxiety disorder: Secondary | ICD-10-CM

## 2019-12-11 ENCOUNTER — Ambulatory Visit: Payer: Medicare Other | Attending: Internal Medicine

## 2019-12-11 DIAGNOSIS — Z20822 Contact with and (suspected) exposure to covid-19: Secondary | ICD-10-CM

## 2019-12-12 LAB — NOVEL CORONAVIRUS, NAA: SARS-CoV-2, NAA: NOT DETECTED

## 2019-12-20 ENCOUNTER — Telehealth: Payer: Medicare Other | Admitting: Family Medicine

## 2019-12-26 ENCOUNTER — Ambulatory Visit (INDEPENDENT_AMBULATORY_CARE_PROVIDER_SITE_OTHER): Payer: Medicare Other | Admitting: Psychology

## 2019-12-26 DIAGNOSIS — F411 Generalized anxiety disorder: Secondary | ICD-10-CM

## 2019-12-27 ENCOUNTER — Encounter: Payer: Self-pay | Admitting: Family Medicine

## 2019-12-27 ENCOUNTER — Telehealth (INDEPENDENT_AMBULATORY_CARE_PROVIDER_SITE_OTHER): Payer: Medicare Other | Admitting: Family Medicine

## 2019-12-27 ENCOUNTER — Other Ambulatory Visit: Payer: Self-pay

## 2019-12-27 DIAGNOSIS — I10 Essential (primary) hypertension: Secondary | ICD-10-CM

## 2019-12-27 DIAGNOSIS — F330 Major depressive disorder, recurrent, mild: Secondary | ICD-10-CM | POA: Diagnosis not present

## 2019-12-27 DIAGNOSIS — F411 Generalized anxiety disorder: Secondary | ICD-10-CM | POA: Diagnosis not present

## 2019-12-27 DIAGNOSIS — E785 Hyperlipidemia, unspecified: Secondary | ICD-10-CM

## 2019-12-27 DIAGNOSIS — G3184 Mild cognitive impairment, so stated: Secondary | ICD-10-CM

## 2019-12-27 NOTE — Progress Notes (Signed)
Virtual Visit via Video Note  I connected with [name]  on 12/27/19 at  9:00 AM EST by a video enabled telemedicine application and verified that I am speaking with the correct person using two identifiers.  Location patient: home Location provider:work or home office Persons participating in the virtual visit: patient, provider  I discussed the limitations of evaluation and management by telemedicine and the availability of in person appointments. The patient expressed understanding and agreed to proceed.   [name] DOB: 1936/04/07 Encounter date: 12/27/2019  This is a 84 y.o. female who presents with No chief complaint on file.   History of present illness: Daughter [name] here with her today.   Lost husband to [name] on Sunday; so not been a good week. [name] wanted to talk about resuming blood pressure medication.   [id number] 2 weeks ago mentioned that readings were in high range and she was getting a "whooshing" sound in ears sometimes. Has been taking readings, but not sure that all readings are being done "properly". 138/79, 148/76, but then yesterday was 120/80 and weds was 115/67. This morning was 132/73. Early Jan 115/69. Daughter states that [place] dx with husband right after christmas and then everything in meanwhile. No headaches. No chest pain, chest pressure, breathing trouble. Has been checking pressures in the morning and whooshing was more in afternoon/evening. In past, checking pressures did make her a little anxious.   Hasn't had shingles vaccine yet. Getting COVID vaccine 1/23. Wondering about window between vaccines. Advised min 2 weeks.  They have put water classes on hold; wondering about when to return after this second one.   Anxiety/depression: Anxiety really comes on her in the morning- just wondering about what day was going to bring and what she needed to do. Has some anxiety in afternoon about what did she accomplish in the morning. Benefits from  physical activity during day - things like raking leaves. Does sleep very well. No trouble with falling or staying asleep. Sleeping about 9 hours. Just feels nervous in the morning when she gets up. Had appointment with [name].   Memory loss: Scheduled to go back to [name] in feb for neuropsych testing. Has been a year since last testing.   HL:not on medication.   Still taking the progesterone, estradiol. Has annual gyn exam in march. No hot flashes.   HPI   Allergies  Allergen Reactions  . Aricept [Donepezil Hcl]     Hallucinations, confusion  . Cephalexin     REACTION: diaphoretic and drop in Blood pressure  . Lorazepam Other (See Comments)    amnesia   No outpatient medications have been marked as taking for the 12/27/19 encounter (Appointment) with [name], MD.    Review of Systems  Objective:  There were no vitals taken for this visit.      BP Readings from Last 3 Encounters:  09/16/19 110/70  01/22/19 106/64  01/22/19 106/64   Wt Readings from Last 3 Encounters:  11/18/19 118 lb (53.5 kg)  09/16/19 118 lb 4.8 oz (53.7 kg)  08/14/19 118 lb (53.5 kg)    EXAM:  GENERAL: alert, oriented, appears well and in no acute distress  HEENT: atraumatic, conjunctiva clear, no obvious abnormalities on inspection of external nose and ears  NECK: normal movements of the head and neck  LUNGS: on inspection no signs of respiratory distress, breathing rate appears normal, no obvious gross SOB, gasping or wheezing  CV: no obvious cyanosis  MS: moves all visible extremities without noticeable abnormality  PSYCH/NEURO: pleasant and cooperative, no obvious depression or anxiety, speech and thought processing grossly intact   Assessment/Plan  1. Essential hypertension Has had some elevations at home, but also some pressures on the lower end. I have asked them to monitor at home and report back with twice daily  readings next week.   2. Mild cognitive impairment with memory loss She is following with neurology and has upcoming neuropsych testing. Stable.  3. Hyperlipidemia, unspecified hyperlipidemia type Has been stable without medication.  4. Generalized anxiety disorder Discussed morning meditation and recommended insight timer app. She felt relaxed listening to music in morning as well so encouraged finding more relaxing activities to start day with as well as daily exercise.   5. Mild episode of recurrent major depressive disorder ([place]) Stable; grieving loss of husband now.    Did advise to wait for completion of second covid vaccine before returning to gym. Exercise really helps her, but I would like for her to stay cautious.   I discussed the assessment and treatment plan with the patient. The patient was provided an opportunity to ask questions and all were answered. The patient agreed with the plan and demonstrated an understanding of the instructions.   The patient was advised to call back or seek an in-person evaluation if the symptoms worsen or if the condition fails to improve as anticipated.  I provided 32 minutes of non-face-to-face time during this encounter.   Micheline Rough, MD

## 2020-01-04 ENCOUNTER — Ambulatory Visit: Payer: Medicare Other | Attending: Internal Medicine

## 2020-01-04 DIAGNOSIS — Z23 Encounter for immunization: Secondary | ICD-10-CM | POA: Insufficient documentation

## 2020-01-04 NOTE — Progress Notes (Signed)
   Covid-19 Vaccination Clinic  Name:  [name]    MRN: [id number] DOB: May 27, 1936  01/04/2020  [name] was observed post Covid-19 immunization for 15 minutes without incidence. She was provided with Vaccine Information Sheet and instruction to access the V-Safe system.   [name] was instructed to call 911 with any severe reactions post vaccine: [name] Difficulty breathing  . Swelling of your face and throat  . A fast heartbeat  . A bad rash all over your body  . Dizziness and weakness    Immunizations Administered    Name Date Dose VIS Date Route   Pfizer COVID-19 Vaccine 01/04/2020 12:08 PM 0.3 mL 11/22/2019 Intramuscular   Manufacturer: [place]   Lot: [id number]   [place]: [id number]

## 2020-01-05 ENCOUNTER — Other Ambulatory Visit: Payer: Self-pay | Admitting: Psychiatry

## 2020-01-05 DIAGNOSIS — F411 Generalized anxiety disorder: Secondary | ICD-10-CM

## 2020-01-05 DIAGNOSIS — F330 Major depressive disorder, recurrent, mild: Secondary | ICD-10-CM

## 2020-01-06 ENCOUNTER — Other Ambulatory Visit: Payer: Self-pay

## 2020-01-06 ENCOUNTER — Telehealth: Payer: Self-pay | Admitting: Psychiatry

## 2020-01-06 DIAGNOSIS — F411 Generalized anxiety disorder: Secondary | ICD-10-CM

## 2020-01-06 DIAGNOSIS — F330 Major depressive disorder, recurrent, mild: Secondary | ICD-10-CM

## 2020-01-06 MED ORDER — SERTRALINE HCL 50 MG PO TABS: 50.0000 mg | ORAL_TABLET | Freq: Every day | ORAL | 1 refills | Status: DC

## 2020-01-06 NOTE — Telephone Encounter (Signed)
Looks like she had a 90 day on file but resubmitted again to [facility]

## 2020-01-06 NOTE — Telephone Encounter (Signed)
Please refill her [facility]ertraline to [facility] on file.

## 2020-01-08 DIAGNOSIS — R921 Mammographic calcification found on diagnostic imaging of breast: Secondary | ICD-10-CM | POA: Diagnosis not present

## 2020-01-08 LAB — HM MAMMOGRAPHY

## 2020-01-09 ENCOUNTER — Encounter: Payer: Self-pay | Admitting: Family Medicine

## 2020-01-10 ENCOUNTER — Ambulatory Visit: Payer: Medicare Other | Admitting: Psychiatry

## 2020-01-15 ENCOUNTER — Ambulatory Visit: Payer: Medicare Other | Admitting: Psychology

## 2020-01-21 ENCOUNTER — Encounter: Payer: Self-pay | Admitting: Family Medicine

## 2020-01-25 ENCOUNTER — Ambulatory Visit: Payer: Medicare Other

## 2020-01-26 ENCOUNTER — Ambulatory Visit: Payer: Medicare Other | Attending: Internal Medicine

## 2020-01-26 DIAGNOSIS — Z23 Encounter for immunization: Secondary | ICD-10-CM | POA: Insufficient documentation

## 2020-01-28 ENCOUNTER — Encounter: Payer: Self-pay | Admitting: Psychology

## 2020-01-28 ENCOUNTER — Ambulatory Visit (INDEPENDENT_AMBULATORY_CARE_PROVIDER_SITE_OTHER): Payer: Medicare Other | Admitting: Psychology

## 2020-01-28 ENCOUNTER — Other Ambulatory Visit: Payer: Self-pay

## 2020-01-28 ENCOUNTER — Ambulatory Visit: Payer: Medicare Other | Admitting: Psychology

## 2020-01-28 DIAGNOSIS — F0390 Unspecified dementia without behavioral disturbance: Secondary | ICD-10-CM

## 2020-01-28 DIAGNOSIS — F0150 Vascular dementia without behavioral disturbance: Secondary | ICD-10-CM

## 2020-01-28 DIAGNOSIS — R413 Other amnesia: Secondary | ICD-10-CM

## 2020-01-28 NOTE — Progress Notes (Signed)
   Psychometrician Note   Cognitive testing was administered to [name] by technician [name], B.S. under the supervision of [name], Ph.D., licensed psychologist. [name] did not appear overtly distressed by the testing session, per behavioral observation or via self-report to the technician. Rest breaks were offered.    In considering the patient's current level of functioning, level of presumed impairment, nature of symptoms, emotional and behavioral responses during the interview, level of literacy, and observed level of motivation/effort, a battery of tests was selected and communicated to the psychometrician.   Communication between the psychologist and technician was ongoing throughout the testing session and changes were made as deemed necessary based on patient performance on testing, technician observations and additional pertinent factors such as those listed above.   [name] will return within approximately two weeks for an interactive feedback session with [name] at which time her test performances, clinical impressions, and treatment recommendations will be reviewed in detail. The patient understands she can contact our office should she require our assistance before this time.  90 minutes were spent face-to-face with [name] administering standardized tests. An additional 30 minutes were spent scoring by the technician. [CPT [id number], [id number]  This note reflects time spent with the psychometrician and does not include test scores or any clinical interpretations made by [name]. The full report will follow in a separate note.

## 2020-01-28 NOTE — Progress Notes (Addendum)
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 Score Raw Score Percentile   Geriatric Depression Scale: 9 8 --- Within Normal Limits  Geriatric Anxiety Scale: --- 13 --- Mild  Somatic --- 4 --- Minimal  Cognitive --- 5 --- Mild  Affective --- 4 --- Mild        Additional Questionnaires:       Raw Score Raw Score Percentile   PROMIS Sleep Disturbance Questionnaire: --- 17 --- None to Slight   Informed Consent and Coding/Compliance:   Ms. Maclachlan was provided with a verbal description of the nature and purpose of the present neuropsychological evaluation. Also reviewed were the foreseeable risks and/or discomforts and benefits of the procedure, limits of confidentiality, and mandatory reporting requirements of this provider. The patient was given the opportunity to ask questions and receive answers about the evaluation. Oral consent to participate was provided by the patient.   This evaluation was conducted by Christia Reading, Ph.D., licensed clinical neuropsychologist. Ms. Hosseini completed a comprehensive clinical interview with Dr. Melvyn Novas, billed as one unit [phone], and 120 minutes of cognitive testing and scoring, billed as one unit [phone] and three additional units [id number]. Psychometrist  Milana Kidney, B.S., assisted Dr. Melvyn Novas with test administration and scoring procedures. As a separate and discrete service, Dr. Melvyn Novas spent a total of 180 minutes in interpretation and report writing billed as one unit [phone] and two units [id number].

## 2020-01-29 ENCOUNTER — Encounter: Payer: Self-pay | Admitting: Psychology

## 2020-02-05 ENCOUNTER — Encounter: Payer: Self-pay | Admitting: Psychology

## 2020-02-05 ENCOUNTER — Other Ambulatory Visit: Payer: Self-pay

## 2020-02-05 ENCOUNTER — Telehealth (INDEPENDENT_AMBULATORY_CARE_PROVIDER_SITE_OTHER): Payer: Medicare Other | Admitting: Psychology

## 2020-02-05 DIAGNOSIS — F0150 Vascular dementia without behavioral disturbance: Secondary | ICD-10-CM | POA: Diagnosis not present

## 2020-02-05 DIAGNOSIS — F0390 Unspecified dementia without behavioral disturbance: Secondary | ICD-10-CM

## 2020-02-05 NOTE — Progress Notes (Signed)
   Neuropsychology Feedback Session [name]. [place] Department of Neurology  Reason for Referral:   [name] a 84 y.o. Caucasian female referred by Ellouise Newer, M.D.,to characterize hercurrent cognitive functioning and assist with diagnostic clarity and treatment planning in the context of subjective cognitive decline and previous cognitive testing suggesting a mild neurocognitive disorder, amnestic type.  Feedback:   Ms. [name] completed a comprehensive neuropsychological evaluation on 01/28/2020. Please refer to that encounter for the full report and recommendations. Briefly, results suggested prominent difficulties with retrieval and consolidation aspects of verbal and visual memory. Semantic fluency represented an additional area of likely impairment, while a relative weakness was noted across response inhibition. Relative to her previous neuropsychological evaluation in January 2020, the majority of her scores remained quite stable. This was true of impairments in verbal and visual memory, as well as a relative weakness in response inhibition. However, semantic fluency did see a notable decline. Regarding etiology, I share Dr. McDermott's prior concerns surrounding Alzheimer's disease. Despite being able to learning information quite well over initial learning trials, she appeared to exhibit a rapid forgetting memory profile and was amnestic across several memory measures after short delays. The provision of cues or yes/no recognition options did not appear to improve her memory, which is suggestive of a memory storage deficit. Performance decline in semantic fluency is also further consistent with this presentation. It should be noted that her scores across confrontation naming and visuospatial functioning were stable and strong, which is inconsistent with what is typically expected.  Ms. [name] was accompanied by her daughter during the current virtual feedback  session. Content of the current session focused on the results of her neuropsychological evaluation. Ms. [name] and her daughter were given the opportunity to ask questions and their questions were answered. They were was encouraged to reach out should additional questions arise. A copy of her report was mailed at the conclusion of the visit.      25 minutes were spent conducting the current feedback session with Ms. [name], billed as one unit [phone]

## 2020-02-12 ENCOUNTER — Ambulatory Visit (INDEPENDENT_AMBULATORY_CARE_PROVIDER_SITE_OTHER): Payer: Medicare Other | Admitting: Psychology

## 2020-02-12 DIAGNOSIS — F411 Generalized anxiety disorder: Secondary | ICD-10-CM | POA: Diagnosis not present

## 2020-02-13 DIAGNOSIS — Z01419 Encounter for gynecological examination (general) (routine) without abnormal findings: Secondary | ICD-10-CM | POA: Diagnosis not present

## 2020-02-17 DIAGNOSIS — H401233 Low-tension glaucoma, bilateral, severe stage: Secondary | ICD-10-CM | POA: Diagnosis not present

## 2020-02-20 ENCOUNTER — Telehealth (INDEPENDENT_AMBULATORY_CARE_PROVIDER_SITE_OTHER): Payer: Medicare Other | Admitting: Neurology

## 2020-02-20 ENCOUNTER — Other Ambulatory Visit: Payer: Self-pay

## 2020-02-20 DIAGNOSIS — F0390 Unspecified dementia without behavioral disturbance: Secondary | ICD-10-CM

## 2020-02-20 DIAGNOSIS — F0150 Vascular dementia without behavioral disturbance: Secondary | ICD-10-CM

## 2020-02-20 DIAGNOSIS — G20 Parkinson's disease: Secondary | ICD-10-CM

## 2020-02-20 MED ORDER — CARBIDOPA-LEVODOPA ER 25-100 MG PO TBCR: EXTENDED_RELEASE_TABLET | ORAL | 6 refills | Status: DC

## 2020-02-20 NOTE — Progress Notes (Signed)
Virtual Visit via Video Note The purpose of this virtual visit is to provide medical care while limiting exposure to the novel coronavirus.    Consent was obtained for video visit:  Yes.   Answered questions that patient had about telehealth interaction:  Yes.   I discussed the limitations, risks, security and privacy concerns of performing an evaluation and management service by telemedicine. I also discussed with the patient that there may be a patient responsible charge related to this service. The patient expressed understanding and agreed to proceed.  Pt location: Home Physician Location: office Name of referring provider:  [name], MD I connected with [name] at patients initiation/request on 02/20/2020 at 11:30 AM EST by video enabled telemedicine application and verified that I am speaking with the correct person using two identifiers. Pt MRN:  [id number] Pt DOB:  06-11-1936 Video Participants:  [name];  [name] (daughter)   History of Present Illness:  The patient had a virtual video visit on 02/20/2020. She was last seen 3 months ago for dementia with behavioral disturbance. Her daughter [name] is present today to provide additional information. Since her last visit, she had repeat Neurocognitive testing in February 2021 indicating Major Neurocognitive Disorder, likely due to Alzheimer's disease, towards the mild end of the spectrum. Compared to prior testing a year ago, majority of scores were quite stable. She is currently on rivastigmine 3mg  BID without side effects. Family reports good and bad days, there are times were she has almost like a black out and does not remember completely what she had done. One time she totally cleaned out her closet and then thanked her daughter for doing it, not remembering she had done it. She has trouble forming words. She has clocks reminding her of the date. She continues to drive short distances. She has an alarm for her  medications. Her daughter manages finances. Their main concern today are the tremors. She has been having a shaky morning today. She feels an internal shaking sensation, and her left hand visibly shakes all the time. She feels the anxiety is better, sleep is good. No falls.   History on Initial Assessment 12/18/2017: This is a pleasant 84 yo RH woman with a history of hypertension, anxiety, depression, who presented for evaluation of memory loss. She feels her memory is "a little shaky." She endorses a lot of anxiety, and states it is causing her not to recall things and she does not understand it. Family started noticing memory changes around 2 years ago. She has been the main caregiver for her husband with dementia, who had been in and out of the hospital several times. She was "not dealing with things as well as I should." For instance, 2 years ago she was taking care of the managing their family home, but they noticed she was not keeping up with it, and turned it over to her sister. Her daughter took over finances in the Spring 2018 because she was afraid she would forget and she had never done the taxes in the past. She lives alone and denies missing medications. She denies getting lost driving. She has 3 cats at home that are cared for, she does not forget to feed them. She has no difficulties running the household, doing laundry and yardwork. She has learned how to use the leafblower. Their main concern is there anxiety and depression have "really ramped up." She is so stressed and scattered, that she could not stay on task.  Family started noticing anxiety around 4-5 years ago, but in January 2017 she was "in breakdown territory" and started Lexapro and counseling. It appears she was on a very low dose due to concern for side effects. She would wake up every morning between 3-4 AM with a panic attack, unable to reason with herself, shaky. Family reports that she would forget what time they told her she would  be picked up, even if it was in a text message in front of her. They have noticed lack of focus and concentration. She would ask the same question about this doctor's appointment and was quite anxious about today's visit. Family reports she "tends to catastrophize things." No paranoia or hallucinations. She was switched from Lexapro to Prozac at the family's request, and "something about Prozac bothers me all in my head."   She denies any headaches, vertigo, diplopia, dysarthria/dysphagia, neck/back pain, focal numbness/tingling/weakness, bowel/bladder dysfunction. She reports a "mental dizziness," where she states "my brain just feels full." She has tremors, R>L. She had lost her sense of smell after sinus surgery many years ago. Her mother had dementia in her 60s, her older sister has memory issues. No history of significant head injuries, no alcohol use.      Current Outpatient Medications on File Prior to Visit  Medication Sig Dispense Refill  . Acetaminophen (TYLENOL PO) Take by mouth as needed.    [name] CALCIUM CITRATE-VITAMIN D PO Take by mouth.    . dorzolamide-timolol (COSOPT) 22.3-6.8 MG/ML ophthalmic solution dorzolamide 22.3 mg-timolol 6.8 mg/mL eye drops    . EPINEPHrine (EPIPEN) 0.3 mg/0.3 mL SOAJ injection Inject 0.3 mg into the muscle once as needed (for allergic reaction).    [name] estradiol (ESTRACE) 0.5 MG tablet     . Multiple Vitamin (MULTIVITAMIN) capsule Take 1 capsule by mouth daily.    [name] OVER THE COUNTER MEDICATION Take 1 packet by mouth daily. Dissolve one packet in a glass of water each morning. Emergen-C    . progesterone (PROMETRIUM) 100 MG capsule Take 100 mg by mouth at bedtime.     . rivastigmine (EXELON) 3 MG capsule Take 1 capsule (3 mg total) by mouth 2 (two) times daily. 60 capsule 11  . sertraline (ZOLOFT) 50 MG tablet Take 1 tablet (50 mg total) by mouth daily. 90 tablet 1   No current facility-administered medications on file prior to visit.      Observations/Objective:   GEN:  The patient appears stated age and is in NAD.  Neurological examination: Patient is awake, alert, oriented x 3. No aphasia or dysarthria. Intact fluency and comprehension. Remote and recent memory impaired.Cranial nerves: Extraocular movements intact with no nystagmus. No facial asymmetry. Motor: moves all extremities symmetrically, at least anti-gravity x 4. There is orbiting around the left arm, decreased finger taps on left hand. No incoordination on finger to nose testing. Gait: narrow-based and steady, with good strides. She has a left hand pill rolling tremor on ambulation.   Assessment and Plan:   This is an 84 yo RH woman with a history of hypertension, anxiety, depression, who presented for evaluation of worsening memory. Repeat Neuropsychological testing in February 2021 was stable compared to a year ago, with diagnosis of Major Neurocognitive disorder (dementia) likely due to Alzheimer's disease. Continue Rivastigmine 3mg  BID. Their main concern is the tremor, unable to fully assess for parkinsonism, but she has a pill rolling tremor with ambulation on left hand, decreased finger taps on left. We agreed to do a trial of  Sinemet, side effects discussed, start Sinemet 25/100mg  1/2 tab TID with meals for a week, then increase to 1 tab TID with meals. Continue close supervision, monitor driving. Follow-up in 3 months, they know to call for any changes.   Follow Up Instructions:   -I discussed the assessment and treatment plan with the patient/daughter. The patient/daughter were provided an opportunity to ask questions and all were answered. The patient/daughter agreed with the plan and demonstrated an understanding of the instructions.   The patient/daughter were advised to call back or seek an in-person evaluation if the symptoms worsen or if the condition fails to improve as anticipated.    Cameron Sprang, MD

## 2020-03-04 ENCOUNTER — Encounter: Payer: Self-pay | Admitting: Neurology

## 2020-03-04 ENCOUNTER — Ambulatory Visit (INDEPENDENT_AMBULATORY_CARE_PROVIDER_SITE_OTHER): Payer: Medicare Other | Admitting: Psychology

## 2020-03-04 DIAGNOSIS — F411 Generalized anxiety disorder: Secondary | ICD-10-CM | POA: Diagnosis not present

## 2020-03-09 ENCOUNTER — Telehealth: Payer: Self-pay | Admitting: Neurology

## 2020-03-09 NOTE — Telephone Encounter (Signed)
Pls have her stop medication and see how she does off medication. Thanks

## 2020-03-09 NOTE — Addendum Note (Signed)
Addended by: Amado Coe on: 03/09/2020 02:18 PM   Modules accepted: Orders

## 2020-03-09 NOTE — Telephone Encounter (Signed)
Left message with the after hour service on 03-09-20 @ 12:48 pm   Caller states that she started a new medication a week ago. Carbidopa levidopa ER and states that she is having some strange reactions. Caller would like to know if she can stop the medication caller believes she is dreaming and wakes up and she actually doing those actions she is having hallucination and things are happening that she does not remember   Left arm and hand are shaking and feels numb arm also feels swollen  Please call

## 2020-03-09 NOTE — Telephone Encounter (Signed)
Patient called with concerns she is having constant shakiness and hallucinations with a new medicaton.   She said she recently started carbidopa-levodopa CR 25-100 and thinks she is having some side-effects.  She reports she just had a conversation with her sister but her sister is not in the house.

## 2020-03-09 NOTE — Telephone Encounter (Signed)
Advised patient per Dr Delice Lesch to stop carbidopa levidopa.  Patient agreeable.

## 2020-03-16 ENCOUNTER — Telehealth: Payer: Self-pay | Admitting: Family Medicine

## 2020-03-16 NOTE — Telephone Encounter (Signed)
Patient had FL2 & FMLA forms faxed to have completed  Fax to: [phone]   Disposition: Dr's Folder

## 2020-03-18 ENCOUNTER — Encounter: Payer: Self-pay | Admitting: Family Medicine

## 2020-03-18 NOTE — Telephone Encounter (Signed)
Dr Ethlyn Gallery was informed of the message below and asked that I contact Reynold Bowen as the forms were sent from her.  I called [facility] at [facility] (ph# [phone]) and informed Hildred Alamin of the message below.

## 2020-03-18 NOTE — Telephone Encounter (Signed)
Dr Ethlyn Gallery has questions regarding the FL-2 forms.  Left a message for the pts daughter, [name] to return my call.

## 2020-03-18 NOTE — Telephone Encounter (Signed)
[name] called back and I informed her Dr Ethlyn Gallery had questions regarding her medications and she stated do not complete the forms from [facility] as they have not made a decision yet.  Message forwarded to PCP.

## 2020-03-18 NOTE — Telephone Encounter (Signed)
Noted. Thanks.

## 2020-03-25 ENCOUNTER — Ambulatory Visit (INDEPENDENT_AMBULATORY_CARE_PROVIDER_SITE_OTHER): Payer: Medicare Other | Admitting: Psychology

## 2020-03-25 ENCOUNTER — Ambulatory Visit: Payer: Medicare Other | Admitting: Psychology

## 2020-03-25 DIAGNOSIS — F411 Generalized anxiety disorder: Secondary | ICD-10-CM | POA: Diagnosis not present

## 2020-04-08 ENCOUNTER — Telehealth: Payer: Self-pay | Admitting: Family Medicine

## 2020-04-08 NOTE — Telephone Encounter (Signed)
Independent and Supportive Living Physician's Report to be filled out- placed in Dr's folder.  Fax to: [facility], [place]: [phone]

## 2020-04-10 NOTE — Telephone Encounter (Signed)
Returned to your desk 

## 2020-04-14 ENCOUNTER — Ambulatory Visit (INDEPENDENT_AMBULATORY_CARE_PROVIDER_SITE_OTHER): Payer: Medicare Other | Admitting: Psychology

## 2020-04-14 DIAGNOSIS — F411 Generalized anxiety disorder: Secondary | ICD-10-CM | POA: Diagnosis not present

## 2020-04-14 NOTE — Telephone Encounter (Signed)
Spoke with the pts daughter and reviewed the medication list per PCP.  Annmarie stated to remove the Epi-pen as the pt does not have this Rx and has not used in years.  She is aware the forms were faxed to [address] at [facility] [phone] and sent to be scanned.

## 2020-05-21 ENCOUNTER — Ambulatory Visit (INDEPENDENT_AMBULATORY_CARE_PROVIDER_SITE_OTHER): Payer: Medicare Other | Admitting: Psychology

## 2020-05-21 DIAGNOSIS — F411 Generalized anxiety disorder: Secondary | ICD-10-CM | POA: Diagnosis not present

## 2020-06-03 ENCOUNTER — Ambulatory Visit (INDEPENDENT_AMBULATORY_CARE_PROVIDER_SITE_OTHER): Payer: Medicare Other | Admitting: Psychology

## 2020-06-03 DIAGNOSIS — F411 Generalized anxiety disorder: Secondary | ICD-10-CM | POA: Diagnosis not present

## 2020-06-04 ENCOUNTER — Other Ambulatory Visit: Payer: Self-pay

## 2020-06-04 ENCOUNTER — Telehealth: Payer: Self-pay | Admitting: Neurology

## 2020-06-04 ENCOUNTER — Ambulatory Visit (INDEPENDENT_AMBULATORY_CARE_PROVIDER_SITE_OTHER): Payer: Medicare Other | Admitting: Neurology

## 2020-06-04 ENCOUNTER — Encounter: Payer: Self-pay | Admitting: Neurology

## 2020-06-04 VITALS — BP 144/85 | HR 67 | Resp 18 | Ht 63.5 in | Wt 114.0 lb

## 2020-06-04 DIAGNOSIS — G20 Parkinson's disease: Secondary | ICD-10-CM | POA: Diagnosis not present

## 2020-06-04 DIAGNOSIS — R251 Tremor, unspecified: Secondary | ICD-10-CM | POA: Diagnosis not present

## 2020-06-04 DIAGNOSIS — F0281 Dementia in other diseases classified elsewhere with behavioral disturbance: Secondary | ICD-10-CM | POA: Diagnosis not present

## 2020-06-04 DIAGNOSIS — G301 Alzheimer's disease with late onset: Secondary | ICD-10-CM

## 2020-06-04 DIAGNOSIS — F02818 Dementia in other diseases classified elsewhere, unspecified severity, with other behavioral disturbance: Secondary | ICD-10-CM

## 2020-06-04 MED ORDER — RIVASTIGMINE TARTRATE 6 MG PO CAPS: 6.0000 mg | ORAL_CAPSULE | Freq: Two times a day (BID) | ORAL | 11 refills | Status: DC

## 2020-06-04 NOTE — Telephone Encounter (Signed)
Patients daughter called to let Dr. Delice Lesch know that patients retirement community ([facility]) does have a PT. Is requesting PT referral be sent to their wellness dept  Fax # [phone]  Phone # [phone] Wellness Director is Festus Aloe

## 2020-06-04 NOTE — Patient Instructions (Addendum)
1. Increase Rivastigmine to 6mg : take 1 capsule twice a day  2. We will send the order for the DATscan, this is a test looking at the neurotransmitter Dopamine in your brain, which has a certain pattern with Parkinson's disease  3. After the DATscan, we will plan to restart very low dose of Sinemet 25/100mg  1/2 tablet once a day before breakfast  4. Continue working with your psychiatrist and counselor to address anxiety  5. Continue to monitor driving  6. Follow-up in 4 months, call for any changes

## 2020-06-04 NOTE — Progress Notes (Addendum)
NEUROLOGY FOLLOW UP OFFICE NOTE  KENDELL GAMMON [id number] 1936-03-14  HISTORY OF PRESENT ILLNESS: I had the pleasure of seeing [name] in follow-up in the neurology clinic on 06/04/2020.  The patient was last seen 3 months ago for dementia with behavioral disturbance and tremors. She is again accompanied by her daughter [name] who helps supplement the history today. One her last visit, main concern were the tremors, she reported an internal shaking sensation, exam on video showed pill rolling tremor on the left hand with ambulation, decreased finger taps on the left. We did a trial of Sinemet 25/100mg  1/2 tab TID with meals, then increase to 1 tab TID. She called 2 weeks later to report dreaming and waking up doing the actions, hallucinations and things happening that she does not remember, Sinemet stopped. Despite stopping Sinemet, they continue to report occasional hallucinations, as well as gaps in time. She is concerned that people are telling her she did something and she has not memory of it at all, she has been told she got out of bed and cooked a meal and does not remember it. She cannot hold on to a name for 20 seconds, which worries her now that she is living at [facility] and meeting new people. She moved at the end of May and this threw her for a loop, she had a real breakdown, "a puddle on the floor inconsolable." They had to bring her back home, then a few days later she drove herself back to [facility]. A couple of weeks later, she said she had not been home for a while, not recalling she was home. They feel her memory issues have exacerbated, with more emotional breakdowns. She is worried she is not feeding her cat or bathing. She does forget to bathe sometimes. She now sees a counselor in addition to Psychiatry, taking Sertraline 50mg  daily. Her daughters report hallucinations and paranoia, thinking her daughters are ganging up on her, threatening to call a lawyer  the day she moved to [place]. She had occasional hallucinations about a man changing her cloths, or thought her sister slept on the couch (sister is in [place]). She gets the cordless phone mixed with the remote control. She reports that she cannot just hop out of bed in the morning, [name] reminds her this has been for at least 2 years. She is on Rivastigmine 3mg  BID. She has tripped and fallen a few times, mostly because of depth perception/spatial awareness issues.    History on Initial Assessment 12/18/2017: This is a pleasant 84 yo RH woman with a history of hypertension, anxiety, depression, who presented for evaluation of memory loss. She feels her memory is "a little shaky." She endorses a lot of anxiety, and states it is causing her not to recall things and she does not understand it. Family started noticing memory changes around 2 years ago. She has been the main caregiver for her husband with dementia, who had been in and out of the hospital several times. She was "not dealing with things as well as I should." For instance, 2 years ago she was taking care of the managing their family home, but they noticed she was not keeping up with it, and turned it over to her sister. Her daughter took over finances in the Spring 2018 because she was afraid she would forget and she had never done the taxes in the past. She lives alone and denies missing medications. She denies getting lost driving. She  has 3 cats at home that are cared for, she does not forget to feed them. She has no difficulties running the household, doing laundry and yardwork. She has learned how to use the leafblower. Their main concern is there anxiety and depression have "really ramped up." She is so stressed and scattered, that she could not stay on task. Family started noticing anxiety around 4-5 years ago, but in January 2017 she was "in breakdown territory" and started Lexapro and counseling. It appears she was on a very low dose  due to concern for side effects. She would wake up every morning between 3-4 AM with a panic attack, unable to reason with herself, shaky. Family reports that she would forget what time they told her she would be picked up, even if it was in a text message in front of her. They have noticed lack of focus and concentration. She would ask the same question about this doctor's appointment and was quite anxious about today's visit. Family reports she "tends to catastrophize things." No paranoia or hallucinations. She was switched from Lexapro to Prozac at the family's request, and "something about Prozac bothers me all in my head."   She denies any headaches, vertigo, diplopia, dysarthria/dysphagia, neck/back pain, focal numbness/tingling/weakness, bowel/bladder dysfunction. She reports a "mental dizziness," where she states "my brain just feels full." She has tremors, R>L. She had lost her sense of smell after sinus surgery many years ago. Her mother had dementia in her 45s, her older sister has memory issues. No history of significant head injuries, no alcohol use.     PAST MEDICAL HISTORY: Past Medical History:  Diagnosis Date  . Allergic rhinitis 02/07/2008   Qualifier: Diagnosis of  By: [place], [place]    . Contact dermatitis 03/16/2013  . Generalized anxiety disorder 02/19/2013   has seen [name], [name] and now [name] for counseling  . Hemorrhoids, external   . Hot flashes   . Hyperlipemia 02/07/2008   Qualifier: Diagnosis of  By: Danny Lawless CMA, [place]   Long standing problem. Pretreatment LDL 175 in 2006, 210 in 2010.  Had tried lipitor but question of rash after 3 days of use. Switched to zocor but reports having leg pain. Retried Lipitor - weakness.  Currently on no medications   . Hypertension   . Irritable bowel syndrome   . Major depressive disorder 03/18/2016  . Major neurocognitive disorder, possible Alzheimer's disease 01/27/2019  . Palpitations 02/19/2013   On-set of  symptoms March '14. EKG with NSR     MEDICATIONS: Current Outpatient Medications on File Prior to Visit  Medication Sig Dispense Refill  . Acetaminophen (TYLENOL PO) Take by mouth as needed.    [name] CALCIUM CITRATE-VITAMIN D PO Take by mouth.    . dorzolamide-timolol (COSOPT) 22.3-6.8 MG/ML ophthalmic solution dorzolamide 22.3 mg-timolol 6.8 mg/mL eye drops    . estradiol (ESTRACE) 0.5 MG tablet     . Multiple Vitamin (MULTIVITAMIN) capsule Take 1 capsule by mouth daily.    [name] OVER THE COUNTER MEDICATION Take 1 packet by mouth daily. Dissolve one packet in a glass of water each morning. Emergen-C    . progesterone (PROMETRIUM) 100 MG capsule Take 100 mg by mouth at bedtime.     . rivastigmine (EXELON) 3 MG capsule Take 1 capsule (3 mg total) by mouth 2 (two) times daily. 60 capsule 11  . sertraline (ZOLOFT) 50 MG tablet Take 1 tablet (50 mg total) by mouth daily. 90 tablet 1   No  current facility-administered medications on file prior to visit.    ALLERGIES: Allergies  Allergen Reactions  . Aricept [Donepezil Hcl]     Hallucinations, confusion  . Cephalexin     REACTION: diaphoretic and drop in Blood pressure  . Lorazepam Other (See Comments)    amnesia  . Sinemet [Carbidopa-Levodopa]     confusion    FAMILY HISTORY: Family History  Problem Relation Age of Onset  . Dementia Mother   . Depression Mother 58  . Heart disease Father   . Cancer Sister        breast  . Heart disease Other   . Heart disease Other   . Heart disease Other   . Cancer Sister        breast  . Hypothyroidism Sister   . Asthma Sister   . Rheum arthritis Paternal Grandmother   . Rheum arthritis Daughter     SOCIAL HISTORY: Social History   Socioeconomic History  . Marital status: Widowed    Spouse name: Not on file  . Number of children: 4  . Years of education: 16  . Highest education level: Bachelor's degree (e.g., BA, AB, BS)  Occupational History  . Occupation: Freight forwarder at [employer]    Comment: Retired  Tobacco Use  . Smoking status: Former Smoker    Packs/day: 0.10    Years: 1.00    Pack years: 0.10    Types: Cigarettes    Quit date: 12/12/1962    Years since quitting: 57.5  . Smokeless tobacco: Never Used  . Tobacco comment: smoked for 3 months in college   Vaping Use  . Vaping Use: Never used  Substance and Sexual Activity  . Alcohol use: Not Currently    Comment: rarely  . Drug use: No  . Sexual activity: Not Currently    Partners: Male  Other Topics Concern  . Not on file  Social History Narrative   College grad. Married '61. 4 daughters, 2 grandchildren. 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   Advanced Directives: Has Living Will, HCPOA: Latrece Nitta [phone]         Pt lives in 2 story home - her husband currently resides in nursing facility      01/22/19: Pt. Lives in [place] home, daughters check in on patient, managing medications   Pt. Still drives, manages yardwork she says   Enjoys swim aerobics at the [facility]   Right handed   Social Determinants of Health   Financial Resource Strain:   . Difficulty of Paying Living Expenses:   Food Insecurity:   . Worried About Charity fundraiser in the Last Year:   . Arboriculturist in the Last Year:   Transportation Needs:   . Film/video editor (Medical):   [name] Lack of Transportation (Non-Medical):   Physical Activity:   . Days of Exercise per Week:   . Minutes of Exercise per Session:   Stress:   . Feeling of Stress :   Social Connections:   . Frequency of Communication with Friends and Family:   . Frequency of Social Gatherings with Friends and Family:   . Attends Religious Services:   . Active Member of Clubs or Organizations:   . Attends Archivist Meetings:   [name] Marital Status:   Intimate Partner Violence:   . Fear of Current or Ex-Partner:   . Emotionally Abused:   [name] Physically Abused:   . Sexually Abused:  PHYSICAL EXAM: Vitals:   06/04/20 1041   BP: (!) 144/85  Pulse: 67  Resp: 18  SpO2: 98%   General: No acute distress, anxious, hypomimia Head:  Normocephalic/atraumatic Skin/Extremities: No rash, no edema Neurological Exam: alert and oriented to person, place, and time. No aphasia or dysarthria. Fund of knowledge is appropriate.  Recent and remote memory are impaired.  Attention and concentration are reduced. Cranial nerves: Pupils equal, round, reactive to light.  Extraocular movements intact with no nystagmus. Visual fields full. No facial asymmetry.  Motor: +cogwheeling with distraction on left, muscle strength 5/5 throughout with no pronator drift.  Finger to nose testing intact.  Gait: able to rise with arms crossed over chest. Good stride length with decreased arm swing on left and pill rolling tremor on ambulation. She has a resting tremor on the left hand. Minimal postural and endpoint tremor. Decreased finger taps on left. Fair foot taps. No postural instability.  IMPRESSION: This is an 84 yo RH woman with a history of hypertension, anxiety, depression, Neuropsychological testing in February 2021 was stable compared to a year ago, with diagnosis of Major Neurocognitive disorder (dementia) likely due to Alzheimer's disease. They are reporting progressive cognitive decline with occasional paranoia and hallucinations, increase Rivastigmine to 6mg  BID. If side effects occur, we will reduce back to 3mg  BID and add on Memantine. She again has left hand tremor with cogwheeling, bradykinesia on left, no postural instability. We discussed doing a DATscan to help with differential diagnosis, and plan to restart low dose Sinemet 1/2 tab qAM after scan. She has had several falls, reporting depth perception difficulties. She will be referred for PT/Balance therapy. She was advised to continue follow-up with [facility], she is more anxious today. Continue to monitor driving. Follow-up in 4 months, they know to call for any changes.   Thank  you for allowing me to participate in her care.  Please do not hesitate to call for any questions or concerns.   Ellouise Newer, M.D.   CC: Dr. Ethlyn Gallery

## 2020-06-04 NOTE — Telephone Encounter (Signed)
[name] is in the office and noticed the pharmacy we had on file was incorrect. [facility] [phone] is the pharmacy that needs to be on file for the patient.

## 2020-06-04 NOTE — Addendum Note (Signed)
Addended by: Jake Seats on: 06/04/2020 04:09 PM   Modules accepted: Orders

## 2020-06-04 NOTE — Telephone Encounter (Signed)
Pharmacy placed on file in notes for pt, could not be added to pharmacy list

## 2020-06-04 NOTE — Telephone Encounter (Signed)
Yes pls, pls order PT/Balance therapy; dx: gait instability, parkinsonism. Thanks

## 2020-06-05 ENCOUNTER — Other Ambulatory Visit: Payer: Self-pay

## 2020-06-05 DIAGNOSIS — R2681 Unsteadiness on feet: Secondary | ICD-10-CM

## 2020-06-05 DIAGNOSIS — G20 Parkinson's disease: Secondary | ICD-10-CM

## 2020-06-05 NOTE — Telephone Encounter (Signed)
Orders places in epic and faxed to [facility]

## 2020-06-16 ENCOUNTER — Ambulatory Visit (INDEPENDENT_AMBULATORY_CARE_PROVIDER_SITE_OTHER): Payer: Medicare Other | Admitting: Psychology

## 2020-06-16 DIAGNOSIS — F411 Generalized anxiety disorder: Secondary | ICD-10-CM | POA: Diagnosis not present

## 2020-06-19 DIAGNOSIS — R41841 Cognitive communication deficit: Secondary | ICD-10-CM | POA: Diagnosis not present

## 2020-06-19 DIAGNOSIS — R2681 Unsteadiness on feet: Secondary | ICD-10-CM | POA: Diagnosis not present

## 2020-06-19 DIAGNOSIS — R260 Ataxic gait: Secondary | ICD-10-CM | POA: Diagnosis not present

## 2020-06-22 DIAGNOSIS — R2681 Unsteadiness on feet: Secondary | ICD-10-CM | POA: Diagnosis not present

## 2020-06-22 DIAGNOSIS — R41841 Cognitive communication deficit: Secondary | ICD-10-CM | POA: Diagnosis not present

## 2020-06-22 DIAGNOSIS — R260 Ataxic gait: Secondary | ICD-10-CM | POA: Diagnosis not present

## 2020-06-24 DIAGNOSIS — R260 Ataxic gait: Secondary | ICD-10-CM | POA: Diagnosis not present

## 2020-06-24 DIAGNOSIS — R41841 Cognitive communication deficit: Secondary | ICD-10-CM | POA: Diagnosis not present

## 2020-06-24 DIAGNOSIS — R2681 Unsteadiness on feet: Secondary | ICD-10-CM | POA: Diagnosis not present

## 2020-06-30 DIAGNOSIS — R2681 Unsteadiness on feet: Secondary | ICD-10-CM | POA: Diagnosis not present

## 2020-06-30 DIAGNOSIS — R41841 Cognitive communication deficit: Secondary | ICD-10-CM | POA: Diagnosis not present

## 2020-06-30 DIAGNOSIS — R260 Ataxic gait: Secondary | ICD-10-CM | POA: Diagnosis not present

## 2020-07-02 ENCOUNTER — Other Ambulatory Visit: Payer: Self-pay

## 2020-07-02 ENCOUNTER — Ambulatory Visit (HOSPITAL_COMMUNITY)
Admission: RE | Admit: 2020-07-02 | Discharge: 2020-07-02 | Disposition: A | Discharge status: Home / Self Care | Payer: Medicare Other | Source: Ambulatory Visit | Attending: Neurology | Admitting: Neurology

## 2020-07-02 ENCOUNTER — Encounter (HOSPITAL_COMMUNITY)
Admission: RE | Admit: 2020-07-02 | Discharge: 2020-07-02 | Disposition: A | Discharge status: Home / Self Care | Payer: Medicare Other | Source: Ambulatory Visit | Attending: Neurology | Admitting: Neurology

## 2020-07-02 DIAGNOSIS — R251 Tremor, unspecified: Secondary | ICD-10-CM | POA: Diagnosis not present

## 2020-07-02 MED ORDER — IODINE STRONG (LUGOLS) 5 % PO SOLN
0.8000 mL | Freq: Once | ORAL | Status: AC
  Administered 2020-07-02: 0.8 mL via ORAL

## 2020-07-03 DIAGNOSIS — R2681 Unsteadiness on feet: Secondary | ICD-10-CM | POA: Diagnosis not present

## 2020-07-03 DIAGNOSIS — R41841 Cognitive communication deficit: Secondary | ICD-10-CM | POA: Diagnosis not present

## 2020-07-03 DIAGNOSIS — R260 Ataxic gait: Secondary | ICD-10-CM | POA: Diagnosis not present

## 2020-07-07 DIAGNOSIS — R2681 Unsteadiness on feet: Secondary | ICD-10-CM | POA: Diagnosis not present

## 2020-07-07 DIAGNOSIS — R41841 Cognitive communication deficit: Secondary | ICD-10-CM | POA: Diagnosis not present

## 2020-07-07 DIAGNOSIS — R260 Ataxic gait: Secondary | ICD-10-CM | POA: Diagnosis not present

## 2020-07-09 DIAGNOSIS — R260 Ataxic gait: Secondary | ICD-10-CM | POA: Diagnosis not present

## 2020-07-09 DIAGNOSIS — R2681 Unsteadiness on feet: Secondary | ICD-10-CM | POA: Diagnosis not present

## 2020-07-09 DIAGNOSIS — R41841 Cognitive communication deficit: Secondary | ICD-10-CM | POA: Diagnosis not present

## 2020-07-13 DIAGNOSIS — R260 Ataxic gait: Secondary | ICD-10-CM | POA: Diagnosis not present

## 2020-07-13 DIAGNOSIS — R2681 Unsteadiness on feet: Secondary | ICD-10-CM | POA: Diagnosis not present

## 2020-07-13 DIAGNOSIS — R413 Other amnesia: Secondary | ICD-10-CM | POA: Diagnosis not present

## 2020-07-13 DIAGNOSIS — R296 Repeated falls: Secondary | ICD-10-CM | POA: Diagnosis not present

## 2020-07-13 DIAGNOSIS — G20 Parkinson's disease: Secondary | ICD-10-CM | POA: Diagnosis not present

## 2020-07-13 DIAGNOSIS — R41841 Cognitive communication deficit: Secondary | ICD-10-CM | POA: Diagnosis not present

## 2020-07-14 ENCOUNTER — Ambulatory Visit (INDEPENDENT_AMBULATORY_CARE_PROVIDER_SITE_OTHER): Payer: Medicare Other | Admitting: Psychology

## 2020-07-14 DIAGNOSIS — R41841 Cognitive communication deficit: Secondary | ICD-10-CM | POA: Diagnosis not present

## 2020-07-14 DIAGNOSIS — F411 Generalized anxiety disorder: Secondary | ICD-10-CM | POA: Diagnosis not present

## 2020-07-14 DIAGNOSIS — R2681 Unsteadiness on feet: Secondary | ICD-10-CM | POA: Diagnosis not present

## 2020-07-14 DIAGNOSIS — R260 Ataxic gait: Secondary | ICD-10-CM | POA: Diagnosis not present

## 2020-07-15 DIAGNOSIS — R41841 Cognitive communication deficit: Secondary | ICD-10-CM | POA: Diagnosis not present

## 2020-07-15 DIAGNOSIS — R260 Ataxic gait: Secondary | ICD-10-CM | POA: Diagnosis not present

## 2020-07-15 DIAGNOSIS — R2681 Unsteadiness on feet: Secondary | ICD-10-CM | POA: Diagnosis not present

## 2020-07-16 DIAGNOSIS — R2681 Unsteadiness on feet: Secondary | ICD-10-CM | POA: Diagnosis not present

## 2020-07-16 DIAGNOSIS — R41841 Cognitive communication deficit: Secondary | ICD-10-CM | POA: Diagnosis not present

## 2020-07-16 DIAGNOSIS — R260 Ataxic gait: Secondary | ICD-10-CM | POA: Diagnosis not present

## 2020-07-21 DIAGNOSIS — R260 Ataxic gait: Secondary | ICD-10-CM | POA: Diagnosis not present

## 2020-07-21 DIAGNOSIS — R2681 Unsteadiness on feet: Secondary | ICD-10-CM | POA: Diagnosis not present

## 2020-07-21 DIAGNOSIS — R41841 Cognitive communication deficit: Secondary | ICD-10-CM | POA: Diagnosis not present

## 2020-07-22 ENCOUNTER — Telehealth: Payer: Self-pay | Admitting: Neurology

## 2020-07-22 NOTE — Telephone Encounter (Signed)
Spoke to daughter [name] about DATscan, showing changes seen with parkinsonian syndromes. Dtr reports hallucinations in her apartment. Discussed restarting Sinemet 25/100mg : Take 1/2 tab TID with meals.  Mahina, pls send order to [facility] to start Sinemet 25/100mg : Take 1/2 tab TID with meals. Thanks

## 2020-07-23 ENCOUNTER — Telehealth: Payer: Self-pay | Admitting: Neurology

## 2020-07-23 DIAGNOSIS — R260 Ataxic gait: Secondary | ICD-10-CM | POA: Diagnosis not present

## 2020-07-23 DIAGNOSIS — R41841 Cognitive communication deficit: Secondary | ICD-10-CM | POA: Diagnosis not present

## 2020-07-23 DIAGNOSIS — R2681 Unsteadiness on feet: Secondary | ICD-10-CM | POA: Diagnosis not present

## 2020-07-23 MED ORDER — CARBIDOPA-LEVODOPA 25-100 MG PO TABS
0.5000 | ORAL_TABLET | Freq: Three times a day (TID) | ORAL | 0 refills | Status: DC
Start: 2020-07-23 — End: 2020-10-05

## 2020-07-23 MED ORDER — CARBIDOPA-LEVODOPA 25-100 MG PO TABS
0.5000 | ORAL_TABLET | Freq: Three times a day (TID) | ORAL | 1 refills | Status: DC
Start: 2020-07-23 — End: 2020-07-23

## 2020-07-23 NOTE — Addendum Note (Signed)
Addended by: Armen Pickup A on: 07/23/2020 02:26 PM   Modules accepted: Orders

## 2020-07-23 NOTE — Telephone Encounter (Signed)
Patient's daughter called in. She found out her mother has not been taking the increased dose of the rivastigmine that was changed in June. The facility that she lives in has not been giving her the increased dosage. The patient's daughter would like some advice.

## 2020-07-23 NOTE — Telephone Encounter (Signed)
Rx has been created and signed by Dr. Loretta Plume due to Dr. Delice Lesch being out of office. Rx was faxed to [facility] at [phone]

## 2020-07-23 NOTE — Telephone Encounter (Signed)
Spoke with daughter. She states pt was to have started incr dose of rivastigmine 6 mg BID in June, was never incr because pt at ALF and they never got the incr dose because [facility]. Daughter wanting to know if pt should be started on Sinemet tomorrow because of her mom being sensitive to meds and concerns over knowing which med causing ill effects if she has problems. Told her Dr Delice Lesch OOO but will clarify with covering provider. Will send script for incr rivastigmine to [facility] at [facility]F and provide guidance to ALF on medications when provider updates. She verbalized understanding.

## 2020-07-23 NOTE — Addendum Note (Signed)
Addended by: Armen Pickup A on: 07/23/2020 03:01 PM   Modules accepted: Orders

## 2020-07-24 MED ORDER — RIVASTIGMINE TARTRATE 6 MG PO CAPS: 6.0000 mg | ORAL_CAPSULE | Freq: Two times a day (BID) | ORAL | 11 refills | Status: DC

## 2020-07-24 NOTE — Telephone Encounter (Signed)
Spoke with daughter, Helene Kelp, and told her I'd send script for Rivastigmine 6 mg to [facility]. I will also talk to nurse there about plan to hold Sinemet until Dr Delice Lesch returns on Mon 8/16 to direct transition based on previous discussion, she verbalized understanding.  Called [facility] and they requested I call back around 11:00 AM when nurse arrives to give instructions.  [facility] and cancelled previous script for Rivastigmine that they have on file.

## 2020-07-24 NOTE — Telephone Encounter (Signed)
Start increased rivastigmine but hold Sinemet and would contact office again on Monday when Dr. Delice Lesch returns.

## 2020-07-24 NOTE — Telephone Encounter (Signed)
Spoke with Denton Ar at [facility] and inst to hold Sinemet until Dr Delice Lesch is back in the office next week to clarify instructions but to start the rivastigmine 6 mg BID now, she verbalized understanding.

## 2020-07-25 ENCOUNTER — Emergency Department (HOSPITAL_COMMUNITY): Payer: Medicare Other

## 2020-07-25 ENCOUNTER — Encounter (HOSPITAL_COMMUNITY): Payer: Self-pay

## 2020-07-25 ENCOUNTER — Emergency Department (HOSPITAL_COMMUNITY)
Admission: EM | Admit: 2020-07-25 | Discharge: 2020-07-25 | Disposition: A | Discharge status: Home / Self Care | Payer: Medicare Other | Attending: Emergency Medicine | Admitting: Emergency Medicine

## 2020-07-25 DIAGNOSIS — I672 Cerebral atherosclerosis: Secondary | ICD-10-CM | POA: Diagnosis not present

## 2020-07-25 DIAGNOSIS — R569 Unspecified convulsions: Secondary | ICD-10-CM | POA: Diagnosis not present

## 2020-07-25 DIAGNOSIS — R42 Dizziness and giddiness: Secondary | ICD-10-CM | POA: Diagnosis not present

## 2020-07-25 DIAGNOSIS — G9389 Other specified disorders of brain: Secondary | ICD-10-CM | POA: Diagnosis not present

## 2020-07-25 DIAGNOSIS — Z87891 Personal history of nicotine dependence: Secondary | ICD-10-CM | POA: Diagnosis not present

## 2020-07-25 DIAGNOSIS — F0390 Unspecified dementia without behavioral disturbance: Secondary | ICD-10-CM | POA: Diagnosis not present

## 2020-07-25 DIAGNOSIS — R29810 Facial weakness: Secondary | ICD-10-CM | POA: Diagnosis not present

## 2020-07-25 DIAGNOSIS — G20 Parkinson's disease: Secondary | ICD-10-CM | POA: Insufficient documentation

## 2020-07-25 DIAGNOSIS — Z79899 Other long term (current) drug therapy: Secondary | ICD-10-CM | POA: Diagnosis not present

## 2020-07-25 DIAGNOSIS — I639 Cerebral infarction, unspecified: Secondary | ICD-10-CM | POA: Diagnosis not present

## 2020-07-25 DIAGNOSIS — G319 Degenerative disease of nervous system, unspecified: Secondary | ICD-10-CM | POA: Diagnosis not present

## 2020-07-25 DIAGNOSIS — R410 Disorientation, unspecified: Secondary | ICD-10-CM | POA: Diagnosis not present

## 2020-07-25 DIAGNOSIS — I6782 Cerebral ischemia: Secondary | ICD-10-CM | POA: Diagnosis not present

## 2020-07-25 DIAGNOSIS — R0902 Hypoxemia: Secondary | ICD-10-CM | POA: Diagnosis not present

## 2020-07-25 DIAGNOSIS — I10 Essential (primary) hypertension: Secondary | ICD-10-CM | POA: Diagnosis not present

## 2020-07-25 LAB — CBC
HCT: 40.7 % (ref 36.0–46.0)
Hemoglobin: 13.1 g/dL (ref 12.0–15.0)
MCH: 30.5 pg (ref 26.0–34.0)
MCHC: 32.2 g/dL (ref 30.0–36.0)
MCV: 94.9 fL (ref 80.0–100.0)
Platelets: 216 10*3/uL (ref 150–400)
RBC: 4.29 MIL/uL (ref 3.87–5.11)
RDW: 12.3 % (ref 11.5–15.5)
WBC: 7.9 10*3/uL (ref 4.0–10.5)
nRBC: 0 % (ref 0.0–0.2)

## 2020-07-25 LAB — I-STAT CHEM 8, ED
BUN: 14 mg/dL (ref 8–23)
Calcium, Ion: 1.11 mmol/L — ABNORMAL LOW (ref 1.15–1.40)
Chloride: 103 mmol/L (ref 98–111)
Creatinine, Ser: 0.7 mg/dL (ref 0.44–1.00)
Glucose, Bld: 114 mg/dL — ABNORMAL HIGH (ref 70–99)
HCT: 39 % (ref 36.0–46.0)
Hemoglobin: 13.3 g/dL (ref 12.0–15.0)
Potassium: 3.7 mmol/L (ref 3.5–5.1)
Sodium: 139 mmol/L (ref 135–145)
TCO2: 23 mmol/L (ref 22–32)

## 2020-07-25 LAB — DIFFERENTIAL
Abs Immature Granulocytes: 0.03 10*3/uL (ref 0.00–0.07)
Basophils Absolute: 0.1 10*3/uL (ref 0.0–0.1)
Basophils Relative: 1 %
Eosinophils Absolute: 0 10*3/uL (ref 0.0–0.5)
Eosinophils Relative: 1 %
Immature Granulocytes: 0 %
Lymphocytes Relative: 17 %
Lymphs Abs: 1.4 10*3/uL (ref 0.7–4.0)
Monocytes Absolute: 0.5 10*3/uL (ref 0.1–1.0)
Monocytes Relative: 6 %
Neutro Abs: 5.9 10*3/uL (ref 1.7–7.7)
Neutrophils Relative %: 75 %

## 2020-07-25 LAB — COMPREHENSIVE METABOLIC PANEL
ALT: 16 U/L (ref 0–44)
AST: 22 U/L (ref 15–41)
Albumin: 3.9 g/dL (ref 3.5–5.0)
Alkaline Phosphatase: 51 U/L (ref 38–126)
Anion gap: 11 (ref 5–15)
BUN: 12 mg/dL (ref 8–23)
CO2: 25 mmol/L (ref 22–32)
Calcium: 9.3 mg/dL (ref 8.9–10.3)
Chloride: 102 mmol/L (ref 98–111)
Creatinine, Ser: 0.86 mg/dL (ref 0.44–1.00)
GFR calc Af Amer: >60 mL/min (ref >60)
GFR calc non Af Amer: >60 mL/min (ref >60)
Glucose, Bld: 120 mg/dL — ABNORMAL HIGH (ref 70–99)
Potassium: 3.7 mmol/L (ref 3.5–5.1)
Sodium: 138 mmol/L (ref 135–145)
Total Bilirubin: 1.4 mg/dL — ABNORMAL HIGH (ref 0.3–1.2)
Total Protein: 6.4 g/dL — ABNORMAL LOW (ref 6.5–8.1)

## 2020-07-25 LAB — PROTIME-INR
INR: 1 (ref 0.8–1.2)
Prothrombin Time: 12.7 seconds (ref 11.4–15.2)

## 2020-07-25 LAB — APTT: aPTT: 25 seconds (ref 24–36)

## 2020-07-25 MED ORDER — LORAZEPAM 2 MG/ML IJ SOLN
INTRAMUSCULAR | Status: AC
  Filled 2020-07-25: qty 1

## 2020-07-25 MED ORDER — GADOBUTROL 1 MMOL/ML IV SOLN
5.0000 mL | Freq: Once | INTRAVENOUS | Status: AC | PRN
  Administered 2020-07-25: 5 mL via INTRAVENOUS

## 2020-07-25 MED ORDER — SODIUM CHLORIDE 0.9% FLUSH
3.0000 mL | Freq: Once | INTRAVENOUS | Status: AC
  Administered 2020-07-25: 3 mL via INTRAVENOUS

## 2020-07-25 NOTE — ED Triage Notes (Addendum)
Pt BIB EMS from [facility] . Code stroke called in route. Facility stated that pt had a fall today. Daughter states she had fallen unsure of how . Unsure if she hit her head. Per daughter pt has left sided weakness at baseline due parkinson. Pt has h/o alzheimer's.Pt has h/o left arm shaking.

## 2020-07-25 NOTE — Discharge Instructions (Addendum)
Work-up for stroke and seizure without any acute findings. Neurology feels that symptoms are probably related to her dementia and her Parkinson's disease. Patient cleared by them for discharge home. [facility] came from [facility] but patient's daughter wants to take her home this evening. Would like her to be transported to her house.

## 2020-07-25 NOTE — ED Notes (Signed)
Update given to daughter. Waiting on EEG results

## 2020-07-25 NOTE — Progress Notes (Signed)
STAT EEG complete - results pending. ? ?

## 2020-07-25 NOTE — Code Documentation (Signed)
Stroke Response Nurse Documentation Code Documentation  [name] is a 84 y.o. female arriving to [facility] ED via [facility] on 07/25/2020 with past medical hx of Alzheimer's and parkinsonian syndrome. Code stroke was activated by EMS. Patient from [facility] where she was [facility] at 0800 and now complaining of dizziness, confusion, "lack of control of left arm".  Per facility she had a fall this morning and was found wandering the halls of the facility.  On No antithrombotic. Stroke team at the bedside on patient arrival. Labs drawn and patient cleared for CT by EDP.  Patient to CT with team. NIHSS 4, see documentation for details and code stroke times. Patient with disoriented, left facial droop and dysarthria  on exam. The following imaging was completed: CT. Patient is not a candidate for tPA due to not a stroke Care/Plan. Bedside handoff with ED RN Marcie Bal.    Raliegh Ip  Stroke Response RN

## 2020-07-25 NOTE — ED Notes (Signed)
Pt ambulated to Restroom with assistance. Pt daughter feels comfortable taking pt home and would rather not use PTAR.

## 2020-07-25 NOTE — ED Provider Notes (Signed)
[facility] EMERGENCY DEPARTMENT Provider Note   CSN: [id number] Arrival date & time: 07/25/20  1134     History Chief Complaint  Patient presents with  . Code Stroke    [name] is a 84 y.o. female.   Patient brought in from [facility].  Arrived as a code stroke.  Was called in route by EMS.  Facility stated the patient had a fall today.  Daughter stated that she had fallen but unsure.  Unsure if she hit her head.  Per daughter patient has left-sided weakness at baseline due to Parkinson's and has a history of Alzheimer's.  Patient met by stroke team and patient taken immediately to CT scan.  When patient first arrived.  We did not have the benefit of having the information from the daughter.  Past medical history is significant for hypertension Alzheimer's disease Parkinson's disease.        Past Medical History:  Diagnosis Date  . Allergic rhinitis 02/07/2008   Qualifier: Diagnosis of  By: [place], [place]    . Contact dermatitis 03/16/2013  . Generalized anxiety disorder 02/19/2013   has seen [name], [name] and now [name] for counseling  . Hemorrhoids, external   . Hot flashes   . Hyperlipemia 02/07/2008   Qualifier: Diagnosis of  By: Danny Lawless CMA, [place]   Long standing problem. Pretreatment LDL 175 in 2006, 210 in 2010.  Had tried lipitor but question of rash after 3 days of use. Switched to zocor but reports having leg pain. Retried Lipitor - weakness.  Currently on no medications   . Hypertension   . Irritable bowel syndrome   . Major depressive disorder 03/18/2016  . Major neurocognitive disorder, possible Alzheimer's disease 01/27/2019  . Palpitations 02/19/2013   On-set of symptoms March '14. EKG with NSR     Patient Active Problem List   Diagnosis Date Noted  . Menopausal symptom 11/18/2019  . Major neurocognitive disorder, possible Alzheimer's disease 01/27/2019  . Major depressive disorder 03/18/2016  . Hot flashes  04/03/2014  . Generalized anxiety disorder 02/19/2013  . Hypertension 05/24/2011  . Hyperlipemia 02/07/2008  . Allergic rhinitis 02/07/2008    Past Surgical History:  Procedure Laterality Date  . benign moles removed    . CATARACT EXTRACTION W/ INTRAOCULAR LENS IMPLANT Bilateral    right - Nov '11, left - Nov '13 [place]  . HEMORRHOID SURGERY    . MANDIBLE SURGERY    . sebaceous cyst excised       OB History   No obstetric history on file.     Family History  Problem Relation Age of Onset  . Dementia Mother   . Depression Mother 4  . Heart disease Father   . Cancer Sister        breast  . Heart disease Other   . Heart disease Other   . Heart disease Other   . Cancer Sister        breast  . Hypothyroidism Sister   . Asthma Sister   . Rheum arthritis Paternal Grandmother   . Rheum arthritis Daughter     Social History   Tobacco Use  . Smoking status: Former Smoker    Packs/day: 0.10    Years: 1.00    Pack years: 0.10    Types: Cigarettes    Quit date: 12/12/1962    Years since quitting: 57.7  . Smokeless tobacco: Never Used  . Tobacco comment: smoked for 3 months  in college   Vaping Use  . Vaping Use: Never used  Substance Use Topics  . Alcohol use: Not Currently    Comment: rarely  . Drug use: No    Home Medications Prior to Admission medications   Medication Sig Start Date End Date Taking? Authorizing Provider  Acetaminophen (TYLENOL PO) Take by mouth as needed.    [provider]  CALCIUM CITRATE-VITAMIN D PO Take by mouth.    [provider]  carbidopa-levodopa (SINEMET IR) 25-100 MG tablet Take 0.5 tablets by mouth 3 (three) times daily. 07/23/20   Cameron Sprang, MD  dorzolamide-timolol (COSOPT) 22.3-6.8 MG/ML ophthalmic solution dorzolamide 22.3 mg-timolol 6.8 mg/mL eye drops    [provider]  estradiol (ESTRACE) 0.5 MG tablet  01/03/19   [provider]  ipratropium (ATROVENT) 0.03 % nasal spray  Place 2 sprays into both nostrils 3 (three) times daily as needed for rhinitis. 08/03/20   [name], MD  Multiple Vitamin (MULTIVITAMIN) capsule Take 1 capsule by mouth daily.    [provider]  OVER THE COUNTER MEDICATION Take 1 packet by mouth daily. Dissolve one packet in a glass of water each morning. Emergen-C    [provider]  progesterone (PROMETRIUM) 100 MG capsule Take 100 mg by mouth at bedtime.     [provider]  psyllium (METAMUCIL SMOOTH TEXTURE) 58.6 % powder Take 1 packet by mouth daily. 08/03/20   [name], MD  rivastigmine (EXELON) 3 MG capsule Take 3 mg by mouth 2 (two) times daily.    [provider]  sertraline (ZOLOFT) 50 MG tablet Take 1 tablet (50 mg total) by mouth daily. 01/06/20   Thayer Headings, PMHNP    Allergies    Aricept Reather Littler hcl], Cephalexin, Lorazepam, Melatonin, and Sinemet [carbidopa-levodopa]  Review of Systems   Review of Systems  Unable to perform ROS: Dementia    Physical Exam Updated Vital Signs BP 136/71   Pulse (!) 54   Resp 13   SpO2 97%   Physical Exam Vitals and nursing note reviewed.  Constitutional:      General: She is in acute distress.     Appearance: Normal appearance. She is well-developed.  HENT:     Head: Normocephalic and atraumatic.  Eyes:     Extraocular Movements: Extraocular movements intact.     Conjunctiva/sclera: Conjunctivae normal.     Pupils: Pupils are equal, round, and reactive to light.  Cardiovascular:     Rate and Rhythm: Normal rate and regular rhythm.     Heart sounds: No murmur heard.   Pulmonary:     Effort: Pulmonary effort is normal. No respiratory distress.     Breath sounds: Normal breath sounds.  Abdominal:     Palpations: Abdomen is soft.     Tenderness: There is no abdominal tenderness.  Musculoskeletal:        General: No swelling. Normal range of motion.     Cervical back: Normal range of motion and neck supple.  Skin:     General: Skin is warm and dry.     Capillary Refill: Capillary refill takes less than 2 seconds.  Neurological:     Mental Status: She is alert. Mental status is at baseline.     Motor: Weakness present.     ED Results / Procedures / Treatments   Labs (all labs ordered are listed, but only abnormal results are displayed) Labs Reviewed  COMPREHENSIVE METABOLIC PANEL - Abnormal; Notable for the  following components:      Result Value   Glucose, Bld 120 (*)    Total Protein 6.4 (*)    Total Bilirubin 1.4 (*)    All other components within normal limits  I-STAT CHEM 8, ED - Abnormal; Notable for the following components:   Glucose, Bld 114 (*)    Calcium, Ion 1.11 (*)    All other components within normal limits  PROTIME-INR  APTT  CBC  DIFFERENTIAL  CBG MONITORING, ED    EKG EKG Interpretation  Date/Time:  Saturday July 25 2020 12:06:06 EDT Ventricular Rate:  139 PR Interval:    QRS Duration: 95 QT Interval:  467 QTC Calculation: 974 R Axis:   71 Text Interpretation: Normal sinus rhythm Artifact Electrode noise No significant change since last tracing Nonspecific ST and T wave abnormality Confirmed by Fredia Sorrow [phone]) on 07/25/2020 12:14:08 PM   Radiology No results found.  Procedures Procedures (including critical care time)  CRITICAL CARE Performed by: Fredia Sorrow Total critical care time: 45 minutes Critical care time was exclusive of separately billable procedures and treating other patients. Critical care was necessary to treat or prevent imminent or life-threatening deterioration. Critical care was time spent personally by me on the following activities: development of treatment plan with patient and/or surrogate as well as nursing, discussions with consultants, evaluation of patient's response to treatment, examination of patient, obtaining history from patient or surrogate, ordering and performing treatments and interventions, ordering and  review of laboratory studies, ordering and review of radiographic studies, pulse oximetry and re-evaluation of patient's condition.   Medications Ordered in ED Medications  sodium chloride flush (NS) 0.9 % injection 3 mL (3 mLs Intravenous Given 07/25/20 1212)  LORazepam (ATIVAN) 2 MG/ML injection (  Given 07/25/20 1219)  gadobutrol (GADAVIST) 1 MMOL/ML injection 5 mL (5 mLs Intravenous Contrast Given 07/25/20 1452)    ED Course  I have reviewed the triage vital signs and the nursing notes.  Pertinent labs & imaging results that were available during my care of the patient were reviewed by me and considered in my medical decision making (see chart for details).    MDM Rules/Calculators/A&P                         Patient arrived as a code stroke.  Work-up in the emergency department as well as being seen by neurology.  Neurology felt patient symptoms are probably related to her dementia and her Parkinson's disease.  Patient was cleared for discharge home.  Patient originally came from [facility] patient's daughter is here.  She will transport her to her house this evening and then get her back to [facility] in the morning.  Patient's work-up included MRI and EEG.  EEG interpreted by neurology.  MRI without any acute findings.  Final Clinical Impression(s) / ED Diagnoses Final diagnoses:  Dementia without behavioral disturbance, unspecified dementia type ([place])  Parkinson disease ([place])    Rx / DC Orders ED Discharge Orders    None       Fredia Sorrow, MD 08/12/20 2045

## 2020-07-25 NOTE — ED Notes (Signed)
Discharge instructions reviewed with pt. Pt verbalized understanding.  Pt daughter at bedside and will be taking pt home.

## 2020-07-25 NOTE — ED Notes (Signed)
Pt is sleepy from ativan ... Rn will complete stroke swallow screen when she wakes up

## 2020-07-25 NOTE — ED Notes (Signed)
Dr. Zackowski at bedside  

## 2020-07-25 NOTE — Procedures (Signed)
Patient Name: CODI FOLKERTS  MRN: [id number]  Epilepsy Attending: Lora Havens  Referring Physician/Provider: Dr Fredia Sorrow Date: 07/25/2020 Duration:   Patient history: 84yo F presented with seizure like episode. EEG to evaluate for seizure  Level of alertness:  Awake, Asleep/sedated  AEDs during EEG study: Ativan  Technical aspects: This EEG study was done with scalp electrodes positioned according to the 10-20 International system of electrode placement. Electrical activity was acquired at a sampling rate of 500Hz  and reviewed with a high frequency filter of 70Hz  and a low frequency filter of 1Hz . EEG data were recorded continuously and digitally stored.   Description: No posterior dominant rhythm was seen. Sleep was characterized by vertex waves, sleep spindles (12 to 14 Hz), maximal frontocentral region. EEG showed intermittent generalized 2-3 Hz delta slowing as well as generalized 8-9Hz  alpha activity. Hyperventilation and photic stimulation were not performed.     ABNORMALITY -Intermittent slow, generalized  IMPRESSION: This study is suggestive of mild to moderate diffuse encephalopathy, nonspecific etiology.  No seizures or epileptiform discharges were seen throughout the recording.  Pansey Pinheiro Barbra Sarks

## 2020-07-25 NOTE — Consult Note (Addendum)
Neurology Consultation Reason for Consult: Code stroke, aphasia and left arm shaking  Referring Physician: Rogene Houston  CC: Left arm shaking   History is obtained from: Family and chart review  HPI: [name] is a 84 y.o. female with past medical history of dementia (likely Alzheimer's with parkinsonian features on DaTscan), hypertension, anxiety, depression, presenting from her assisted living facility with left-sided shaking, difficulty speaking.  Per the daughter and chart review, this tremor has been gradually progressive, and the patient has recently moved into [facility] independent living.  She has struggled with anxiety as well as hallucinations during a trial of Sinemet.  Her memory issues have been worsening, with emotional breakdowns when she forgets things.  Per the nursing team her arm shaking initially at the bridge was nonsuppressible.  There was a pill-rolling component intermittently.  On my initial evaluation she appeared to have a fixed right gaze deviation at the same time.  I did note the inconsistent localization (a right cortical seizure focus would be expected to cause left arm shaking and left gaze deviation), but given the severity of the rhythmic shaking movements and the possibility of multifocal seizures, chose to administer 2 mg of Ativan.  Gaze and shaking movements greatly improved with this treatment and we were able to obtain head CT ruling out an acute intracranial process.  LKW: 8 AM tPA given?: No, due to not a stroke  ROS: Unable to obtain due to altered mental status.   Past Medical History:  Diagnosis Date  . Allergic rhinitis 02/07/2008   Qualifier: Diagnosis of  By: [place], [place]    . Contact dermatitis 03/16/2013  . Generalized anxiety disorder 02/19/2013   has seen [name], [name] and now [name] for counseling  . Hemorrhoids, external   . Hot flashes   . Hyperlipemia 02/07/2008   Qualifier: Diagnosis of  By: Danny Lawless CMA,  [place]   Long standing problem. Pretreatment LDL 175 in 2006, 210 in 2010.  Had tried lipitor but question of rash after 3 days of use. Switched to zocor but reports having leg pain. Retried Lipitor - weakness.  Currently on no medications   . Hypertension   . Irritable bowel syndrome   . Major depressive disorder 03/18/2016  . Major neurocognitive disorder, possible Alzheimer's disease 01/27/2019  . Palpitations 02/19/2013   On-set of symptoms March '14. EKG with NSR     Family History  Problem Relation Age of Onset  . Dementia Mother   . Depression Mother 64  . Heart disease Father   . Cancer Sister        breast  . Heart disease Other   . Heart disease Other   . Heart disease Other   . Cancer Sister        breast  . Hypothyroidism Sister   . Asthma Sister   . Rheum arthritis Paternal Grandmother   . Rheum arthritis Daughter     Social History:  reports that she quit smoking about 57 years ago. Her smoking use included cigarettes. She has a 0.10 pack-year smoking history. She has never used smokeless tobacco. She reports previous alcohol use. She reports that she does not use drugs.   Exam: Current vital signs: BP 136/71   Pulse (!) 54   Resp 13   SpO2 97%  Vital signs in last 24 hours: Pulse Rate:  [48-56] 54 (08/14 2008) Resp:  [10-15] 13 (08/14 2008) BP: (91-144)/(59-100) 136/71 (08/14 2008) SpO2:  [90 %-97 %]  97 % (08/14 2008)   Physical Exam  Constitutional: Appears well-developed and well-nourished.  Psych: Affect appropriate to situation Eyes: No scleral injection HENT: No OP obstrucion MSK: no joint deformities.  Cardiovascular: Normal rate and regular rhythm.  Respiratory: Effort normal, non-labored breathing GI: Soft.  No distension. There is no tenderness.  Skin: WDI  Neuro: Mental Status: Patient is awake, alert, able to answer some simple questions such as her name and that she has had shaking of her arm for a long time; after Ativan she was quite  somnolent Cranial Nerves: II: Visual Fields are full. Pupils are equal, round, and reactive to light.  III,IV, VI: EOMI without ptosis or diploplia.  V: Facial sensation is symmetric to temperature VII: Facial movement is symmetric.  VIII: hearing is intact to voice X: Uvula elevates symmetrically XI: Shoulder shrug is symmetric. XII: tongue is midline without atrophy or fasciculations.  Motor: Tone is cogwheeling in the left upper extremity most notably. Bulk is diffusely reduced.  She is slightly weaker in the left upper extremity compared to the right  Sensory: Sensation is symmetric to light touch and temperature in the arms and legs. Deep Tendon Reflexes: 3+ and symmetric in the biceps and 2+ in the patellae.   I have reviewed labs in epic and the results pertinent to this consultation are: CBC reassuringly normal, BMP with some mild hypocalcemia and mild elevated glucose but otherwise normal  I have reviewed the images obtained: Head CT without any acute intracranial process MRI brain with and without any acute intracranial process EEG read by Dr. Hortense Ramal revealed no epileptiform activity  Impression: This is an 84 year old woman with dementia presenting with a spell of altered awareness and interactivity.  Given my initial concern for the possibility of seizure, which is not uncommon in patients with dementia, I obtained an MRI brain with and without contrast as well as an EEG to confirm that there is no epileptogenic activity.  Laboratory work-up is without clear inciting cause for delirium, and her vital signs are reassuring.  Given the negative work-up, the spell likely is just reflective of her underlying dementia.   Recommendations: -No further inpatient neurological work-up needed -Continued outpatient follow-up with Dr. Lysbeth Galas MD-PhD [facility] [phone]  Addended for charge capture

## 2020-07-27 NOTE — Telephone Encounter (Signed)
Pls call daughter Helene Kelp and let her know I reviewed notes from last week. Since her mom has been sensitive to medications, would wait another week on the higher dose of Rivastigmine before we start the Sinemet, if she is okay with that. Thanks

## 2020-07-27 NOTE — Telephone Encounter (Signed)
Pt daughter called no answer voice mail left for her to call back  

## 2020-07-27 NOTE — Telephone Encounter (Signed)
Pt daughter called informed that Dr Delice Lesch would like for pt to stay on the rivastigmine at a higher dose for a week before she starts the sinemet, pt daughter verbalized understanding, pt daughter wanted Dr Delice Lesch to know that pt went to the ER on Sat she was evaluated for stroke / seizure Pt daughter stated that pt is getting worse increase shaking, cant remember how to use the phone pt stated she had a fall but no evidence of a fall. At [facility] she had a CT, MRI and EEG all Neg. Blood work was good, pt was given ativan it calmed her tremors but knocked pt out. They where puzzled thinking it was seizure she stated that left side of body shaking but pt was looking rt?

## 2020-07-28 ENCOUNTER — Telehealth: Payer: Self-pay | Admitting: Neurology

## 2020-07-28 NOTE — Telephone Encounter (Signed)
The BM issue may relate to the diarrhea, hopefully this improves back on lower dose Rivastigmine. Once diarrhea better, pls have her call our office and we will plan to restart low dose Sinemet to see if this helps with the shaking. Continue f/u with Psychiatry for anxiety. Thanks

## 2020-07-28 NOTE — Telephone Encounter (Signed)
Patient's daughter called to report the patient recently increased her rivastigmine to 6 MG and is now having diarrhea this morning. She expressed concern this medication increase may be related.

## 2020-07-28 NOTE — Telephone Encounter (Signed)
Pt daughter called no answer left voice mail needs to be called back tomorrow to be informed that  The BM issue may relate to the diarrhea, hopefully this improves back on lower dose Rivastigmine. Once diarrhea better, pls have her call our office and we will plan to restart low dose Sinemet to see if this helps with the shaking. Continue f/u with Psychiatry for anxiety

## 2020-07-28 NOTE — Telephone Encounter (Signed)
Spoke with pt daughter informed her that we are going to reduce rivastigmine back to 3 mg BID, PT daughter informed that I had faxed information to pt facility she lived at, Pt daughter wanted Dr Delice Lesch to know that the pt has lost control of when she cont of BM's she doesn't know when she is going. Also wanted to let dr Delice Lesch know that pt has has increase in shaking and anxiety

## 2020-07-28 NOTE — Telephone Encounter (Signed)
Likely due to rivastigmine. Reduce back to 3mg  BID. Instructions will need to be sent to her facility. Thanks

## 2020-07-29 DIAGNOSIS — R260 Ataxic gait: Secondary | ICD-10-CM | POA: Diagnosis not present

## 2020-07-29 DIAGNOSIS — R41841 Cognitive communication deficit: Secondary | ICD-10-CM | POA: Diagnosis not present

## 2020-07-29 DIAGNOSIS — R2681 Unsteadiness on feet: Secondary | ICD-10-CM | POA: Diagnosis not present

## 2020-07-29 NOTE — Telephone Encounter (Signed)
Spoke with daughter and told her that Dr Delice Lesch ordered that dose of rivastigmine be decreased back to 3 mg as previously given, believes that higher dose probably the cause of diarrhea. Asked her to contact this office when diarrhea resolved and she will order the start of low dose Sinemet, she verbalized understanding. Also asked her to have pt f/u with psychiatry re anxiety which she agreed to do. She states pt will be transitioning from assisted living to LTC because of declining health.

## 2020-07-30 DIAGNOSIS — R2681 Unsteadiness on feet: Secondary | ICD-10-CM | POA: Diagnosis not present

## 2020-07-30 DIAGNOSIS — R260 Ataxic gait: Secondary | ICD-10-CM | POA: Diagnosis not present

## 2020-07-30 DIAGNOSIS — R41841 Cognitive communication deficit: Secondary | ICD-10-CM | POA: Diagnosis not present

## 2020-08-03 ENCOUNTER — Encounter: Payer: Self-pay | Admitting: Family Medicine

## 2020-08-03 ENCOUNTER — Other Ambulatory Visit: Payer: Self-pay

## 2020-08-03 ENCOUNTER — Ambulatory Visit (INDEPENDENT_AMBULATORY_CARE_PROVIDER_SITE_OTHER): Payer: Medicare Other | Admitting: Family Medicine

## 2020-08-03 VITALS — BP 112/58 | HR 56 | Ht 63.5 in | Wt 116.3 lb

## 2020-08-03 DIAGNOSIS — G4700 Insomnia, unspecified: Secondary | ICD-10-CM

## 2020-08-03 DIAGNOSIS — K5900 Constipation, unspecified: Secondary | ICD-10-CM | POA: Diagnosis not present

## 2020-08-03 DIAGNOSIS — R32 Unspecified urinary incontinence: Secondary | ICD-10-CM

## 2020-08-03 DIAGNOSIS — G3184 Mild cognitive impairment, so stated: Secondary | ICD-10-CM | POA: Diagnosis not present

## 2020-08-03 DIAGNOSIS — G20 Parkinson's disease: Secondary | ICD-10-CM | POA: Diagnosis not present

## 2020-08-03 DIAGNOSIS — I10 Essential (primary) hypertension: Secondary | ICD-10-CM

## 2020-08-03 DIAGNOSIS — G20A1 Parkinson's disease without dyskinesia, without mention of fluctuations: Secondary | ICD-10-CM

## 2020-08-03 LAB — POC URINALSYSI DIPSTICK (AUTOMATED)
Bilirubin, UA: NEGATIVE
Blood, UA: NEGATIVE
Glucose, UA: NEGATIVE
Ketones, UA: NEGATIVE
Leukocytes, UA: NEGATIVE
Nitrite, UA: NEGATIVE
Protein, UA: NEGATIVE
Spec Grav, UA: 1.025 (ref 1.010–1.025)
Urobilinogen, UA: 0.2 E.U./dL
pH, UA: 6.5 (ref 5.0–8.0)

## 2020-08-03 MED ORDER — METAMUCIL SMOOTH TEXTURE 58.6 % PO POWD
1.0000 | Freq: Every day | ORAL | 12 refills | Status: AC
Start: 2020-08-03 — End: ?

## 2020-08-03 MED ORDER — IPRATROPIUM BROMIDE 0.03 % NA SOLN: 2.0000 | Freq: Three times a day (TID) | NASAL | 12 refills | Status: DC | PRN

## 2020-08-03 NOTE — Progress Notes (Signed)
[name] DOB: [birth year] Encounter date: 08/03/2020  This is a 84 y.o. female who presents with Chief Complaint  Patient presents with   Hospitalization Follow-up    History of present illness: Patient was sent to the emergency room after she was noted to have left-sided shaking and difficulty speaking at her assisted living facility.  She has had some tremor, that is gradually been progressive.  [facility]  She has had progressive dementia and struggled with anxiety as well as hallucinations during her trial of Sinemet.  She was evaluated by neurology in the emergency room, and after MRI with and without contrast and EEG were obtained as well as normal lab work, it was thought that symptoms were a result of underlying dementia.  Patient was released.  Has been bumpy week. Still not clear what happened, but something happened. Daughter here with her. Armenta had fall and they were concerned she had stroke due to holding left side, drooping. Stroke and seizure ruled out and was cleared to go home. Ativan given in ER - daughter states it has taken her days to come out of the "ativan cloud". Days spend since out of hospital getting back to baseline. Has been hectic/difficult. Just out of sorts. Her cats have now gone to live with daughter (patient is at [facility]); they were getting out; Sherell had left door open.   Mornings have been rough. When [name] when to pick her up today she was on couch in robe and said she hadn't slept last night. Has nights that are very restless. Not sure if she is sleeping well and if she is getting rest. Sleep issues have been around since at least 2017. Seems like getting to be more of issue. Trouble with falling asleep and then trouble with getting up again in morning. Doesn't feel like she is getting enough rest. Morning anxiety is more significant. Had bad dreams with melatonin.   Adjusting to time at [place] - is  participating in some activities but per patient she has to force herself to do this. She has escorts for dinner. Backed off lunch escort. Worried about her not eating/drinking enough.   Med management through [facility].They are doing safety checks in morning and evening.   Has runny nose - sometimes with sneezing. Has benadryl, but wondering if something she can take on regular basis.   Bladder leakage is becoming more of issue. Happening during day more, but noting in day and at night. Not getting warning sensation like she was.   Daughter states that Kaylia has been going to exercise classes at least in the morning; sometimes twice daily.   Doing PT twice weekly as well as speech therapy.   Has had some issues with constipation/diarrhea. Diarrhea they think related to medication. Tends to be more constipated. Last week increased dose of rivastigmine was started 6mg  BID. Increased dose started Monday. Tuesday had diarrhea, and had breakdown due to this side effect; has decreased back to 3mg .   Allergies  Allergen Reactions   Aricept [Donepezil Hcl]     Hallucinations, confusion   Cephalexin     REACTION: diaphoretic and drop in Blood pressure   Lorazepam Other (See Comments)    amnesia   Melatonin    Sinemet [Carbidopa-Levodopa]     confusion   Current Meds  Medication Sig   Acetaminophen (TYLENOL PO) Take by mouth as needed.   CALCIUM CITRATE-VITAMIN D PO Take by mouth.   carbidopa-levodopa (SINEMET  IR) 25-100 MG tablet Take 0.5 tablets by mouth 3 (three) times daily.   dorzolamide-timolol (COSOPT) 22.3-6.8 MG/ML ophthalmic solution dorzolamide 22.3 mg-timolol 6.8 mg/mL eye drops   estradiol (ESTRACE) 0.5 MG tablet    Multiple Vitamin (MULTIVITAMIN) capsule Take 1 capsule by mouth daily.   OVER THE COUNTER MEDICATION Take 1 packet by mouth daily. Dissolve one packet in a glass of water each morning. Emergen-C   progesterone (PROMETRIUM) 100 MG capsule Take 100 mg  by mouth at bedtime.    rivastigmine (EXELON) 3 MG capsule Take 3 mg by mouth 2 (two) times daily.   sertraline (ZOLOFT) 50 MG tablet Take 1 tablet (50 mg total) by mouth daily.    Review of Systems  Constitutional: Negative for chills, fatigue and fever.  Respiratory: Negative for cough, chest tightness, shortness of breath and wheezing.   Cardiovascular: Negative for chest pain, palpitations and leg swelling.  Musculoskeletal: Negative for arthralgias.  Neurological: Positive for tremors. Negative for dizziness, light-headedness and headaches.  Psychiatric/Behavioral: Positive for agitation (in morning sometimes) and sleep disturbance. The patient is nervous/anxious.     Objective:  BP (!) 112/58 (BP Location: Left Arm, Patient Position: Sitting, Cuff Size: Normal)    Pulse (!) 56    Ht 5' 3.5" (1.613 m)    Wt 116 lb 4.8 oz (52.8 kg)    SpO2 98%    BMI 20.28 kg/m   Weight: 116 lb 4.8 oz (52.8 kg)   BP Readings from Last 3 Encounters:  08/03/20 (!) 112/58  07/25/20 136/71  06/04/20 (!) 144/85   Wt Readings from Last 3 Encounters:  08/03/20 116 lb 4.8 oz (52.8 kg)  06/04/20 114 lb (51.7 kg)  02/19/20 119 lb (54 kg)    Physical Exam Constitutional:      General: She is not in acute distress.    Appearance: She is well-developed.  Cardiovascular:     Rate and Rhythm: Normal rate and regular rhythm.     Heart sounds: Normal heart sounds. No murmur heard.  No friction rub.  Pulmonary:     Effort: Pulmonary effort is normal. No respiratory distress.     Breath sounds: Normal breath sounds. No wheezing or rales.  Musculoskeletal:     Right lower leg: No edema.     Left lower leg: No edema.  Neurological:     Mental Status: She is alert and oriented to person, place, and time.  Psychiatric:        Mood and Affect: Mood is anxious.        Behavior: Behavior normal.        Cognition and Memory: She exhibits impaired recent memory.     Assessment/Plan  1. Urinary  incontinence, unspecified type Will check to make sure no infection; noting increased incontinence. MRI stable from recent ER visit. - Urinalysis; Future - Urine Culture; Future - POCT Urinalysis Dipstick (Automated) - Urine Culture - Urinalysis  2. Mild cognitive impairment with memory loss Working with caregivers at assisted living facility.  Daughters are assisting with all they can in terms of making sure patient is getting medications, getting checked in on, going to meals regularly, participating in activities.  She is following with neurology.  We discussed limiting medications where possible and then even some medications although seemingly benign (like Benadryl) could contribute to some confusion and drowsiness and potentially increased fall risk.  She had not been eating well at home, and sometimes was forgetting to eat meals.  She has somebody  going with her to meals at least twice a day now.  We discussed adding in nutritional supplement drink earlier in the day, and then doing a higher protein/fat ice cream (like Magic cup) in the evening.  They will look for items at the grocery store today feel that this may be something she is capable of doing. Continue to monitor weight.  3. Essential hypertension Blood pressure on lower end of normal today.  Continue to monitor.  Currently continue medications.  4. Parkinson's disease [facility]) Following with neurology; they are working on med adjustment and follow up visit set up for October.  5. Insomnia, unspecified type Going to reach out to neurology regarding this.  I am worried about giving patient any sort of sleep medication, she will be having med adjustment with the Sinemet per neurology notes, so this may affect her to some degree.  We will need to monitor anxiety as well, since this plays a large roll for her in sleep.  6. Constipation -this is an ongoing problem for her.  We discussed using fiber supplement like Metamucil which may  help bulk up stool to help with days where she has some looser stool but also help with flow of bowels.  Return in about 3 months (around 11/03/2020) for Chronic condition visit.  Over 40 minutes spent reviewing chart, charting, time with patient and daughter discussing concerns from hospital as well as concerns with worsening memory.  We discussed different tips for increasing caloric intake during the day.  Reviewed importance of nutrition as relates to physical therapy and strengthening.  Micheline Rough, MD

## 2020-08-04 DIAGNOSIS — R41841 Cognitive communication deficit: Secondary | ICD-10-CM | POA: Diagnosis not present

## 2020-08-04 DIAGNOSIS — R2681 Unsteadiness on feet: Secondary | ICD-10-CM | POA: Diagnosis not present

## 2020-08-04 DIAGNOSIS — R260 Ataxic gait: Secondary | ICD-10-CM | POA: Diagnosis not present

## 2020-08-04 LAB — URINALYSIS
Bilirubin Urine: NEGATIVE
Glucose, UA: NEGATIVE
Hgb urine dipstick: NEGATIVE
Ketones, ur: NEGATIVE
Leukocytes,Ua: NEGATIVE
Nitrite: NEGATIVE
Protein, ur: NEGATIVE
Specific Gravity, Urine: 1.01 (ref 1.001–1.03)
pH: 6.5 (ref 5.0–8.0)

## 2020-08-04 LAB — URINE CULTURE
MICRO NUMBER:: 10859321
Result:: NO GROWTH
SPECIMEN QUALITY:: ADEQUATE

## 2020-08-05 DIAGNOSIS — R2681 Unsteadiness on feet: Secondary | ICD-10-CM | POA: Diagnosis not present

## 2020-08-05 DIAGNOSIS — R41841 Cognitive communication deficit: Secondary | ICD-10-CM | POA: Diagnosis not present

## 2020-08-05 DIAGNOSIS — R260 Ataxic gait: Secondary | ICD-10-CM | POA: Diagnosis not present

## 2020-08-06 DIAGNOSIS — R41841 Cognitive communication deficit: Secondary | ICD-10-CM | POA: Diagnosis not present

## 2020-08-06 DIAGNOSIS — R260 Ataxic gait: Secondary | ICD-10-CM | POA: Diagnosis not present

## 2020-08-06 DIAGNOSIS — R2681 Unsteadiness on feet: Secondary | ICD-10-CM | POA: Diagnosis not present

## 2020-08-08 NOTE — Progress Notes (Signed)
I messaged with Dr. Delice Lesch, and just wanted to let daughter/patient know that we are both hesitant to add med for sleep due to potential side effects. Dr. Delice Lesch suggested followup with psychiatry.  And thought that they may be the best to adjust/on/change medications to help with anxiety and sleep.  I was not aware that she was following with psychiatry on a regular basis?  If she is, I do agree that it would be good for her to touch base with them.  Unfortunately difficulty with sleep and medications for sleep are somewhat complicated due to potential side effects.  Let me know if she does not have a psychiatrist, and we can discuss some med adjustments I think may be reasonable.  If she has a psychiatrist, I would suggest follow-up with them.

## 2020-08-10 DIAGNOSIS — R41841 Cognitive communication deficit: Secondary | ICD-10-CM | POA: Diagnosis not present

## 2020-08-10 DIAGNOSIS — R2681 Unsteadiness on feet: Secondary | ICD-10-CM | POA: Diagnosis not present

## 2020-08-10 DIAGNOSIS — R260 Ataxic gait: Secondary | ICD-10-CM | POA: Diagnosis not present

## 2020-08-11 ENCOUNTER — Telehealth: Payer: Self-pay | Admitting: *Deleted

## 2020-08-11 ENCOUNTER — Ambulatory Visit (INDEPENDENT_AMBULATORY_CARE_PROVIDER_SITE_OTHER): Payer: Medicare Other | Admitting: Psychology

## 2020-08-11 DIAGNOSIS — F411 Generalized anxiety disorder: Secondary | ICD-10-CM | POA: Diagnosis not present

## 2020-08-11 DIAGNOSIS — R260 Ataxic gait: Secondary | ICD-10-CM | POA: Diagnosis not present

## 2020-08-11 DIAGNOSIS — R41841 Cognitive communication deficit: Secondary | ICD-10-CM | POA: Diagnosis not present

## 2020-08-11 DIAGNOSIS — R2681 Unsteadiness on feet: Secondary | ICD-10-CM | POA: Diagnosis not present

## 2020-08-11 NOTE — Telephone Encounter (Signed)
-----   Message from [name], MD sent at 08/08/2020  4:01 PM EDT -----   ----- Message ----- From: Cameron Sprang, MD Sent: 08/03/2020   3:05 PM EDT To: [name], MD  Thanks for the update. I'm a little hesitant with starting her on meds as well, with her significant anxiety I think her psychiatrist may be able to better help with sleep and maybe anxiety med adjustment as well?Santiago Glad ----- Message ----- From: [name], MD Sent: 08/03/2020   1:28 PM EDT To: Cameron Sprang, MD  Hi -just wanted to touch base with you after seeing Herbert Pun today. One concern brought up was sleep difficulty. Suspect multifactorial for her, but wanted to reach out to you and see if you had any suggestions for management or if potential further eval recommended. Some nights sounds like she really isn't sleeping well/much at all. Didn't tolerate melatonin and I'm reluctant to try anything else for her.

## 2020-08-11 NOTE — Telephone Encounter (Signed)
Spoke with the pts daughter [name] and informed her of the message below.  She stated the pt is seeing a therapist every 2 weeks and a psychiatrist gives medications and she agreed to contact their office.

## 2020-08-12 DIAGNOSIS — R260 Ataxic gait: Secondary | ICD-10-CM | POA: Diagnosis not present

## 2020-08-12 DIAGNOSIS — R41841 Cognitive communication deficit: Secondary | ICD-10-CM | POA: Diagnosis not present

## 2020-08-12 DIAGNOSIS — R2681 Unsteadiness on feet: Secondary | ICD-10-CM | POA: Diagnosis not present

## 2020-08-13 DIAGNOSIS — R260 Ataxic gait: Secondary | ICD-10-CM | POA: Diagnosis not present

## 2020-08-13 DIAGNOSIS — R41841 Cognitive communication deficit: Secondary | ICD-10-CM | POA: Diagnosis not present

## 2020-08-13 DIAGNOSIS — R2681 Unsteadiness on feet: Secondary | ICD-10-CM | POA: Diagnosis not present

## 2020-08-14 DIAGNOSIS — R41841 Cognitive communication deficit: Secondary | ICD-10-CM | POA: Diagnosis not present

## 2020-08-14 DIAGNOSIS — R260 Ataxic gait: Secondary | ICD-10-CM | POA: Diagnosis not present

## 2020-08-14 DIAGNOSIS — R2681 Unsteadiness on feet: Secondary | ICD-10-CM | POA: Diagnosis not present

## 2020-08-18 DIAGNOSIS — R2681 Unsteadiness on feet: Secondary | ICD-10-CM | POA: Diagnosis not present

## 2020-08-18 DIAGNOSIS — R41841 Cognitive communication deficit: Secondary | ICD-10-CM | POA: Diagnosis not present

## 2020-08-18 DIAGNOSIS — R260 Ataxic gait: Secondary | ICD-10-CM | POA: Diagnosis not present

## 2020-08-19 DIAGNOSIS — R41841 Cognitive communication deficit: Secondary | ICD-10-CM | POA: Diagnosis not present

## 2020-08-19 DIAGNOSIS — R2681 Unsteadiness on feet: Secondary | ICD-10-CM | POA: Diagnosis not present

## 2020-08-19 DIAGNOSIS — R260 Ataxic gait: Secondary | ICD-10-CM | POA: Diagnosis not present

## 2020-08-20 DIAGNOSIS — R260 Ataxic gait: Secondary | ICD-10-CM | POA: Diagnosis not present

## 2020-08-20 DIAGNOSIS — R2681 Unsteadiness on feet: Secondary | ICD-10-CM | POA: Diagnosis not present

## 2020-08-20 DIAGNOSIS — R41841 Cognitive communication deficit: Secondary | ICD-10-CM | POA: Diagnosis not present

## 2020-08-21 DIAGNOSIS — R260 Ataxic gait: Secondary | ICD-10-CM | POA: Diagnosis not present

## 2020-08-21 DIAGNOSIS — R41841 Cognitive communication deficit: Secondary | ICD-10-CM | POA: Diagnosis not present

## 2020-08-21 DIAGNOSIS — R2681 Unsteadiness on feet: Secondary | ICD-10-CM | POA: Diagnosis not present

## 2020-08-24 DIAGNOSIS — R2681 Unsteadiness on feet: Secondary | ICD-10-CM | POA: Diagnosis not present

## 2020-08-24 DIAGNOSIS — R260 Ataxic gait: Secondary | ICD-10-CM | POA: Diagnosis not present

## 2020-08-24 DIAGNOSIS — H401233 Low-tension glaucoma, bilateral, severe stage: Secondary | ICD-10-CM | POA: Diagnosis not present

## 2020-08-24 DIAGNOSIS — R41841 Cognitive communication deficit: Secondary | ICD-10-CM | POA: Diagnosis not present

## 2020-08-25 DIAGNOSIS — R260 Ataxic gait: Secondary | ICD-10-CM | POA: Diagnosis not present

## 2020-08-25 DIAGNOSIS — R41841 Cognitive communication deficit: Secondary | ICD-10-CM | POA: Diagnosis not present

## 2020-08-25 DIAGNOSIS — R2681 Unsteadiness on feet: Secondary | ICD-10-CM | POA: Diagnosis not present

## 2020-08-26 DIAGNOSIS — R2681 Unsteadiness on feet: Secondary | ICD-10-CM | POA: Diagnosis not present

## 2020-08-26 DIAGNOSIS — R41841 Cognitive communication deficit: Secondary | ICD-10-CM | POA: Diagnosis not present

## 2020-08-26 DIAGNOSIS — R260 Ataxic gait: Secondary | ICD-10-CM | POA: Diagnosis not present

## 2020-08-27 ENCOUNTER — Ambulatory Visit (INDEPENDENT_AMBULATORY_CARE_PROVIDER_SITE_OTHER): Payer: Medicare Other | Admitting: Psychology

## 2020-08-27 DIAGNOSIS — F411 Generalized anxiety disorder: Secondary | ICD-10-CM

## 2020-08-27 DIAGNOSIS — R41841 Cognitive communication deficit: Secondary | ICD-10-CM | POA: Diagnosis not present

## 2020-08-27 DIAGNOSIS — R260 Ataxic gait: Secondary | ICD-10-CM | POA: Diagnosis not present

## 2020-08-27 DIAGNOSIS — R2681 Unsteadiness on feet: Secondary | ICD-10-CM | POA: Diagnosis not present

## 2020-08-28 DIAGNOSIS — R41841 Cognitive communication deficit: Secondary | ICD-10-CM | POA: Diagnosis not present

## 2020-08-28 DIAGNOSIS — R2681 Unsteadiness on feet: Secondary | ICD-10-CM | POA: Diagnosis not present

## 2020-08-28 DIAGNOSIS — R260 Ataxic gait: Secondary | ICD-10-CM | POA: Diagnosis not present

## 2020-09-01 DIAGNOSIS — R41841 Cognitive communication deficit: Secondary | ICD-10-CM | POA: Diagnosis not present

## 2020-09-01 DIAGNOSIS — R260 Ataxic gait: Secondary | ICD-10-CM | POA: Diagnosis not present

## 2020-09-01 DIAGNOSIS — R2681 Unsteadiness on feet: Secondary | ICD-10-CM | POA: Diagnosis not present

## 2020-09-02 DIAGNOSIS — R260 Ataxic gait: Secondary | ICD-10-CM | POA: Diagnosis not present

## 2020-09-02 DIAGNOSIS — R2681 Unsteadiness on feet: Secondary | ICD-10-CM | POA: Diagnosis not present

## 2020-09-02 DIAGNOSIS — R41841 Cognitive communication deficit: Secondary | ICD-10-CM | POA: Diagnosis not present

## 2020-09-03 DIAGNOSIS — R2681 Unsteadiness on feet: Secondary | ICD-10-CM | POA: Diagnosis not present

## 2020-09-03 DIAGNOSIS — R41841 Cognitive communication deficit: Secondary | ICD-10-CM | POA: Diagnosis not present

## 2020-09-03 DIAGNOSIS — R260 Ataxic gait: Secondary | ICD-10-CM | POA: Diagnosis not present

## 2020-09-07 DIAGNOSIS — R2681 Unsteadiness on feet: Secondary | ICD-10-CM | POA: Diagnosis not present

## 2020-09-07 DIAGNOSIS — R41841 Cognitive communication deficit: Secondary | ICD-10-CM | POA: Diagnosis not present

## 2020-09-07 DIAGNOSIS — R260 Ataxic gait: Secondary | ICD-10-CM | POA: Diagnosis not present

## 2020-09-08 ENCOUNTER — Ambulatory Visit (INDEPENDENT_AMBULATORY_CARE_PROVIDER_SITE_OTHER): Payer: Medicare Other | Admitting: Psychology

## 2020-09-08 DIAGNOSIS — R41841 Cognitive communication deficit: Secondary | ICD-10-CM | POA: Diagnosis not present

## 2020-09-08 DIAGNOSIS — Z23 Encounter for immunization: Secondary | ICD-10-CM | POA: Diagnosis not present

## 2020-09-08 DIAGNOSIS — F411 Generalized anxiety disorder: Secondary | ICD-10-CM

## 2020-09-08 DIAGNOSIS — R260 Ataxic gait: Secondary | ICD-10-CM | POA: Diagnosis not present

## 2020-09-08 DIAGNOSIS — R2681 Unsteadiness on feet: Secondary | ICD-10-CM | POA: Diagnosis not present

## 2020-09-09 DIAGNOSIS — R41841 Cognitive communication deficit: Secondary | ICD-10-CM | POA: Diagnosis not present

## 2020-09-09 DIAGNOSIS — R2681 Unsteadiness on feet: Secondary | ICD-10-CM | POA: Diagnosis not present

## 2020-09-09 DIAGNOSIS — R260 Ataxic gait: Secondary | ICD-10-CM | POA: Diagnosis not present

## 2020-09-10 DIAGNOSIS — R260 Ataxic gait: Secondary | ICD-10-CM | POA: Diagnosis not present

## 2020-09-10 DIAGNOSIS — R2681 Unsteadiness on feet: Secondary | ICD-10-CM | POA: Diagnosis not present

## 2020-09-10 DIAGNOSIS — R41841 Cognitive communication deficit: Secondary | ICD-10-CM | POA: Diagnosis not present

## 2020-09-14 ENCOUNTER — Telehealth: Payer: Self-pay | Admitting: Neurology

## 2020-09-14 NOTE — Telephone Encounter (Signed)
Patient's daughter called wanting to know if Dr. Delice Lesch had received the forms for Long-term care insurance.

## 2020-09-14 NOTE — Telephone Encounter (Signed)
Please call back on Tuesday, thanks

## 2020-09-15 DIAGNOSIS — R41841 Cognitive communication deficit: Secondary | ICD-10-CM | POA: Diagnosis not present

## 2020-09-15 DIAGNOSIS — R488 Other symbolic dysfunctions: Secondary | ICD-10-CM | POA: Diagnosis not present

## 2020-09-15 DIAGNOSIS — G20 Parkinson's disease: Secondary | ICD-10-CM | POA: Diagnosis not present

## 2020-09-15 DIAGNOSIS — R278 Other lack of coordination: Secondary | ICD-10-CM | POA: Diagnosis not present

## 2020-09-16 NOTE — Telephone Encounter (Signed)
I dont have any forms

## 2020-09-16 NOTE — Telephone Encounter (Signed)
Spoke to pt daughter she said it was mailed out sept 27th, Pt daughter informed that we do have they paperwork yet, she was given the fax number is going to give the insurance comp so they can fax it to [place],

## 2020-09-18 DIAGNOSIS — R278 Other lack of coordination: Secondary | ICD-10-CM | POA: Diagnosis not present

## 2020-09-18 DIAGNOSIS — G20 Parkinson's disease: Secondary | ICD-10-CM | POA: Diagnosis not present

## 2020-09-18 DIAGNOSIS — R488 Other symbolic dysfunctions: Secondary | ICD-10-CM | POA: Diagnosis not present

## 2020-09-18 DIAGNOSIS — R41841 Cognitive communication deficit: Secondary | ICD-10-CM | POA: Diagnosis not present

## 2020-09-21 ENCOUNTER — Encounter: Payer: Self-pay | Admitting: Family Medicine

## 2020-09-22 ENCOUNTER — Encounter: Payer: Self-pay | Admitting: Family Medicine

## 2020-09-22 DIAGNOSIS — R41841 Cognitive communication deficit: Secondary | ICD-10-CM | POA: Diagnosis not present

## 2020-09-22 DIAGNOSIS — R488 Other symbolic dysfunctions: Secondary | ICD-10-CM | POA: Diagnosis not present

## 2020-09-22 DIAGNOSIS — R278 Other lack of coordination: Secondary | ICD-10-CM | POA: Diagnosis not present

## 2020-09-22 DIAGNOSIS — G20 Parkinson's disease: Secondary | ICD-10-CM | POA: Diagnosis not present

## 2020-09-24 DIAGNOSIS — R488 Other symbolic dysfunctions: Secondary | ICD-10-CM | POA: Diagnosis not present

## 2020-09-24 DIAGNOSIS — R278 Other lack of coordination: Secondary | ICD-10-CM | POA: Diagnosis not present

## 2020-09-24 DIAGNOSIS — G20 Parkinson's disease: Secondary | ICD-10-CM | POA: Diagnosis not present

## 2020-09-24 DIAGNOSIS — R41841 Cognitive communication deficit: Secondary | ICD-10-CM | POA: Diagnosis not present

## 2020-09-29 DIAGNOSIS — R41841 Cognitive communication deficit: Secondary | ICD-10-CM | POA: Diagnosis not present

## 2020-09-29 DIAGNOSIS — G20 Parkinson's disease: Secondary | ICD-10-CM | POA: Diagnosis not present

## 2020-09-29 DIAGNOSIS — R488 Other symbolic dysfunctions: Secondary | ICD-10-CM | POA: Diagnosis not present

## 2020-09-29 DIAGNOSIS — R278 Other lack of coordination: Secondary | ICD-10-CM | POA: Diagnosis not present

## 2020-09-30 ENCOUNTER — Telehealth: Payer: Self-pay | Admitting: Family Medicine

## 2020-09-30 ENCOUNTER — Encounter: Payer: Self-pay | Admitting: Family Medicine

## 2020-09-30 NOTE — Telephone Encounter (Signed)
I wrote note in mychart; have her check it and let me know if she feels more is needed.

## 2020-09-30 NOTE — Telephone Encounter (Signed)
Pt brought an envelope with information for Dr. Ethlyn Gallery. Placed in provider's folder.

## 2020-10-01 ENCOUNTER — Encounter: Payer: Self-pay | Admitting: Neurology

## 2020-10-01 ENCOUNTER — Encounter: Payer: Self-pay | Admitting: Family Medicine

## 2020-10-01 NOTE — Telephone Encounter (Signed)
Note states the Mychart message was read on 10/19 at 5:21pm.

## 2020-10-05 ENCOUNTER — Other Ambulatory Visit: Payer: Self-pay

## 2020-10-05 ENCOUNTER — Ambulatory Visit (INDEPENDENT_AMBULATORY_CARE_PROVIDER_SITE_OTHER): Payer: Medicare Other | Admitting: Neurology

## 2020-10-05 ENCOUNTER — Encounter: Payer: Self-pay | Admitting: Neurology

## 2020-10-05 VITALS — BP 149/76 | HR 53 | Ht 63.5 in | Wt 118.6 lb

## 2020-10-05 DIAGNOSIS — F0281 Dementia in other diseases classified elsewhere with behavioral disturbance: Secondary | ICD-10-CM

## 2020-10-05 DIAGNOSIS — G301 Alzheimer's disease with late onset: Secondary | ICD-10-CM

## 2020-10-05 DIAGNOSIS — G20 Parkinson's disease: Secondary | ICD-10-CM

## 2020-10-05 MED ORDER — CARBIDOPA-LEVODOPA 25-100 MG PO TABS: 1.0000 | ORAL_TABLET | Freq: Three times a day (TID) | ORAL | 11 refills | Status: DC

## 2020-10-05 NOTE — Patient Instructions (Signed)
1. Increase carbidopa/levodopa 25/100mg : Take 1 tablet three times a day with meals  2. Call our office for any update in 2 months, we can plan to restart Rivastigmine 3mg  twice a day at that point  3. Continue PT, speech therapy, occupational therapy  4. Schedule repeat Neurocognitive testing for March 2022  5. Follow-up in 4 months, call for any changes

## 2020-10-05 NOTE — Progress Notes (Signed)
NEUROLOGY FOLLOW UP OFFICE NOTE  [name] [id number] 1935-12-30  HISTORY OF PRESENT ILLNESS: I had the pleasure of seeing [name] in follow-up in the neurology clinic on 10/05/2020.  The patient was last seen 4 months ago for dementia with behavioral disturbance and tremors. She is again accompanied by her daughter [name] who helps supplement the history today. Since her last visit, she had a DATscan showing asymmetric decreased radiotracer activity within the right striatum, near absent activity in the right putamen and reduced activity in the head of the right caudate nucleus. In the past, she had more hallucinations with Sinemet. She had diarrhea on higher dose of Rivastigmine 6mg  BID. Instructions were given to reduce Rivastigmine to 3mg  BID and then start Sinemet later on. [name] reports that Rivastigmine was not restarted, instead Sinemet 25/100mg  1/2 tab TID was started last month. Her daughters feel that since starting Sinemet, she has been more clear-minded, sometimes recalling names of residents at [facility]. The left hand tremor has lessened some. They feel her mood had improved as well, previously she would be waking up daily "in the depths of despair," now this only occurs once a week. She reports that she woke up this morning for the first time feeling depressed and needing to force herself to get out of bed. She has not been reporting hallucinations recently. Sleep is okay. Appetite is good. She is able to bathe and dress herself. She has physical therapy and speech therapy which have helped a lot. She had seen her eye doctor and was told she has loss of peripheral vision, she walked into a counter on her left side and walked on to a planter in front of her. They deny any falls.    History on Initial Assessment 12/18/2017: This is a pleasant 84 yo RH woman with a history of hypertension, anxiety, depression, who presented for evaluation of memory loss. She feels her memory is "a  little shaky." She endorses a lot of anxiety, and states it is causing her not to recall things and she does not understand it. Family started noticing memory changes around 2 years ago. She has been the main caregiver for her husband with dementia, who had been in and out of the hospital several times. She was "not dealing with things as well as I should." For instance, 2 years ago she was taking care of the managing their family home, but they noticed she was not keeping up with it, and turned it over to her sister. Her daughter took over finances in the Spring 2018 because she was afraid she would forget and she had never done the taxes in the past. She lives alone and denies missing medications. She denies getting lost driving. She has 3 cats at home that are cared for, she does not forget to feed them. She has no difficulties running the household, doing laundry and yardwork. She has learned how to use the leafblower. Their main concern is there anxiety and depression have "really ramped up." She is so stressed and scattered, that she could not stay on task. Family started noticing anxiety around 4-5 years ago, but in January 2017 she was "in breakdown territory" and started Lexapro and counseling. It appears she was on a very low dose due to concern for side effects. She would wake up every morning between 3-4 AM with a panic attack, unable to reason with herself, shaky. Family reports that she would forget what time they told her she would  be picked up, even if it was in a text message in front of her. They have noticed lack of focus and concentration. She would ask the same question about this doctor's appointment and was quite anxious about today's visit. Family reports she "tends to catastrophize things." No paranoia or hallucinations. She was switched from Lexapro to Prozac at the family's request, and "something about Prozac bothers me all in my head."   She denies any headaches, vertigo, diplopia,  dysarthria/dysphagia, neck/back pain, focal numbness/tingling/weakness, bowel/bladder dysfunction. She reports a "mental dizziness," where she states "my brain just feels full." She has tremors, R>L. She had lost her sense of smell after sinus surgery many years ago. Her mother had dementia in her 63s, her older sister has memory issues. No history of significant head injuries, no alcohol use.     PAST MEDICAL HISTORY: Past Medical History:  Diagnosis Date  . Allergic rhinitis 02/07/2008   Qualifier: Diagnosis of  By: [place], [place]    . Contact dermatitis 03/16/2013  . Generalized anxiety disorder 02/19/2013   has seen [name], [name] and now [name] for counseling  . Hemorrhoids, external   . Hot flashes   . Hyperlipemia 02/07/2008   Qualifier: Diagnosis of  By: Danny Lawless CMA, [place]   Long standing problem. Pretreatment LDL 175 in 2006, 210 in 2010.  Had tried lipitor but question of rash after 3 days of use. Switched to zocor but reports having leg pain. Retried Lipitor - weakness.  Currently on no medications   . Hypertension   . Irritable bowel syndrome   . Major depressive disorder 03/18/2016  . Major neurocognitive disorder, possible Alzheimer's disease 01/27/2019  . Palpitations 02/19/2013   On-set of symptoms March '14. EKG with NSR     MEDICATIONS: Current Outpatient Medications on File Prior to Visit  Medication Sig Dispense Refill  . Acetaminophen (TYLENOL PO) Take by mouth as needed.    [name] CALCIUM CITRATE-VITAMIN D PO Take by mouth.    . carbidopa-levodopa (SINEMET IR) 25-100 MG tablet Take 0.5 tablets by mouth 3 (three) times daily. 45 tablet 0  . dorzolamide-timolol (COSOPT) 22.3-6.8 MG/ML ophthalmic solution dorzolamide 22.3 mg-timolol 6.8 mg/mL eye drops    . estradiol (ESTRACE) 0.5 MG tablet     . Multiple Vitamin (MULTIVITAMIN) capsule Take 1 capsule by mouth daily.    [name] OVER THE COUNTER MEDICATION Take 1 packet by mouth daily. Dissolve one packet in a glass  of water each morning. Emergen-C    . progesterone (PROMETRIUM) 100 MG capsule Take 100 mg by mouth at bedtime.     . psyllium (METAMUCIL SMOOTH TEXTURE) 58.6 % powder Take 1 packet by mouth daily. 283 g 12  . rivastigmine (EXELON) 3 MG capsule Take 3 mg by mouth 2 (two) times daily.    . sertraline (ZOLOFT) 50 MG tablet Take 1 tablet (50 mg total) by mouth daily. 90 tablet 1   No current facility-administered medications on file prior to visit.    ALLERGIES: Allergies  Allergen Reactions  . Aricept [Donepezil Hcl]     Hallucinations, confusion  . Cephalexin     REACTION: diaphoretic and drop in Blood pressure  . Lorazepam Other (See Comments)    amnesia  . Melatonin   . Sinemet [Carbidopa-Levodopa]     confusion    FAMILY HISTORY: Family History  Problem Relation Age of Onset  . Dementia Mother   . Depression Mother 19  . Heart disease Father   .  Cancer Sister        breast  . Heart disease Other   . Heart disease Other   . Heart disease Other   . Cancer Sister        breast  . Hypothyroidism Sister   . Asthma Sister   . Rheum arthritis Paternal Grandmother   . Rheum arthritis Daughter     SOCIAL HISTORY: Social History   Socioeconomic History  . Marital status: Widowed    Spouse name: Not on file  . Number of children: 4  . Years of education: 16  . Highest education level: Bachelor's degree (e.g., BA, AB, BS)  Occupational History  . Occupation: Freight forwarder at [employer]    Comment: Retired  Tobacco Use  . Smoking status: Former Smoker    Packs/day: 0.10    Years: 1.00    Pack years: 0.10    Types: Cigarettes    Quit date: 12/12/1962    Years since quitting: 57.8  . Smokeless tobacco: Never Used  . Tobacco comment: smoked for 3 months in college   Vaping Use  . Vaping Use: Never used  Substance and Sexual Activity  . Alcohol use: Not Currently    Comment: rarely  . Drug use: No  . Sexual activity: Not Currently    Partners: Male  Other  Topics Concern  . Not on file  Social History Narrative   College grad. Married '61. 4 daughters, 2 grandchildren. 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   Advanced Directives: Has Living Will, HCPOA: [place] [phone]         Pt lives in 2 story home - her husband currently resides in nursing facility      01/22/19: Pt. Lives in [place] home, daughters check in on patient, managing medications   Pt. Still drives, manages yardwork she says   Enjoys swim aerobics at the [facility]   Right handed   Social Determinants of Health   Financial Resource Strain:   . Difficulty of Paying Living Expenses: Not on file  Food Insecurity:   . Worried About Charity fundraiser in the Last Year: Not on file  . Ran Out of Food in the Last Year: Not on file  Transportation Needs:   . Lack of Transportation (Medical): Not on file  . Lack of Transportation (Non-Medical): Not on file  Physical Activity:   . Days of Exercise per Week: Not on file  . Minutes of Exercise per Session: Not on file  Stress:   . Feeling of Stress : Not on file  Social Connections:   . Frequency of Communication with Friends and Family: Not on file  . Frequency of Social Gatherings with Friends and Family: Not on file  . Attends Religious Services: Not on file  . Active Member of Clubs or Organizations: Not on file  . Attends Archivist Meetings: Not on file  . Marital Status: Not on file  Intimate Partner Violence:   . Fear of Current or Ex-Partner: Not on file  . Emotionally Abused: Not on file  . Physically Abused: Not on file  . Sexually Abused: Not on file     PHYSICAL EXAM: Vitals:   10/05/20 1402  BP: (!) 149/76  Pulse: (!) 53  SpO2: 99%   General: No acute distress, hypomimia Head:  Normocephalic/atraumatic Skin/Extremities: No rash, no edema Neurological Exam: alert and awake. No aphasia or dysarthria. Fund of knowledge is reduced.  Recent and remote memory are  impaired.   Attention and concentration are normal.   Cranial nerves: Pupils equal, round. Extraocular movements intact with no nystagmus. Visual fields full.  No facial asymmetry.  Motor: mild cogwheeling with distraction on left, muscle strength 5/5 throughout with no pronator drift.   Finger to nose testing intact.  Gait narrow-based and steady with pill rolling tremor on left. Romberg negative. Decreased finger and foot taps on left. No postural instability. +resting tremor on left hand. No postural or action tremor   IMPRESSION: This is an 84 yo RH woman with a history of hypertension, anxiety, depression, dementia. Neuropsychological testing in February 2021 was stable compared to a year ago, with diagnosis of Major Neurocognitive disorder (dementia) likely due to Alzheimer's disease. She has had a left hand tremor, DATscan showed reduced uptake in the right striatum, in a pattern seen in Parkinsonian syndromes, consideration for Lewy body dementia. She has had improvement in symptoms with low dose Sinemet, increase Sinemet 25/100mg  to 1 tab TID. She has inadvertently stopped the Rivastigmine, if no issues with Sinemet in 2 months, family will call for an update and we will plan to restart Rivastigmine at that time. Follow-up Neuropsychological testing will be ordered for March 2022. Continue physical therapy, occupational therapy, and speech therapy. Continue follow-up with Behavioral Health. Follow-up in 4 months, family knows to call for any changes.  Thank you for allowing me to participate in her care.  Please do not hesitate to call for any questions or concerns.   Ellouise Newer, M.D.   CC: Dr. Ethlyn Gallery

## 2020-10-06 ENCOUNTER — Ambulatory Visit (INDEPENDENT_AMBULATORY_CARE_PROVIDER_SITE_OTHER): Payer: Medicare Other | Admitting: Psychology

## 2020-10-06 DIAGNOSIS — F411 Generalized anxiety disorder: Secondary | ICD-10-CM | POA: Diagnosis not present

## 2020-10-08 DIAGNOSIS — G20 Parkinson's disease: Secondary | ICD-10-CM | POA: Diagnosis not present

## 2020-10-08 DIAGNOSIS — R488 Other symbolic dysfunctions: Secondary | ICD-10-CM | POA: Diagnosis not present

## 2020-10-08 DIAGNOSIS — R278 Other lack of coordination: Secondary | ICD-10-CM | POA: Diagnosis not present

## 2020-10-08 DIAGNOSIS — R41841 Cognitive communication deficit: Secondary | ICD-10-CM | POA: Diagnosis not present

## 2020-10-09 DIAGNOSIS — G20 Parkinson's disease: Secondary | ICD-10-CM | POA: Diagnosis not present

## 2020-10-09 DIAGNOSIS — R488 Other symbolic dysfunctions: Secondary | ICD-10-CM | POA: Diagnosis not present

## 2020-10-09 DIAGNOSIS — R41841 Cognitive communication deficit: Secondary | ICD-10-CM | POA: Diagnosis not present

## 2020-10-09 DIAGNOSIS — R278 Other lack of coordination: Secondary | ICD-10-CM | POA: Diagnosis not present

## 2020-10-15 DIAGNOSIS — G20 Parkinson's disease: Secondary | ICD-10-CM | POA: Diagnosis not present

## 2020-10-15 DIAGNOSIS — R41841 Cognitive communication deficit: Secondary | ICD-10-CM | POA: Diagnosis not present

## 2020-10-15 DIAGNOSIS — R278 Other lack of coordination: Secondary | ICD-10-CM | POA: Diagnosis not present

## 2020-10-15 DIAGNOSIS — R488 Other symbolic dysfunctions: Secondary | ICD-10-CM | POA: Diagnosis not present

## 2020-10-20 DIAGNOSIS — R278 Other lack of coordination: Secondary | ICD-10-CM | POA: Diagnosis not present

## 2020-10-20 DIAGNOSIS — G20 Parkinson's disease: Secondary | ICD-10-CM | POA: Diagnosis not present

## 2020-10-20 DIAGNOSIS — R488 Other symbolic dysfunctions: Secondary | ICD-10-CM | POA: Diagnosis not present

## 2020-10-20 DIAGNOSIS — R41841 Cognitive communication deficit: Secondary | ICD-10-CM | POA: Diagnosis not present

## 2020-10-22 ENCOUNTER — Telehealth: Payer: Self-pay | Admitting: Family Medicine

## 2020-10-22 NOTE — Telephone Encounter (Signed)
Left message for patient to call back and schedule Medicare Annual Wellness Visit (AWV) either virtually or in office.  Last AWV 01/22/2019  Please schedule at any time with [facility] with the Nurse Health Advisor 1.  45 minute appointment

## 2020-10-23 DIAGNOSIS — G20 Parkinson's disease: Secondary | ICD-10-CM | POA: Diagnosis not present

## 2020-10-23 DIAGNOSIS — R278 Other lack of coordination: Secondary | ICD-10-CM | POA: Diagnosis not present

## 2020-10-23 DIAGNOSIS — R41841 Cognitive communication deficit: Secondary | ICD-10-CM | POA: Diagnosis not present

## 2020-10-23 DIAGNOSIS — R488 Other symbolic dysfunctions: Secondary | ICD-10-CM | POA: Diagnosis not present

## 2020-10-26 DIAGNOSIS — R41841 Cognitive communication deficit: Secondary | ICD-10-CM | POA: Diagnosis not present

## 2020-10-26 DIAGNOSIS — R488 Other symbolic dysfunctions: Secondary | ICD-10-CM | POA: Diagnosis not present

## 2020-10-26 DIAGNOSIS — G20 Parkinson's disease: Secondary | ICD-10-CM | POA: Diagnosis not present

## 2020-10-26 DIAGNOSIS — R278 Other lack of coordination: Secondary | ICD-10-CM | POA: Diagnosis not present

## 2020-10-28 ENCOUNTER — Ambulatory Visit (INDEPENDENT_AMBULATORY_CARE_PROVIDER_SITE_OTHER): Payer: Medicare Other | Admitting: Psychology

## 2020-10-28 DIAGNOSIS — F411 Generalized anxiety disorder: Secondary | ICD-10-CM

## 2020-11-02 DIAGNOSIS — R41841 Cognitive communication deficit: Secondary | ICD-10-CM | POA: Diagnosis not present

## 2020-11-02 DIAGNOSIS — R278 Other lack of coordination: Secondary | ICD-10-CM | POA: Diagnosis not present

## 2020-11-02 DIAGNOSIS — G20 Parkinson's disease: Secondary | ICD-10-CM | POA: Diagnosis not present

## 2020-11-02 DIAGNOSIS — R488 Other symbolic dysfunctions: Secondary | ICD-10-CM | POA: Diagnosis not present

## 2020-11-09 ENCOUNTER — Ambulatory Visit (INDEPENDENT_AMBULATORY_CARE_PROVIDER_SITE_OTHER): Payer: Medicare Other | Admitting: Family Medicine

## 2020-11-09 ENCOUNTER — Encounter: Payer: Self-pay | Admitting: Family Medicine

## 2020-11-09 ENCOUNTER — Ambulatory Visit (INDEPENDENT_AMBULATORY_CARE_PROVIDER_SITE_OTHER): Payer: Medicare Other

## 2020-11-09 ENCOUNTER — Other Ambulatory Visit: Payer: Self-pay

## 2020-11-09 VITALS — BP 130/70 | HR 63 | Temp 97.6°F | Wt 117.1 lb

## 2020-11-09 VITALS — BP 130/70 | HR 63 | Temp 97.6°F | Ht 64.0 in | Wt 117.1 lb

## 2020-11-09 DIAGNOSIS — F330 Major depressive disorder, recurrent, mild: Secondary | ICD-10-CM | POA: Diagnosis not present

## 2020-11-09 DIAGNOSIS — E785 Hyperlipidemia, unspecified: Secondary | ICD-10-CM | POA: Diagnosis not present

## 2020-11-09 DIAGNOSIS — I10 Essential (primary) hypertension: Secondary | ICD-10-CM

## 2020-11-09 DIAGNOSIS — Z0000 Encounter for general adult medical examination without abnormal findings: Secondary | ICD-10-CM | POA: Diagnosis not present

## 2020-11-09 DIAGNOSIS — F0390 Unspecified dementia without behavioral disturbance: Secondary | ICD-10-CM

## 2020-11-09 DIAGNOSIS — F411 Generalized anxiety disorder: Secondary | ICD-10-CM | POA: Diagnosis not present

## 2020-11-09 NOTE — Patient Instructions (Signed)
Check with physical therapist about finding pathway/path around building to walk on (may need cane outside).   I will touch base with neurology regarding sleep/dreams. Either I or neurology will be in touch with you about this.

## 2020-11-09 NOTE — Patient Instructions (Signed)
[name] , Thank you for taking time to come for your Medicare Wellness Visit. I appreciate your ongoing commitment to your health goals. Please review the following plan we discussed and let me know if I can assist you in the future.   Screening recommendations/referrals: Colonoscopy: No longer required  Mammogram: Up to date,next due 01/07/2021 Bone Density: No longer required  Recommended yearly ophthalmology/optometry visit for glaucoma screening and checkup Recommended yearly dental visit for hygiene and checkup  Vaccinations: Influenza vaccine: Up to date, next due fall 2022  Pneumococcal vaccine: Completed series  Tdap vaccine: Up to date, next due 10/31/2024 Shingles vaccine: Currently due for Shingrix if you wish to receive we recommend that you do so at your local pharmacy as it less expensive     Advanced directives: Copies on file   Conditions/risks identified: None   Next appointment: None    Preventive Care 82 Years and Older, Female Preventive care refers to lifestyle choices and visits with your health care provider that can promote health and wellness. What does preventive care include?  A yearly physical exam. This is also called an annual well check.  Dental exams once or twice a year.  Routine eye exams. Ask your health care provider how often you should have your eyes checked.  Personal lifestyle choices, including:  Daily care of your teeth and gums.  Regular physical activity.  Eating a healthy diet.  Avoiding tobacco and drug use.  Limiting alcohol use.  Practicing safe sex.  Taking low-dose aspirin every day.  Taking vitamin and mineral supplements as recommended by your health care provider. What happens during an annual well check? The services and screenings done by your health care provider during your annual well check will depend on your age, overall health, lifestyle risk factors, and family history of disease. Counseling  Your  health care provider may ask you questions about your:  Alcohol use.  Tobacco use.  Drug use.  Emotional well-being.  Home and relationship well-being.  Sexual activity.  Eating habits.  History of falls.  Memory and ability to understand (cognition).  Work and work Statistician.  Reproductive health. Screening  You may have the following tests or measurements:  Height, weight, and BMI.  Blood pressure.  Lipid and cholesterol levels. These may be checked every 5 years, or more frequently if you are over 69 years old.  Skin check.  Lung cancer screening. You may have this screening every year starting at age 44 if you have a 30-pack-year history of smoking and currently smoke or have quit within the past 15 years.  Fecal occult blood test (FOBT) of the stool. You may have this test every year starting at age 70.  Flexible sigmoidoscopy or colonoscopy. You may have a sigmoidoscopy every 5 years or a colonoscopy every 10 years starting at age 20.  Hepatitis C blood test.  Hepatitis B blood test.  Sexually transmitted disease (STD) testing.  Diabetes screening. This is done by checking your blood sugar (glucose) after you have not eaten for a while (fasting). You may have this done every 1-3 years.  Bone density scan. This is done to screen for osteoporosis. You may have this done starting at age 1.  Mammogram. This may be done every 1-2 years. Talk to your health care provider about how often you should have regular mammograms. Talk with your health care provider about your test results, treatment options, and if necessary, the need for more tests. Vaccines  Your  health care provider may recommend certain vaccines, such as:  Influenza vaccine. This is recommended every year.  Tetanus, diphtheria, and acellular pertussis (Tdap, Td) vaccine. You may need a Td booster every 10 years.  Zoster vaccine. You may need this after age 40.  Pneumococcal 13-valent  conjugate (PCV13) vaccine. One dose is recommended after age 41.  Pneumococcal polysaccharide (PPSV23) vaccine. One dose is recommended after age 13. Talk to your health care provider about which screenings and vaccines you need and how often you need them. This information is not intended to replace advice given to you by your health care provider. Make sure you discuss any questions you have with your health care provider. Document Released: 12/25/2015 Document Revised: 08/17/2016 Document Reviewed: 09/29/2015 Elsevier Interactive Patient Education  2017 [place] Prevention in the Home Falls can cause injuries. They can happen to people of all ages. There are many things you can do to make your home safe and to help prevent falls. What can I do on the outside of my home?  Regularly fix the edges of walkways and driveways and fix any cracks.  Remove anything that might make you trip as you walk through a door, such as a raised step or threshold.  Trim any bushes or trees on the path to your home.  Use bright outdoor lighting.  Clear any walking paths of anything that might make someone trip, such as rocks or tools.  Regularly check to see if handrails are loose or broken. Make sure that both sides of any steps have handrails.  Any raised decks and porches should have guardrails on the edges.  Have any leaves, snow, or ice cleared regularly.  Use sand or salt on walking paths during winter.  Clean up any spills in your garage right away. This includes oil or grease spills. What can I do in the bathroom?  Use night lights.  Install grab bars by the toilet and in the tub and shower. Do not use towel bars as grab bars.  Use non-skid mats or decals in the tub or shower.  If you need to sit down in the shower, use a plastic, non-slip stool.  Keep the floor dry. Clean up any water that spills on the floor as soon as it happens.  Remove soap buildup in the tub or  shower regularly.  Attach bath mats securely with double-sided non-slip rug tape.  Do not have throw rugs and other things on the floor that can make you trip. What can I do in the bedroom?  Use night lights.  Make sure that you have a light by your bed that is easy to reach.  Do not use any sheets or blankets that are too big for your bed. They should not hang down onto the floor.  Have a firm chair that has side arms. You can use this for support while you get dressed.  Do not have throw rugs and other things on the floor that can make you trip. What can I do in the kitchen?  Clean up any spills right away.  Avoid walking on wet floors.  Keep items that you use a lot in easy-to-reach places.  If you need to reach something above you, use a strong step stool that has a grab bar.  Keep electrical cords out of the way.  Do not use floor polish or wax that makes floors slippery. If you must use wax, use non-skid floor wax.  Do not  have throw rugs and other things on the floor that can make you trip. What can I do with my stairs?  Do not leave any items on the stairs.  Make sure that there are handrails on both sides of the stairs and use them. Fix handrails that are broken or loose. Make sure that handrails are as long as the stairways.  Check any carpeting to make sure that it is firmly attached to the stairs. Fix any carpet that is loose or worn.  Avoid having throw rugs at the top or bottom of the stairs. If you do have throw rugs, attach them to the floor with carpet tape.  Make sure that you have a light switch at the top of the stairs and the bottom of the stairs. If you do not have them, ask someone to add them for you. What else can I do to help prevent falls?  Wear shoes that:  Do not have high heels.  Have rubber bottoms.  Are comfortable and fit you well.  Are closed at the toe. Do not wear sandals.  If you use a stepladder:  Make sure that it is fully  opened. Do not climb a closed stepladder.  Make sure that both sides of the stepladder are locked into place.  Ask someone to hold it for you, if possible.  Clearly mark and make sure that you can see:  Any grab bars or handrails.  First and last steps.  Where the edge of each step is.  Use tools that help you move around (mobility aids) if they are needed. These include:  Canes.  Walkers.  Scooters.  Crutches.  Turn on the lights when you go into a dark area. Replace any light bulbs as soon as they burn out.  Set up your furniture so you have a clear path. Avoid moving your furniture around.  If any of your floors are uneven, fix them.  If there are any pets around you, be aware of where they are.  Review your medicines with your doctor. Some medicines can make you feel dizzy. This can increase your chance of falling. Ask your doctor what other things that you can do to help prevent falls. This information is not intended to replace advice given to you by your health care provider. Make sure you discuss any questions you have with your health care provider. Document Released: 09/24/2009 Document Revised: 05/05/2016 Document Reviewed: 01/02/2015 Elsevier Interactive Patient Education  2017 [employer].

## 2020-11-09 NOTE — Progress Notes (Signed)
Subjective:   NORVELLA LOSCALZO is a 84 y.o. female who presents for Medicare Annual (Subsequent) preventive examination.  Review of Systems    N/A  Cardiac Risk Factors include: advanced age (>29men, >15 women)     Objective:    Today's Vitals   11/09/20 1051  BP: 130/70  Pulse: 63  Temp: 97.6 F (36.4 C)  TempSrc: Oral  SpO2: 97%  Weight: 117 lb 1 oz (53.1 kg)  Height: 5\' 4"  (1.626 m)   Body mass index is 20.09 kg/m.  Advanced Directives 11/09/2020 10/05/2020 07/25/2020 06/04/2020 02/19/2020 11/18/2019 01/22/2019  Does Patient Have a Medical Advance Directive? Yes Yes No Yes Yes Yes Yes  Type of Paramedic of [place];Living will [place];Living will;Out of facility DNR (pink MOST or yellow form) - - Press photographer;Living will - [place];Living will  Does patient want to make changes to medical advance directive? No - Patient declined - - - - - No - Patient declined  Copy of [place] in Chart? No - copy requested - - - - - No - copy requested  Would patient like information on creating a medical advance directive? - - No - Patient declined - - - -    Current Medications (verified) Outpatient Encounter Medications as of 11/09/2020  Medication Sig  . CALCIUM CITRATE-VITAMIN D PO Take by mouth.  . carbidopa-levodopa (SINEMET IR) 25-100 MG tablet Take 1 tablet by mouth 3 (three) times daily.  . dorzolamide-timolol (COSOPT) 22.3-6.8 MG/ML ophthalmic solution dorzolamide 22.3 mg-timolol 6.8 mg/mL eye drops  . estradiol (ESTRACE) 0.5 MG tablet   . Multiple Vitamin (MULTIVITAMIN) capsule Take 1 capsule by mouth daily.  . progesterone (PROMETRIUM) 100 MG capsule Take 100 mg by mouth at bedtime.   . psyllium (METAMUCIL SMOOTH TEXTURE) 58.6 % powder Take 1 packet by mouth daily.  . rivastigmine (EXELON) 3 MG capsule Take 3 mg by mouth 2 (two) times daily.   . sertraline (ZOLOFT) 50 MG tablet  Take 1 tablet (50 mg total) by mouth daily.  . Acetaminophen (TYLENOL PO) Take by mouth as needed.   . [DISCONTINUED] OVER THE COUNTER MEDICATION Take 1 packet by mouth daily. Dissolve one packet in a glass of water each morning. Emergen-C   No facility-administered encounter medications on file as of 11/09/2020.    Allergies (verified) Aricept [donepezil hcl], Cephalexin, Lorazepam, Melatonin, and Sinemet [carbidopa-levodopa]   History: Past Medical History:  Diagnosis Date  . Allergic rhinitis 02/07/2008   Qualifier: Diagnosis of  By: [place], [place]    . Contact dermatitis 03/16/2013  . Generalized anxiety disorder 02/19/2013   has seen [name], [name] and now [name] for counseling  . Hemorrhoids, external   . Hot flashes   . Hyperlipemia 02/07/2008   Qualifier: Diagnosis of  By: Danny Lawless CMA, [place]   Long standing problem. Pretreatment LDL 175 in 2006, 210 in 2010.  Had tried lipitor but question of rash after 3 days of use. Switched to zocor but reports having leg pain. Retried Lipitor - weakness.  Currently on no medications   . Hypertension   . Irritable bowel syndrome   . Major depressive disorder 03/18/2016  . Major neurocognitive disorder, possible Alzheimer's disease 01/27/2019  . Palpitations 02/19/2013   On-set of symptoms March '14. EKG with NSR   . Parkinson disease [facility])    Past Surgical History:  Procedure Laterality Date  . benign moles removed    .  CATARACT EXTRACTION W/ INTRAOCULAR LENS IMPLANT Bilateral    right - Nov '11, left - Nov '13 [place]  . HEMORRHOID SURGERY    . MANDIBLE SURGERY    . sebaceous cyst excised     Family History  Problem Relation Age of Onset  . Dementia Mother   . Depression Mother 85  . Heart disease Father   . Cancer Sister        breast  . Heart disease Other   . Heart disease Other   . Heart disease Other   . Cancer Sister        breast  . Hypothyroidism Sister   . Asthma Sister   . Rheum arthritis  Paternal Grandmother   . Rheum arthritis Daughter    Social History   Socioeconomic History  . Marital status: Widowed    Spouse name: Not on file  . Number of children: 4  . Years of education: 16  . Highest education level: Bachelor's degree (e.g., BA, AB, BS)  Occupational History  . Occupation: Freight forwarder at [employer]    Comment: Retired  Tobacco Use  . Smoking status: Former Smoker    Packs/day: 0.10    Years: 1.00    Pack years: 0.10    Types: Cigarettes    Quit date: 12/12/1962    Years since quitting: 57.9  . Smokeless tobacco: Never Used  . Tobacco comment: smoked for 3 months in college   Vaping Use  . Vaping Use: Never used  Substance and Sexual Activity  . Alcohol use: Not Currently    Comment: rarely  . Drug use: No  . Sexual activity: Not Currently    Partners: Male  Other Topics Concern  . Not on file  Social History Narrative   College grad. Married '61. 4 daughters, 2 grandchildren. 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   Advanced Directives: Has Living Will, HCPOA: [place] [phone]         Pt lives in 2 story home - her husband currently resides in nursing facility      01/22/19: Pt. Lives in [place] home, daughters check in on patient, managing medications   Pt. Still drives, manages yardwork she says   Enjoys swim aerobics at the [facility]   Right handed   Social Determinants of Health   Financial Resource Strain: Low Risk   . Difficulty of Paying Living Expenses: Not hard at all  Food Insecurity: No Food Insecurity  . Worried About Charity fundraiser in the Last Year: Never true  . Ran Out of Food in the Last Year: Never true  Transportation Needs: No Transportation Needs  . Lack of Transportation (Medical): No  . Lack of Transportation (Non-Medical): No  Physical Activity: Sufficiently Active  . Days of Exercise per Week: 5 days  . Minutes of Exercise per Session: 30 min  Stress: No Stress Concern Present  . Feeling  of Stress : Not at all  Social Connections: Moderately Integrated  . Frequency of Communication with Friends and Family: More than three times a week  . Frequency of Social Gatherings with Friends and Family: More than three times a week  . Attends Religious Services: 1 to 4 times per year  . Active Member of Clubs or Organizations: Yes  . Attends Archivist Meetings: More than 4 times per year  . Marital Status: Widowed    Tobacco Counseling Counseling given: Not Answered Comment: smoked for 3 months in college  Clinical Intake:  Pre-visit preparation completed: Yes  Pain : No/denies pain     Nutritional Risks: None Diabetes: No  How often do you need to have someone help you when you read instructions, pamphlets, or other written materials from your doctor or pharmacy?: 1 - Never What is the last grade level you completed in school?: 4 years of College  Diabetic?No   Interpreter Needed?: No  Information entered by :: [place] of Daily Living In your present state of health, do you have any difficulty performing the following activities: 11/09/2020  Hearing? N  Vision? Y  Comment has peripheral vision loss  Difficulty concentrating or making decisions? Y  Walking or climbing stairs? N  Dressing or bathing? N  Doing errands, shopping? Y  Preparing Food and eating ? Y  Using the Toilet? N  In the past six months, have you accidently leaked urine? Y  Do you have problems with loss of bowel control? N  Managing your Medications? Y  Managing your Finances? Y  Housekeeping or managing your Housekeeping? Y  Some recent data might be hidden    Patient Care Team: [name], MD as PCP - General (Family Medicine) Cher Nakai, Lexine Baton, DO as Consulting Physician (Ophthalmology) Thayer Headings, PMHNP as Nurse Practitioner (Psychiatry) Kirtland Bouchard, PhD (Inactive) as Consulting Physician (Psychology) Delice Lesch Lezlie Octave, MD as  Consulting Physician (Neurology) [place], Grant Ruts, PsyD (Inactive) (Psychiatry) Cameron Sprang, MD as Consulting Physician (Neurology)  Indicate any recent Medical Services you may have received from other than [facility] providers in the past year (date may be approximate).     Assessment:   This is a routine wellness examination for [place].  Hearing/Vision screen  Hearing Screening   125Hz  250Hz  500Hz  1000Hz  2000Hz  3000Hz  4000Hz  6000Hz  8000Hz   Right ear:           Left ear:           Vision Screening Comments: Patient states gets eyes checked once per year. Has had issues with peripheral vision    Dietary issues and exercise activities discussed: Current Exercise Habits: Structured exercise class, Type of exercise: yoga, Time (Minutes): 30, Frequency (Times/Week): 5, Weekly Exercise (Minutes/Week): 150, Intensity: Mild, Exercise limited by: neurologic condition(s)  Goals    . Exercise 3x per week (30 min per time)    . patient     Getting up happy and feeling good     . Patient Stated     Continue to enjoy your reprieve and good feelings  Enjoying your life!    . Patient Stated     Wean off medications I don't need! Return to walking routine, sticking to a daily routine      Depression Screen PHQ 2/9 Scores 11/09/2020 09/16/2019 09/16/2019 06/17/2019 01/22/2019 09/24/2018 01/19/2018  PHQ - 2 Score 1 0 0 0 1 1 0  PHQ- 9 Score 1 0 0 0 2 3 -    Fall Risk Fall Risk  11/09/2020 10/05/2020 06/04/2020 02/19/2020 11/18/2019  Falls in the past year? 1 0 0 0 0  Number falls in past yr: 1 0 0 0 -  Comment Tripped twice while walking has visual difficulties - - - -  Injury with Fall? 0 0 0 0 -  Risk for fall due to : History of fall(s);Impaired balance/gait;Impaired vision - - - -  Follow up Falls evaluation completed;Falls prevention discussed - - - -    Any stairs in or around the home?  No  If so, are there any without handrails? No  Home free of loose throw rugs in walkways, pet beds,  electrical cords, etc? Yes  Adequate lighting in your home to reduce risk of falls? Yes   ASSISTIVE DEVICES UTILIZED TO PREVENT FALLS:  Life alert? Yes  Use of a cane, walker or w/c? No  Grab bars in the bathroom? Yes  Shower chair or bench in shower? Yes  Elevated toilet seat or a handicapped toilet? Yes   TIMED UP AND GO:  Was the test performed? Yes .  Length of time to ambulate 10 feet: 6 sec.   Gait slow and steady without use of assistive device  Cognitive Function: MMSE - Mini Mental State Exam 06/19/2018 01/19/2018 12/21/2017 09/26/2017 01/10/2017  Not completed: - (No Data) - - (No Data)  Orientation to time 5 - 4 3 -  Orientation to Place 5 - 4 5 -  Registration 3 - 3 3 -  Attention/ Calculation 5 - 5 5 -  Recall 0 - 3 3 -  Language- name 2 objects 2 - 2 2 -  Language- repeat 1 - 1 1 -  Language- follow 3 step command 3 - 3 3 -  Language- read & follow direction 1 - 1 1 -  Write a sentence 1 - 1 1 -  Copy design 1 - 1 1 -  Total score 27 - 28 28 -   Montreal Cognitive Assessment  08/14/2019 03/14/2019  Visuospatial/ Executive (0/5) - 0  Naming (0/3) - 0  Attention: Read list of digits (0/2) 2 2  Attention: Read list of letters (0/1) 1 1  Attention: Serial 7 subtraction starting at 100 (0/3) 3 3  Language: Repeat phrase (0/2) 2 2  Language : Fluency (0/1) 1 1  Abstraction (0/2) 2 1  Delayed Recall (0/5) 0 0  Orientation (0/6) 4 3  Total - 13      Immunizations Immunization History  Administered Date(s) Administered  . Fluad Quad(high Dose 65+) 08/23/2019  . Influenza Split 08/12/2012  . Influenza Whole 09/12/2009  . Influenza, High Dose Seasonal PF 08/24/2013, 09/04/2015, 08/29/2016, 08/24/2017, 09/24/2018, 10/15/2018  . Influenza-Unspecified 08/12/2012, 09/11/2014, 09/02/2015, 09/11/2016, 09/08/2017, 08/23/2019, 09/08/2020  . PFIZER SARS-COV-2 Vaccination 01/04/2020, 01/26/2020  . Pneumococcal Conjugate-13 10/31/2014  . Pneumococcal Polysaccharide-23  08/23/2019, 09/30/2019, 10/29/2019  . Pneumococcal-Unspecified 08/24/2013, 10/12/2014  . Td 05/14/2007  . Tdap 10/31/2014  . Zoster 09/24/2013    TDAP status: Up to date Flu Vaccine status: Up to date Pneumococcal vaccine status: Up to date Covid-19 vaccine status: Completed vaccines  Qualifies for Shingles Vaccine? Yes   Zostavax completed Yes   Shingrix Completed?: No.    Education has been provided regarding the importance of this vaccine. Patient has been advised to call insurance company to determine out of pocket expense if they have not yet received this vaccine. Advised may also receive vaccine at local pharmacy or Health Dept. Verbalized acceptance and understanding.  Screening Tests Health Maintenance  Topic Date Due  . MAMMOGRAM  01/07/2021  . TETANUS/TDAP  10/31/2024  . INFLUENZA VACCINE  Completed  . DEXA SCAN  Completed  . COVID-19 Vaccine  Completed  . PNA vac Low Risk Adult  Completed    Health Maintenance  There are no preventive care reminders to display for this patient.  Colorectal cancer screening: No longer required.  Mammogram status: Completed 01/08/2020. Repeat every year Bone Density status: Completed 10/17/2018. Results reflect: Bone density results: NORMAL. Repeat every 0  years.  Lung Cancer Screening: (Low Dose CT Chest recommended if Age 34-80 years, 30 pack-year currently smoking OR have quit w/in 15years.) does not qualify.   Lung Cancer Screening Referral: N/A   Additional Screening:  Hepatitis C Screening: does not qualify;  Vision Screening: Recommended annual ophthalmology exams for early detection of glaucoma and other disorders of the eye. Is the patient up to date with their annual eye exam?  Yes  Who is the provider or what is the name of the office in which the patient attends annual eye exams? Dr. Idamae Schuller  If pt is not established with a provider, would they like to be referred to a provider to establish care? No .    Dental Screening: Recommended annual dental exams for proper oral hygiene  Community Resource Referral / Chronic Care Management: CRR required this visit?  No   CCM required this visit?  No      Plan:     I have personally reviewed and noted the following in the patient's chart:   . Medical and social history . Use of alcohol, tobacco or illicit drugs  . Current medications and supplements . Functional ability and status . Nutritional status . Physical activity . Advanced directives . List of other physicians . Hospitalizations, surgeries, and ER visits in previous 12 months . Vitals . Screenings to include cognitive, depression, and falls . Referrals and appointments  In addition, I have reviewed and discussed with patient certain preventive protocols, quality metrics, and best practice recommendations. A written personalized care plan for preventive services as well as general preventive health recommendations were provided to patient.     Ofilia Neas, LPN   [id number]   Nurse Notes: None

## 2020-11-09 NOTE — Progress Notes (Signed)
[name] DOB: October 18, 1936 Encounter date: 11/09/2020  This is a 84 y.o. female who presents with Chief Complaint  Patient presents with  . Hypertension  . Memory Loss    History of present illness: She has been following with neurology regularly.  She has done well on low-dose Sinemet.  Plan is to repeat neuropsychological testing in March.  Per neurology notes, she is doing physical therapy, Occupational Therapy, and speech therapy.  She is seeing behavioral health.  Has been adjusting better - getting her back to her house to do yard work/good natural therapy. Not operating in "crisis mode" anymore.   She is eating ok. Food is excellent at living facility.    Allergies  Allergen Reactions  . Aricept [Donepezil Hcl]     Hallucinations, confusion  . Cephalexin     REACTION: diaphoretic and drop in Blood pressure  . Lorazepam Other (See Comments)    amnesia  . Melatonin   . Sinemet [Carbidopa-Levodopa]     confusion   Current Meds  Medication Sig  . Acetaminophen (TYLENOL PO) Take by mouth as needed.   [name] CALCIUM CITRATE-VITAMIN D PO Take by mouth.  . carbidopa-levodopa (SINEMET IR) 25-100 MG tablet Take 1 tablet by mouth 3 (three) times daily.  . dorzolamide-timolol (COSOPT) 22.3-6.8 MG/ML ophthalmic solution dorzolamide 22.3 mg-timolol 6.8 mg/mL eye drops  . estradiol (ESTRACE) 0.5 MG tablet   . Multiple Vitamin (MULTIVITAMIN) capsule Take 1 capsule by mouth daily.  . progesterone (PROMETRIUM) 100 MG capsule Take 100 mg by mouth at bedtime.   . psyllium (METAMUCIL SMOOTH TEXTURE) 58.6 % powder Take 1 packet by mouth daily.  . rivastigmine (EXELON) 3 MG capsule Take 3 mg by mouth 2 (two) times daily.   . sertraline (ZOLOFT) 50 MG tablet Take 1 tablet (50 mg total) by mouth daily.    Review of Systems  Constitutional: Negative for chills, fatigue and fever.  Respiratory: Negative for cough, chest tightness, shortness of breath and wheezing.   Cardiovascular: Negative  for chest pain, palpitations and leg swelling.  Neurological: Negative for dizziness and headaches.  Psychiatric/Behavioral: Positive for sleep disturbance (has been having increased vivid/scary dreams. makes her afraid to fall asleep. also acting out in dreams - things have moved around when she gets up. no injuries.).    Objective:  BP 130/70 Comment: vitals performed by Shannon-jaf  Pulse 63   Temp 97.6 F (36.4 C) (Oral)   Wt 117 lb 1.6 oz (53.1 kg)   SpO2 97%   BMI 20.10 kg/m   Weight: 117 lb 1.6 oz (53.1 kg)   BP Readings from Last 3 Encounters:  11/09/20 130/70  11/09/20 130/70  10/05/20 (!) 149/76   Wt Readings from Last 3 Encounters:  11/09/20 117 lb 1.6 oz (53.1 kg)  11/09/20 117 lb 1 oz (53.1 kg)  10/05/20 118 lb 9.6 oz (53.8 kg)    Physical Exam Constitutional:      General: She is not in acute distress.    Appearance: She is well-developed.  Cardiovascular:     Rate and Rhythm: Normal rate and regular rhythm.     Heart sounds: Normal heart sounds. No murmur heard.  No friction rub.  Pulmonary:     Effort: Pulmonary effort is normal. No respiratory distress.     Breath sounds: Normal breath sounds. No wheezing or rales.  Musculoskeletal:     Right lower leg: No edema.     Left lower leg: No edema.  Neurological:  Mental Status: She is alert and oriented to person, place, and time.  Psychiatric:        Behavior: Behavior normal.        Cognition and Memory: She exhibits impaired recent memory.     Comments: Mood is much different/improved today. She is enjoying herself; feels purpose with getting up in morning. She is really enjoying trips back to her house to do yard work. She is participating in exercise and PT at living facility. Enjoys their food. She is making friends, but sometimes still feels like she is an Clinical biochemist.     Assessment/Plan  1. Primary hypertension Blood pressures well controlled without medications.  2. Major neurocognitive  disorder, possible Alzheimer's disease She does continue to have some progression of memory loss.  She is following up regularly with neurology.  I have reached out to them regarding worsening of sleep disruption/vivid dreams and being fearful to fall asleep secondary to this.  Currently taking rivastigmine 3 mg twice daily.  3. Hyperlipidemia, unspecified hyperlipidemia type Cholesterols been diet controlled.  4. Generalized anxiety disorder This is improved significantly.  Patient is not having significant/severe anxiety and is able to do more.  She is more at peace with mood currently.  Continue Zoloft 50 mg daily.  5. Mild episode of recurrent major depressive disorder ([place]) Has been stable with Zoloft 50 mg daily.  She is tolerating this medication well.  Continue this current dose.   Return in about 6 months (around 05/09/2021) for Chronic condition visit.    Micheline Rough, MD

## 2020-11-11 DIAGNOSIS — R278 Other lack of coordination: Secondary | ICD-10-CM | POA: Diagnosis not present

## 2020-11-11 DIAGNOSIS — R488 Other symbolic dysfunctions: Secondary | ICD-10-CM | POA: Diagnosis not present

## 2020-11-11 DIAGNOSIS — G20 Parkinson's disease: Secondary | ICD-10-CM | POA: Diagnosis not present

## 2020-11-23 ENCOUNTER — Ambulatory Visit (INDEPENDENT_AMBULATORY_CARE_PROVIDER_SITE_OTHER): Payer: Medicare Other | Admitting: Psychology

## 2020-11-23 DIAGNOSIS — F411 Generalized anxiety disorder: Secondary | ICD-10-CM | POA: Diagnosis not present

## 2020-11-25 DIAGNOSIS — R278 Other lack of coordination: Secondary | ICD-10-CM | POA: Diagnosis not present

## 2020-11-25 DIAGNOSIS — R488 Other symbolic dysfunctions: Secondary | ICD-10-CM | POA: Diagnosis not present

## 2020-11-25 DIAGNOSIS — G20 Parkinson's disease: Secondary | ICD-10-CM | POA: Diagnosis not present

## 2020-12-02 DIAGNOSIS — G20 Parkinson's disease: Secondary | ICD-10-CM | POA: Diagnosis not present

## 2020-12-02 DIAGNOSIS — R488 Other symbolic dysfunctions: Secondary | ICD-10-CM | POA: Diagnosis not present

## 2020-12-02 DIAGNOSIS — R278 Other lack of coordination: Secondary | ICD-10-CM | POA: Diagnosis not present

## 2020-12-16 ENCOUNTER — Ambulatory Visit (INDEPENDENT_AMBULATORY_CARE_PROVIDER_SITE_OTHER): Payer: Medicare Other | Admitting: Psychology

## 2020-12-16 DIAGNOSIS — F411 Generalized anxiety disorder: Secondary | ICD-10-CM | POA: Diagnosis not present

## 2021-01-08 ENCOUNTER — Ambulatory Visit (INDEPENDENT_AMBULATORY_CARE_PROVIDER_SITE_OTHER): Payer: Medicare Other | Admitting: Psychology

## 2021-01-08 DIAGNOSIS — F411 Generalized anxiety disorder: Secondary | ICD-10-CM

## 2021-01-13 ENCOUNTER — Ambulatory Visit: Payer: Medicare Other | Admitting: Psychology

## 2021-02-01 ENCOUNTER — Encounter: Payer: Self-pay | Admitting: Neurology

## 2021-02-01 ENCOUNTER — Ambulatory Visit (INDEPENDENT_AMBULATORY_CARE_PROVIDER_SITE_OTHER): Payer: Medicare Other | Admitting: Neurology

## 2021-02-01 ENCOUNTER — Other Ambulatory Visit: Payer: Self-pay

## 2021-02-01 VITALS — BP 128/71 | HR 57 | Ht 64.0 in | Wt 119.6 lb

## 2021-02-01 DIAGNOSIS — F0281 Dementia in other diseases classified elsewhere with behavioral disturbance: Secondary | ICD-10-CM | POA: Diagnosis not present

## 2021-02-01 DIAGNOSIS — F02818 Dementia in other diseases classified elsewhere, unspecified severity, with other behavioral disturbance: Secondary | ICD-10-CM

## 2021-02-01 DIAGNOSIS — G301 Alzheimer's disease with late onset: Secondary | ICD-10-CM

## 2021-02-01 DIAGNOSIS — G20 Parkinson's disease: Secondary | ICD-10-CM | POA: Diagnosis not present

## 2021-02-01 DIAGNOSIS — Z1159 Encounter for screening for other viral diseases: Secondary | ICD-10-CM | POA: Diagnosis not present

## 2021-02-01 DIAGNOSIS — Z20828 Contact with and (suspected) exposure to other viral communicable diseases: Secondary | ICD-10-CM | POA: Diagnosis not present

## 2021-02-01 MED ORDER — RIVASTIGMINE TARTRATE 3 MG PO CAPS: 3.0000 mg | ORAL_CAPSULE | Freq: Two times a day (BID) | ORAL | 11 refills | Status: DC

## 2021-02-01 MED ORDER — CARBIDOPA-LEVODOPA 25-100 MG PO TABS: ORAL_TABLET | ORAL | 11 refills | Status: DC

## 2021-02-01 NOTE — Patient Instructions (Signed)
1. Increase Carbidopa/Levodopa 25/100mg : Take 1.5 tablets three times a day with meals  2. Continue Rivastigmine 3mg  twice a day  3. Discuss Sertraline increase with Thayer Headings  4. Continue physical therapy  5. Proceed with Neurocognitive testing as scheduled next week

## 2021-02-01 NOTE — Progress Notes (Signed)
NEUROLOGY FOLLOW UP OFFICE NOTE  [name] [id number] 1936-05-18  HISTORY OF PRESENT ILLNESS: I had the pleasure of seeing [name] in follow-up in the neurology clinic on 83/21/2022.  She is again accompanied by her daughter [name] who helps supplement the history today. The patient was last seen 4 months ago for dementia with behavioral disturbance and tremors, Neuropsychological testing in 01/2020 indicated Major Neurocognitive disorder likely due to Alzheimer's disease. She has had worsening left tremor, DATscan in 07/2020 showed reduced uptake in the right striatum, in a pattern seen in Parkinsonian syndromes, consideration for Lewy body dementia. She initially felt there was some improvement in left hand tremor with initiation of Sinemet, dose increased to 1 tab TID with meals on last visit, and now she states there has not been a big change with the tremor. She states she gets the medication in the morning, at 3pm, and before bedtime (not with meals). [name] reports that family has noticed she is having a harder time keeping the days of the week straight. They found the flashlight in the fridge one time. She is having more frequent delusions/hallucinations, thinking someone is in her room or thinks her husband, one of her daughters, or a random person is in her room. They are not sure if they occur when she is sleeping, sometimes she has a trouble differentiating reality and dream. She states it is a repetitive dream of her neighbor's house, like she is touring a house from the [place]. She does not sleep well sometimes. Family has noticed her peripheral vision continues to decline, she feels it is more a depth perception issue. She states she was chasing the cat this morning and it was fine. She has occasional dizziness. She reports depression, she wakes up feeling this is not doing anything positive about me, and states she has not been positive about being in a nursing facility from day 1.  Sometimes she forces herself to get out of her room and it bothers her to feel depressed. Family feels there is an overall improvement in mood, sometimes there is some paranoia that some things have been taken from her. She is on Sertraline 50mg  daily and sees Psychiatry and a counselor, Myra Gianotti. She denies any falls.    History on Initial Assessment 12/18/2017: This is a pleasant 85 yo RH woman with a history of hypertension, anxiety, depression, who presented for evaluation of memory loss. She feels her memory is "a little shaky." She endorses a lot of anxiety, and states it is causing her not to recall things and she does not understand it. Family started noticing memory changes around 2 years ago. She has been the main caregiver for her husband with dementia, who had been in and out of the hospital several times. She was "not dealing with things as well as I should." For instance, 2 years ago she was taking care of the managing their family home, but they noticed she was not keeping up with it, and turned it over to her sister. Her daughter took over finances in the Spring 2018 because she was afraid she would forget and she had never done the taxes in the past. She lives alone and denies missing medications. She denies getting lost driving. She has 3 cats at home that are cared for, she does not forget to feed them. She has no difficulties running the household, doing laundry and yardwork. She has learned how to use the leafblower. Their main concern is there  anxiety and depression have "really ramped up." She is so stressed and scattered, that she could not stay on task. Family started noticing anxiety around 4-5 years ago, but in January 2017 she was "in breakdown territory" and started Lexapro and counseling. It appears she was on a very low dose due to concern for side effects. She would wake up every morning between 3-4 AM with a panic attack, unable to reason with herself, shaky. Family reports  that she would forget what time they told her she would be picked up, even if it was in a text message in front of her. They have noticed lack of focus and concentration. She would ask the same question about this doctor's appointment and was quite anxious about today's visit. Family reports she "tends to catastrophize things." No paranoia or hallucinations. She was switched from Lexapro to Prozac at the family's request, and "something about Prozac bothers me all in my head."   She denies any headaches, vertigo, diplopia, dysarthria/dysphagia, neck/back pain, focal numbness/tingling/weakness, bowel/bladder dysfunction. She reports a "mental dizziness," where she states "my brain just feels full." She has tremors, R>L. She had lost her sense of smell after sinus surgery many years ago. Her mother had dementia in her 17s, her older sister has memory issues. No history of significant head injuries, no alcohol use.    PAST MEDICAL HISTORY: Past Medical History:  Diagnosis Date  . Allergic rhinitis 02/07/2008   Qualifier: Diagnosis of  By: [place], [place]    . Contact dermatitis 03/16/2013  . Generalized anxiety disorder 02/19/2013   has seen [name], [name] and now [name] for counseling  . Hemorrhoids, external   . Hot flashes   . Hyperlipemia 02/07/2008   Qualifier: Diagnosis of  By: Danny Lawless CMA, [place]   Long standing problem. Pretreatment LDL 175 in 2006, 210 in 2010.  Had tried lipitor but question of rash after 3 days of use. Switched to zocor but reports having leg pain. Retried Lipitor - weakness.  Currently on no medications   . Hypertension   . Irritable bowel syndrome   . Major depressive disorder 03/18/2016  . Major neurocognitive disorder, possible Alzheimer's disease 01/27/2019  . Palpitations 02/19/2013   On-set of symptoms March '14. EKG with NSR   . Parkinson disease [facility])     MEDICATIONS: Current Outpatient Medications on File Prior to Visit  Medication Sig Dispense  Refill  . Acetaminophen (TYLENOL PO) Take by mouth as needed.     [name] CALCIUM CITRATE-VITAMIN D PO Take by mouth.    . carbidopa-levodopa (SINEMET IR) 25-100 MG tablet Take 1 tablet by mouth 3 (three) times daily. 90 tablet 11  . dorzolamide-timolol (COSOPT) 22.3-6.8 MG/ML ophthalmic solution dorzolamide 22.3 mg-timolol 6.8 mg/mL eye drops    . estradiol (ESTRACE) 0.5 MG tablet     . Multiple Vitamin (MULTIVITAMIN) capsule Take 1 capsule by mouth daily.    . progesterone (PROMETRIUM) 100 MG capsule Take 100 mg by mouth at bedtime.     . psyllium (METAMUCIL SMOOTH TEXTURE) 58.6 % powder Take 1 packet by mouth daily. 283 g 12  . rivastigmine (EXELON) 3 MG capsule Take 3 mg by mouth 2 (two) times daily.     . sertraline (ZOLOFT) 50 MG tablet Take 1 tablet (50 mg total) by mouth daily. 90 tablet 1   No current facility-administered medications on file prior to visit.    ALLERGIES: Allergies  Allergen Reactions  . Aricept [Donepezil Hcl]  Hallucinations, confusion  . Cephalexin     REACTION: diaphoretic and drop in Blood pressure  . Lorazepam Other (See Comments)    amnesia  . Melatonin   . Sinemet [Carbidopa-Levodopa]     confusion    FAMILY HISTORY: Family History  Problem Relation Age of Onset  . Dementia Mother   . Depression Mother 74  . Heart disease Father   . Cancer Sister        breast  . Heart disease Other   . Heart disease Other   . Heart disease Other   . Cancer Sister        breast  . Hypothyroidism Sister   . Asthma Sister   . Rheum arthritis Paternal Grandmother   . Rheum arthritis Daughter     SOCIAL HISTORY: Social History   Socioeconomic History  . Marital status: Widowed    Spouse name: Not on file  . Number of children: 4  . Years of education: 16  . Highest education level: Bachelor's degree (e.g., BA, AB, BS)  Occupational History  . Occupation: Freight forwarder at [employer]    Comment: Retired  Tobacco Use  . Smoking status: Former  Smoker    Packs/day: 0.10    Years: 1.00    Pack years: 0.10    Types: Cigarettes    Quit date: 12/12/1962    Years since quitting: 58.1  . Smokeless tobacco: Never Used  . Tobacco comment: smoked for 3 months in college   Vaping Use  . Vaping Use: Never used  Substance and Sexual Activity  . Alcohol use: Not Currently    Comment: rarely  . Drug use: No  . Sexual activity: Not Currently    Partners: Male  Other Topics Concern  . Not on file  Social History Narrative   College grad. Married '61. 4 daughters, 2 grandchildren. 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   Advanced Directives: Has Living Will, HCPOA: Blanca Carreon [phone]         Pt lives in 2 story home - her husband currently resides in nursing facility      01/22/19: Pt. Lives in [facility] home, daughters check in on patient, managing medications   Pt. Still drives, manages yardwork she says   Enjoys swim aerobics at the [facility]   Right handed   Social Determinants of Health   Financial Resource Strain: Low Risk   . Difficulty of Paying Living Expenses: Not hard at all  Food Insecurity: No Food Insecurity  . Worried About Charity fundraiser in the Last Year: Never true  . Ran Out of Food in the Last Year: Never true  Transportation Needs: No Transportation Needs  . Lack of Transportation (Medical): No  . Lack of Transportation (Non-Medical): No  Physical Activity: Sufficiently Active  . Days of Exercise per Week: 5 days  . Minutes of Exercise per Session: 30 min  Stress: No Stress Concern Present  . Feeling of Stress : Not at all  Social Connections: Moderately Integrated  . Frequency of Communication with Friends and Family: More than three times a week  . Frequency of Social Gatherings with Friends and Family: More than three times a week  . Attends Religious Services: 1 to 4 times per year  . Active Member of Clubs or Organizations: Yes  . Attends Archivist Meetings: More than 4 times  per year  . Marital Status: Widowed  Intimate Partner Violence: Not At Risk  . Fear  of Current or Ex-Partner: No  . Emotionally Abused: No  . Physically Abused: No  . Sexually Abused: No     PHYSICAL EXAM: Vitals:   02/01/21 1412  BP: 128/71  Pulse: (!) 57  SpO2: 99%   General: No acute distress, flat affect Head:  Normocephalic/atraumatic Skin/Extremities: No rash, no edema Neurological Exam: alert and awake. No aphasia or dysarthria. Fund of knowledge is appropriate.  Recent and remote memory are impaired. Attention and concentration are normal.   Cranial nerves: Pupils equal, round. Extraocular movements intact with no nystagmus. Visual fields full.  No facial asymmetry.  Motor: +cogwheeling on left, muscle strength 5/5 throughout with no pronator drift.   Finger to nose testing intact.  Gait narrow-based, favoring left leg, with left hand tremor on ambulation. Decreased finger and foot taps on left. +resting tremor on left hand. No postural or action tremor.    IMPRESSION: This is an 85 yo RH woman with a history of hypertension, anxiety, depression, dementia. Neuropsychological testing in February 2021 was stable compared to a year ago, with diagnosis of Major Neurocognitive disorder (dementia) likely due to Alzheimer's disease. She was having more left hand tremors, DATscan in 07/2020 showed reduced uptake in the right striatum, in a pattern seen in Parkinsonian syndromes, consideration for Lewy body dementia. Repeat Neuropsychological evaluation scheduled next week. Continue Rivastigmine 3mg  BID. Increase Sinemet to 1.5 tabs TID with meals, side effects discussed. She endorses more depression, continue follow-up with Psychiatry and therapy, she is also having more delusions/hallucinations. Continue PT. Follow-up in 6 months, they know to call for any changes.   Thank you for allowing me to participate in her care.  Please do not hesitate to call for any questions or  concerns.   Ellouise Newer, M.D.   CC: Dr. Ethlyn Gallery

## 2021-02-04 ENCOUNTER — Ambulatory Visit (INDEPENDENT_AMBULATORY_CARE_PROVIDER_SITE_OTHER): Payer: Medicare Other | Admitting: Psychology

## 2021-02-04 DIAGNOSIS — F411 Generalized anxiety disorder: Secondary | ICD-10-CM

## 2021-02-09 ENCOUNTER — Ambulatory Visit: Payer: Medicare Other | Admitting: Psychology

## 2021-02-09 ENCOUNTER — Encounter: Payer: Self-pay | Admitting: Psychology

## 2021-02-09 ENCOUNTER — Other Ambulatory Visit: Payer: Self-pay

## 2021-02-09 ENCOUNTER — Telehealth: Payer: Self-pay | Admitting: Neurology

## 2021-02-09 ENCOUNTER — Ambulatory Visit (INDEPENDENT_AMBULATORY_CARE_PROVIDER_SITE_OTHER): Payer: Medicare Other | Admitting: Psychology

## 2021-02-09 DIAGNOSIS — F411 Generalized anxiety disorder: Secondary | ICD-10-CM

## 2021-02-09 DIAGNOSIS — F330 Major depressive disorder, recurrent, mild: Secondary | ICD-10-CM

## 2021-02-09 DIAGNOSIS — R4189 Other symptoms and signs involving cognitive functions and awareness: Secondary | ICD-10-CM

## 2021-02-09 DIAGNOSIS — F0390 Unspecified dementia without behavioral disturbance: Secondary | ICD-10-CM | POA: Diagnosis not present

## 2021-02-09 NOTE — Progress Notes (Signed)
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 estimated to be within the well above average range based upon a single-word reading test administered during the previous evaluation.   Processing speed was somewhat variable but largely fell in the exceptionally low normative ranges. Basic attention was above average to exceptionally high. More complex attention (e.g., working memory) was average to above average. Executive functioning was variable. She scored in the average across a verbal reasoning task, in the exceptionally low to average ranges across a response inhibition task, and exceptionally low across a cognitive flexibility task. She also performed in the below average range across a task assessing safety and judgment.   Assessed receptive language abilities were above average. Assessed expressive language was variable. Phonemic fluency was above average, semantic fluency was exceptionally low to average, and confrontation naming was average to well above average.     Assessed visuospatial/visuoconstructional abilities were below average to above average. Points were lost on her drawing of a clock due to errors in numerical spacing, as well as incorrect hand placement.     Learning (i.e., encoding) of novel verbal information was below average to average. Spontaneous delayed  recall (i.e., retrieval) of previously learned information was exceptionally low. Retention rates were 0% across a story learning task, 0% across a list learning task, and 0% across a figure drawing task. Performance across recognition tasks was exceptionally low to well below average, suggesting minimal evidence for information consolidation.   Results of emotional screening instruments suggested that recent symptoms of generalized anxiety were in the minimal range, while symptoms of depression were within the mild range. A screening instrument assessing recent sleep quality suggested the presence of minimal sleep dysfunction.  Tables of Scores:   Note: This summary of test scores accompanies the interpretive report and should not be considered in isolation without reference to the appropriate sections in the text. Descriptors are based on appropriate normative data and may be adjusted based on clinical judgment. The terms "impaired" and "within normal limits (WNL)" are used when a more specific level of functioning cannot be determined. Descriptors refer to the current evaluation only.          Effort Testing:     [facility]   January  2020 February 2021 Current    Dot Counting Test: --- --- --- --- Within Expectation  RBANS Effort Index: --- --- --- --- Below Expectation  WAIS-IV Reliable Digit Span: --- --- --- --- Within Expectation  D-KEFS Color Word Effort Index: --- --- --- --- Within Expectation         Orientation:        Raw Score Raw Score Raw Score Percentile   NAB Orientation, Form 1 --- 23/29 23/29 --- ---         Cognitive Screening:               Raw Score  Raw Score Raw Score Percentile   SLUMS: --- --- 18/30 --- ---         RBANS, Form A: Standard Score/ Scaled Score Standard Score/ Scaled Score Standard Score/ Scaled Score Percentile   Total Score 97 89 83 13 Below Average  Immediate Memory 106 106 90 25 Average    List Learning 14 12 6 9  Below Average    Story  Memory 8 10 11  63 Average  Visuospatial/Constructional 112 116 112 79 Above Average    Figure Copy 10 11 12  75 Above Average    Line Orientation 19/20 19/20 17/20  51-75 Average  Language 110 92 97 42 Average    Picture Naming 9/10 10/10 9/10 26-50 Average    Semantic Fluency 12 6 8 25  Average  Attention 112 106 97 42 Average    Digit Span 15 16 16  98 Exceptionally High    Coding 9 6 3 1  Exceptionally Low  Delayed Memory 52 44 40 <1 Exceptionally Low    List Recall 0/10 0/10 0/10 <2 Exceptionally Low    List Recognition 16/20 13/20 11/20  <2 Exceptionally Low     Story Recall 1 1 1  <1 Exceptionally Low    Story Recognition --- 5/12 6/12 5-6 Well Below Average    Figure Recall 1 1 1  <1 Exceptionally Low    Figure Recognition --- 1/8 2/8 1-5 Exceptionally Low         Intellectual Functioning:               Standard Score Standard Score Standard Score Percentile   Test of Premorbid Functioning: --- 121 --- --- ---         Attention/Executive Function:              Trail Making Test (TMT): Raw Score Raw Score Raw Score  (T Score) Percentile     Part A 57 secs.,  0 errors 42 secs.,  0 errors 92 secs.,  0 errors (20) <1 Exceptionally Low    Part B 222 secs.,  2 errors 127 secs.,  1 error 336 secs.,  4 errors (20) <1 Exceptionally Low           Scaled Score Scaled Score Scaled Score Percentile   WAIS-IV Digit Span: 13 14 13  84 Above Average    Forward 15 15 14  91 Above Average    Backward 14 14 13  84 Above Average    Sequencing 10 12 11  63 Average          Scaled Score Scaled Score Scaled Score Percentile   WAIS-IV Similarities: --- --- 12 75 Above Average         D-KEFS Color-Word Interference Test: Raw Score Raw Score Raw Score (Scaled Score) Percentile     Color Naming --- 32 secs. 36 secs. (10) 50 Average    Word Reading --- 23 secs. 29 secs. (9) 37 Average    Inhibition --- 114 secs. 105 secs. (8) 25 Average      Total Errors --- 4 errors 5 errors (9) 37 Average     Inhibition/Switching --- 136 secs. 157 secs. (3) 1 Exceptionally Low      Total Errors --- 14 errors 18 errors (1) <1 Exceptionally Low         NAB Executive Functions Module, Form 1: T Score T Score T Score Percentile     Judgment --- --- 40 16 Below Average         Language:  Verbal Fluency Test: Raw Score Raw Score Raw Score  (T Score) Percentile     Phonemic Fluency (FAS) 39 37 54 (62) 88 Above Average    Animal Fluency 40 19 10 (27) 1 Exceptionally Low          NAB Language Module, Form 1: T Score T Score T Score Percentile     Auditory Comprehension --- 58 57 75 Above Average    Naming 30/31 31/31 (64) 30/31 (63) 91 Well Above Average         Visuospatial/Visuoconstruction:        Raw Score Raw Score Raw Score Percentile   Clock Drawing: --- 8/10 6/10 --- Impaired         NAB Spatial Module, Form 1: T Score T Score T Score Percentile     Visual Discrimination --- --- 40 16 Below Average          Scaled Score Scaled Score Scaled Score Percentile   WAIS-IV Block Design: --- --- 7 16 Below Average         Mood and Personality:        Raw Score Raw Score Raw Score Percentile   Geriatric Depression Scale: 9 8 11  --- Mild  Geriatric Anxiety Scale: --- 13 7 --- Minimal    Somatic --- 4 1 --- Minimal    Cognitive --- 5 4 --- Mild    Affective --- 4 2 --- Minimal         Additional Questionnaires:        Raw Score Raw Score Raw Score Percentile   PROMIS Sleep Disturbance Questionnaire: --- 17 13 --- None to Slight     Informed Consent and Coding/Compliance:   The current evaluation represents a clinical evaluation for the purposes previously outlined by the referral source and is in no way reflective of a forensic evaluation.   Ms. Crewe was provided with a verbal description of the nature and purpose of the present neuropsychological evaluation. Also reviewed were the foreseeable risks and/or discomforts and benefits of the procedure, limits of confidentiality,  and mandatory reporting requirements of this provider. The patient was given the opportunity to ask questions and receive answers about the evaluation. Oral consent to participate was provided by the patient.   This evaluation was conducted by Christia Reading, Ph.D., licensed clinical neuropsychologist. Ms. Huneycutt completed a clinical interview with Dr. Melvyn Novas, billed as one unit [phone], and 150 minutes of cognitive testing and scoring, billed as one unit [phone] and four additional units [id number]. Psychometrist Milana Kidney, B.S., assisted Dr. Melvyn Novas with test administration and scoring procedures. As a separate and discrete service, Dr. Melvyn Novas spent a total of 160 minutes in interpretation and report writing billed as one unit [phone] and two units [id number].

## 2021-02-09 NOTE — Progress Notes (Signed)
   Psychometrician Note   Cognitive testing was administered to [employer] by Milana Kidney, B.S. (psychometrist) under the supervision of Dr. Christia Reading, Ph.D., licensed psychologist on 02/09/21. [name] did not appear overtly distressed by the testing session per behavioral observation or responses across self-report questionnaires. Rest breaks were offered.    The battery of tests administered was selected by Dr. Christia Reading, Ph.D. with consideration to [name]'s current level of functioning, the nature of her symptoms, emotional and behavioral responses during interview, level of literacy, observed level of motivation/effort, and the nature of the referral question. This battery was communicated to the psychometrist. Communication between Dr. Christia Reading, Ph.D. and the psychometrist was ongoing throughout the evaluation and Dr. Christia Reading, Ph.D. was immediately accessible at all times. Dr. Christia Reading, Ph.D. provided supervision to the psychometrist on the date of this service to the extent necessary to assure the quality of all services provided.    [name] will return within approximately 1-2 weeks for an interactive feedback session with Dr. Melvyn Novas at which time her test performances, clinical impressions, and treatment recommendations will be reviewed in detail. [name] understands she can contact our office should she require our assistance before this time.  A total of 150 minutes of billable time were spent face-to-face with [name] by the psychometrist. This includes both test administration and scoring time. Billing for these services is reflected in the clinical report generated by Dr. Christia Reading, Ph.D.  This note reflects time spent with the psychometrician and does not include test scores or any clinical interpretations made by Dr. Melvyn Novas. The full report will follow in a separate note.

## 2021-02-09 NOTE — Telephone Encounter (Signed)
Spoke to Pt daughter, [place] Ar called her to let her know the script was received.

## 2021-02-09 NOTE — Telephone Encounter (Signed)
Patient was in the office for Neuropsych and stated the carbidopa-levodopa prescription needs to be sent to [facility]. She is not sure where it was sent previously. [facility] is who [facility] orders their medications from, but she is not sure how it gets to [facility]. Brianna at [facility] does not have the prescription. Brianna's phone number is [phone], you will have to ask for Brianna.

## 2021-02-15 DIAGNOSIS — Z1159 Encounter for screening for other viral diseases: Secondary | ICD-10-CM | POA: Diagnosis not present

## 2021-02-15 DIAGNOSIS — Z20828 Contact with and (suspected) exposure to other viral communicable diseases: Secondary | ICD-10-CM | POA: Diagnosis not present

## 2021-02-16 ENCOUNTER — Ambulatory Visit (INDEPENDENT_AMBULATORY_CARE_PROVIDER_SITE_OTHER): Payer: Medicare Other | Admitting: Psychology

## 2021-02-16 ENCOUNTER — Other Ambulatory Visit: Payer: Self-pay

## 2021-02-16 DIAGNOSIS — F0390 Unspecified dementia without behavioral disturbance: Secondary | ICD-10-CM | POA: Diagnosis not present

## 2021-02-16 NOTE — Patient Instructions (Signed)
Should there be further progression of her current deficits over time, Ms. Fencl is unlikely to regain any independent living skills lost. Therefore, it is recommended that she remain as involved as possible in all aspects of household chores, finances, and medication management, with supervision to ensure adequate performance. She will likely benefit from the establishment and maintenance of a routine in order to maximize her functional abilities over time.  It will be important for Ms. Mount to have another person with her when in situations where she may need to process information, weigh the pros and cons of different options, and make decisions, in order to ensure that she fully understands and recalls all information to be considered.  If not already done, Ms. Radabaugh and her family may want to discuss her wishes regarding durable power of attorney and medical decision making, so that she can have input into these choices. Additionally, they may wish to discuss future plans for caretaking and seek out community options for in home/residential care should they become necessary.  Ms. Soden is encouraged to attend to lifestyle factors for brain health (e.g., regular physical exercise, good nutrition habits, regular participation in cognitively-stimulating activities, and general stress management techniques), which are likely to have benefits for both emotional adjustment and cognition.  Information important to remember should be provided in a written formal in all instances. This should be placed in a highly visible and commonly frequented area of her residence to help promote recall and limit confusion.   To address problems with processing speed, she may wish to consider:   -Ensuring that she is alerted when essential material or instructions are being presented   -Adjusting the speed at which new information is presented   -Allowing for more time in comprehending, processing, and responding  in conversation  To address problems with executive dysfunction, she may wish to consider:   -Avoiding external distractions when needing to concentrate   -Limiting exposure to fast paced environments with multiple sensory demands   -Writing down complicated information and using checklists   -Attempting and completing one task at a time (i.e., no multi-tasking)   -Verbalizing aloud each step of a task to maintain focus   -Taking frequent breaks during the completion of steps/tasks to avoid fatigue   -Reducing the amount of information considered at one time   -Scheduling more difficult activities for a time of day where she is usually most alert

## 2021-02-16 NOTE — Progress Notes (Signed)
   Neuropsychology Feedback Session [name]. [facility] of Neurology  Reason for Referral:   [name] a 85 y.o. right-handed Caucasian female referred by Ellouise Newer, M.D.,to characterize hercurrent cognitive functioning and assist with diagnostic clarity and treatment planning in the context of a prior diagnosis of a major neurocognitive disorder with concerns for Alzheimer's disease, a well as more recent worsening motor symptoms and a positive DaTscan.   Feedback:   [name] completed a comprehensive neuropsychological evaluation on 02/09/2021. Please refer to that encounter for the full report and recommendations. Briefly, results suggested continued and severe impairment surrounding retrieval and consolidation aspects of memory. Processing speed was also impaired, while performance variability was exhibited across executive functioning and semantic fluency. Regarding etiology, I continue to have strong concerns surrounding Alzheimer's disease. Despite being able to learning information reasonably well, she was amnestic after a brief delay with evidence for a memory storage deficit and rapid forgetting. Given the results of this scan, the potential for a "mixed dementia" presentation with facets of Alzheimer's disease and Parkinson's disease remains possible.  [name] was accompanied by her daughter during the current feedback session. Content of the current session focused on the results of her neuropsychological evaluation. [name] and her daughter were given the opportunity to ask questions and her questions were answered. They were encouraged to reach out should additional questions arise. A copy of her report was provided at the conclusion of the visit.      25 minutes were spent conducting the current feedback session with [name], billed as one unit [id number].

## 2021-02-22 DIAGNOSIS — Z1159 Encounter for screening for other viral diseases: Secondary | ICD-10-CM | POA: Diagnosis not present

## 2021-02-22 DIAGNOSIS — Z20828 Contact with and (suspected) exposure to other viral communicable diseases: Secondary | ICD-10-CM | POA: Diagnosis not present

## 2021-02-23 DIAGNOSIS — G20 Parkinson's disease: Secondary | ICD-10-CM | POA: Diagnosis not present

## 2021-02-23 DIAGNOSIS — R2681 Unsteadiness on feet: Secondary | ICD-10-CM | POA: Diagnosis not present

## 2021-02-23 DIAGNOSIS — R2689 Other abnormalities of gait and mobility: Secondary | ICD-10-CM | POA: Diagnosis not present

## 2021-02-24 ENCOUNTER — Ambulatory Visit (INDEPENDENT_AMBULATORY_CARE_PROVIDER_SITE_OTHER): Payer: Medicare Other | Admitting: Psychology

## 2021-02-24 DIAGNOSIS — F411 Generalized anxiety disorder: Secondary | ICD-10-CM

## 2021-02-25 DIAGNOSIS — R2681 Unsteadiness on feet: Secondary | ICD-10-CM | POA: Diagnosis not present

## 2021-02-25 DIAGNOSIS — R2689 Other abnormalities of gait and mobility: Secondary | ICD-10-CM | POA: Diagnosis not present

## 2021-02-25 DIAGNOSIS — G20 Parkinson's disease: Secondary | ICD-10-CM | POA: Diagnosis not present

## 2021-03-01 DIAGNOSIS — Z20828 Contact with and (suspected) exposure to other viral communicable diseases: Secondary | ICD-10-CM | POA: Diagnosis not present

## 2021-03-01 DIAGNOSIS — R2689 Other abnormalities of gait and mobility: Secondary | ICD-10-CM | POA: Diagnosis not present

## 2021-03-01 DIAGNOSIS — R2681 Unsteadiness on feet: Secondary | ICD-10-CM | POA: Diagnosis not present

## 2021-03-01 DIAGNOSIS — Z1159 Encounter for screening for other viral diseases: Secondary | ICD-10-CM | POA: Diagnosis not present

## 2021-03-01 DIAGNOSIS — G20 Parkinson's disease: Secondary | ICD-10-CM | POA: Diagnosis not present

## 2021-03-03 DIAGNOSIS — R2681 Unsteadiness on feet: Secondary | ICD-10-CM | POA: Diagnosis not present

## 2021-03-03 DIAGNOSIS — G20 Parkinson's disease: Secondary | ICD-10-CM | POA: Diagnosis not present

## 2021-03-03 DIAGNOSIS — R2689 Other abnormalities of gait and mobility: Secondary | ICD-10-CM | POA: Diagnosis not present

## 2021-03-09 DIAGNOSIS — R2689 Other abnormalities of gait and mobility: Secondary | ICD-10-CM | POA: Diagnosis not present

## 2021-03-09 DIAGNOSIS — G20 Parkinson's disease: Secondary | ICD-10-CM | POA: Diagnosis not present

## 2021-03-09 DIAGNOSIS — R2681 Unsteadiness on feet: Secondary | ICD-10-CM | POA: Diagnosis not present

## 2021-03-11 DIAGNOSIS — R2689 Other abnormalities of gait and mobility: Secondary | ICD-10-CM | POA: Diagnosis not present

## 2021-03-11 DIAGNOSIS — R2681 Unsteadiness on feet: Secondary | ICD-10-CM | POA: Diagnosis not present

## 2021-03-11 DIAGNOSIS — G20 Parkinson's disease: Secondary | ICD-10-CM | POA: Diagnosis not present

## 2021-03-15 ENCOUNTER — Other Ambulatory Visit: Payer: Self-pay

## 2021-03-15 DIAGNOSIS — Z1159 Encounter for screening for other viral diseases: Secondary | ICD-10-CM | POA: Diagnosis not present

## 2021-03-15 DIAGNOSIS — Z20828 Contact with and (suspected) exposure to other viral communicable diseases: Secondary | ICD-10-CM | POA: Diagnosis not present

## 2021-03-15 MED ORDER — CARBIDOPA-LEVODOPA 25-100 MG PO TABS: ORAL_TABLET | ORAL | 11 refills | Status: DC

## 2021-03-16 DIAGNOSIS — R2689 Other abnormalities of gait and mobility: Secondary | ICD-10-CM | POA: Diagnosis not present

## 2021-03-16 DIAGNOSIS — R2681 Unsteadiness on feet: Secondary | ICD-10-CM | POA: Diagnosis not present

## 2021-03-16 DIAGNOSIS — G20 Parkinson's disease: Secondary | ICD-10-CM | POA: Diagnosis not present

## 2021-03-17 ENCOUNTER — Ambulatory Visit (INDEPENDENT_AMBULATORY_CARE_PROVIDER_SITE_OTHER): Payer: Medicare Other | Admitting: Psychology

## 2021-03-17 DIAGNOSIS — F411 Generalized anxiety disorder: Secondary | ICD-10-CM

## 2021-03-18 DIAGNOSIS — G20 Parkinson's disease: Secondary | ICD-10-CM | POA: Diagnosis not present

## 2021-03-18 DIAGNOSIS — R2689 Other abnormalities of gait and mobility: Secondary | ICD-10-CM | POA: Diagnosis not present

## 2021-03-18 DIAGNOSIS — R2681 Unsteadiness on feet: Secondary | ICD-10-CM | POA: Diagnosis not present

## 2021-03-23 DIAGNOSIS — R2681 Unsteadiness on feet: Secondary | ICD-10-CM | POA: Diagnosis not present

## 2021-03-23 DIAGNOSIS — G20 Parkinson's disease: Secondary | ICD-10-CM | POA: Diagnosis not present

## 2021-03-23 DIAGNOSIS — R2689 Other abnormalities of gait and mobility: Secondary | ICD-10-CM | POA: Diagnosis not present

## 2021-03-25 DIAGNOSIS — G20 Parkinson's disease: Secondary | ICD-10-CM | POA: Diagnosis not present

## 2021-03-25 DIAGNOSIS — R2689 Other abnormalities of gait and mobility: Secondary | ICD-10-CM | POA: Diagnosis not present

## 2021-03-25 DIAGNOSIS — R2681 Unsteadiness on feet: Secondary | ICD-10-CM | POA: Diagnosis not present

## 2021-03-29 DIAGNOSIS — Z20828 Contact with and (suspected) exposure to other viral communicable diseases: Secondary | ICD-10-CM | POA: Diagnosis not present

## 2021-03-29 DIAGNOSIS — Z1159 Encounter for screening for other viral diseases: Secondary | ICD-10-CM | POA: Diagnosis not present

## 2021-03-30 DIAGNOSIS — R2689 Other abnormalities of gait and mobility: Secondary | ICD-10-CM | POA: Diagnosis not present

## 2021-03-30 DIAGNOSIS — R2681 Unsteadiness on feet: Secondary | ICD-10-CM | POA: Diagnosis not present

## 2021-03-30 DIAGNOSIS — G20 Parkinson's disease: Secondary | ICD-10-CM | POA: Diagnosis not present

## 2021-04-01 DIAGNOSIS — R2689 Other abnormalities of gait and mobility: Secondary | ICD-10-CM | POA: Diagnosis not present

## 2021-04-01 DIAGNOSIS — R2681 Unsteadiness on feet: Secondary | ICD-10-CM | POA: Diagnosis not present

## 2021-04-01 DIAGNOSIS — G20 Parkinson's disease: Secondary | ICD-10-CM | POA: Diagnosis not present

## 2021-04-05 DIAGNOSIS — Z1159 Encounter for screening for other viral diseases: Secondary | ICD-10-CM | POA: Diagnosis not present

## 2021-04-05 DIAGNOSIS — Z20828 Contact with and (suspected) exposure to other viral communicable diseases: Secondary | ICD-10-CM | POA: Diagnosis not present

## 2021-04-06 ENCOUNTER — Other Ambulatory Visit: Payer: Self-pay

## 2021-04-06 ENCOUNTER — Encounter: Payer: Self-pay | Admitting: Family Medicine

## 2021-04-06 ENCOUNTER — Ambulatory Visit (INDEPENDENT_AMBULATORY_CARE_PROVIDER_SITE_OTHER): Payer: Medicare Other | Admitting: Family Medicine

## 2021-04-06 VITALS — BP 136/70 | HR 59 | Temp 97.4°F | Resp 16 | Ht 64.0 in | Wt 115.8 lb

## 2021-04-06 DIAGNOSIS — R2681 Unsteadiness on feet: Secondary | ICD-10-CM | POA: Diagnosis not present

## 2021-04-06 DIAGNOSIS — R300 Dysuria: Secondary | ICD-10-CM | POA: Diagnosis not present

## 2021-04-06 DIAGNOSIS — R410 Disorientation, unspecified: Secondary | ICD-10-CM

## 2021-04-06 DIAGNOSIS — F0390 Unspecified dementia without behavioral disturbance: Secondary | ICD-10-CM | POA: Diagnosis not present

## 2021-04-06 DIAGNOSIS — G20 Parkinson's disease: Secondary | ICD-10-CM | POA: Diagnosis not present

## 2021-04-06 DIAGNOSIS — R2689 Other abnormalities of gait and mobility: Secondary | ICD-10-CM | POA: Diagnosis not present

## 2021-04-06 LAB — POCT URINALYSIS DIPSTICK
Bilirubin, UA: NEGATIVE
Blood, UA: NEGATIVE
Glucose, UA: NEGATIVE
Ketones, UA: NEGATIVE
Leukocytes, UA: NEGATIVE
Nitrite, UA: NEGATIVE
Protein, UA: NEGATIVE
Urobilinogen, UA: 0.2 E.U./dL
pH, UA: 7 (ref 5.0–8.0)

## 2021-04-06 NOTE — Progress Notes (Signed)
ACUTE VISIT: Chief Complaint  Patient presents with  . possible uti   HPI: KathrynKathryn Kidd is a 85 y.o. female with hx of major neurocognitive disorder,Parkinson's disease,HTN,HLD,and anxiety here today with her daughter, who is concerned about possible UTI because of episode of confusion this morning. Her daughter provides most of the history.  She lives at [facility] assisted living. Earlier today she thought she was in [place]. Her daughter has noted progressive cognitive declining, she seems to be at her baseline at this time but she was to be sure she is not have a UTI.  According to daughter,[name] reported a fall, apparently she was on the floor when PT came and she got up to open the door. Her daughter is not sure if this really happened,she has not had changes in balance or signs of serious injury. She does not use assistance for walking. She is increasingly having more episodes of hallucinations and delusions.  Dysuria: Negative Urinary frequency: Negative Urinary urgency: Negative Incontinence: Negative Gross hematuria: Negative  No reports about fever, chills, change in appetite, or unusual fatigue. Today she ate breakfast and lunch.  Negative for abdominal pain, nausea, or vomiting. According to the daughter, last week she had "explosive" diarrhea x2 (4/21 and 4/24 2022) thought it was caused by eating spicy food.  No hx of recurrent UTI's. Ucx in 07/2020 no growth. 06/21/19:Multiple organisms present, each less than 10,000 CFU/mL. These organisms, commonly found on external and internal genitalia, are considered to be colonizers. No further testing performed.  OTC medications for this problem: None.  Review of Systems  Constitutional: Negative for activity change.  HENT: Negative for mouth sores, nosebleeds and sore throat.   Respiratory: Negative for cough, shortness of breath and wheezing.   Cardiovascular: Negative for chest pain and leg swelling.   Skin: Negative for pallor and rash.  Neurological: Negative for syncope, weakness and headaches.  Rest see pertinent positives and negatives per HPI.  Current Outpatient Medications on File Prior to Visit  Medication Sig Dispense Refill  . Acetaminophen (TYLENOL PO) Take by mouth as needed.     [name] CALCIUM CITRATE-VITAMIN D PO Take by mouth.    . carbidopa-levodopa (SINEMET IR) 25-100 MG tablet Take 1 tablets three times a day with meals 135 tablet 11  . dorzolamide-timolol (COSOPT) 22.3-6.8 MG/ML ophthalmic solution dorzolamide 22.3 mg-timolol 6.8 mg/mL eye drops    . estradiol (ESTRACE) 0.5 MG tablet     . Multiple Vitamin (MULTIVITAMIN) capsule Take 1 capsule by mouth daily.    . progesterone (PROMETRIUM) 100 MG capsule Take 100 mg by mouth at bedtime.    . psyllium (METAMUCIL SMOOTH TEXTURE) 58.6 % powder Take 1 packet by mouth daily. 283 g 12  . rivastigmine (EXELON) 3 MG capsule Take 1 capsule (3 mg total) by mouth 2 (two) times daily. 60 capsule 11  . sertraline (ZOLOFT) 50 MG tablet Take 1 tablet (50 mg total) by mouth daily. 90 tablet 1   No current facility-administered medications on file prior to visit.   Past Medical History:  Diagnosis Date  . Allergic rhinitis 02/07/2008  . Contact dermatitis 03/16/2013  . Generalized anxiety disorder 02/19/2013   has seen [name], [name], and now [name] for counseling  . Hemorrhoids, external   . Hot flashes   . Hyperlipemia 02/07/2008   Long standing problem. Pretreatment LDL 175 in 2006, 210 in 2010.  Had tried lipitor but question of rash after 3 days of  use. Switched to zocor but reports having leg pain. Retried Lipitor - weakness.  Currently on no medications   . Hypertension   . Irritable bowel syndrome   . Major depressive disorder 03/18/2016  . Major neurocognitive disorder 01/27/2019   possible Alzheimer's disease or mixed presentation  . Menopausal symptom 11/18/2019  . Palpitations 02/19/2013   Onset of  symptoms March '14. EKG with NSR   . Parkinson's disease    concerns given positive DaTScan   Allergies  Allergen Reactions  . Aricept [Donepezil Hcl]     Hallucinations, confusion  . Cephalexin     REACTION: diaphoretic and drop in Blood pressure  . Lorazepam Other (See Comments)    amnesia  . Melatonin   . Sinemet [Carbidopa-Levodopa]     confusion    Social History   Socioeconomic History  . Marital status: Widowed    Spouse name: Not on file  . Number of children: 4  . Years of education: 16  . Highest education level: Bachelor's degree (e.g., BA, AB, BS)  Occupational History  . Occupation: Retired    Comment: Freight forwarder at [employer]  . Smoking status: Former Smoker    Packs/day: 0.10    Years: 1.00    Pack years: 0.10    Types: Cigarettes    Quit date: 12/12/1962    Years since quitting: 58.3  . Smokeless tobacco: Never Used  . Tobacco comment: smoked for 3 months in college   Vaping Use  . Vaping Use: Never used  Substance and Sexual Activity  . Alcohol use: Not Currently    Comment: rarely  . Drug use: No  . Sexual activity: Not Currently    Partners: Male  Other Topics Concern  . Not on file  Social History Narrative   College grad. Married '61. 4 daughters, 2 grandchildren. 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   Advanced Directives: Has Living Will, HCPOA: [place] [phone]         [place]       01/22/19: Pt. Lives in [place] home, daughters check in on patient, managing medications   Pt. Still drives, manages yardwork she says   Enjoys swim aerobics at the [facility]   Right handed   Social Determinants of Health   Financial Resource Strain: Low Risk   . Difficulty of Paying Living Expenses: Not hard at all  Food Insecurity: No Food Insecurity  . Worried About Charity fundraiser in the Last Year: Never true  . Ran Out of Food in the Last Year: Never true  Transportation Needs: No Transportation Needs  .  Lack of Transportation (Medical): No  . Lack of Transportation (Non-Medical): No  Physical Activity: Sufficiently Active  . Days of Exercise per Week: 5 days  . Minutes of Exercise per Session: 30 min  Stress: No Stress Concern Present  . Feeling of Stress : Not at all  Social Connections: Moderately Integrated  . Frequency of Communication with Friends and Family: More than three times a week  . Frequency of Social Gatherings with Friends and Family: More than three times a week  . Attends Religious Services: 1 to 4 times per year  . Active Member of Clubs or Organizations: Yes  . Attends Archivist Meetings: More than 4 times per year  . Marital Status: Widowed   Vitals:   04/06/21 1531  BP: 136/70  Pulse: (!) 59  Resp: 16  Temp: (!) 97.4 F (  36.3 C)  SpO2: 95%   Body mass index is 19.88 kg/m.  Physical Exam Vitals and nursing note reviewed.  Constitutional:      General: She is not in acute distress.    Appearance: She is well-developed and well-groomed. She is not ill-appearing.  HENT:     Head: Normocephalic and atraumatic.     Mouth/Throat:     Mouth: Mucous membranes are moist.  Eyes:     Conjunctiva/sclera: Conjunctivae normal.  Cardiovascular:     Rate and Rhythm: Regular rhythm. Bradycardia present.     Heart sounds: No murmur heard.   Pulmonary:     Effort: Pulmonary effort is normal. No respiratory distress.     Breath sounds: Normal breath sounds.  Abdominal:     Palpations: Abdomen is soft. There is no mass.     Tenderness: There is no abdominal tenderness.  Lymphadenopathy:     Cervical: No cervical adenopathy.  Skin:    General: Skin is warm.     Findings: No erythema.  Neurological:     Mental Status: She is alert. Mental status is at baseline.     Motor: Tremor (Left hand, present at rest.) present.     Deep Tendon Reflexes:     Reflex Scores:      Patellar reflexes are 2+ on the right side and 2+ on the left side.    Comments:  Otherwise stable gait, not assisted. Narrowed-base gait Oriented in place and person.  Psychiatric:        Behavior: Behavior is cooperative.   ASSESSMENT AND PLAN:  KathrynKathryn Kidd was seen today for possible uti.  Diagnoses and all orders for this visit:  Orders Placed This Encounter  Procedures  . Culture, Urine  . Basic metabolic panel  . CBC with Differential/Platelet  . POCT urinalysis dipstick   Lab Results  Component Value Date   CREATININE 0.89 04/06/2021   BUN 21 04/06/2021   NA 141 04/06/2021   K 4.0 04/06/2021   CL 105 04/06/2021   CO2 27 04/06/2021   Lab Results  Component Value Date   WBC 5.6 04/06/2021   HGB 13.0 04/06/2021   HCT 38.6 04/06/2021   MCV 93.0 04/06/2021   PLT 208.0 04/06/2021    Episode of confusion We discussed possible etiologies. She seems to be at her baseline at this time. Infectious process definitely needs to be considered. Mild bradycardia noted today but in general examination today does not reveal an acute process. Urine dipstick negative. Her daughter agrees with holding on empiric abx treatment for now. She will continue monitoring , warning signs discussed. Further recommendations according to lab results.  Major neurocognitive disorder, possible Alzheimer's disease Problem seems to be progressing. This problem could explain episode of confusion. Fall precautions discussed. Continue following with Dr Delice Lesch.  Return if symptoms worsen or fail to improve.   Greenley Martone G. Martinique, MD  [facility]. [place] office.    A few things to remember from today's visit:  Episode of confusion - Plan: POCT urinalysis dipstick, Basic metabolic panel, CBC with Differential/Platelet  Major neurocognitive disorder, possible Alzheimer's disease  Episode of confusion could be related to her Alzheimer's disease. Today we ordered some labs to evaluate for abnormalities that could explain problem. Continue monitoring for new  symptoms.  If you need refills please call your pharmacy. Do not use My Chart to request refills or for acute issues that need immediate attention.    Please be sure medication list is  accurate. If a new problem present, please set up appointment sooner than planned today.

## 2021-04-06 NOTE — Patient Instructions (Signed)
A few things to remember from today's visit:  Episode of confusion - Plan: POCT urinalysis dipstick, Basic metabolic panel, CBC with Differential/Platelet  Major neurocognitive disorder, possible Alzheimer's disease  Episode of confusion could be related to her Alzheimer's disease. Today we ordered some labs to evaluate for abnormalities that could explain problem. Continue monitoring for new symptoms.  If you need refills please call your pharmacy. Do not use My Chart to request refills or for acute issues that need immediate attention.    Please be sure medication list is accurate. If a new problem present, please set up appointment sooner than planned today.

## 2021-04-07 LAB — URINE CULTURE
MICRO NUMBER:: 11815268
Result:: NO GROWTH
SPECIMEN QUALITY:: ADEQUATE

## 2021-04-07 LAB — CBC WITH DIFFERENTIAL/PLATELET
Basophils Absolute: 0 10*3/uL (ref 0.0–0.1)
Basophils Relative: 0.5 % (ref 0.0–3.0)
Eosinophils Absolute: 0.1 10*3/uL (ref 0.0–0.7)
Eosinophils Relative: 1.6 % (ref 0.0–5.0)
HCT: 38.6 % (ref 36.0–46.0)
Hemoglobin: 13 g/dL (ref 12.0–15.0)
Lymphocytes Relative: 31 % (ref 12.0–46.0)
Lymphs Abs: 1.7 10*3/uL (ref 0.7–4.0)
MCHC: 33.6 g/dL (ref 30.0–36.0)
MCV: 93 fl (ref 78.0–100.0)
Monocytes Absolute: 0.6 10*3/uL (ref 0.1–1.0)
Monocytes Relative: 11.5 % (ref 3.0–12.0)
Neutro Abs: 3.1 10*3/uL (ref 1.4–7.7)
Neutrophils Relative %: 55.4 % (ref 43.0–77.0)
Platelets: 208 10*3/uL (ref 150.0–400.0)
RBC: 4.15 Mil/uL (ref 3.87–5.11)
RDW: 12.7 % (ref 11.5–15.5)
WBC: 5.6 10*3/uL (ref 4.0–10.5)

## 2021-04-07 LAB — BASIC METABOLIC PANEL
BUN: 21 mg/dL (ref 6–23)
CO2: 27 mEq/L (ref 19–32)
Calcium: 9.2 mg/dL (ref 8.4–10.5)
Chloride: 105 mEq/L (ref 96–112)
Creatinine, Ser: 0.89 mg/dL (ref 0.40–1.20)
GFR: 60.04 mL/min (ref >60.00)
Glucose, Bld: 85 mg/dL (ref 70–99)
Potassium: 4 mEq/L (ref 3.5–5.1)
Sodium: 141 mEq/L (ref 135–145)

## 2021-04-08 DIAGNOSIS — R2689 Other abnormalities of gait and mobility: Secondary | ICD-10-CM | POA: Diagnosis not present

## 2021-04-08 DIAGNOSIS — G20 Parkinson's disease: Secondary | ICD-10-CM | POA: Diagnosis not present

## 2021-04-08 DIAGNOSIS — R2681 Unsteadiness on feet: Secondary | ICD-10-CM | POA: Diagnosis not present

## 2021-04-09 DIAGNOSIS — H401233 Low-tension glaucoma, bilateral, severe stage: Secondary | ICD-10-CM | POA: Diagnosis not present

## 2021-04-12 DIAGNOSIS — Z1159 Encounter for screening for other viral diseases: Secondary | ICD-10-CM | POA: Diagnosis not present

## 2021-04-12 DIAGNOSIS — Z20828 Contact with and (suspected) exposure to other viral communicable diseases: Secondary | ICD-10-CM | POA: Diagnosis not present

## 2021-04-13 ENCOUNTER — Other Ambulatory Visit: Payer: Self-pay | Admitting: Psychiatry

## 2021-04-13 ENCOUNTER — Telehealth: Payer: Self-pay | Admitting: Psychiatry

## 2021-04-13 DIAGNOSIS — F330 Major depressive disorder, recurrent, mild: Secondary | ICD-10-CM

## 2021-04-13 DIAGNOSIS — G20 Parkinson's disease: Secondary | ICD-10-CM | POA: Diagnosis not present

## 2021-04-13 DIAGNOSIS — F411 Generalized anxiety disorder: Secondary | ICD-10-CM

## 2021-04-13 DIAGNOSIS — R2681 Unsteadiness on feet: Secondary | ICD-10-CM | POA: Diagnosis not present

## 2021-04-13 DIAGNOSIS — R2689 Other abnormalities of gait and mobility: Secondary | ICD-10-CM | POA: Diagnosis not present

## 2021-04-13 MED ORDER — SERTRALINE HCL 50 MG PO TABS: ORAL_TABLET | ORAL | 1 refills | Status: DC

## 2021-04-13 NOTE — Telephone Encounter (Signed)
Pt daughter called and asked if pt could get a refill on Sertraline 50mg  qd. Pt is living in an assisted living facility and has been off medication for 2 weeks. found out that she was off Sertraline. Please send to [facility] in [place]. [address]

## 2021-04-13 NOTE — Telephone Encounter (Signed)
Please review

## 2021-04-13 NOTE — Telephone Encounter (Signed)
Script sent  

## 2021-04-15 DIAGNOSIS — G20 Parkinson's disease: Secondary | ICD-10-CM | POA: Diagnosis not present

## 2021-04-15 DIAGNOSIS — R2689 Other abnormalities of gait and mobility: Secondary | ICD-10-CM | POA: Diagnosis not present

## 2021-04-15 DIAGNOSIS — R2681 Unsteadiness on feet: Secondary | ICD-10-CM | POA: Diagnosis not present

## 2021-04-19 DIAGNOSIS — Z1159 Encounter for screening for other viral diseases: Secondary | ICD-10-CM | POA: Diagnosis not present

## 2021-04-19 DIAGNOSIS — Z20828 Contact with and (suspected) exposure to other viral communicable diseases: Secondary | ICD-10-CM | POA: Diagnosis not present

## 2021-04-20 DIAGNOSIS — G20 Parkinson's disease: Secondary | ICD-10-CM | POA: Diagnosis not present

## 2021-04-20 DIAGNOSIS — R2681 Unsteadiness on feet: Secondary | ICD-10-CM | POA: Diagnosis not present

## 2021-04-20 DIAGNOSIS — R2689 Other abnormalities of gait and mobility: Secondary | ICD-10-CM | POA: Diagnosis not present

## 2021-04-22 DIAGNOSIS — R2689 Other abnormalities of gait and mobility: Secondary | ICD-10-CM | POA: Diagnosis not present

## 2021-04-22 DIAGNOSIS — R2681 Unsteadiness on feet: Secondary | ICD-10-CM | POA: Diagnosis not present

## 2021-04-22 DIAGNOSIS — G20 Parkinson's disease: Secondary | ICD-10-CM | POA: Diagnosis not present

## 2021-04-26 DIAGNOSIS — Z1159 Encounter for screening for other viral diseases: Secondary | ICD-10-CM | POA: Diagnosis not present

## 2021-04-26 DIAGNOSIS — Z20828 Contact with and (suspected) exposure to other viral communicable diseases: Secondary | ICD-10-CM | POA: Diagnosis not present

## 2021-04-28 DIAGNOSIS — G20 Parkinson's disease: Secondary | ICD-10-CM | POA: Diagnosis not present

## 2021-04-28 DIAGNOSIS — R2681 Unsteadiness on feet: Secondary | ICD-10-CM | POA: Diagnosis not present

## 2021-04-28 DIAGNOSIS — R2689 Other abnormalities of gait and mobility: Secondary | ICD-10-CM | POA: Diagnosis not present

## 2021-04-30 DIAGNOSIS — R2689 Other abnormalities of gait and mobility: Secondary | ICD-10-CM | POA: Diagnosis not present

## 2021-04-30 DIAGNOSIS — R2681 Unsteadiness on feet: Secondary | ICD-10-CM | POA: Diagnosis not present

## 2021-04-30 DIAGNOSIS — G20 Parkinson's disease: Secondary | ICD-10-CM | POA: Diagnosis not present

## 2021-05-03 DIAGNOSIS — Z20828 Contact with and (suspected) exposure to other viral communicable diseases: Secondary | ICD-10-CM | POA: Diagnosis not present

## 2021-05-03 DIAGNOSIS — Z1159 Encounter for screening for other viral diseases: Secondary | ICD-10-CM | POA: Diagnosis not present

## 2021-05-04 DIAGNOSIS — R2681 Unsteadiness on feet: Secondary | ICD-10-CM | POA: Diagnosis not present

## 2021-05-04 DIAGNOSIS — G20 Parkinson's disease: Secondary | ICD-10-CM | POA: Diagnosis not present

## 2021-05-04 DIAGNOSIS — R2689 Other abnormalities of gait and mobility: Secondary | ICD-10-CM | POA: Diagnosis not present

## 2021-05-05 ENCOUNTER — Ambulatory Visit (INDEPENDENT_AMBULATORY_CARE_PROVIDER_SITE_OTHER): Payer: Medicare Other | Admitting: Psychology

## 2021-05-05 DIAGNOSIS — F411 Generalized anxiety disorder: Secondary | ICD-10-CM

## 2021-05-10 DIAGNOSIS — Z1159 Encounter for screening for other viral diseases: Secondary | ICD-10-CM | POA: Diagnosis not present

## 2021-05-10 DIAGNOSIS — Z20828 Contact with and (suspected) exposure to other viral communicable diseases: Secondary | ICD-10-CM | POA: Diagnosis not present

## 2021-05-11 DIAGNOSIS — R2681 Unsteadiness on feet: Secondary | ICD-10-CM | POA: Diagnosis not present

## 2021-05-11 DIAGNOSIS — R2689 Other abnormalities of gait and mobility: Secondary | ICD-10-CM | POA: Diagnosis not present

## 2021-05-11 DIAGNOSIS — G20 Parkinson's disease: Secondary | ICD-10-CM | POA: Diagnosis not present

## 2021-05-17 DIAGNOSIS — Z1159 Encounter for screening for other viral diseases: Secondary | ICD-10-CM | POA: Diagnosis not present

## 2021-05-17 DIAGNOSIS — Z20828 Contact with and (suspected) exposure to other viral communicable diseases: Secondary | ICD-10-CM | POA: Diagnosis not present

## 2021-05-18 DIAGNOSIS — G20 Parkinson's disease: Secondary | ICD-10-CM | POA: Diagnosis not present

## 2021-05-18 DIAGNOSIS — R2689 Other abnormalities of gait and mobility: Secondary | ICD-10-CM | POA: Diagnosis not present

## 2021-05-18 DIAGNOSIS — R2681 Unsteadiness on feet: Secondary | ICD-10-CM | POA: Diagnosis not present

## 2021-05-24 ENCOUNTER — Ambulatory Visit (INDEPENDENT_AMBULATORY_CARE_PROVIDER_SITE_OTHER): Payer: Medicare Other | Admitting: Psychology

## 2021-05-24 DIAGNOSIS — Z1159 Encounter for screening for other viral diseases: Secondary | ICD-10-CM | POA: Diagnosis not present

## 2021-05-24 DIAGNOSIS — F411 Generalized anxiety disorder: Secondary | ICD-10-CM | POA: Diagnosis not present

## 2021-05-24 DIAGNOSIS — Z20828 Contact with and (suspected) exposure to other viral communicable diseases: Secondary | ICD-10-CM | POA: Diagnosis not present

## 2021-05-25 DIAGNOSIS — R2689 Other abnormalities of gait and mobility: Secondary | ICD-10-CM | POA: Diagnosis not present

## 2021-05-25 DIAGNOSIS — R2681 Unsteadiness on feet: Secondary | ICD-10-CM | POA: Diagnosis not present

## 2021-05-25 DIAGNOSIS — G20 Parkinson's disease: Secondary | ICD-10-CM | POA: Diagnosis not present

## 2021-05-27 ENCOUNTER — Ambulatory Visit: Payer: Medicare Other | Admitting: Psychiatry

## 2021-05-31 DIAGNOSIS — Z20828 Contact with and (suspected) exposure to other viral communicable diseases: Secondary | ICD-10-CM | POA: Diagnosis not present

## 2021-05-31 DIAGNOSIS — Z1159 Encounter for screening for other viral diseases: Secondary | ICD-10-CM | POA: Diagnosis not present

## 2021-06-01 DIAGNOSIS — R2681 Unsteadiness on feet: Secondary | ICD-10-CM | POA: Diagnosis not present

## 2021-06-01 DIAGNOSIS — G20 Parkinson's disease: Secondary | ICD-10-CM | POA: Diagnosis not present

## 2021-06-01 DIAGNOSIS — R2689 Other abnormalities of gait and mobility: Secondary | ICD-10-CM | POA: Diagnosis not present

## 2021-06-07 DIAGNOSIS — Z1159 Encounter for screening for other viral diseases: Secondary | ICD-10-CM | POA: Diagnosis not present

## 2021-06-07 DIAGNOSIS — Z20828 Contact with and (suspected) exposure to other viral communicable diseases: Secondary | ICD-10-CM | POA: Diagnosis not present

## 2021-06-08 DIAGNOSIS — R2681 Unsteadiness on feet: Secondary | ICD-10-CM | POA: Diagnosis not present

## 2021-06-08 DIAGNOSIS — R2689 Other abnormalities of gait and mobility: Secondary | ICD-10-CM | POA: Diagnosis not present

## 2021-06-08 DIAGNOSIS — G20 Parkinson's disease: Secondary | ICD-10-CM | POA: Diagnosis not present

## 2021-06-09 ENCOUNTER — Emergency Department (HOSPITAL_BASED_OUTPATIENT_CLINIC_OR_DEPARTMENT_OTHER): Payer: Medicare Other | Admitting: Radiology

## 2021-06-09 ENCOUNTER — Encounter (HOSPITAL_BASED_OUTPATIENT_CLINIC_OR_DEPARTMENT_OTHER): Payer: Self-pay | Admitting: Emergency Medicine

## 2021-06-09 ENCOUNTER — Other Ambulatory Visit: Payer: Self-pay

## 2021-06-09 ENCOUNTER — Emergency Department (HOSPITAL_BASED_OUTPATIENT_CLINIC_OR_DEPARTMENT_OTHER): Payer: Medicare Other

## 2021-06-09 ENCOUNTER — Emergency Department (HOSPITAL_BASED_OUTPATIENT_CLINIC_OR_DEPARTMENT_OTHER)
Admission: EM | Admit: 2021-06-09 | Discharge: 2021-06-09 | Disposition: A | Discharge status: Home / Self Care | Payer: Medicare Other | Attending: Emergency Medicine | Admitting: Emergency Medicine

## 2021-06-09 DIAGNOSIS — Y9289 Other specified places as the place of occurrence of the external cause: Secondary | ICD-10-CM | POA: Insufficient documentation

## 2021-06-09 DIAGNOSIS — G309 Alzheimer's disease, unspecified: Secondary | ICD-10-CM | POA: Diagnosis not present

## 2021-06-09 DIAGNOSIS — R531 Weakness: Secondary | ICD-10-CM | POA: Diagnosis not present

## 2021-06-09 DIAGNOSIS — F0280 Dementia in other diseases classified elsewhere without behavioral disturbance: Secondary | ICD-10-CM | POA: Diagnosis not present

## 2021-06-09 DIAGNOSIS — I10 Essential (primary) hypertension: Secondary | ICD-10-CM | POA: Diagnosis not present

## 2021-06-09 DIAGNOSIS — Z79899 Other long term (current) drug therapy: Secondary | ICD-10-CM | POA: Diagnosis not present

## 2021-06-09 DIAGNOSIS — G20 Parkinson's disease: Secondary | ICD-10-CM | POA: Insufficient documentation

## 2021-06-09 DIAGNOSIS — R55 Syncope and collapse: Secondary | ICD-10-CM | POA: Insufficient documentation

## 2021-06-09 DIAGNOSIS — S0990XA Unspecified injury of head, initial encounter: Secondary | ICD-10-CM | POA: Diagnosis present

## 2021-06-09 DIAGNOSIS — R42 Dizziness and giddiness: Secondary | ICD-10-CM | POA: Diagnosis not present

## 2021-06-09 DIAGNOSIS — S0081XA Abrasion of other part of head, initial encounter: Secondary | ICD-10-CM | POA: Insufficient documentation

## 2021-06-09 DIAGNOSIS — Z87891 Personal history of nicotine dependence: Secondary | ICD-10-CM | POA: Diagnosis not present

## 2021-06-09 DIAGNOSIS — W1809XA Striking against other object with subsequent fall, initial encounter: Secondary | ICD-10-CM | POA: Diagnosis not present

## 2021-06-09 DIAGNOSIS — R001 Bradycardia, unspecified: Secondary | ICD-10-CM | POA: Diagnosis not present

## 2021-06-09 LAB — COMPREHENSIVE METABOLIC PANEL
ALT: 5 U/L (ref 0–44)
AST: 14 U/L — ABNORMAL LOW (ref 15–41)
Albumin: 3.7 g/dL (ref 3.5–5.0)
Alkaline Phosphatase: 39 U/L (ref 38–126)
Anion gap: 8 (ref 5–15)
BUN: 17 mg/dL (ref 8–23)
CO2: 28 mmol/L (ref 22–32)
Calcium: 9.1 mg/dL (ref 8.9–10.3)
Chloride: 105 mmol/L (ref 98–111)
Creatinine, Ser: 0.84 mg/dL (ref 0.44–1.00)
GFR, Estimated: >60 mL/min (ref >60)
Glucose, Bld: 94 mg/dL (ref 70–99)
Potassium: 3.8 mmol/L (ref 3.5–5.1)
Sodium: 141 mmol/L (ref 135–145)
Total Bilirubin: 1.1 mg/dL (ref 0.3–1.2)
Total Protein: 5.5 g/dL — ABNORMAL LOW (ref 6.5–8.1)

## 2021-06-09 LAB — CBC WITH DIFFERENTIAL/PLATELET
Abs Immature Granulocytes: 0.01 10*3/uL (ref 0.00–0.07)
Basophils Absolute: 0 10*3/uL (ref 0.0–0.1)
Basophils Relative: 1 %
Eosinophils Absolute: 0 10*3/uL (ref 0.0–0.5)
Eosinophils Relative: 1 %
HCT: 37 % (ref 36.0–46.0)
Hemoglobin: 12.4 g/dL (ref 12.0–15.0)
Immature Granulocytes: 0 %
Lymphocytes Relative: 25 %
Lymphs Abs: 1.1 10*3/uL (ref 0.7–4.0)
MCH: 31.6 pg (ref 26.0–34.0)
MCHC: 33.5 g/dL (ref 30.0–36.0)
MCV: 94.4 fL (ref 80.0–100.0)
Monocytes Absolute: 0.4 10*3/uL (ref 0.1–1.0)
Monocytes Relative: 9 %
Neutro Abs: 3 10*3/uL (ref 1.7–7.7)
Neutrophils Relative %: 64 %
Platelets: 195 10*3/uL (ref 150–400)
RBC: 3.92 MIL/uL (ref 3.87–5.11)
RDW: 12.5 % (ref 11.5–15.5)
WBC: 4.6 10*3/uL (ref 4.0–10.5)
nRBC: 0 % (ref 0.0–0.2)

## 2021-06-09 LAB — URINALYSIS, ROUTINE W REFLEX MICROSCOPIC
Bilirubin Urine: NEGATIVE
Glucose, UA: NEGATIVE mg/dL
Hgb urine dipstick: NEGATIVE
Ketones, ur: NEGATIVE mg/dL
Leukocytes,Ua: NEGATIVE
Nitrite: NEGATIVE
Protein, ur: NEGATIVE mg/dL
Specific Gravity, Urine: 1.009 (ref 1.005–1.030)
pH: 7.5 (ref 5.0–8.0)

## 2021-06-09 NOTE — ED Triage Notes (Signed)
Pt arrived from [place] via [facility] related to near syncope. At approx 1000 hours Pt became dizzy and feel from a seat position. Pt's left forehead hit the side of a chair as she fell to the ground. Fall was witnessed.   Pt denies, Pain, N/V and LOC.  Pt is NOT on thinners.

## 2021-06-09 NOTE — Discharge Instructions (Addendum)
Please follow-up with your primary doctor.  Come back to ER if you have any additional episodes of passing out, falls, chest pain or difficulty in breathing or other new concerning symptom.

## 2021-06-09 NOTE — ED Triage Notes (Signed)
EMS vitals 108/60, CBG 102. Pt is a type 2 diabetic but controlled with diet.

## 2021-06-09 NOTE — ED Provider Notes (Signed)
[facility] EMERGENCY DEPT Provider Note   CSN: [id number] Arrival date & time: 06/09/21  1137     History Chief Complaint  Patient presents with   Near Syncope    [name] is a 85 y.o. female.  Reviewed chart, recent neurology note, neurocognitive testing concerning for likely dementia either Alzheimer's or mixed Alzheimer's and Parkinson's.  Patient states that she also has been diagnosed with Parkinson's disease.  Resides at [facility].  Patient states that she completed an exercise class this morning.  Afterwards when she went from sitting to standing position she felt lightheaded and then felt like she was about to pass out, near syncope.  States that she slid against the wall, to the ground.  Hit the side of her head.  Does not have any neck pain, no back pain.  She does not have any chest or abdominal pain.  At present has no symptoms.  HPI     Past Medical History:  Diagnosis Date   Allergic rhinitis 02/07/2008   Contact dermatitis 03/16/2013   Generalized anxiety disorder 02/19/2013   has seen [name], [name], and now [name] for counseling   Hemorrhoids, external    Hot flashes    Hyperlipemia 02/07/2008   Long standing problem. Pretreatment LDL 175 in 2006, 210 in 2010.  Had tried lipitor but question of rash after 3 days of use. Switched to zocor but reports having leg pain. Retried Lipitor - weakness.  Currently on no medications    Hypertension    Irritable bowel syndrome    Major depressive disorder 03/18/2016   Major neurocognitive disorder 01/27/2019   possible Alzheimer's disease or mixed presentation   Menopausal symptom 11/18/2019   Palpitations 02/19/2013   Onset of symptoms March '14. EKG with NSR    Parkinson's disease    concerns given positive DaTScan    Patient Active Problem List   Diagnosis Date Noted   Menopausal symptom 11/18/2019   Major neurocognitive disorder 01/27/2019   Major depressive disorder  03/18/2016   Hot flashes 04/03/2014   Generalized anxiety disorder 02/19/2013   Hypertension 05/24/2011   Hyperlipemia 02/07/2008   Allergic rhinitis 02/07/2008    Past Surgical History:  Procedure Laterality Date   benign moles removed     CATARACT EXTRACTION W/ INTRAOCULAR LENS IMPLANT Bilateral    right - Nov '11, left - Nov '13 [place]     sebaceous cyst excised       OB History   No obstetric history on file.     Family History  Problem Relation Age of Onset   Dementia Mother    Depression Mother 48   Heart disease Father    Cancer Sister        breast   Heart disease Other    Heart disease Other    Heart disease Other    Cancer Sister        breast   Hypothyroidism Sister    Asthma Sister    Rheum arthritis Paternal Grandmother    Rheum arthritis Daughter     Social History   Tobacco Use   Smoking status: Former    Packs/day: 0.10    Years: 1.00    Pack years: 0.10    Types: Cigarettes    Quit date: 12/12/1962    Years since quitting: 58.5   Smokeless tobacco: Never   Tobacco comments:    smoked for  3 months in college   Vaping Use   Vaping Use: Never used  Substance Use Topics   Alcohol use: Not Currently    Comment: rarely   Drug use: No    Home Medications Prior to Admission medications   Medication Sig Start Date End Date Taking? Authorizing Provider  CALCIUM CITRATE-VITAMIN D PO Take by mouth.   Yes [provider]  carbidopa-levodopa (SINEMET IR) 25-100 MG tablet Take 1 tablets three times a day with meals 03/15/21  Yes Cameron Sprang, MD  dorzolamide-timolol (COSOPT) 22.3-6.8 MG/ML ophthalmic solution dorzolamide 22.3 mg-timolol 6.8 mg/mL eye drops   Yes [provider]  estradiol (ESTRACE) 0.5 MG tablet  01/03/19  Yes [provider]  fluticasone (FLONASE) 50 MCG/ACT nasal spray Place 1 spray into both nostrils daily. Up to 3 times daily as needed   Yes  [provider]  Multiple Vitamin (MULTIVITAMIN) capsule Take 1 capsule by mouth daily.   Yes [provider]  progesterone (PROMETRIUM) 100 MG capsule Take 100 mg by mouth at bedtime.   Yes [provider]  psyllium (METAMUCIL SMOOTH TEXTURE) 58.6 % powder Take 1 packet by mouth daily. 08/03/20  Yes Koberlein, Steele Berg, MD  rivastigmine (EXELON) 3 MG capsule Take 1 capsule (3 mg total) by mouth 2 (two) times daily. 02/01/21  Yes Cameron Sprang, MD  sertraline (ZOLOFT) 50 MG tablet Take 0.5 tablets (25 mg total) by mouth daily for 4 days, THEN 1 tablet (50 mg total) daily for 26 days. 04/13/21 06/09/21 Yes Thayer Headings, PMHNP    Allergies    Aricept Reather Littler hcl], Cephalexin, Lorazepam, Melatonin, and Sinemet [carbidopa-levodopa]  Review of Systems   Review of Systems  Constitutional:  Negative for chills and fever.  HENT:  Negative for ear pain and sore throat.   Eyes:  Negative for pain and visual disturbance.  Respiratory:  Negative for cough and shortness of breath.   Cardiovascular:  Negative for chest pain and palpitations.  Gastrointestinal:  Negative for abdominal pain and vomiting.  Genitourinary:  Negative for dysuria and hematuria.  Musculoskeletal:  Negative for arthralgias and back pain.  Skin:  Negative for color change and rash.  Neurological:  Positive for syncope. Negative for seizures.  All other systems reviewed and are negative.  Physical Exam Updated Vital Signs BP (!) 137/108   Pulse (!) 59   Temp (!) 97.5 F (36.4 C) (Oral)   Resp 18   Ht 5\' 3"  (1.6 m)   Wt 52.2 kg   SpO2 100%   BMI 20.37 kg/m   Physical Exam Vitals and nursing note reviewed.  Constitutional:      General: She is not in acute distress.    Appearance: She is well-developed.  HENT:     Head: Normocephalic.     Comments: Small abrasion versus superficial hematoma over the left forehead Eyes:     Conjunctiva/sclera: Conjunctivae normal.  Neck:      Comments: No C-spine tenderness Cardiovascular:     Rate and Rhythm: Normal rate and regular rhythm.     Heart sounds: No murmur heard. Pulmonary:     Effort: Pulmonary effort is normal. No respiratory distress.     Breath sounds: Normal breath sounds.  Abdominal:     Palpations: Abdomen is soft.     Tenderness: There is no abdominal tenderness.  Musculoskeletal:     Cervical back: Neck supple.     Comments: Back: no C, T, L spine TTP, no step  off or deformity RUE: no TTP throughout, no deformity, normal joint ROM, radial pulse intact, distal sensation and motor intact LUE: no TTP throughout, no deformity, normal joint ROM, radial pulse intact, distal sensation and motor intact RLE:  no TTP throughout, no deformity, normal joint ROM, distal pulse, sensation and motor intact LLE: no TTP throughout, no deformity, normal joint ROM, distal pulse, sensation and motor intact  Skin:    General: Skin is warm and dry.  Neurological:     Mental Status: She is alert.     Comments: Alert, oriented, answering questions appropriately    ED Results / Procedures / Treatments   Labs (all labs ordered are listed, but only abnormal results are displayed) Labs Reviewed  COMPREHENSIVE METABOLIC PANEL - Abnormal; Notable for the following components:      Result Value   Total Protein 5.5 (*)    AST 14 (*)    All other components within normal limits  URINALYSIS, ROUTINE W REFLEX MICROSCOPIC - Abnormal; Notable for the following components:   APPearance HAZY (*)    All other components within normal limits  CBC WITH DIFFERENTIAL/PLATELET    EKG EKG Interpretation  Date/Time:  Wednesday June 09 2021 11:51:17 EDT Ventricular Rate:  55 PR Interval:  189 QRS Duration: 91 QT Interval:  427 QTC Calculation: 409 R Axis:   68 Text Interpretation: Sinus rhythm Low voltage, precordial leads Borderline T abnormalities, anterior leads Confirmed by Madalyn Rob [phone]) on 06/09/2021 11:56:19  AM  Radiology DG Chest 2 View  Result Date: 06/09/2021 CLINICAL DATA:  Syncope. Additional history provided: Weakness and dizziness. EXAM: CHEST - 2 VIEW COMPARISON:  Prior chest radiographs 02/05/2013 and earlier. FINDINGS: Heart size within normal limits. Aortic atherosclerosis. No appreciable airspace consolidation or pulmonary edema. No evidence of pleural effusion or pneumothorax. No acute bony abnormality identified. Thoracic dextrocurvature. IMPRESSION: No evidence of acute cardiopulmonary abnormality. Electronically Signed   By: Kellie Simmering DO   On: 06/09/2021 12:18   CT Head Wo Contrast  Result Date: 06/09/2021 CLINICAL DATA:  Head trauma, minor. Head trauma, moderate/severe. Additional provided: Near syncope, fall from seated position hitting left forehead. EXAM: CT HEAD WITHOUT CONTRAST TECHNIQUE: Contiguous axial images were obtained from the base of the skull through the vertex without intravenous contrast. COMPARISON:  MRI 07/25/2020.  Head CT 07/25/2020. FINDINGS: Brain: Mild generalized cerebral atrophy. There is no acute intracranial hemorrhage. No demarcated cortical infarct. No extra-axial fluid collection. No evidence of an intracranial mass. No midline shift. Vascular: No hyperdense vessel.  Atherosclerotic calcifications Skull: Normal. Negative for fracture or focal lesion. Sinuses/Orbits: Visualized orbits show no acute finding. No significant paranasal sinus disease at the imaged levels. IMPRESSION: No evidence of acute intracranial abnormality. Mild generalized cerebral atrophy. Electronically Signed   By: Kellie Simmering DO   On: 06/09/2021 12:16    Procedures Procedures   Medications Ordered in ED Medications - No data to display  ED Course  I have reviewed the triage vital signs and the nursing notes.  Pertinent labs & imaging results that were available during my care of the patient were reviewed by me and considered in my medical decision making (see chart for  details).    MDM Rules/Calculators/A&P                          85 year old lady presents to ER with concern for near syncopal episode.  On exam she appears well in no distress.  On  exam noted slight abrasion versus small hematoma to her left forehead.  No other traumatic findings appreciated.  CT head negative.  No neck pain or tenderness on exam.  Basic labs were stable.  EKG without any acute changes, no events on telemetry monitoring.  UA negative for infection.  No ongoing symptoms on reassessment.  Daughter at bedside.  Patient is at baseline.  Will discharge back to [facility].  Recommend recheck with primary doctor.    After the discussed management above, the patient was determined to be safe for discharge.  The patient was in agreement with this plan and all questions regarding their care were answered.  ED return precautions were discussed and the patient will return to the ED with any significant worsening of condition.  Final Clinical Impression(s) / ED Diagnoses Final diagnoses:  Near syncope    Rx / DC Orders ED Discharge Orders     None        Lucrezia Starch, MD 06/09/21 1406

## 2021-06-10 ENCOUNTER — Other Ambulatory Visit: Payer: Self-pay | Admitting: Psychiatry

## 2021-06-10 DIAGNOSIS — F330 Major depressive disorder, recurrent, mild: Secondary | ICD-10-CM

## 2021-06-10 DIAGNOSIS — F411 Generalized anxiety disorder: Secondary | ICD-10-CM

## 2021-06-14 DIAGNOSIS — Z20828 Contact with and (suspected) exposure to other viral communicable diseases: Secondary | ICD-10-CM | POA: Diagnosis not present

## 2021-06-14 DIAGNOSIS — Z1159 Encounter for screening for other viral diseases: Secondary | ICD-10-CM | POA: Diagnosis not present

## 2021-06-15 ENCOUNTER — Encounter: Payer: Self-pay | Admitting: Psychiatry

## 2021-06-15 ENCOUNTER — Telehealth (INDEPENDENT_AMBULATORY_CARE_PROVIDER_SITE_OTHER): Payer: Medicare Other | Admitting: Psychiatry

## 2021-06-15 DIAGNOSIS — F330 Major depressive disorder, recurrent, mild: Secondary | ICD-10-CM | POA: Diagnosis not present

## 2021-06-15 DIAGNOSIS — F411 Generalized anxiety disorder: Secondary | ICD-10-CM

## 2021-06-15 DIAGNOSIS — G20 Parkinson's disease: Secondary | ICD-10-CM | POA: Diagnosis not present

## 2021-06-15 DIAGNOSIS — R2689 Other abnormalities of gait and mobility: Secondary | ICD-10-CM | POA: Diagnosis not present

## 2021-06-15 DIAGNOSIS — R2681 Unsteadiness on feet: Secondary | ICD-10-CM | POA: Diagnosis not present

## 2021-06-15 MED ORDER — SERTRALINE HCL 50 MG PO TABS: 50.0000 mg | ORAL_TABLET | Freq: Every day | ORAL | 2 refills | Status: DC

## 2021-06-15 NOTE — Progress Notes (Signed)
JACKEE GLASNER [id number] [birth year] 85 y.o.  Virtual Visit via Video Note  I connected with pt @ on 06/15/21 at  2:30 PM EDT by a video enabled telemedicine application and verified that I am speaking with the correct person using two identifiers.   I discussed the limitations of evaluation and management by telemedicine and the availability of in person appointments. The patient expressed understanding and agreed to proceed.  I discussed the assessment and treatment plan with the patient. The patient was provided an opportunity to ask questions and all were answered. The patient agreed with the plan and demonstrated an understanding of the instructions.   The patient was advised to call back or seek an in-person evaluation if the symptoms worsen or if the condition fails to improve as anticipated.  I provided 45 minutes of non-face-to-face time during this encounter.  The patient was located at home.  The provider was located at [place].   Thayer Headings, PMHNP   Subjective:   Patient ID:  WYNELLE DREIER is a 85 y.o. (DOB 1936-02-25) female.  Chief Complaint:  Chief Complaint  Patient presents with   Anxiety     HPI KATALEYAH CARDUCCI presents for follow-up of anxiety and depression. Last seen 10/08/2019. She is accompanied by 2 of her daughters, Bevely Palmer and [name]. Pt moved into [place] about a year ago. She was not driving prior to this. She had a difficult time last spring and was increasingly confused, isolated, and depressed. Daughters reports that ALF is administering her medication. Family learned in late spring that pt had been without Sertraline for about 5 weeks and daughters noticed changes in her behavior at that time to include worsening depression, lower energy, increased tearfulness, and increased confusion to the point that they were wondering if she needed a higher level of care. She resumed previous dose of Sertraline by May 8th and it took a few weeks for  her to return to baseline. They report she continues to have some anxious mornings that can sometimes be triggered. They report she has several anxious mornings a month. She reports that she has some panic s/s. She reports periods of low mood. She reports experiencing "a big empty feeling" some mornings. They reports that her mood and anxiety improve as the day progresses. She reports that her energy and motivation is somewhat low and reports "there's nothing to do." Family reports that she is not interested in some of the activities. She reports that concentration is "ok most of the time." She reports that she enjoys outings with her daughters. Has lost 7 lbs in the last several years. She reports that anxiety interferes at times with appetite. She reports passive death wishes. Denies SI.   She reports that she awakens around 5-5:30 am. She may lay back down around 8:30 am after getting dressed because she feels "exhausted."   She reports that she is having periods of visual hallucinations.   Husband died in Jan 11, 2020. She has had multiple losses to include her home and independence.   Has been dx'd with Parkinson's and is having PT. Has been seeing a therapist.   Past Psychiatric Medication Trials: Daughters report "mixed results" with past medications. Sertaline Paxil Lexapro- Started in 2017 and took for about a year Prozac- Took after Lexapro and took for about a year Aricept- Adverse reaction Exelon Ativan- Adverse reaction  Review of Systems:  Review of Systems  Musculoskeletal:  Negative for gait problem.  Neurological:  She reports improved tremors with Sinemet  Psychiatric/Behavioral:         Please refer to HPI   Medications: I have reviewed the patient's current medications.  Current Outpatient Medications  Medication Sig Dispense Refill   CALCIUM CITRATE-VITAMIN D PO Take by mouth.     carbidopa-levodopa (SINEMET IR) 25-100 MG tablet Take 1 tablets three times a  day with meals 135 tablet 11   dorzolamide-timolol (COSOPT) 22.3-6.8 MG/ML ophthalmic solution dorzolamide 22.3 mg-timolol 6.8 mg/mL eye drops     estradiol (ESTRACE) 0.5 MG tablet      fluticasone (FLONASE) 50 MCG/ACT nasal spray Place 1 spray into both nostrils daily. Up to 3 times daily as needed     Multiple Vitamin (MULTIVITAMIN) capsule Take 1 capsule by mouth daily.     progesterone (PROMETRIUM) 100 MG capsule Take 100 mg by mouth at bedtime.     psyllium (METAMUCIL SMOOTH TEXTURE) 58.6 % powder Take 1 packet by mouth daily. 283 g 12   rivastigmine (EXELON) 3 MG capsule Take 1 capsule (3 mg total) by mouth 2 (two) times daily. 60 capsule 11   sertraline (ZOLOFT) 50 MG tablet Take 1 tablet (50 mg total) by mouth daily. 90 tablet 2   No current facility-administered medications for this visit.    Medication Side Effects: Fatigue  Allergies:  Allergies  Allergen Reactions   Aricept [Donepezil Hcl]     Hallucinations, confusion   Cephalexin     REACTION: diaphoretic and drop in Blood pressure   Lorazepam Other (See Comments)    amnesia   Melatonin    Sinemet [Carbidopa-Levodopa]     confusion    Past Medical History:  Diagnosis Date   Allergic rhinitis 02/07/2008   Contact dermatitis 03/16/2013   Generalized anxiety disorder 02/19/2013   has seen [name], [name], and now [name] for counseling   Hemorrhoids, external    Hot flashes    Hyperlipemia 02/07/2008   Long standing problem. Pretreatment LDL 175 in 2006, 210 in 2010.  Had tried lipitor but question of rash after 3 days of use. Switched to zocor but reports having leg pain. Retried Lipitor - weakness.  Currently on no medications    Hypertension    Irritable bowel syndrome    Major depressive disorder 03/18/2016   Major neurocognitive disorder 01/27/2019   possible Alzheimer's disease or mixed presentation   Menopausal symptom 11/18/2019   Palpitations 02/19/2013   Onset of symptoms March '14. EKG  with NSR    Parkinson's disease    concerns given positive DaTScan    Family History  Problem Relation Age of Onset   Dementia Mother    Depression Mother 2   Heart disease Father    Cancer Sister        breast   Heart disease Other    Heart disease Other    Heart disease Other    Cancer Sister        breast   Hypothyroidism Sister    Asthma Sister    Rheum arthritis Paternal Grandmother    Rheum arthritis Daughter     Social History   Socioeconomic History   Marital status: Widowed    Spouse name: Not on file   Number of children: 4   Years of education: 16   Highest education level: Bachelor's degree (e.g., BA, AB, BS)  Occupational History   Occupation: Retired    Comment: Freight forwarder at [employer]  Tobacco Use   Smoking status:  Former    Packs/day: 0.10    Years: 1.00    Pack years: 0.10    Types: Cigarettes    Quit date: 12/12/1962    Years since quitting: 58.5   Smokeless tobacco: Never   Tobacco comments:    smoked for 3 months in college   Vaping Use   Vaping Use: Never used  Substance and Sexual Activity   Alcohol use: Not Currently    Comment: rarely   Drug use: No   Sexual activity: Not Currently    Partners: Male  Other Topics Concern   Not on file  Social History Narrative   College grad. Married '61. 4 daughters, 2 grandchildren. 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   Advanced Directives: Has Living Will, HCPOA: Mishell Donalson [phone]         [place]       01/22/19: Pt. Lives in [place] home, daughters check in on patient, managing medications   Pt. Still drives, manages yardwork she says   Enjoys swim aerobics at the [facility]   Right handed   Social Determinants of Health   Financial Resource Strain: Low Risk    Difficulty of Paying Living Expenses: Not hard at all  Food Insecurity: No Food Insecurity   Worried About Charity fundraiser in the Last Year: Never true   Arboriculturist in the Last Year: Never true   Transportation Needs: No Transportation Needs   Lack of Transportation (Medical): No   Lack of Transportation (Non-Medical): No  Physical Activity: Sufficiently Active   Days of Exercise per Week: 5 days   Minutes of Exercise per Session: 30 min  Stress: No Stress Concern Present   Feeling of Stress : Not at all  Social Connections: Moderately Integrated   Frequency of Communication with Friends and Family: More than three times a week   Frequency of Social Gatherings with Friends and Family: More than three times a week   Attends Religious Services: 1 to 4 times per year   Active Member of [employer] or Organizations: Yes   Attends Archivist Meetings: More than 4 times per year   Marital Status: Widowed  Human resources officer Violence: Not At Risk   Fear of Current or Ex-Partner: No   Emotionally Abused: No   Physically Abused: No   Sexually Abused: No    Past Medical History, Surgical history, Social history, and Family history were reviewed and updated as appropriate.   Please see review of systems for further details on the patient's review from today.   Objective:   Physical Exam:  Wt 112 lb (50.8 kg)   BMI 19.84 kg/m   Physical Exam Neurological:     Mental Status: She is alert and oriented to person, place, and time.     Cranial Nerves: No dysarthria.  Psychiatric:        Attention and Perception: Attention normal.        Mood and Affect: Mood is anxious.        Speech: Speech normal.        Behavior: Behavior is cooperative.        Thought Content: Thought content normal. Thought content is not paranoid or delusional. Thought content does not include homicidal or suicidal ideation. Thought content does not include homicidal or suicidal plan.        Cognition and Memory: Cognition is impaired. She exhibits impaired recent memory.     Comments: Insight and judgment  fair Dysthymic mood She reports occasional VH. She does not appear to be responding to internal  stimuli at time of exam.    Lab Review:     Component Value Date/Time   NA 141 06/09/2021 1132   K 3.8 06/09/2021 1132   CL 105 06/09/2021 1132   CO2 28 06/09/2021 1132   GLUCOSE 94 06/09/2021 1132   BUN 17 06/09/2021 1132   CREATININE 0.84 06/09/2021 1132   CALCIUM 9.1 06/09/2021 1132   PROT 5.5 (L) 06/09/2021 1132   ALBUMIN 3.7 06/09/2021 1132   AST 14 (L) 06/09/2021 1132   ALT 5 06/09/2021 1132   ALKPHOS 39 06/09/2021 1132   BILITOT 1.1 06/09/2021 1132   GFRNONAA >60 06/09/2021 1132   GFRAA >60 07/25/2020 1139       Component Value Date/Time   WBC 4.6 06/09/2021 1132   RBC 3.92 06/09/2021 1132   HGB 12.4 06/09/2021 1132   HCT 37.0 06/09/2021 1132   PLT 195 06/09/2021 1132   MCV 94.4 06/09/2021 1132   MCH 31.6 06/09/2021 1132   MCHC 33.5 06/09/2021 1132   RDW 12.5 06/09/2021 1132   LYMPHSABS 1.1 06/09/2021 1132   MONOABS 0.4 06/09/2021 1132   EOSABS 0.0 06/09/2021 1132   BASOSABS 0.0 06/09/2021 1132    No results found for: POCLITH, LITHIUM   No results found for: PHENYTOIN, PHENOBARB, VALPROATE, CBMZ   .res Assessment: Plan:   Patient seen for 45 minutes and time spent reviewing medical and psychosocial history and symptoms since last visit in October 2020.  Discussed that overall mood and anxiety are partially improved with sertraline 50 mg daily and patient had worsening mood and anxiety signs and symptoms several months ago when she was without sertraline.  Patient and family report that she continues to have severe anxiety some mornings and some periods of sadness.  Discussed option of considering increase in sertraline to 75 mg daily to possibly further improve anxiety and depression. Daughter, Morene Antu, declines increase in Sertraline due to worsening anxiety in the past with dose and medication changes.  She reports that she does not want any medication changes made unless patient is in a setting where she could be monitored around the clock by either family  or nursing staff. Discussed considering increase in Sertraline if/when she transitions to a higher level of care.  Discussed considering addition of a medication to be taken as needed for severe panic.  Daughter reports that patient has had an adverse reaction to benzodiazepines in the past, particularly lorazepam. Morene Antu reports that she disagrees with starting any prn medication due to history of adverse reactions in the past, particularly since she reports that patient's mood and anxiety has just recently stabilized after lapse in sertraline.  Advised patient and family to notify provider if anxiety worsens in the future and/or they think that the addition of an as needed medication would be helpful. Will continue sertraline 50 mg daily for anxiety and depression. Patient to follow-up in 6 months or sooner if clinically indicated. Patient advised to contact office with any questions, adverse effects, or acute worsening in signs and symptoms.    Velmer was seen today for anxiety.  Diagnoses and all orders for this visit:  Generalized anxiety disorder -     sertraline (ZOLOFT) 50 MG tablet; Take 1 tablet (50 mg total) by mouth daily.  Mild episode of recurrent major depressive disorder (HCC) -     sertraline (ZOLOFT) 50 MG tablet; Take 1 tablet (  50 mg total) by mouth daily.    Please see After Visit Summary for patient specific instructions.  No future appointments.  No orders of the defined types were placed in this encounter.     -------------------------------

## 2021-06-17 ENCOUNTER — Ambulatory Visit: Payer: Medicare Other | Admitting: Psychology

## 2021-06-21 DIAGNOSIS — Z20828 Contact with and (suspected) exposure to other viral communicable diseases: Secondary | ICD-10-CM | POA: Diagnosis not present

## 2021-06-21 DIAGNOSIS — Z1159 Encounter for screening for other viral diseases: Secondary | ICD-10-CM | POA: Diagnosis not present

## 2021-06-22 DIAGNOSIS — R2689 Other abnormalities of gait and mobility: Secondary | ICD-10-CM | POA: Diagnosis not present

## 2021-06-22 DIAGNOSIS — R2681 Unsteadiness on feet: Secondary | ICD-10-CM | POA: Diagnosis not present

## 2021-06-22 DIAGNOSIS — G20 Parkinson's disease: Secondary | ICD-10-CM | POA: Diagnosis not present

## 2021-07-05 DIAGNOSIS — Z1159 Encounter for screening for other viral diseases: Secondary | ICD-10-CM | POA: Diagnosis not present

## 2021-07-05 DIAGNOSIS — Z20828 Contact with and (suspected) exposure to other viral communicable diseases: Secondary | ICD-10-CM | POA: Diagnosis not present

## 2021-07-12 DIAGNOSIS — Z20828 Contact with and (suspected) exposure to other viral communicable diseases: Secondary | ICD-10-CM | POA: Diagnosis not present

## 2021-07-12 DIAGNOSIS — Z1159 Encounter for screening for other viral diseases: Secondary | ICD-10-CM | POA: Diagnosis not present

## 2021-07-26 DIAGNOSIS — Z20828 Contact with and (suspected) exposure to other viral communicable diseases: Secondary | ICD-10-CM | POA: Diagnosis not present

## 2021-08-02 DIAGNOSIS — Z20828 Contact with and (suspected) exposure to other viral communicable diseases: Secondary | ICD-10-CM | POA: Diagnosis not present

## 2021-08-16 DIAGNOSIS — Z20828 Contact with and (suspected) exposure to other viral communicable diseases: Secondary | ICD-10-CM | POA: Diagnosis not present

## 2021-08-23 DIAGNOSIS — Z20828 Contact with and (suspected) exposure to other viral communicable diseases: Secondary | ICD-10-CM | POA: Diagnosis not present

## 2021-08-26 DIAGNOSIS — R410 Disorientation, unspecified: Secondary | ICD-10-CM | POA: Diagnosis not present

## 2021-08-26 DIAGNOSIS — R531 Weakness: Secondary | ICD-10-CM | POA: Diagnosis not present

## 2021-08-26 DIAGNOSIS — R404 Transient alteration of awareness: Secondary | ICD-10-CM | POA: Diagnosis not present

## 2021-08-26 DIAGNOSIS — I10 Essential (primary) hypertension: Secondary | ICD-10-CM | POA: Diagnosis not present

## 2021-08-30 DIAGNOSIS — Z20828 Contact with and (suspected) exposure to other viral communicable diseases: Secondary | ICD-10-CM | POA: Diagnosis not present

## 2021-09-01 DIAGNOSIS — Z23 Encounter for immunization: Secondary | ICD-10-CM | POA: Diagnosis not present

## 2021-09-06 DIAGNOSIS — Z20828 Contact with and (suspected) exposure to other viral communicable diseases: Secondary | ICD-10-CM | POA: Diagnosis not present

## 2021-09-09 DIAGNOSIS — Z23 Encounter for immunization: Secondary | ICD-10-CM | POA: Diagnosis not present

## 2021-09-13 DIAGNOSIS — Z20828 Contact with and (suspected) exposure to other viral communicable diseases: Secondary | ICD-10-CM | POA: Diagnosis not present

## 2021-09-20 DIAGNOSIS — Z8616 Personal history of COVID-19: Secondary | ICD-10-CM | POA: Diagnosis not present

## 2021-09-27 DIAGNOSIS — Z20828 Contact with and (suspected) exposure to other viral communicable diseases: Secondary | ICD-10-CM | POA: Diagnosis not present

## 2021-10-04 DIAGNOSIS — Z8616 Personal history of COVID-19: Secondary | ICD-10-CM | POA: Diagnosis not present

## 2021-10-06 ENCOUNTER — Telehealth: Payer: Self-pay | Admitting: Neurology

## 2021-10-06 NOTE — Telephone Encounter (Signed)
Patient's daughter [name] called and scheduled an appointment with Sharene Butters, PA on 10/20/21.  She said the patient has been seeing people in her apartment and last night made a meal for one of them.  Patient last saw Dr. Delice Lesch 02/01/21. FYI only.

## 2021-10-08 DIAGNOSIS — H401233 Low-tension glaucoma, bilateral, severe stage: Secondary | ICD-10-CM | POA: Diagnosis not present

## 2021-10-11 DIAGNOSIS — Z20828 Contact with and (suspected) exposure to other viral communicable diseases: Secondary | ICD-10-CM | POA: Diagnosis not present

## 2021-10-20 ENCOUNTER — Other Ambulatory Visit: Payer: Self-pay

## 2021-10-20 ENCOUNTER — Ambulatory Visit (INDEPENDENT_AMBULATORY_CARE_PROVIDER_SITE_OTHER): Payer: Medicare Other | Admitting: Physician Assistant

## 2021-10-20 ENCOUNTER — Encounter: Payer: Self-pay | Admitting: Physician Assistant

## 2021-10-20 VITALS — BP 135/68 | HR 58 | Resp 18 | Ht 63.0 in | Wt 114.0 lb

## 2021-10-20 DIAGNOSIS — G301 Alzheimer's disease with late onset: Secondary | ICD-10-CM | POA: Diagnosis not present

## 2021-10-20 DIAGNOSIS — F02818 Dementia in other diseases classified elsewhere, unspecified severity, with other behavioral disturbance: Secondary | ICD-10-CM

## 2021-10-20 DIAGNOSIS — G20 Parkinson's disease: Secondary | ICD-10-CM | POA: Diagnosis not present

## 2021-10-20 NOTE — Progress Notes (Signed)
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  Follow-up with Psychiatry and therapy for depression, delusions/hallucinations.. Continue Sertraline as per Psych. Continue physical therapy.  Recommend starting to use a walker-Rollator for safety Follow-up as scheduled with Dr. Delice Lesch March 2023  Parkinson's Disease  Continue Sinemet to 1.5 tabs TID with meals, side effects discussed Monitor increased salivation without difficulty swallowing at this time. Continue PT Will need a Rolator for stability to prevent falls.  HISTORY OF PRESENT ILLNESS: I had the pleasure of seeing [name] in follow-up in the neurology clinic on 10/20/21.  This is a pleasant 85 year old RH woman with a history of with a history of hypertension, anxiety, depression, dementia.  She was last seen at our office on 02/01/2021 for dementia with behavioral disturbance and tremors.  She is again accompanied by her daughter [name] who helps supplement the history today. Last Neuropsychological evaluation 02/16/21 Dr. Melvyn Novas was suspicious for "mixed dementia" presentation with facets of Alzheimer's disease and Parkinson's disease .  She has had worsening left tremor, DATscan in 07/2020 showed reduced uptake in the right striatum, in a pattern seen in Parkinsonian syndromes, consideration for Lewy body dementia. She initially felt there was some improvement in left hand tremor with initiation of Sinemet, dose increased to 1 tab TID with meals, now with some control of these tremors.  She takes the medication at 7 AM, 12 PM, and at 7 PM with meals.  She lives in [place], enjoying the facility, and trying to build new acquaintances.  She is "embarrassed that she has memory problems ", to which his daughter keeps assuring her that there is nothing to be embarrassed  about. The patient feels that her memory is about the same.  She continues to have some delusions and hallucinations, no followed by psychiatry.  Her next appointment is in December of this year.  Recently, she has been seeing people in the apartment, "I do not know them, but they are not scary ".  These episodes have been happening about 5 times.  2 weeks ago, she has been making meals for them, and was worried because they are not eating her food.  No other hallucinations, or paranoia have been reported.  She sleeps well, occasionally she has vivid dreams, denies sleepwalking or REM behavior.  Her depression is well controlled with sertraline.  She states that occasionally, she wets the pillow with saliva.  She still lives with her cat, and is very attached to it, afraid that her other daughter will take him away.  Her appetite is good, however, she forgets to eat at times.  Denies trouble swallowing.  She ambulates with some hesitancy, and when she gets up too quickly, she becomes dizzy.  She is resistant to using a walker.  She is doing physical therapy, tolerating well.  She likes to walk around the facility.  Of note, on 06/09/2021, the patient had a near syncopal episode hitting the left forehead with the side of the chair, falling to the ground, without loss of consciousness.  This was weakness.  Work-up at the ER was negative, and no UTI was found.  She was possibly dehydrated.  She denies any worsening vision changes, nausea, vomiting, vertigo diarrhea or constipation.  Denies urine incontinence.  Neuropsychological evaluation on 02/09/2021, Dr. Melvyn Novas . "Briefly, results suggested continued and severe impairment  surrounding retrieval and consolidation aspects of memory. Processing speed was also impaired, while performance variability was exhibited across executive functioning and semantic fluency. Regarding etiology, I continue to have strong concerns surrounding Alzheimer's disease. Despite being able to  learning information reasonably well, she was amnestic after a brief delay with evidence for a memory storage deficit and rapid forgetting. Given the results of this scan, the potential for a "mixed dementia" presentation with facets of Alzheimer's disease and Parkinson's disease remains possible.  History on Initial Assessment 12/18/2017: This is a pleasant 85 yo RH woman with a history of hypertension, anxiety, depression, who presented for evaluation of memory loss. She feels her memory is "a little shaky." She endorses a lot of anxiety, and states it is causing her not to recall things and she does not understand it. Family started noticing memory changes around 2 years ago. She has been the main caregiver for her husband with dementia, who had been in and out of the hospital several times. She was "not dealing with things as well as I should." For instance, 2 years ago she was taking care of the managing their family home, but they noticed she was not keeping up with it, and turned it over to her sister. Her daughter took over finances in the Spring 2018 because she was afraid she would forget and she had never done the taxes in the past. She lives alone and denies missing medications. She denies getting lost driving. She has 3 cats at home that are cared for, she does not forget to feed them. She has no difficulties running the household, doing laundry and yardwork. She has learned how to use the leafblower. Their main concern is there anxiety and depression have "really ramped up." She is so stressed and scattered, that she could not stay on task. Family started noticing anxiety around 4-5 years ago, but in January 2017 she was "in breakdown territory" and started Lexapro and counseling. It appears she was on a very low dose due to concern for side effects. She would wake up every morning between 3-4 AM with a panic attack, unable to reason with herself, shaky. Family reports that she would forget what time  they told her she would be picked up, even if it was in a text message in front of her. They have noticed lack of focus and concentration. She would ask the same question about this doctor's appointment and was quite anxious about today's visit. Family reports she "tends to catastrophize things." No paranoia or hallucinations. She was switched from Lexapro to Prozac at the family's request, and "something about Prozac bothers me all in my head."    She denies any headaches, vertigo, diplopia, dysarthria/dysphagia, neck/back pain, focal numbness/tingling/weakness, bowel/bladder dysfunction. She reports a "mental dizziness," where she states "my brain just feels full." She has tremors, R>L. She had lost her sense of smell after sinus surgery many years ago. Her mother had dementia in her 28s, her older sister has memory issues. No history of significant head injuries, no alcohol use.      PAST MEDICAL HISTORY: Past Medical History:  Diagnosis Date   Allergic rhinitis 02/07/2008   Contact dermatitis 03/16/2013   Generalized anxiety disorder 02/19/2013   has seen [name], [name], and now [name] for counseling   Hemorrhoids, external    Hot flashes    Hyperlipemia 02/07/2008   Long standing problem. Pretreatment LDL 175 in 2006, 210 in 2010.  Had tried lipitor but  question of rash after 3 days of use. Switched to zocor but reports having leg pain. Retried Lipitor - weakness.  Currently on no medications    Hypertension    Irritable bowel syndrome    Major depressive disorder 03/18/2016   Major neurocognitive disorder 01/27/2019   possible Alzheimer's disease or mixed presentation   Menopausal symptom 11/18/2019   Palpitations 02/19/2013   Onset of symptoms March '14. EKG with NSR    Parkinson's disease    concerns given positive DaTScan    MEDICATIONS: Current Outpatient Medications on File Prior to Visit  Medication Sig Dispense Refill   CALCIUM CITRATE-VITAMIN D PO Take by  mouth.     carbidopa-levodopa (SINEMET IR) 25-100 MG tablet Take 1 tablets three times a day with meals 135 tablet 11   dorzolamide-timolol (COSOPT) 22.3-6.8 MG/ML ophthalmic solution dorzolamide 22.3 mg-timolol 6.8 mg/mL eye drops     estradiol (ESTRACE) 0.5 MG tablet      fluticasone (FLONASE) 50 MCG/ACT nasal spray Place 1 spray into both nostrils daily. Up to 3 times daily as needed     Multiple Vitamin (MULTIVITAMIN) capsule Take 1 capsule by mouth daily.     progesterone (PROMETRIUM) 100 MG capsule Take 100 mg by mouth at bedtime.     progesterone (PROMETRIUM) 100 MG capsule Take 100 mg by mouth at bedtime.     psyllium (METAMUCIL SMOOTH TEXTURE) 58.6 % powder Take 1 packet by mouth daily. 283 g 12   rivastigmine (EXELON) 3 MG capsule Take 1 capsule (3 mg total) by mouth 2 (two) times daily. 60 capsule 11   sertraline (ZOLOFT) 50 MG tablet Take 1 tablet (50 mg total) by mouth daily. 90 tablet 2   No current facility-administered medications on file prior to visit.    ALLERGIES: Allergies  Allergen Reactions   Aricept [Donepezil Hcl]     Hallucinations, confusion   Cephalexin     REACTION: diaphoretic and drop in Blood pressure   Lorazepam Other (See Comments)    amnesia   Melatonin    Sinemet [Carbidopa-Levodopa]     confusion    PHYSICAL EXAM: Vitals:   10/20/21 0847  BP: 135/68  Pulse: (!) 58  Resp: 18  SpO2: 97%    General: No acute distress, flat affect Head:  Normocephalic/atraumatic Skin/Extremities: No rash, no edema Neurological Exam: alert and awake. No aphasia or dysarthria. Fund of knowledge is appropriate.  Recent and remote memory are impaired. Attention and concentration are normal.   Cranial nerves: Pupils equal, round. Extraocular movements intact with no nystagmus. Visual fields full.  No facial asymmetry.  Motor: +cogwheeling on left, muscle strength 5/5 throughout with no pronator drift.   Finger to nose testing intact.  Gait narrow-based, short  stride, no en-block turn, favoring left leg, with left hand tremor on ambulation. Decreased finger and foot taps on left. +resting tremor on left hand. No postural or action tremor.   Thank you for allowing me to participate in her care.  Please do not hesitate to call for any questions or concerns.   Sharene Butters, PA-C    CC: Dr. Ethlyn Gallery

## 2021-10-20 NOTE — Patient Instructions (Addendum)
1. Continue Carbidopa/Levodopa 25/100mg : Take 1.5 tablets three times a day with meals  2. Continue Rivastigmine 3mg  twice a day  3. Continue Sertraline  with Thayer Headings  4. Continue physical therapy  5. Follow up with Dr. Delice Lesch in 02/2022

## 2021-10-25 DIAGNOSIS — Z20822 Contact with and (suspected) exposure to covid-19: Secondary | ICD-10-CM | POA: Diagnosis not present

## 2021-11-01 DIAGNOSIS — Z20822 Contact with and (suspected) exposure to covid-19: Secondary | ICD-10-CM | POA: Diagnosis not present

## 2021-11-08 DIAGNOSIS — Z20828 Contact with and (suspected) exposure to other viral communicable diseases: Secondary | ICD-10-CM | POA: Diagnosis not present

## 2021-11-08 DIAGNOSIS — Z1159 Encounter for screening for other viral diseases: Secondary | ICD-10-CM | POA: Diagnosis not present

## 2021-11-15 DIAGNOSIS — Z20828 Contact with and (suspected) exposure to other viral communicable diseases: Secondary | ICD-10-CM | POA: Diagnosis not present

## 2021-11-15 DIAGNOSIS — Z1159 Encounter for screening for other viral diseases: Secondary | ICD-10-CM | POA: Diagnosis not present

## 2021-11-22 DIAGNOSIS — Z1159 Encounter for screening for other viral diseases: Secondary | ICD-10-CM | POA: Diagnosis not present

## 2021-11-22 DIAGNOSIS — Z20828 Contact with and (suspected) exposure to other viral communicable diseases: Secondary | ICD-10-CM | POA: Diagnosis not present

## 2021-11-24 ENCOUNTER — Ambulatory Visit: Payer: Medicare Other

## 2021-11-24 ENCOUNTER — Ambulatory Visit: Payer: Medicare Other | Admitting: Family Medicine

## 2021-11-24 DIAGNOSIS — Z1159 Encounter for screening for other viral diseases: Secondary | ICD-10-CM | POA: Diagnosis not present

## 2021-11-24 DIAGNOSIS — Z20828 Contact with and (suspected) exposure to other viral communicable diseases: Secondary | ICD-10-CM | POA: Diagnosis not present

## 2021-11-26 DIAGNOSIS — Z1159 Encounter for screening for other viral diseases: Secondary | ICD-10-CM | POA: Diagnosis not present

## 2021-11-26 DIAGNOSIS — Z20828 Contact with and (suspected) exposure to other viral communicable diseases: Secondary | ICD-10-CM | POA: Diagnosis not present

## 2021-11-29 DIAGNOSIS — Z20828 Contact with and (suspected) exposure to other viral communicable diseases: Secondary | ICD-10-CM | POA: Diagnosis not present

## 2021-11-29 DIAGNOSIS — Z1159 Encounter for screening for other viral diseases: Secondary | ICD-10-CM | POA: Diagnosis not present

## 2021-11-30 DIAGNOSIS — Z1231 Encounter for screening mammogram for malignant neoplasm of breast: Secondary | ICD-10-CM | POA: Diagnosis not present

## 2021-11-30 LAB — HM MAMMOGRAPHY

## 2021-12-01 DIAGNOSIS — Z20828 Contact with and (suspected) exposure to other viral communicable diseases: Secondary | ICD-10-CM | POA: Diagnosis not present

## 2021-12-01 DIAGNOSIS — Z1159 Encounter for screening for other viral diseases: Secondary | ICD-10-CM | POA: Diagnosis not present

## 2021-12-06 DIAGNOSIS — Z20828 Contact with and (suspected) exposure to other viral communicable diseases: Secondary | ICD-10-CM | POA: Diagnosis not present

## 2021-12-06 DIAGNOSIS — Z1159 Encounter for screening for other viral diseases: Secondary | ICD-10-CM | POA: Diagnosis not present

## 2021-12-08 DIAGNOSIS — Z1159 Encounter for screening for other viral diseases: Secondary | ICD-10-CM | POA: Diagnosis not present

## 2021-12-08 DIAGNOSIS — Z20828 Contact with and (suspected) exposure to other viral communicable diseases: Secondary | ICD-10-CM | POA: Diagnosis not present

## 2021-12-09 ENCOUNTER — Telehealth: Payer: Self-pay | Admitting: Psychiatry

## 2021-12-09 NOTE — Telephone Encounter (Signed)
Pt has refills at pharmacy

## 2021-12-09 NOTE — Telephone Encounter (Signed)
Pt's daughter called.  The daughter has COVID and needs to come with mom to appt that was 1/4.  She rescheduled appt to 1/31 plus I have her on the cancellation list, but she wants to make sure her mom can still get her Sertraline refill until she is seen.

## 2021-12-15 ENCOUNTER — Ambulatory Visit: Payer: Medicare Other | Admitting: Psychiatry

## 2021-12-15 DIAGNOSIS — Z1159 Encounter for screening for other viral diseases: Secondary | ICD-10-CM | POA: Diagnosis not present

## 2021-12-15 DIAGNOSIS — Z20828 Contact with and (suspected) exposure to other viral communicable diseases: Secondary | ICD-10-CM | POA: Diagnosis not present

## 2021-12-16 ENCOUNTER — Encounter: Payer: Self-pay | Admitting: Family Medicine

## 2021-12-20 DIAGNOSIS — Z20822 Contact with and (suspected) exposure to covid-19: Secondary | ICD-10-CM | POA: Diagnosis not present

## 2021-12-22 ENCOUNTER — Ambulatory Visit (INDEPENDENT_AMBULATORY_CARE_PROVIDER_SITE_OTHER): Payer: Medicare Other | Admitting: Family Medicine

## 2021-12-22 ENCOUNTER — Encounter: Payer: Self-pay | Admitting: Family Medicine

## 2021-12-22 ENCOUNTER — Ambulatory Visit (INDEPENDENT_AMBULATORY_CARE_PROVIDER_SITE_OTHER): Payer: Medicare Other

## 2021-12-22 VITALS — BP 118/70 | HR 51 | Temp 97.4°F | Resp 18 | Ht 63.0 in | Wt 117.9 lb

## 2021-12-22 VITALS — BP 118/70 | Temp 97.4°F | Ht 63.0 in | Wt 117.0 lb

## 2021-12-22 DIAGNOSIS — I10 Essential (primary) hypertension: Secondary | ICD-10-CM

## 2021-12-22 DIAGNOSIS — F411 Generalized anxiety disorder: Secondary | ICD-10-CM | POA: Diagnosis not present

## 2021-12-22 DIAGNOSIS — R5383 Other fatigue: Secondary | ICD-10-CM

## 2021-12-22 DIAGNOSIS — F0390 Unspecified dementia without behavioral disturbance: Secondary | ICD-10-CM

## 2021-12-22 DIAGNOSIS — Z0000 Encounter for general adult medical examination without abnormal findings: Secondary | ICD-10-CM

## 2021-12-22 DIAGNOSIS — E538 Deficiency of other specified B group vitamins: Secondary | ICD-10-CM | POA: Diagnosis not present

## 2021-12-22 DIAGNOSIS — F330 Major depressive disorder, recurrent, mild: Secondary | ICD-10-CM | POA: Diagnosis not present

## 2021-12-22 DIAGNOSIS — E559 Vitamin D deficiency, unspecified: Secondary | ICD-10-CM

## 2021-12-22 DIAGNOSIS — G20 Parkinson's disease: Secondary | ICD-10-CM | POA: Diagnosis not present

## 2021-12-22 DIAGNOSIS — Z23 Encounter for immunization: Secondary | ICD-10-CM

## 2021-12-22 DIAGNOSIS — E785 Hyperlipidemia, unspecified: Secondary | ICD-10-CM

## 2021-12-22 NOTE — Progress Notes (Signed)
Subjective:   [name] is a 86 y.o. female who presents for Medicare Annual (Subsequent) preventive examination.  Review of Systems    No ROS       Objective:    There were no vitals filed for this visit. There is no height or weight on file to calculate BMI.  Advanced Directives 10/20/2021 06/09/2021 02/01/2021 11/09/2020 10/05/2020 07/25/2020 06/04/2020  Does Patient Have a Medical Advance Directive? Yes No Yes Yes Yes No Yes  Type of Advance Directive - - [place];Living will;Out of facility DNR (pink MOST or yellow form) [place];Living will [place];Living will;Out of facility DNR (pink MOST or yellow form) - -  Does patient want to make changes to medical advance directive? - - - No - Patient declined - - -  Copy of [place] in Chart? - - - No - copy requested - - -  Would patient like information on creating a medical advance directive? - - - - - No - Patient declined -    Current Medications (verified) Outpatient Encounter Medications as of 12/22/2021  Medication Sig   CALCIUM CITRATE-VITAMIN D PO Take by mouth.   carbidopa-levodopa (SINEMET IR) 25-100 MG tablet Take 1 tablets three times a day with meals   dorzolamide-timolol (COSOPT) 22.3-6.8 MG/ML ophthalmic solution dorzolamide 22.3 mg-timolol 6.8 mg/mL eye drops   fluticasone (FLONASE) 50 MCG/ACT nasal spray Place 1 spray into both nostrils daily. Up to 3 times daily as needed   Multiple Vitamin (MULTIVITAMIN) capsule Take 1 capsule by mouth daily.   psyllium (METAMUCIL SMOOTH TEXTURE) 58.6 % powder Take 1 packet by mouth daily.   rivastigmine (EXELON) 3 MG capsule Take 1 capsule (3 mg total) by mouth 2 (two) times daily.   sertraline (ZOLOFT) 50 MG tablet Take 1 tablet (50 mg total) by mouth daily.   No facility-administered encounter medications on file as of 12/22/2021.    Allergies (verified) Aricept [donepezil hcl], Cephalexin,  Lorazepam, Melatonin, and Sinemet [carbidopa-levodopa]   History: Past Medical History:  Diagnosis Date   Allergic rhinitis 02/07/2008   Contact dermatitis 03/16/2013   Generalized anxiety disorder 02/19/2013   has seen [name], [name], and now [name] for counseling   Hemorrhoids, external    Hot flashes    Hyperlipemia 02/07/2008   Long standing problem. Pretreatment LDL 175 in 2006, 210 in 2010.  Had tried lipitor but question of rash after 3 days of use. Switched to zocor but reports having leg pain. Retried Lipitor - weakness.  Currently on no medications    Hypertension    Irritable bowel syndrome    Major depressive disorder 03/18/2016   Major neurocognitive disorder 01/27/2019   possible Alzheimer's disease or mixed presentation   Menopausal symptom 11/18/2019   Palpitations 02/19/2013   Onset of symptoms March '14. EKG with NSR    Parkinson's disease    concerns given positive DaTScan   Past Surgical History:  Procedure Laterality Date   benign moles removed     CATARACT EXTRACTION W/ INTRAOCULAR LENS IMPLANT Bilateral    right - Nov '11, left - Nov '13 [place]     sebaceous cyst excised     Family History  Problem Relation Age of Onset   Dementia Mother    Depression Mother 35   Heart disease Father    Cancer Sister  breast   Heart disease Other    Heart disease Other    Heart disease Other    Cancer Sister        breast   Hypothyroidism Sister    Asthma Sister    Rheum arthritis Paternal Grandmother    Rheum arthritis Daughter    Social History   Socioeconomic History   Marital status: Widowed    Spouse name: Not on file   Number of children: 4   Years of education: 16   Highest education level: Bachelor's degree (e.g., BA, AB, BS)  Occupational History   Occupation: Retired    Comment: Freight forwarder at [employer]  Tobacco Use   Smoking status: Former    Packs/day: 0.10     Years: 1.00    Pack years: 0.10    Types: Cigarettes    Quit date: 12/12/1962    Years since quitting: 59.0   Smokeless tobacco: Never   Tobacco comments:    smoked for 3 months in college   Vaping Use   Vaping Use: Never used  Substance and Sexual Activity   Alcohol use: Not Currently    Comment: rarely   Drug use: No   Sexual activity: Not Currently    Partners: Male  Other Topics Concern   Not on file  Social History Narrative   College grad. Married '61. 4 daughters, 2 grandchildren. 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   Advanced Directives: Has Living Will, HCPOA: Jerelene Salaam [phone]         [place]       01/22/19: Pt. Lives in [place] home, daughters check in on patient, managing medications   Pt. Still drives, manages yardwork she says   Enjoys swim aerobics at the [facility]   Right handed   Social Determinants of Health   Financial Resource Strain: Low Risk    Difficulty of Paying Living Expenses: Not hard at all  Food Insecurity: No Food Insecurity   Worried About Charity fundraiser in the Last Year: Never true   Arboriculturist in the Last Year: Never true  Transportation Needs: No Transportation Needs   Lack of Transportation (Medical): No   Lack of Transportation (Non-Medical): No  Physical Activity: Insufficiently Active   Days of Exercise per Week: 2 days   Minutes of Exercise per Session: 20 min  Stress: Stress Concern Present   Feeling of Stress : Very much  Social Connections: Socially Isolated   Frequency of Communication with Friends and Family: More than three times a week   Frequency of Social Gatherings with Friends and Family: More than three times a week   Attends Religious Services: Never   Marine scientist or Organizations: No   Attends Archivist Meetings: Not on file   Marital Status: Widowed   Clinical Intake:  Diabetic? No   Activities of Daily Living In your present state of health, do you have  any difficulty performing the following activities: 12/22/2021  Hearing? N  Vision? N  Difficulty concentrating or making decisions? N  Walking or climbing stairs? N  Dressing or bathing? N  Doing errands, shopping? N  Some recent data might be hidden    Patient Care Team: [name], MD as PCP - General (Family Medicine) Cher Nakai, Lexine Baton, DO as Consulting Physician (Ophthalmology) Thayer Headings, PMHNP as Nurse Practitioner (Psychiatry) Kirtland Bouchard, PhD (Inactive) as Consulting Physician (Psychology) Cameron Sprang, MD as Consulting Physician (  Neurology) McDermott, Grant Ruts, PsyD (Inactive) (Psychiatry) Cameron Sprang, MD as Consulting Physician (Neurology)  Indicate any recent Medical Services you may have received from other than [facility] providers in the past year (date may be approximate).     Assessment:   This is a routine wellness examination for [place].  Hearing/Vision screen No results found.  Dietary issues and exercise activities discussed:     Goals Addressed   None   Depression Screen PHQ 2/9 Scores 11/09/2020 09/16/2019 09/16/2019 06/17/2019 01/22/2019 09/24/2018 01/19/2018  PHQ - 2 Score 1 0 0 0 1 1 0  PHQ- 9 Score 1 0 0 0 2 3 -    Fall Risk Fall Risk  12/19/2021 12/19/2021 10/20/2021 02/01/2021 11/09/2020  Falls in the past year? 1 1 0 0 1  Number falls in past yr: 0 0 0 0 1  Comment - - - - Tripped twice while walking has visual difficulties  Injury with Fall? 1 1 0 0 0  Risk for fall due to : - - - - History of fall(s);Impaired balance/gait;Impaired vision  Follow up - - - - Falls evaluation completed;Falls prevention discussed    FALL RISK PREVENTION PERTAINING TO THE HOME:  Any stairs in or around the home? Yes  If so, are there any without handrails? No  Home free of loose throw rugs in walkways, pet beds, electrical cords, etc? Yes  Adequate lighting in your home to reduce risk of falls? Yes   ASSISTIVE DEVICES UTILIZED TO PREVENT  FALLS:  Life alert? Yes  Use of a cane, walker or w/c? No  Grab bars in the bathroom? Yes  Shower chair or bench in shower? No  Elevated toilet seat or a handicapped toilet? No  TIMED UP AND GO:  Was the test performed? Yes .  Length of time to ambulate 10 feet: 5 sec.   Gait slow and steady without use of assistive device  Cognitive Function: MMSE - Mini Mental State Exam 06/19/2018 01/19/2018 12/21/2017 09/26/2017 01/10/2017  Not completed: - (No Data) - - (No Data)  Orientation to time 5 - 4 3 -  Orientation to Place 5 - 4 5 -  Registration 3 - 3 3 -  Attention/ Calculation 5 - 5 5 -  Recall 0 - 3 3 -  Language- name 2 objects 2 - 2 2 -  Language- repeat 1 - 1 1 -  Language- follow 3 step command 3 - 3 3 -  Language- read & follow direction 1 - 1 1 -  Write a sentence 1 - 1 1 -  Copy design 1 - 1 1 -  Total score 27 - 28 28 -   Montreal Cognitive Assessment  08/14/2019 03/14/2019  Visuospatial/ Executive (0/5) - 0  Naming (0/3) - 0  Attention: Read list of digits (0/2) 2 2  Attention: Read list of letters (0/1) 1 1  Attention: Serial 7 subtraction starting at 100 (0/3) 3 3  Language: Repeat phrase (0/2) 2 2  Language : Fluency (0/1) 1 1  Abstraction (0/2) 2 1  Delayed Recall (0/5) 0 0  Orientation (0/6) 4 3  Total - 13      Immunizations Immunization History  Administered Date(s) Administered   Fluad Quad(high Dose 65+) 08/23/2019   Influenza Split 08/12/2012   Influenza Whole 09/12/2009   Influenza, High Dose Seasonal PF 08/24/2013, 09/04/2015, 08/29/2016, 08/24/2017, 09/24/2018, 10/15/2018, 09/06/2021   Influenza-Unspecified 08/12/2012, 09/11/2014, 09/02/2015, 09/11/2016, 09/08/2017, 08/23/2019, 09/08/2020   PFIZER(Purple Top)SARS-COV-2 Vaccination  01/04/2020, 01/26/2020   Pneumococcal Conjugate-13 10/31/2014   Pneumococcal Polysaccharide-23 08/23/2019, 09/30/2019, 10/29/2019   Pneumococcal-Unspecified 08/24/2013, 10/12/2014   Td 05/14/2007   Tdap 10/31/2014    Zoster, Live 09/24/2013    Qualifies for Shingles Vaccine? Yes   Zostavax completed No   Shingrix Completed?: No.    Education has been provided regarding the importance of this vaccine. Patient has been advised to call insurance company to determine out of pocket expense if they have not yet received this vaccine. Advised may also receive vaccine at local pharmacy or Health Dept. Verbalized acceptance and understanding.  Screening Tests Health Maintenance  Topic Date Due   Zoster Vaccines- Shingrix (1 of 2) Never done   COVID-19 Vaccine (3 - Booster for Pfizer series) 03/22/2020   MAMMOGRAM  11/30/2022   TETANUS/TDAP  10/31/2024   Pneumonia Vaccine 64+ Years old  Completed   INFLUENZA VACCINE  Completed   DEXA SCAN  Completed   HPV VACCINES  Aged Out    Health Maintenance  Health Maintenance Due  Topic Date Due   Zoster Vaccines- Shingrix (1 of 2) Never done   COVID-19 Vaccine (3 - Booster for Pfizer series) 03/22/2020    Additional Screening:   Vision Screening: Recommended annual ophthalmology exams for early detection of glaucoma and other disorders of the eye. Is the patient up to date with their annual eye exam?  Yes  Who is the provider or what is the name of the office in which the patient attends annual eye exams? Followed by [place] Screening: Recommended annual dental exams for proper oral hygiene  Community Resource Referral / Chronic Care Management:  CRR required this visit?  No   CCM required this visit?  No      Plan:     I have personally reviewed and noted the following in the patients chart:   Medical and social history Use of alcohol, tobacco or illicit drugs  Current medications and supplements including opioid prescriptions. Patient currently not taking opioids Functional ability and status Nutritional status Physical activity Advanced directives List of other physicians Hospitalizations, surgeries, and ER visits in  previous 12 months Vitals Screenings to include cognitive, depression, and falls Referrals and appointments  In addition, I have reviewed and discussed with patient certain preventive protocols, quality metrics, and best practice recommendations. A written personalized care plan for preventive services as well as general preventive health recommendations were provided to patient.     Criselda Peaches, LPN   [id number]

## 2021-12-22 NOTE — Patient Instructions (Addendum)
[name] , Thank you for taking time to come for your Medicare Wellness Visit. I appreciate your ongoing commitment to your health goals. Please review the following plan we discussed and let me know if I can assist you in the future.   These are the goals we discussed:  Goals      Exercise 3x per week (30 min per time)     patient     Getting up happy and feeling good .     Patient Stated     Continue to enjoy your reprieve and good feelings  Enjoying your life!     Patient Stated     Wean off medications I don't need! Return to walking routine, sticking to a daily routine       Advanced directives: Yes  Conditions/risks identified: None  Next appointment: Follow up in one year for your annual wellness visit    Preventive Care 65 Years and Older, Female Preventive care refers to lifestyle choices and visits with your health care provider that can promote health and wellness. What does preventive care include? A yearly physical exam. This is also called an annual well check. Dental exams once or twice a year. Routine eye exams. Ask your health care provider how often you should have your eyes checked. Personal lifestyle choices, including: Daily care of your teeth and gums. Regular physical activity. Eating a healthy diet. Avoiding tobacco and drug use. Limiting alcohol use. Practicing safe sex. Taking low-dose aspirin every day. Taking vitamin and mineral supplements as recommended by your health care provider. What happens during an annual well check? The services and screenings done by your health care provider during your annual well check will depend on your age, overall health, lifestyle risk factors, and family history of disease. Counseling  Your health care provider may ask you questions about your: Alcohol use. Tobacco use. Drug use. Emotional well-being. Home and relationship well-being. Sexual activity. Eating habits. History of falls. Memory and  ability to understand (cognition). Work and work Statistician. Reproductive health. Screening  You may have the following tests or measurements: Height, weight, and BMI. Blood pressure. Lipid and cholesterol levels. These may be checked every 5 years, or more frequently if you are over 24 years old. Skin check. Lung cancer screening. You may have this screening every year starting at age 32 if you have a 30-pack-year history of smoking and currently smoke or have quit within the past 15 years. Fecal occult blood test (FOBT) of the stool. You may have this test every year starting at age 5. Flexible sigmoidoscopy or colonoscopy. You may have a sigmoidoscopy every 5 years or a colonoscopy every 10 years starting at age 61. Hepatitis C blood test. Hepatitis B blood test. Sexually transmitted disease (STD) testing. Diabetes screening. This is done by checking your blood sugar (glucose) after you have not eaten for a while (fasting). You may have this done every 1-3 years. Bone density scan. This is done to screen for osteoporosis. You may have this done starting at age 60. Mammogram. This may be done every 1-2 years. Talk to your health care provider about how often you should have regular mammograms. Talk with your health care provider about your test results, treatment options, and if necessary, the need for more tests. Vaccines  Your health care provider may recommend certain vaccines, such as: Influenza vaccine. This is recommended every year. Tetanus, diphtheria, and acellular pertussis (Tdap, Td) vaccine. You may need a Td booster every  10 years. Zoster vaccine. You may need this after age 56. Pneumococcal 13-valent conjugate (PCV13) vaccine. One dose is recommended after age 44. Pneumococcal polysaccharide (PPSV23) vaccine. One dose is recommended after age 84. Talk to your health care provider about which screenings and vaccines you need and how often you need them. This information is  not intended to replace advice given to you by your health care provider. Make sure you discuss any questions you have with your health care provider. Document Released: 12/25/2015 Document Revised: 08/17/2016 Document Reviewed: 09/29/2015 Elsevier Interactive Patient Education  2017 [place] Prevention in the Home Falls can cause injuries. They can happen to people of all ages. There are many things you can do to make your home safe and to help prevent falls. What can I do on the outside of my home? Regularly fix the edges of walkways and driveways and fix any cracks. Remove anything that might make you trip as you walk through a door, such as a raised step or threshold. Trim any bushes or trees on the path to your home. Use bright outdoor lighting. Clear any walking paths of anything that might make someone trip, such as rocks or tools. Regularly check to see if handrails are loose or broken. Make sure that both sides of any steps have handrails. Any raised decks and porches should have guardrails on the edges. Have any leaves, snow, or ice cleared regularly. Use sand or salt on walking paths during winter. Clean up any spills in your garage right away. This includes oil or grease spills. What can I do in the bathroom? Use night lights. Install grab bars by the toilet and in the tub and shower. Do not use towel bars as grab bars. Use non-skid mats or decals in the tub or shower. If you need to sit down in the shower, use a plastic, non-slip stool. Keep the floor dry. Clean up any water that spills on the floor as soon as it happens. Remove soap buildup in the tub or shower regularly. Attach bath mats securely with double-sided non-slip rug tape. Do not have throw rugs and other things on the floor that can make you trip. What can I do in the bedroom? Use night lights. Make sure that you have a light by your bed that is easy to reach. Do not use any sheets or blankets that  are too big for your bed. They should not hang down onto the floor. Have a firm chair that has side arms. You can use this for support while you get dressed. Do not have throw rugs and other things on the floor that can make you trip. What can I do in the kitchen? Clean up any spills right away. Avoid walking on wet floors. Keep items that you use a lot in easy-to-reach places. If you need to reach something above you, use a strong step stool that has a grab bar. Keep electrical cords out of the way. Do not use floor polish or wax that makes floors slippery. If you must use wax, use non-skid floor wax. Do not have throw rugs and other things on the floor that can make you trip. What can I do with my stairs? Do not leave any items on the stairs. Make sure that there are handrails on both sides of the stairs and use them. Fix handrails that are broken or loose. Make sure that handrails are as long as the stairways. Check any carpeting to make  sure that it is firmly attached to the stairs. Fix any carpet that is loose or worn. Avoid having throw rugs at the top or bottom of the stairs. If you do have throw rugs, attach them to the floor with carpet tape. Make sure that you have a light switch at the top of the stairs and the bottom of the stairs. If you do not have them, ask someone to add them for you. What else can I do to help prevent falls? Wear shoes that: Do not have high heels. Have rubber bottoms. Are comfortable and fit you well. Are closed at the toe. Do not wear sandals. If you use a stepladder: Make sure that it is fully opened. Do not climb a closed stepladder. Make sure that both sides of the stepladder are locked into place. Ask someone to hold it for you, if possible. Clearly mark and make sure that you can see: Any grab bars or handrails. First and last steps. Where the edge of each step is. Use tools that help you move around (mobility aids) if they are needed. These  include: Canes. Walkers. Scooters. Crutches. Turn on the lights when you go into a dark area. Replace any light bulbs as soon as they burn out. Set up your furniture so you have a clear path. Avoid moving your furniture around. If any of your floors are uneven, fix them. If there are any pets around you, be aware of where they are. Review your medicines with your doctor. Some medicines can make you feel dizzy. This can increase your chance of falling. Ask your doctor what other things that you can do to help prevent falls. This information is not intended to replace advice given to you by your health care provider. Make sure you discuss any questions you have with your health care provider. Document Released: 09/24/2009 Document Revised: 05/05/2016 Document Reviewed: 01/02/2015 Elsevier Interactive Patient Education  2017 [employer].. This is a list of the screening recommended for you and due dates:  Health Maintenance  Topic Date Due   COVID-19 Vaccine (4 - Booster for Pfizer series) 10/27/2021   Zoster (Shingles) Vaccine (1 of 2) 03/22/2022*   Mammogram  11/30/2022   Tetanus Vaccine  10/31/2024   Pneumonia Vaccine  Completed   Flu Shot  Completed   DEXA scan (bone density measurement)  Completed   HPV Vaccine  Aged Out  *Topic was postponed. The date shown is not the original due date.   [name]

## 2021-12-22 NOTE — Progress Notes (Signed)
[name] DOB: 04/05/1936 Encounter date: 12/22/2021  This is a 86 y.o. female who presents with Chief Complaint  Patient presents with   Follow-up    Pt would like to discuss her tremors. Pt's daughter would like to discuss d/c progesterone     History of present illness: Patient scheduled for annual wellness visit today, but since last visit with me was 11/09/20,I would like to review chronic conditions today.   Last visit with neurology was 10/20/2021.  Actively, her diagnosis is combined Alzheimer's and Parkinson's.  Daughter is asking about her being on hormones. Daughter states that her gyn started hormones a couple of years ago, but wondering if this might be stopped.   Patient notes late afternoon, early evening some increased fatigue. At some points in day she feels down. Has related this to pill overall.  Feels like tremors are worse overall. Tremors more limited to left hand. Not limiting her activity level.  Daughter states that she has done much better at [facility]. Has made a friend, more involved. Good positive changes.   Feels sleepy in the morning.   Appetite is generally ok.    Allergies  Allergen Reactions   Aricept [Donepezil Hcl]     Hallucinations, confusion   Cephalexin     REACTION: diaphoretic and drop in Blood pressure   Lorazepam Other (See Comments)    amnesia   Melatonin    Sinemet [Carbidopa-Levodopa]     confusion   Current Meds  Medication Sig   CALCIUM CITRATE-VITAMIN D PO Take by mouth.   carbidopa-levodopa (SINEMET IR) 25-100 MG tablet Take 1 tablets three times a day with meals   dorzolamide-timolol (COSOPT) 22.3-6.8 MG/ML ophthalmic solution dorzolamide 22.3 mg-timolol 6.8 mg/mL eye drops   fluticasone (FLONASE) 50 MCG/ACT nasal spray Place 1 spray into both nostrils daily. Up to 3 times daily as needed   Multiple Vitamin (MULTIVITAMIN) capsule Take 1 capsule by mouth daily.   psyllium (METAMUCIL SMOOTH TEXTURE) 58.6 % powder  Take 1 packet by mouth daily.   rivastigmine (EXELON) 3 MG capsule Take 1 capsule (3 mg total) by mouth 2 (two) times daily.   sertraline (ZOLOFT) 50 MG tablet Take 1 tablet (50 mg total) by mouth daily.   [DISCONTINUED] estradiol (ESTRACE) 0.5 MG tablet    [DISCONTINUED] progesterone (PROMETRIUM) 100 MG capsule Take 100 mg by mouth at bedtime.   [DISCONTINUED] progesterone (PROMETRIUM) 100 MG capsule Take 100 mg by mouth at bedtime.    Review of Systems  Constitutional:  Negative for chills, fatigue and fever.  Respiratory:  Negative for cough, chest tightness, shortness of breath and wheezing.   Cardiovascular:  Negative for chest pain, palpitations and leg swelling.   Objective:  BP 118/70    Pulse (!) 51    Temp (!) 97.4 F (36.3 C) (Oral)    Resp 18    Ht 5\' 3"  (1.6 m)    Wt 117 lb 14.4 oz (53.5 kg)    SpO2 97%    BMI 20.89 kg/m   Weight: 117 lb 14.4 oz (53.5 kg)   BP Readings from Last 3 Encounters:  12/22/21 118/70  12/22/21 118/70  10/20/21 135/68   Wt Readings from Last 3 Encounters:  12/22/21 117 lb (53.1 kg)  12/22/21 117 lb 14.4 oz (53.5 kg)  10/20/21 114 lb (51.7 kg)    Physical Exam Constitutional:      General: She is not in acute distress.    Appearance: She is well-developed.  HENT:  Head: Normocephalic and atraumatic.     Right Ear: External ear normal.     Left Ear: External ear normal.     Mouth/Throat:     Pharynx: No oropharyngeal exudate.  Eyes:     Conjunctiva/sclera: Conjunctivae normal.     Pupils: Pupils are equal, round, and reactive to light.  Neck:     Thyroid: No thyromegaly.  Cardiovascular:     Rate and Rhythm: Normal rate and regular rhythm.     Heart sounds: Normal heart sounds. No murmur heard.   No friction rub. No gallop.  Pulmonary:     Effort: Pulmonary effort is normal.     Breath sounds: Normal breath sounds.  Abdominal:     General: Bowel sounds are normal. There is no distension.     Palpations: Abdomen is soft.  There is no mass.     Tenderness: There is no abdominal tenderness. There is no guarding.     Hernia: No hernia is present.  Musculoskeletal:        General: No tenderness or deformity. Normal range of motion.     Cervical back: Normal range of motion and neck supple.  Lymphadenopathy:     Cervical: No cervical adenopathy.  Skin:    General: Skin is warm and dry.     Findings: No rash.  Neurological:     Mental Status: She is alert and oriented to person, place, and time.     Deep Tendon Reflexes: Reflexes normal.     Reflex Scores:      Tricep reflexes are 2+ on the right side and 2+ on the left side.      Bicep reflexes are 2+ on the right side and 2+ on the left side.      Brachioradialis reflexes are 2+ on the right side and 2+ on the left side.      Patellar reflexes are 2+ on the right side and 2+ on the left side.    Comments: Resting tremor left hand. Some masked facies.  Psychiatric:        Speech: Speech normal.        Behavior: Behavior normal.        Thought Content: Thought content normal.    Assessment/Plan  1. Major neurocognitive disorder Following with neurology regularly. They are managing medications and working on symptom control.   2. Parkinson disease ([place]) See above. She has done well with adjustment in living facility. We discussed setting alarm for morning exercise group, which is important to her.   3. Hyperlipidemia, unspecified hyperlipidemia type - Lipid panel; Future - Lipid panel  4. Generalized anxiety disorder She feels better with exercise. Continue zoloft 50mg , work on regular morning exercise group and keep up with social groups through living facility. She feels better having "purpose" and staying active in her living facility gives her this satisfaction.   5. Mild episode of recurrent major depressive disorder ([place]) See above.continue with zoloft. Mood is doing better currently.   6. Primary hypertension Bp well controlledwithout  medication.  - CBC with Differential/Platelet; Future - Comprehensive metabolic panel; Future - Comprehensive metabolic panel - CBC with Differential/Platelet  7. Other fatigue Bloodwork today; she notes after taking medication, but doesn't want to add naps in to day. Can discuss with neurology to see if timing of meds can be different, but she also states that energy level is better with exercise, so she will try to get in the morning exercise class.  - TSH;  Future - TSH  8. B12 deficiency - Vitamin B12; Future - Vitamin B12  9. Vitamin D deficiency - VITAMIN D 25 Hydroxy (Vit-D Deficiency, Fractures); Future - VITAMIN D 25 Hydroxy (Vit-D Deficiency, Fractures)  10. Need for pneumococcal vaccination - Pneumococcal conjugate vaccine 20-valent (Prevnar 20)   Return in about 6 months (around 06/21/2022) for Chronic condition visit.   45 minutes spent in chart review,discussion of chronic conditions, exam, follow up plan, charting.   Micheline Rough, MD

## 2021-12-23 LAB — CBC WITH DIFFERENTIAL/PLATELET
Basophils Absolute: 0.1 10*3/uL (ref 0.0–0.1)
Basophils Relative: 1 % (ref 0.0–3.0)
Eosinophils Absolute: 0.1 10*3/uL (ref 0.0–0.7)
Eosinophils Relative: 1.2 % (ref 0.0–5.0)
HCT: 37.9 % (ref 36.0–46.0)
Hemoglobin: 12.7 g/dL (ref 12.0–15.0)
Lymphocytes Relative: 31.8 % (ref 12.0–46.0)
Lymphs Abs: 2 10*3/uL (ref 0.7–4.0)
MCHC: 33.4 g/dL (ref 30.0–36.0)
MCV: 93.2 fl (ref 78.0–100.0)
Monocytes Absolute: 0.6 10*3/uL (ref 0.1–1.0)
Monocytes Relative: 9.5 % (ref 3.0–12.0)
Neutro Abs: 3.5 10*3/uL (ref 1.4–7.7)
Neutrophils Relative %: 56.5 % (ref 43.0–77.0)
Platelets: 214 10*3/uL (ref 150.0–400.0)
RBC: 4.07 Mil/uL (ref 3.87–5.11)
RDW: 13 % (ref 11.5–15.5)
WBC: 6.2 10*3/uL (ref 4.0–10.5)

## 2021-12-23 LAB — COMPREHENSIVE METABOLIC PANEL
ALT: 13 U/L (ref 0–35)
AST: 17 U/L (ref 0–37)
Albumin: 4.1 g/dL (ref 3.5–5.2)
Alkaline Phosphatase: 49 U/L (ref 39–117)
BUN: 15 mg/dL (ref 6–23)
CO2: 28 mEq/L (ref 19–32)
Calcium: 9 mg/dL (ref 8.4–10.5)
Chloride: 106 mEq/L (ref 96–112)
Creatinine, Ser: 0.82 mg/dL (ref 0.40–1.20)
GFR: 65.91 mL/min (ref >60.00)
Glucose, Bld: 81 mg/dL (ref 70–99)
Potassium: 3.9 mEq/L (ref 3.5–5.1)
Sodium: 141 mEq/L (ref 135–145)
Total Bilirubin: 0.9 mg/dL (ref 0.2–1.2)
Total Protein: 6.4 g/dL (ref 6.0–8.3)

## 2021-12-23 LAB — LIPID PANEL
Cholesterol: 227 mg/dL — ABNORMAL HIGH (ref 0–200)
HDL: 65.6 mg/dL (ref >39.00)
LDL Cholesterol: 138 mg/dL — ABNORMAL HIGH (ref 0–99)
NonHDL: 161.06
Total CHOL/HDL Ratio: 3
Triglycerides: 113 mg/dL (ref 0.0–149.0)
VLDL: 22.6 mg/dL (ref 0.0–40.0)

## 2021-12-23 LAB — VITAMIN B12: Vitamin B-12: 445 pg/mL (ref 211–911)

## 2021-12-23 LAB — TSH: TSH: 2.11 u[IU]/mL (ref 0.35–5.50)

## 2021-12-23 LAB — VITAMIN D 25 HYDROXY (VIT D DEFICIENCY, FRACTURES): VITD: 36.14 ng/mL (ref 30.00–100.00)

## 2021-12-27 ENCOUNTER — Telehealth: Payer: Self-pay | Admitting: Family Medicine

## 2021-12-27 NOTE — Telephone Encounter (Signed)
Paperwork to be filled out--brought in yellow envelope.  Call daughter Doy Mince upon completion [phone]

## 2021-12-28 NOTE — Telephone Encounter (Signed)
Are they able to come perhaps at lunch time to pick those up - both patient/daughter - so that I can confirm patient's wishes before I sign them? I can hold on to forms. I don't feel they need an office visit since we already reviewed anything. I just want the eye to eye contact/verbal ok to sign the sheets. We could do like a 12:15 or 12:45 tomorrow, Friday?

## 2021-12-28 NOTE — Telephone Encounter (Signed)
Spoke with [name] and informed her of the message below.  She stated she will check with her sister to see if she can bring the patient in tomorrow and will call back.

## 2021-12-29 NOTE — Telephone Encounter (Signed)
[name] is available on 01-07-2022 or 01-14-2022. Dr Ethlyn Gallery has same day slot is it ok to use one of them

## 2021-12-31 NOTE — Telephone Encounter (Signed)
Patient's daughter called back, patient has been scheduled for Jan. 27th.

## 2021-12-31 NOTE — Telephone Encounter (Signed)
LVM instructions for [name] to call office to schedule an appt for pt. Dr Ethlyn Gallery approved using the same day slots on the days she requested.

## 2021-12-31 NOTE — Telephone Encounter (Signed)
Noted  

## 2021-12-31 NOTE — Telephone Encounter (Signed)
Yep that's fine

## 2022-01-03 DIAGNOSIS — Z20822 Contact with and (suspected) exposure to covid-19: Secondary | ICD-10-CM | POA: Diagnosis not present

## 2022-01-07 ENCOUNTER — Encounter: Payer: Self-pay | Admitting: Family Medicine

## 2022-01-07 ENCOUNTER — Ambulatory Visit (INDEPENDENT_AMBULATORY_CARE_PROVIDER_SITE_OTHER): Payer: Medicare Other | Admitting: Family Medicine

## 2022-01-07 VITALS — BP 108/62 | HR 77 | Temp 97.5°F | Ht 63.0 in | Wt 118.9 lb

## 2022-01-07 DIAGNOSIS — G20 Parkinson's disease: Secondary | ICD-10-CM

## 2022-01-07 NOTE — Progress Notes (Signed)
We discussed code status in detail at last visit, and daughter/patient discussed wishes and returned forms for signature. Patient asked to come in to verify her wishes so that forms could be signed and put on file for her.  We reviewed MOST form today as well as DNR.  Patient does not wish to have any cardiopulmonary resuscitation in the event that heart stops or she is found unresponsive.  She does wish to have comfort measures such as IV fluids if they will be beneficial in the short-term, oxygen, antibiotics.  Forms are completed accordingly and original copies given to daughter/patient. All questions were answered.

## 2022-01-10 DIAGNOSIS — Z20828 Contact with and (suspected) exposure to other viral communicable diseases: Secondary | ICD-10-CM | POA: Diagnosis not present

## 2022-01-11 ENCOUNTER — Ambulatory Visit (INDEPENDENT_AMBULATORY_CARE_PROVIDER_SITE_OTHER): Payer: Medicare Other | Admitting: Psychiatry

## 2022-01-11 ENCOUNTER — Other Ambulatory Visit: Payer: Self-pay

## 2022-01-11 ENCOUNTER — Other Ambulatory Visit: Payer: Self-pay | Admitting: Neurology

## 2022-01-11 ENCOUNTER — Encounter: Payer: Self-pay | Admitting: Psychiatry

## 2022-01-11 DIAGNOSIS — F411 Generalized anxiety disorder: Secondary | ICD-10-CM

## 2022-01-11 DIAGNOSIS — F330 Major depressive disorder, recurrent, mild: Secondary | ICD-10-CM | POA: Diagnosis not present

## 2022-01-11 MED ORDER — BUPROPION HCL ER (SR) 100 MG PO TB12: 100.0000 mg | ORAL_TABLET | Freq: Every morning | ORAL | 1 refills | Status: DC

## 2022-01-11 MED ORDER — SERTRALINE HCL 50 MG PO TABS: 50.0000 mg | ORAL_TABLET | Freq: Every day | ORAL | 2 refills | Status: DC

## 2022-01-11 NOTE — Progress Notes (Signed)
INDIE BOEHNE [id number] March 05, 1936 86 y.o.  Subjective:   Patient ID:  [name] is a 86 y.o. (DOB Apr 20, 1936) female.  Chief Complaint:  Chief Complaint  Patient presents with   Other    Occ illusions   Anxiety   Depression    Anxiety    Depression        Past medical history includes anxiety.   Julizza Sassone Fiorini presents to the office today for follow-up of anxiety and depression.  She is accompanied by her daughter, [name]. She reports that she has difficulty getting up in the morning and has been staying in bed until 10:30 and is missing an exercise class at 10 am. She reports that she initially wakes up around 5 am and reports that she gets dressed, has breakfast, takes morning meds, and then falls back to sleep. She questions if her medication may cause sleepiness in the morning.   They report that she has been experiencing hallucinations/illusions and neurology PA mentioned Depakote. Daughter reports that hallucinations occur in the afternoon. Daughter reports on one occasion pt thought 2 men were in her apartment. Daughter reports that she was perceiving inanimate objects (ie. Christmas decorations) as people. Daughter reports that these hallucinations and illusions occur periodically. Denies paranoia. Denies auditory hallucinations. Daughter reports that illusions occur periodically, ie. None for about a month and some 5 days in a row.  She reports occasional sadness. She reports experiencing some anxiety and will "feel it in my chest." She reports some occasional worry. She reports some panic s/s daily and she reports "I work through it" and it may last about an hour. Worries about cat getting loose. Denies difficulty falling asleep at night. Goes to bed as early as 8 pm and reports she has been going to bed earlier. She reports that her energy is low in the morning. She reports that her appetite is not good. She reports poor concentration. She reports that she has been unable to  read or watch a TV show. She reports passive death wishes. She reports vague suicidal thoughts and has thought about taking pills and denies access. Contracts for safety. She reports that she will "walk out of the room" when she is having negative thoughts, contact daughters, or talk with someone at [facility].   She continues to live at [facility]. They report that she has been getting more involved and making some friends. Daughter reports that pt has been on waitlist for memory care unit for over a year.   Pt's sister took Wellbutrin  Sertaline Paxil Lexapro- Started in 2017 and took for about a year Prozac- Took after Lexapro and took for about a year Aricept- Adverse reaction Exelon Ativan- Adverse reaction  Mini-Mental    Flowsheet Row Office Visit from 06/19/2018 in [facility] from 12/18/2017 in [facility] from 09/26/2017 in [facility]  Total Score (max 30 points ) 27 28 28       PHQ2-9    Flowsheet Row Clinical Support from 12/22/2021 in [facility] from 11/09/2020 in [place] [facility] [place] at [facility] from 09/16/2019 in [place] at [facility] from 06/17/2019 in [facility] from 01/22/2019 in [facility] at [facility] Total Score 1 1 0 0 1  PHQ-9 Total Score -- 1 0 0 2      [facility] from 06/09/2021 in [facility]  [place]  CATEGORY Error: Question 6 not populated        Review of Systems:  Review of Systems  Eyes:        Diminished eye sight  Musculoskeletal:  Negative for gait problem.  Neurological:  Positive for tremors.  Psychiatric/Behavioral:  Positive for depression.        Please refer to HPI   Medications: I have reviewed the patient's current medications.  Current Outpatient Medications  Medication Sig Dispense Refill    buPROPion ER (WELLBUTRIN SR) 100 MG 12 hr tablet Take 1 tablet (100 mg total) by mouth every morning. 30 tablet 1   CALCIUM CITRATE-VITAMIN D PO Take by mouth.     carbidopa-levodopa (SINEMET IR) 25-100 MG tablet Take 1 tablets three times a day with meals 135 tablet 11   dorzolamide-timolol (COSOPT) 22.3-6.8 MG/ML ophthalmic solution dorzolamide 22.3 mg-timolol 6.8 mg/mL eye drops     fluticasone (FLONASE) 50 MCG/ACT nasal spray Place 1 spray into both nostrils daily. Up to 3 times daily as needed     Multiple Vitamin (MULTIVITAMIN) capsule Take 1 capsule by mouth daily.     psyllium (METAMUCIL SMOOTH TEXTURE) 58.6 % powder Take 1 packet by mouth daily. 283 g 12   rivastigmine (EXELON) 3 MG capsule Take 1 capsule (3 mg total) by mouth 2 (two) times daily. 60 capsule 11   sertraline (ZOLOFT) 50 MG tablet Take 1 tablet (50 mg total) by mouth daily. 90 tablet 2   No current facility-administered medications for this visit.    Medication Side Effects: Other: Possible fatigue/drowsiness  Allergies:  Allergies  Allergen Reactions   Aricept [Donepezil Hcl]     Hallucinations, confusion   Cephalexin     REACTION: diaphoretic and drop in Blood pressure   Lorazepam Other (See Comments)    amnesia   Melatonin    Sinemet [Carbidopa-Levodopa]     confusion    Past Medical History:  Diagnosis Date   Allergic rhinitis 02/07/2008   Contact dermatitis 03/16/2013   Generalized anxiety disorder 02/19/2013   has seen [name], [name], and now [name] for counseling   Hemorrhoids, external    Hot flashes    Hyperlipemia 02/07/2008   Long standing problem. Pretreatment LDL 175 in 2006, 210 in 2010.  Had tried lipitor but question of rash after 3 days of use. Switched to zocor but reports having leg pain. Retried Lipitor - weakness.  Currently on no medications    Hypertension    Irritable bowel syndrome    Major depressive disorder 03/18/2016   Major neurocognitive disorder  01/27/2019   possible Alzheimer's disease or mixed presentation   Menopausal symptom 11/18/2019   Palpitations 02/19/2013   Onset of symptoms March '14. EKG with NSR    Parkinson's disease    concerns given positive DaTScan    Past Medical History, Surgical history, Social history, and Family history were reviewed and updated as appropriate.   Please see review of systems for further details on the patient's review from today.   Objective:   Physical Exam:  There were no vitals taken for this visit.  Physical Exam Constitutional:      General: She is not in acute distress. Musculoskeletal:        General: No deformity.  Neurological:     Mental Status: She is alert and oriented to person, place, and time.     Coordination: Coordination normal.  Psychiatric:        Attention and Perception: Attention and  perception normal. She does not perceive auditory or visual hallucinations.        Mood and Affect: Mood is anxious and depressed. Affect is not labile, blunt, angry or inappropriate.        Speech: Speech normal.        Behavior: Behavior normal.        Thought Content: Thought content normal. Thought content is not paranoid or delusional. Thought content does not include homicidal or suicidal ideation. Thought content does not include homicidal or suicidal plan.        Cognition and Memory: Cognition normal. Memory is impaired.        Judgment: Judgment normal.     Comments: Insight fair to poor    Lab Review:     Component Value Date/Time   NA 141 12/22/2021 1615   K 3.9 12/22/2021 1615   CL 106 12/22/2021 1615   CO2 28 12/22/2021 1615   GLUCOSE 81 12/22/2021 1615   BUN 15 12/22/2021 1615   CREATININE 0.82 12/22/2021 1615   CALCIUM 9.0 12/22/2021 1615   PROT 6.4 12/22/2021 1615   ALBUMIN 4.1 12/22/2021 1615   AST 17 12/22/2021 1615   ALT 13 12/22/2021 1615   ALKPHOS 49 12/22/2021 1615   BILITOT 0.9 12/22/2021 1615   GFRNONAA >60 06/09/2021 1132   GFRAA >60  07/25/2020 1139       Component Value Date/Time   WBC 6.2 12/22/2021 1615   RBC 4.07 12/22/2021 1615   HGB 12.7 12/22/2021 1615   HCT 37.9 12/22/2021 1615   PLT 214.0 12/22/2021 1615   MCV 93.2 12/22/2021 1615   MCH 31.6 06/09/2021 1132   MCHC 33.4 12/22/2021 1615   RDW 13.0 12/22/2021 1615   LYMPHSABS 2.0 12/22/2021 1615   MONOABS 0.6 12/22/2021 1615   EOSABS 0.1 12/22/2021 1615   BASOSABS 0.1 12/22/2021 1615    No results found for: POCLITH, LITHIUM   No results found for: PHENYTOIN, PHENOBARB, VALPROATE, CBMZ   .res Assessment: Plan:   Pt seen for 45 minutes and time spent discussing safety plan, medication options for depression, and treatment options for psychosis.  Daughter reports that she will remove any OTC meds and any other potentially harmful objects from pt's apartment. Daughter encouraged pt to contact her if she experiences any thoughts of suicide. Pt contracts for safety and agrees to call her daughter, seek out someone at her facility, or leave her room and engage in another activity if she experiences suicidal thoughts.  Discussed potential benefits, risks, and side effects of Wellbutrin for depression. Discussed that it can be taken in combination with Sertraline and that it has a different mechanism of action and targets dopamine. Discussed that Wellbutrin may be better tolerated for her since she has had adverse reactions to several SSRI's and also has a first degree relative that has responded well to Wellbutrin. Discussed that Wellbutrin could be stopped if adverse effects occur.  Continue Sertraline 50 mg po qd for anxiety and depression.  Pt to follow-up in 6 weeks or sooner if clinically indicated.  Patient  and daughter advised to contact office with any questions, adverse effects, or acute worsening in signs and symptoms.   Melane was seen today for other, anxiety and depression.  Diagnoses and all orders for this visit:  Generalized anxiety  disorder -     sertraline (ZOLOFT) 50 MG tablet; Take 1 tablet (50 mg total) by mouth daily.  Mild episode of recurrent major depressive disorder ([place]) -  sertraline (ZOLOFT) 50 MG tablet; Take 1 tablet (50 mg total) by mouth daily. -     buPROPion ER (WELLBUTRIN SR) 100 MG 12 hr tablet; Take 1 tablet (100 mg total) by mouth every morning.     Please see After Visit Summary for patient specific instructions.  Future Appointments  Date Time Provider [place]  02/11/2022 11:30 AM Cameron Sprang, MD [facility] None  02/16/2022 12:45 PM Thayer Headings, PMHNP [facility] None  12/27/2022  3:15 PM [facility]-NURSE HEALTH ADVISOR [facility]    No orders of the defined types were placed in this encounter.   -------------------------------

## 2022-02-11 ENCOUNTER — Encounter: Payer: Self-pay | Admitting: Neurology

## 2022-02-11 ENCOUNTER — Ambulatory Visit (INDEPENDENT_AMBULATORY_CARE_PROVIDER_SITE_OTHER): Payer: Medicare Other | Admitting: Neurology

## 2022-02-11 ENCOUNTER — Other Ambulatory Visit: Payer: Self-pay

## 2022-02-11 VITALS — BP 128/69 | HR 59 | Ht 63.0 in | Wt 117.6 lb

## 2022-02-11 DIAGNOSIS — G301 Alzheimer's disease with late onset: Secondary | ICD-10-CM

## 2022-02-11 DIAGNOSIS — F02818 Dementia in other diseases classified elsewhere, unspecified severity, with other behavioral disturbance: Secondary | ICD-10-CM | POA: Diagnosis not present

## 2022-02-11 DIAGNOSIS — G20 Parkinson's disease: Secondary | ICD-10-CM

## 2022-02-11 MED ORDER — CARBIDOPA-LEVODOPA 25-100 MG PO TABS
ORAL_TABLET | ORAL | 11 refills | Status: DC
Start: 2022-02-11 — End: 2022-03-07

## 2022-02-11 MED ORDER — RIVASTIGMINE TARTRATE 3 MG PO CAPS: 3.0000 mg | ORAL_CAPSULE | Freq: Two times a day (BID) | ORAL | 11 refills | Status: DC

## 2022-02-11 NOTE — Progress Notes (Unsigned)
NEUROLOGY FOLLOW UP OFFICE NOTE  [name] [id number] 20-Dec-1935  HISTORY OF PRESENT ILLNESS: I had the pleasure of seeing [name] in follow-up in the neurology clinic on 02/11/2022.  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 She is again accompanied by her daughter [name] who helps supplement the history today.  Records and images were personally reviewed where available.  ***.  No side effects on Wellbutrin, 2/2, making a diff, family has noticed morningas are less difficult and les hard to get up and get moving, gone to exercise class; disposition is lighter Feeling heavy and goin gback to sleep when sitting Wonders if sinemet is contributing to drowsines but helps with tremor noticeable If she stops moving, she falls asleep, but if active, she is fine until exercise class Left hand resting  Daughter reports that these hallucinations and illusions occur periodically; wellburtin added to sertraline Left hand constant, urgent shaking on left hand not so bad Rare bounce on right  [name]: askign about prevagen - no Fuzzy thinking, confusion, disorientation, anxiety, delusions, hallucinations, still issues of concerns, some daysbetter than others; some related to not consistnetly remembering to eat and drink enough; family keeps fridge stocked; helps with outlook and keeping head clear MV, calcium D are very large pills with trouble swallowing, will switch to chewies Jan: Dr. Ethlyn Kidd recommended stop the hormone meds, no diff off it [name] taking to [facility] next week,    HISTORY OF PRESENT ILLNESS: I had the pleasure of seeing [name] in follow-up in the neurology clinic on 10/20/21.  This is a pleasant 86 year old RH woman with a history of with a history of hypertension, anxiety, depression, dementia.  She was last seen at our office on 02/01/2021 for dementia with behavioral disturbance and  tremors.  She is again accompanied by her daughter [name] who helps supplement the history today. Last Neuropsychological evaluation 02/16/21 Dr. Melvyn Kidd was suspicious for "mixed dementia" presentation with facets of Alzheimer's disease and Parkinson's disease .  She has had worsening left tremor, DATscan in 07/2020 showed reduced uptake in the right striatum, in a pattern seen in Parkinsonian syndromes, consideration for Lewy body dementia. She initially felt there was some improvement in left hand tremor with initiation of Sinemet, dose increased to 1 tab TID with meals, now with some control of these tremors.  She takes the medication at 7 AM, 12 PM, and at 7 PM with meals.  She lives in [place], enjoying the facility, and trying to build new acquaintances.  She is "embarrassed that she has memory problems ", to which his daughter keeps assuring her that there is nothing to be embarrassed about. The patient feels that her memory is about the same.  She continues to have some delusions and hallucinations, no followed by psychiatry.  Her next appointment is in December of this year.  Recently, she has been seeing people in the apartment, "I do not know them, but they are not scary ".  These episodes have been happening about 5 times.  2 weeks ago, she has been making meals for them, and was worried because they are not eating her food.  No other hallucinations, or paranoia have been reported.  She sleeps well, occasionally she has vivid dreams, denies sleepwalking or REM behavior.  Her depression is well controlled with sertraline.  She states that occasionally, she wets the pillow with saliva.  She still lives with her cat, and is very attached to it, afraid that her other daughter will take him away.  Her appetite is good, however, she forgets to eat at times.  Denies trouble swallowing.  She ambulates with some hesitancy, and when she gets up too quickly, she becomes dizzy.  She is resistant to using a walker.   She is doing physical therapy, tolerating well.  She likes to walk around the facility.  Of note, on 06/09/2021, the patient had a near syncopal episode hitting the left forehead with the side of the chair, falling to the ground, without loss of consciousness.  This was weakness.  Work-up at the ER was negative, and no UTI was found.  She was possibly dehydrated.  She denies any worsening vision changes, nausea, vomiting, vertigo diarrhea or constipation.  Denies urine incontinence.   History on Initial Assessment 12/18/2017: This is a pleasant 86 yo RH woman with a history of hypertension, anxiety, depression, who presented for evaluation of memory loss. She feels her memory is "a little shaky." She endorses a lot of anxiety, and states it is causing her not to recall things and she does not understand it. Family started noticing memory changes around 2 years ago. She has been the main caregiver for her husband with dementia, who had been in and out of the hospital several times. She was "not dealing with things as well as I should." For instance, 2 years ago she was taking care of the managing their family home, but they noticed she was not keeping up with it, and turned it over to her sister. Her daughter took over finances in the Spring 2018 because she was afraid she would forget and she had never done the taxes in the past. She lives alone and denies missing medications. She denies getting lost driving. She has 3 cats at home that are cared for, she does not forget to feed them. She has no difficulties running the household, doing laundry and yardwork. She has learned how to use the leafblower. Their main concern is there anxiety and depression have "really ramped up." She is so stressed and scattered, that she could not stay on task. Family started noticing anxiety around 4-5 years ago, but in January 2017 she was "in breakdown territory" and started Lexapro and counseling. It appears she was on a very low  dose due to concern for side effects. She would wake up every morning between 3-4 AM with a panic attack, unable to reason with herself, shaky. Family reports that she would forget what time they told her she would be picked up, even if it was in a text message in front of her. They have noticed lack of focus and concentration. She would ask the same question about this doctor's appointment and was quite anxious about today's visit. Family reports she "tends to catastrophize things." No paranoia or hallucinations. She was switched from Lexapro to Prozac at the family's request, and "something about Prozac bothers me all in my head."    She denies any headaches, vertigo, diplopia, dysarthria/dysphagia, neck/back pain, focal numbness/tingling/weakness, bowel/bladder dysfunction. She reports a "mental dizziness," where she states "my brain just feels full." She has tremors, R>L. She had lost her sense of smell after sinus surgery many years ago. Her mother had dementia in her 44s, her older sister has memory issues. No history of significant head injuries, no alcohol use.   Diagnostic Data: Neuropsychological evaluation on 02/09/2021, Dr. Melvyn Kidd: "Briefly, results  suggested continued and severe impairment surrounding retrieval and consolidation aspects of memory. Processing speed was also impaired, while performance variability was exhibited across executive functioning and semantic fluency. Regarding etiology, I continue to have strong concerns surrounding Alzheimer's disease. Despite being able to learning information reasonably well, she was amnestic after a brief delay with evidence for a memory storage deficit and rapid forgetting. Given the results of this scan, the potential for a "mixed dementia" presentation with facets of Alzheimer's disease and Parkinson's disease remains possible."  MRI brain with and without contrast 07/2020: no acute changes, mild cerebral atrophy and chronic microvascular  disease  DaTscan 07/2020 showed asymmetric decreased radiotracer activity within the RIGHT stratum compared to the LEFT. There is near absent activity in the posterior RIGHT striatum (putamen) and reduced activity in the head of the RIGHT caudate nucleus. Reduced radiotracer activity in the posterior LEFT striatum  PAST MEDICAL HISTORY: Past Medical History:  Diagnosis Date   Allergic rhinitis 02/07/2008   Contact dermatitis 03/16/2013   Generalized anxiety disorder 02/19/2013   has seen [name], [name], and now [name] for counseling   Hemorrhoids, external    Hot flashes    Hyperlipemia 02/07/2008   Long standing problem. Pretreatment LDL 175 in 2006, 210 in 2010.  Had tried lipitor but question of rash after 3 days of use. Switched to zocor but reports having leg pain. Retried Lipitor - weakness.  Currently on no medications    Hypertension    Irritable bowel syndrome    Major depressive disorder 03/18/2016   Major neurocognitive disorder 01/27/2019   possible Alzheimer's disease or mixed presentation   Menopausal symptom 11/18/2019   Palpitations 02/19/2013   Onset of symptoms March '14. EKG with NSR    Parkinson's disease    concerns given positive DaTScan    MEDICATIONS: Current Outpatient Medications on File Prior to Visit  Medication Sig Dispense Refill   buPROPion ER (WELLBUTRIN SR) 100 MG 12 hr tablet Take 1 tablet (100 mg total) by mouth every morning. 30 tablet 1   CALCIUM CITRATE-VITAMIN D PO Take by mouth.     carbidopa-levodopa (SINEMET IR) 25-100 MG tablet TAKE 1 AND 1/2 TABLETS (37.5-150MG ) BY MOUTH THREE TIMES A DAY WITH MEALS 135 tablet 0   dorzolamide-timolol (COSOPT) 22.3-6.8 MG/ML ophthalmic solution dorzolamide 22.3 mg-timolol 6.8 mg/mL eye drops     fluticasone (FLONASE) 50 MCG/ACT nasal spray Place 1 spray into both nostrils daily. Up to 3 times daily as needed     Multiple Vitamin (MULTIVITAMIN) capsule Take 1 capsule by mouth daily.      psyllium (METAMUCIL SMOOTH TEXTURE) 58.6 % powder Take 1 packet by mouth daily. 283 g 12   rivastigmine (EXELON) 3 MG capsule Take 1 capsule (3 mg total) by mouth 2 (two) times daily. 60 capsule 11   sertraline (ZOLOFT) 50 MG tablet Take 1 tablet (50 mg total) by mouth daily. 90 tablet 2   No current facility-administered medications on file prior to visit.    ALLERGIES: Allergies  Allergen Reactions   Aricept [Donepezil Hcl]     Hallucinations, confusion   Cephalexin     REACTION: diaphoretic and drop in Blood pressure   Lorazepam Other (See Comments)    amnesia   Melatonin    Sinemet [Carbidopa-Levodopa]     confusion    FAMILY HISTORY: Family History  Problem Relation Age of Onset   Dementia Mother    Depression Mother 82   Heart disease Father  Cancer Sister        breast   Heart disease Other    Heart disease Other    Heart disease Other    Cancer Sister        breast   Hypothyroidism Sister    Asthma Sister    Rheum arthritis Paternal Grandmother    Rheum arthritis Daughter     SOCIAL HISTORY: Social History   Socioeconomic History   Marital status: Widowed    Spouse name: Not on file   Number of children: 4   Years of education: 16   Highest education level: Bachelor's degree (e.g., BA, AB, BS)  Occupational History   Occupation: Retired    Comment: Freight forwarder at [employer]  Tobacco Use   Smoking status: Former    Packs/day: 0.10    Years: 1.00    Pack years: 0.10    Types: Cigarettes    Quit date: 12/12/1962    Years since quitting: 59.2   Smokeless tobacco: Never   Tobacco comments:    smoked for 3 months in college   Vaping Use   Vaping Use: Never used  Substance and Sexual Activity   Alcohol use: Not Currently    Comment: rarely   Drug use: No   Sexual activity: Not Currently    Partners: Male  Other Topics Concern   Not on file  Social History Narrative   College grad. Married '61. 4 daughters, 2 grandchildren. 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   Advanced Directives: Has Living Will, HCPOA: [name] [phone]         [place]       01/22/19: Pt. Lives in [place] home, daughters check in on patient, managing medications   Pt. Still drives, manages yardwork she says   Enjoys swim aerobics at the [facility]   Right handed   Social Determinants of Health   Financial Resource Strain: Low Risk    Difficulty of Paying Living Expenses: Not hard at all  Food Insecurity: No Food Insecurity   Worried About Charity fundraiser in the Last Year: Never true   Arboriculturist in the Last Year: Never true  Transportation Needs: No Transportation Needs   Lack of Transportation (Medical): No   Lack of Transportation (Non-Medical): No  Physical Activity: Insufficiently Active   Days of Exercise per Week: 3 days   Minutes of Exercise per Session: 30 min  Stress: No Stress Concern Present   Feeling of Stress : Not at all  Social Connections: Socially Isolated   Frequency of Communication with Friends and Family: More than three times a week   Frequency of Social Gatherings with Friends and Family: More than three times a week   Attends Religious Services: Never   Marine scientist or Organizations: No   Attends Archivist Meetings: Never   Marital Status: Widowed  Human resources officer Violence: Not At Risk   Fear of Current or Ex-Partner: No   Emotionally Abused: No   Physically Abused: No   Sexually Abused: No     PHYSICAL EXAM: There were no vitals filed for this visit. General: No acute distress Head:  Normocephalic/atraumatic Skin/Extremities: No rash, no edema Neurological Exam: alert and oriented to person, place, and time. No aphasia or dysarthria. Fund of knowledge is appropriate.  Recent and remote memory are intact.  Attention and concentration are normal.   Cranial nerves: Pupils equal, round. Extraocular movements intact with no nystagmus.  Visual fields full.  No  facial asymmetry.  Motor: Bulk and tone normal, muscle strength 5/5 throughout with no pronator drift.   Finger to nose testing intact.  Gait narrow-based and steady, able to tandem walk adequately.  Romberg negative.   IMPRESSION: *** Major neurocognitive disorder with facets of Alzheimer's disease and Parkinson's disease Continue Rivastigmine 3mg  BID.  Follow-up with Psychiatry and therapy for depression, delusions/hallucinations.. Continue Sertraline as per Psych. Continue physical therapy.  Recommend starting to use a walker-Rollator for safety Follow-up as scheduled with Dr. Delice Lesch March 2023  Parkinson's Disease  Continue Sinemet to 1.5 tabs TID with meals, side effects discussed Monitor increased salivation without difficulty swallowing at this time. Continue PT Will need a Rolator for stability to prevent falls.   Thank you for allowing me to participate in *** care.  Please do not hesitate to call for any questions or concerns.  The duration of this appointment visit was *** minutes of face-to-face time with the patient.  Greater than 50% of this time was spent in counseling, explanation of diagnosis, planning of further management, and coordination of care.   Ellouise Newer, M.D.   CC: ***

## 2022-02-11 NOTE — Patient Instructions (Signed)
Good to see you. ? ?Continue all your medications ? ?2. Continue follow-up with Psychiatry ? ?3. Continue with regular exercise, meals and keeping hydrated ? ?4. Follow-up in 6 months, call for any changes ? ? ?FALL PRECAUTIONS: Be cautious when walking. Scan the area for obstacles that may increase the risk of trips and falls. When getting up in the mornings, sit up at the edge of the bed for a few minutes before getting out of bed. Consider elevating the bed at the head end to avoid drop of blood pressure when getting up. Walk always in a well-lit room (use night lights in the walls). Avoid area rugs or power cords from appliances in the middle of the walkways. Use a walker or a cane if necessary and consider physical therapy for balance exercise. Get your eyesight checked regularly. ? ? ?HOME SAFETY: Consider the safety of the kitchen when operating appliances like stoves, microwave oven, and blender. Consider having supervision and share cooking responsibilities until no longer able to participate in those. Accidents with firearms and other hazards in the house should be identified and addressed as well. ? ? ?ABILITY TO BE LEFT ALONE: If patient is unable to contact 911 operator, consider using LifeLine, or when the need is there, arrange for someone to stay with patients. Smoking is a fire hazard, consider supervision or cessation. Risk of wandering should be assessed by caregiver and if detected at any point, supervision and safe proof recommendations should be instituted. ? ?MEDICATION SUPERVISION: Inability to self-administer medication needs to be constantly addressed. Implement a mechanism to ensure safe administration of the medications. ? ?RECOMMENDATIONS FOR ALL PATIENTS WITH MEMORY PROBLEMS: ?1. Continue to exercise (Recommend 30 minutes of walking everyday, or 3 hours every week) ?2. Increase social interactions - continue going to [place] and enjoy social gatherings with friends and family ?3. Eat  healthy, avoid fried foods and eat more fruits and vegetables ?4. Maintain adequate blood pressure, blood sugar, and blood cholesterol level. Reducing the risk of stroke and cardiovascular disease also helps promoting better memory. ?5. Avoid stressful situations. Live a simple life and avoid aggravations. Organize your time and prepare for the next day in anticipation. ?6. Sleep well, avoid any interruptions of sleep and avoid any distractions in the bedroom that may interfere with adequate sleep quality ?7. Avoid sugar, avoid sweets as there is a strong link between excessive sugar intake, diabetes, and cognitive impairment ?The Mediterranean diet has been shown to help patients reduce the risk of progressive memory disorders and reduces cardiovascular risk. This includes eating fish, eat fruits and green leafy vegetables, nuts like almonds and hazelnuts, walnuts, and also use olive oil. Avoid fast foods and fried foods as much as possible. Avoid sweets and sugar as sugar use has been linked to worsening of memory function. ? ?There is always a concern of gradual progression of memory problems. If this is the case, then we may need to adjust level of care according to patient needs. Support, both to the patient and caregiver, should then be put into place. ? ?

## 2022-02-16 ENCOUNTER — Other Ambulatory Visit: Payer: Self-pay

## 2022-02-16 ENCOUNTER — Encounter: Payer: Self-pay | Admitting: Psychiatry

## 2022-02-16 ENCOUNTER — Ambulatory Visit (INDEPENDENT_AMBULATORY_CARE_PROVIDER_SITE_OTHER): Payer: Medicare Other | Admitting: Psychiatry

## 2022-02-16 DIAGNOSIS — F330 Major depressive disorder, recurrent, mild: Secondary | ICD-10-CM

## 2022-02-16 DIAGNOSIS — F411 Generalized anxiety disorder: Secondary | ICD-10-CM | POA: Diagnosis not present

## 2022-02-16 MED ORDER — BUPROPION HCL ER (SR) 100 MG PO TB12: 100.0000 mg | ORAL_TABLET | Freq: Every morning | ORAL | 4 refills | Status: DC

## 2022-02-16 NOTE — Progress Notes (Signed)
[name] [id number] 1936-09-06 86 y.o.  Subjective:   Patient ID:  [name] is a 85 y.o. (DOB 1936-05-31) female.  Chief Complaint:  Chief Complaint  Patient presents with   Follow-up    Depression and anxiety    HPI [name] presents to the office today for follow-up of depression and anxiety. She is accompanied by her daughter, [name]. She reports that "things are settling... I'm adjusting." "Every day is another day to get up and get going." She reports that she has been intentional about socializing with others. She has been going to exercise class again. She reports that she continues to wake up at 5 am and then laying down and having trouble getting up and going again. Energy and motivation have improved. Daughter reports that pt will re-arrange furniture. She will walk inside facility with a friend. She reports hopeless feelings are infrequent. She reports that suicidal thoughts are "much less frequent." She reports that she gets up and goes out of her room if she starts to have negative thoughts.   She reports that she "still has some ups and downs" in mood. She reports that she is eating more of her meals in the dining room. She reports that her appetite has been good. She reports that she has been sleeping through the night. She reports some difficulty with concentration and has not been able to "get into" books. She reports occ anxiety and at times is not able to identify trigger. She reports that she woke up one day this week with anxious thoughts and this lasted "just a moment. Denies any worsening anxiety.   Daughter reports that she and her sisters have noticed some improvements in patient's mood, energy, and motivation. She reports that her mornings "seem to be better but late afternoons" she has difficulty and will fall asleep and awaken with some confusion and disorientation, ie will think she is at [facility] where she grew up or [place].  Daughter reports that facility called her at 6 am this Sunday morning after patient was saying that daughter was lost on their grounds. Daughter reports that pt will think someone is there in her apartment. She describes illusions and once thought a person with a yellow shirt was in her room and that it was a yellow pillow in her room. Daughter reports that this was the first episode of perceptual disturbance since December. Daughter reports, "we have not had a melt-down morning since starting Wellbutrin" with uncontrolled sobbing.   Confusion in afternoons started in late February and this week confusion has been less.    Sertaline Paxil Lexapro- Started in 2017 and took for about a year Prozac- Took after Lexapro and took for about a year Aricept- Adverse reaction Exelon Ativan- Adverse reaction  Mini-Mental    Flowsheet Row Office Visit from 06/19/2018 in [facility] from 12/18/2017 in [facility] from 09/26/2017 in [facility] [place]  Total Score (max 30 points ) '27 28 28      '$ PHQ2-9    Flowsheet Row Clinical Support from 12/22/2021 in [facility] [place] from 11/09/2020 in [facility] [place] from 09/16/2019 in [facility] [place] from 06/17/2019 in [place] [facility] [place] from 01/22/2019 in [facility] Total Score 1 1 0 0 1  PHQ-9 Total Score -- 1 0 0 2  Flowsheet Row ED from 06/09/2021 in [facility]  C-SSRS RISK CATEGORY Error: Question 6 not populated        Review of Systems:  Review of Systems  Musculoskeletal:  Negative for gait problem.  Neurological:  Positive for tremors.       Daughter reports that tremor seems to vary with anxiety. Daughter reports that at times tremor is not noticeable.  Psychiatric/Behavioral:         Please refer  to HPI   Medications: I have reviewed the patient's current medications.  Current Outpatient Medications  Medication Sig Dispense Refill   buPROPion ER (WELLBUTRIN SR) 100 MG 12 hr tablet Take 1 tablet (100 mg total) by mouth every morning. 30 tablet 4   CALCIUM CITRATE-VITAMIN D PO Take by mouth.     carbidopa-levodopa (SINEMET IR) 25-100 MG tablet TAKE 1 AND 1/2 TABLETS (37.5-'150MG'$ ) BY MOUTH THREE TIMES A DAY WITH MEALS 135 tablet 11   dorzolamide-timolol (COSOPT) 22.3-6.8 MG/ML ophthalmic solution dorzolamide 22.3 mg-timolol 6.8 mg/mL eye drops     fluticasone (FLONASE) 50 MCG/ACT nasal spray Place 1 spray into both nostrils daily. Up to 3 times daily as needed     Multiple Vitamin (MULTIVITAMIN) capsule Take 1 capsule by mouth daily.     psyllium (METAMUCIL SMOOTH TEXTURE) 58.6 % powder Take 1 packet by mouth daily. 283 g 12   rivastigmine (EXELON) 3 MG capsule Take 1 capsule (3 mg total) by mouth 2 (two) times daily. 60 capsule 11   sertraline (ZOLOFT) 50 MG tablet Take 1 tablet (50 mg total) by mouth daily. 90 tablet 2   No current facility-administered medications for this visit.    Medication Side Effects: Other: ? Possible increase tremor/shaking  Allergies:  Allergies  Allergen Reactions   Aricept [Donepezil Hcl]     Hallucinations, confusion   Cephalexin     REACTION: diaphoretic and drop in Blood pressure   Lorazepam Other (See Comments)    amnesia   Melatonin    Sinemet [Carbidopa-Levodopa]     confusion    Past Medical History:  Diagnosis Date   Allergic rhinitis 02/07/2008   Contact dermatitis 03/16/2013   Generalized anxiety disorder 02/19/2013   has seen [name], [name], and now [name] for counseling   Hemorrhoids, external    Hot flashes    Hyperlipemia 02/07/2008   Long standing problem. Pretreatment LDL 175 in 2006, 210 in 2010.  Had tried lipitor but question of rash after 3 days of use. Switched to zocor but reports having leg pain.  Retried Lipitor - weakness.  Currently on no medications    Hypertension    Irritable bowel syndrome    Major depressive disorder 03/18/2016   Major neurocognitive disorder 01/27/2019   possible Alzheimer's disease or mixed presentation   Menopausal symptom 11/18/2019   Palpitations 02/19/2013   Onset of symptoms March '14. EKG with NSR    Parkinson's disease    concerns given positive DaTScan    Past Medical History, Surgical history, Social history, and Family history were reviewed and updated as appropriate.   Please see review of systems for further details on the patient's review from today.   Objective:   Physical Exam:  There were no vitals taken for this visit.  Physical Exam Constitutional:      General: She is not in acute distress. Musculoskeletal:        General: No deformity.  Neurological:     Mental Status: She is  alert and oriented to person, place, and time.     Coordination: Coordination normal.  Psychiatric:        Attention and Perception: Attention and perception normal. She does not perceive auditory or visual hallucinations.        Mood and Affect: Affect is not labile, blunt, angry or inappropriate.        Speech: Speech normal.        Behavior: Behavior normal.        Thought Content: Thought content normal. Thought content is not paranoid or delusional. Thought content does not include homicidal or suicidal ideation. Thought content does not include homicidal or suicidal plan.        Cognition and Memory: Cognition is impaired. Memory is impaired.        Judgment: Judgment normal.     Comments: Insight fair Mood presents as less anxious and less depressed compared to last exam. Thought content is more positive.     Lab Review:     Component Value Date/Time   NA 141 12/22/2021 1615   K 3.9 12/22/2021 1615   CL 106 12/22/2021 1615   CO2 28 12/22/2021 1615   GLUCOSE 81 12/22/2021 1615   BUN 15 12/22/2021 1615   CREATININE 0.82 12/22/2021 1615    CALCIUM 9.0 12/22/2021 1615   PROT 6.4 12/22/2021 1615   ALBUMIN 4.1 12/22/2021 1615   AST 17 12/22/2021 1615   ALT 13 12/22/2021 1615   ALKPHOS 49 12/22/2021 1615   BILITOT 0.9 12/22/2021 1615   GFRNONAA >60 06/09/2021 1132   GFRAA >60 07/25/2020 1139       Component Value Date/Time   WBC 6.2 12/22/2021 1615   RBC 4.07 12/22/2021 1615   HGB 12.7 12/22/2021 1615   HCT 37.9 12/22/2021 1615   PLT 214.0 12/22/2021 1615   MCV 93.2 12/22/2021 1615   MCH 31.6 06/09/2021 1132   MCHC 33.4 12/22/2021 1615   RDW 13.0 12/22/2021 1615   LYMPHSABS 2.0 12/22/2021 1615   MONOABS 0.6 12/22/2021 1615   EOSABS 0.1 12/22/2021 1615   BASOSABS 0.1 12/22/2021 1615    No results found for: POCLITH, LITHIUM   No results found for: PHENYTOIN, PHENOBARB, VALPROATE, CBMZ   .res Assessment: Plan:    Pt seen for 30 minutes and time spent discussing response to Wellbutrin SR 100 mg daily and option to continue at this dose or switch to Wellbutrin XL 150 mg in the morning. Discussed that Wellbutrin XL has a longer duration compared to SR, and may be helpful during the afternoons when they report pt tends to become more tired, naps, and has some increased confusion. Daughter prefers to continue Wellbutrin SR 100 mg po qd at this time until family can discuss this further. Advised that she or her sisters could contact office if they would like for pt to transitional to Wellbutrin XL 150 mg daily.  Will continue Sertraline 50 mg po qd for depression and anxiety.  Pt to follow-up in 6-8 weeks or sooner if clinically indicated.  Patient advised to contact office with any questions, adverse effects, or acute worsening in signs and symptoms.   Minta was seen today for follow-up.  Diagnoses and all orders for this visit:  Mild episode of recurrent major depressive disorder (HCC) -     buPROPion ER (WELLBUTRIN SR) 100 MG 12 hr tablet; Take 1 tablet (100 mg total) by mouth every morning.  Generalized  anxiety disorder     Please see After Visit  Summary for patient specific instructions.  Future Appointments  Date Time Provider [place]  03/31/2022  2:30 PM Thayer Headings, PMHNP [facility] None  08/17/2022 11:30 AM Shawn Route, Coralee Pesa, PA-C [facility] None  12/27/2022  3:15 PM [facility]-NURSE HEALTH ADVISOR [facility]    No orders of the defined types were placed in this encounter.   -------------------------------

## 2022-02-18 DIAGNOSIS — N760 Acute vaginitis: Secondary | ICD-10-CM | POA: Diagnosis not present

## 2022-02-18 DIAGNOSIS — Z01419 Encounter for gynecological examination (general) (routine) without abnormal findings: Secondary | ICD-10-CM | POA: Diagnosis not present

## 2022-02-22 ENCOUNTER — Telehealth: Payer: Self-pay | Admitting: Family Medicine

## 2022-02-22 NOTE — Telephone Encounter (Signed)
[facility] pharm tech is calling and would like to know if dr Ethlyn Gallery will change medication CALCIUM CITRATE-VITAMIN D PO to chewable  ?[place], [place] [employer] Phone:  [phone]  ?Fax:  [phone]  ?  ? ?

## 2022-02-23 NOTE — Telephone Encounter (Signed)
I haven't written for this in the past; I believe she was taking otc. But ok for rx if needed. I would keep her on same dose she is taking now. ?

## 2022-02-23 NOTE — Telephone Encounter (Signed)
Spoke with [name] and informed her of the approval as below.  ?

## 2022-02-24 ENCOUNTER — Telehealth: Payer: Self-pay | Admitting: Family Medicine

## 2022-02-24 NOTE — Telephone Encounter (Signed)
Megan with [employer] is requesting a phone call back from someone regarding pt's calcium. ? ?Jinny Blossom could be contacted at [phone] ext [phone] ? ?Please advise. ?

## 2022-02-25 ENCOUNTER — Telehealth: Payer: Self-pay | Admitting: Family Medicine

## 2022-02-25 NOTE — Telephone Encounter (Signed)
I don't know what she is taking now. Was just doing otc supplement. So please see what she has been taking; then we can update our list appropriately so we can send to pharmacy for her.  ?

## 2022-02-25 NOTE — Telephone Encounter (Signed)
Please advise how patient should be taking calcium ?

## 2022-02-25 NOTE — Telephone Encounter (Signed)
[name] from [employer] call and stated she need a call back about changing Medication and want a call back at [phone] ext [phone]. ?

## 2022-02-25 NOTE — Telephone Encounter (Signed)
Spoke with patient's daughter and she stated patient is gaging on calcium tablets and would like change to gummies or chewable daughter was not sure of dose just stated whatever Otc are is what she has been taking but prefer Rx  ?

## 2022-02-25 NOTE — Telephone Encounter (Signed)
I would suggest stopping the calcium them. Typically we get enough in diet. She can still take a vitamin D (like 1000units daily) and that is a much smaller pill so will be easier to swallow. Her last vitamin D was normal and last bone density was normal. So even if she wanted to stop both calcium and vitamin D, I think that would be ok! ?

## 2022-02-26 ENCOUNTER — Emergency Department (HOSPITAL_COMMUNITY): Payer: Medicare Other

## 2022-02-26 ENCOUNTER — Inpatient Hospital Stay (HOSPITAL_COMMUNITY)
Admission: EM | Admit: 2022-02-26 | Discharge: 2022-03-01 | DRG: 073 | Disposition: A | Discharge status: Skilled Nursing Facility | Payer: Medicare Other | Source: Skilled Nursing Facility | Attending: Internal Medicine | Admitting: Internal Medicine

## 2022-02-26 ENCOUNTER — Other Ambulatory Visit: Payer: Self-pay

## 2022-02-26 ENCOUNTER — Encounter (HOSPITAL_COMMUNITY): Payer: Self-pay | Admitting: Pharmacy Technician

## 2022-02-26 DIAGNOSIS — Z818 Family history of other mental and behavioral disorders: Secondary | ICD-10-CM | POA: Diagnosis not present

## 2022-02-26 DIAGNOSIS — R55 Syncope and collapse: Secondary | ICD-10-CM | POA: Diagnosis present

## 2022-02-26 DIAGNOSIS — E569 Vitamin deficiency, unspecified: Secondary | ICD-10-CM | POA: Diagnosis not present

## 2022-02-26 DIAGNOSIS — R4189 Other symptoms and signs involving cognitive functions and awareness: Secondary | ICD-10-CM | POA: Diagnosis not present

## 2022-02-26 DIAGNOSIS — F0284 Dementia in other diseases classified elsewhere, unspecified severity, with anxiety: Secondary | ICD-10-CM | POA: Diagnosis present

## 2022-02-26 DIAGNOSIS — E785 Hyperlipidemia, unspecified: Secondary | ICD-10-CM | POA: Diagnosis present

## 2022-02-26 DIAGNOSIS — G9340 Encephalopathy, unspecified: Principal | ICD-10-CM

## 2022-02-26 DIAGNOSIS — Z9841 Cataract extraction status, right eye: Secondary | ICD-10-CM | POA: Diagnosis not present

## 2022-02-26 DIAGNOSIS — Z9842 Cataract extraction status, left eye: Secondary | ICD-10-CM

## 2022-02-26 DIAGNOSIS — I10 Essential (primary) hypertension: Secondary | ICD-10-CM | POA: Diagnosis present

## 2022-02-26 DIAGNOSIS — K589 Irritable bowel syndrome without diarrhea: Secondary | ICD-10-CM | POA: Diagnosis present

## 2022-02-26 DIAGNOSIS — G20 Parkinson's disease: Secondary | ICD-10-CM | POA: Diagnosis present

## 2022-02-26 DIAGNOSIS — Z87891 Personal history of nicotine dependence: Secondary | ICD-10-CM | POA: Diagnosis not present

## 2022-02-26 DIAGNOSIS — F0283 Dementia in other diseases classified elsewhere, unspecified severity, with mood disturbance: Secondary | ICD-10-CM | POA: Diagnosis present

## 2022-02-26 DIAGNOSIS — Z66 Do not resuscitate: Secondary | ICD-10-CM

## 2022-02-26 DIAGNOSIS — F32A Depression, unspecified: Secondary | ICD-10-CM | POA: Diagnosis present

## 2022-02-26 DIAGNOSIS — Z888 Allergy status to other drugs, medicaments and biological substances status: Secondary | ICD-10-CM

## 2022-02-26 DIAGNOSIS — R001 Bradycardia, unspecified: Secondary | ICD-10-CM

## 2022-02-26 DIAGNOSIS — G909 Disorder of the autonomic nervous system, unspecified: Principal | ICD-10-CM | POA: Diagnosis present

## 2022-02-26 DIAGNOSIS — Z8249 Family history of ischemic heart disease and other diseases of the circulatory system: Secondary | ICD-10-CM

## 2022-02-26 DIAGNOSIS — G309 Alzheimer's disease, unspecified: Secondary | ICD-10-CM | POA: Diagnosis present

## 2022-02-26 DIAGNOSIS — T43595A Adverse effect of other antipsychotics and neuroleptics, initial encounter: Secondary | ICD-10-CM | POA: Diagnosis not present

## 2022-02-26 DIAGNOSIS — Z961 Presence of intraocular lens: Secondary | ICD-10-CM | POA: Diagnosis present

## 2022-02-26 DIAGNOSIS — N760 Acute vaginitis: Secondary | ICD-10-CM | POA: Diagnosis present

## 2022-02-26 DIAGNOSIS — J309 Allergic rhinitis, unspecified: Secondary | ICD-10-CM | POA: Diagnosis present

## 2022-02-26 DIAGNOSIS — R404 Transient alteration of awareness: Secondary | ICD-10-CM | POA: Diagnosis not present

## 2022-02-26 DIAGNOSIS — Z881 Allergy status to other antibiotic agents status: Secondary | ICD-10-CM | POA: Diagnosis not present

## 2022-02-26 DIAGNOSIS — G929 Unspecified toxic encephalopathy: Secondary | ICD-10-CM | POA: Diagnosis not present

## 2022-02-26 DIAGNOSIS — G301 Alzheimer's disease with late onset: Secondary | ICD-10-CM | POA: Diagnosis not present

## 2022-02-26 DIAGNOSIS — F411 Generalized anxiety disorder: Secondary | ICD-10-CM | POA: Diagnosis present

## 2022-02-26 DIAGNOSIS — R569 Unspecified convulsions: Secondary | ICD-10-CM

## 2022-02-26 DIAGNOSIS — R4182 Altered mental status, unspecified: Secondary | ICD-10-CM | POA: Diagnosis present

## 2022-02-26 DIAGNOSIS — I9589 Other hypotension: Secondary | ICD-10-CM | POA: Diagnosis not present

## 2022-02-26 DIAGNOSIS — F0280 Dementia in other diseases classified elsewhere without behavioral disturbance: Secondary | ICD-10-CM

## 2022-02-26 DIAGNOSIS — H269 Unspecified cataract: Secondary | ICD-10-CM | POA: Diagnosis not present

## 2022-02-26 DIAGNOSIS — F329 Major depressive disorder, single episode, unspecified: Secondary | ICD-10-CM | POA: Diagnosis not present

## 2022-02-26 DIAGNOSIS — G20A1 Parkinson's disease without dyskinesia, without mention of fluctuations: Secondary | ICD-10-CM

## 2022-02-26 LAB — CBC WITH DIFFERENTIAL/PLATELET
Abs Immature Granulocytes: 0.01 10*3/uL (ref 0.00–0.07)
Basophils Absolute: 0.1 10*3/uL (ref 0.0–0.1)
Basophils Relative: 1 %
Eosinophils Absolute: 0.1 10*3/uL (ref 0.0–0.5)
Eosinophils Relative: 1 %
HCT: 41.5 % (ref 36.0–46.0)
Hemoglobin: 13.6 g/dL (ref 12.0–15.0)
Immature Granulocytes: 0 %
Lymphocytes Relative: 31 %
Lymphs Abs: 1.5 10*3/uL (ref 0.7–4.0)
MCH: 31.1 pg (ref 26.0–34.0)
MCHC: 32.8 g/dL (ref 30.0–36.0)
MCV: 95 fL (ref 80.0–100.0)
Monocytes Absolute: 0.5 10*3/uL (ref 0.1–1.0)
Monocytes Relative: 11 %
Neutro Abs: 2.7 10*3/uL (ref 1.7–7.7)
Neutrophils Relative %: 56 %
Platelets: 227 10*3/uL (ref 150–400)
RBC: 4.37 MIL/uL (ref 3.87–5.11)
RDW: 12.1 % (ref 11.5–15.5)
WBC: 4.9 10*3/uL (ref 4.0–10.5)
nRBC: 0 % (ref 0.0–0.2)

## 2022-02-26 LAB — URINALYSIS, ROUTINE W REFLEX MICROSCOPIC
Bilirubin Urine: NEGATIVE
Glucose, UA: NEGATIVE mg/dL
Hgb urine dipstick: NEGATIVE
Ketones, ur: NEGATIVE mg/dL
Leukocytes,Ua: NEGATIVE
Nitrite: NEGATIVE
Protein, ur: NEGATIVE mg/dL
Specific Gravity, Urine: <1.005 — ABNORMAL LOW (ref 1.005–1.030)
pH: 6.5 (ref 5.0–8.0)

## 2022-02-26 LAB — COMPREHENSIVE METABOLIC PANEL
ALT: 7 U/L (ref 0–44)
AST: 18 U/L (ref 15–41)
Albumin: 3.9 g/dL (ref 3.5–5.0)
Alkaline Phosphatase: 46 U/L (ref 38–126)
Anion gap: 9 (ref 5–15)
BUN: 15 mg/dL (ref 8–23)
CO2: 25 mmol/L (ref 22–32)
Calcium: 9 mg/dL (ref 8.9–10.3)
Chloride: 105 mmol/L (ref 98–111)
Creatinine, Ser: 0.92 mg/dL (ref 0.44–1.00)
GFR, Estimated: >60 mL/min (ref >60)
Glucose, Bld: 91 mg/dL (ref 70–99)
Potassium: 3.9 mmol/L (ref 3.5–5.1)
Sodium: 139 mmol/L (ref 135–145)
Total Bilirubin: 1.2 mg/dL (ref 0.3–1.2)
Total Protein: 6.3 g/dL — ABNORMAL LOW (ref 6.5–8.1)

## 2022-02-26 LAB — AMMONIA: Ammonia: 14 umol/L (ref 9–35)

## 2022-02-26 LAB — LACTIC ACID, PLASMA: Lactic Acid, Venous: 1 mmol/L (ref 0.5–1.9)

## 2022-02-26 LAB — CBG MONITORING, ED: Glucose-Capillary: 85 mg/dL (ref 70–99)

## 2022-02-26 MED ORDER — ONDANSETRON HCL 4 MG/2ML IJ SOLN: 4.0000 mg | Freq: Four times a day (QID) | INTRAMUSCULAR | Status: DC | PRN

## 2022-02-26 MED ORDER — CARBIDOPA-LEVODOPA 25-100 MG PO TABS
1.5000 | ORAL_TABLET | Freq: Three times a day (TID) | ORAL | Status: DC
  Administered 2022-02-27 – 2022-03-01 (×8): 1.5 via ORAL
  Filled 2022-02-26 (×8): qty 2

## 2022-02-26 MED ORDER — ONDANSETRON HCL 4 MG PO TABS: 4.0000 mg | ORAL_TABLET | Freq: Four times a day (QID) | ORAL | Status: DC | PRN

## 2022-02-26 MED ORDER — SODIUM CHLORIDE 0.9 % IV SOLN: INTRAVENOUS | Status: DC

## 2022-02-26 MED ORDER — ACETAMINOPHEN 650 MG RE SUPP: 650.0000 mg | Freq: Four times a day (QID) | RECTAL | Status: DC | PRN

## 2022-02-26 MED ORDER — HEPARIN SODIUM (PORCINE) 5000 UNIT/ML IJ SOLN
5000.0000 [IU] | Freq: Three times a day (TID) | INTRAMUSCULAR | Status: DC
  Administered 2022-02-27 – 2022-03-01 (×8): 5000 [IU] via SUBCUTANEOUS
  Filled 2022-02-26 (×9): qty 1

## 2022-02-26 MED ORDER — ACETAMINOPHEN 325 MG PO TABS
650.0000 mg | ORAL_TABLET | Freq: Four times a day (QID) | ORAL | Status: DC | PRN
  Administered 2022-02-27: 650 mg via ORAL
  Filled 2022-02-26: qty 2

## 2022-02-26 NOTE — Assessment & Plan Note (Signed)
Continue Exelon. ?

## 2022-02-26 NOTE — Assessment & Plan Note (Signed)
Stable.  Not on any medications. ?

## 2022-02-26 NOTE — Procedures (Signed)
History: 86 year old female being evaluated for acute unresponsiveness ? ?Sedation: None ? ?Technique: This EEG was acquired with electrodes placed according to the International 10-20 electrode system (including Fp1, Fp2, F3, F4, C3, C4, P3, P4, O1, O2, T3, T4, T5, T6, A1, A2, Fz, Cz, Pz). The following electrodes were missing or displaced: none. ? ? ?Background: The background consists predominantly of the posterior dominant rhythm of 7 Hz as well as some normally distributed bifrontal beta activity.  There is occasional mild intrusion of irregular diffuse delta range activity as well.  Sleep is not recorded.  There were no epileptiform discharges seen. ? ?Photic stimulation: Physiologic driving is not performed ? ?EEG Abnormalities: 1) mild generalized irregular slow activity ?2) slow posterior dominant rhythm ? ?Clinical Interpretation: This EEG is consistent with a mild generalized non-specific cerebral dysfunction(encephalopathy). There was no seizure or seizure predisposition recorded on this study. Please note that lack of epileptiform activity on EEG does not preclude the possibility of epilepsy.  ? ?Roland Rack, MD ?[facility] ?[phone] ? ?If 7pm- 7am, please page neurology on call as listed in [place]. ? ?

## 2022-02-26 NOTE — Assessment & Plan Note (Addendum)
Patient not hypotensive.  Seems asymptomatic from her bradycardia.  Neurology is recommended cardiac consultation. Check orthostatic VS. ?

## 2022-02-26 NOTE — Subjective & Objective (Addendum)
CC: altered mental status ?HPI: ?86 year old female with a history of Parkinson's disease, Alzheimer type dementia, hyperlipidemia, presents to the ER today with period of unresponsiveness today.  Patient was taken out of her independent living facility yesterday and had dinner at her daughter's house.  Patient returned about 7 PM.  Reportedly patient went to bed normally.  When the family went to check on her this afternoon, patient was noted to be unresponsive.  She was breathing.  Reportedly had a pulse.  Took several minutes for the patient to wake up.  Patient was brought to the ER.  Neurology was consulted.  MRI brain was negative for stroke.  EEG was negative for active seizures.  Lab work including UA, chemistry, CBC are all reassuring. ? ?Patient's daughter [name] states the patient was diagnosed with bacterial vaginosis several days ago.  She has not yet started Flagyl.  Patient not complaining of any vaginal discharge or itching.  No fever or chills.  No nausea or vomiting. ? ?[facility] contacted for admission. ?

## 2022-02-26 NOTE — ED Notes (Signed)
EEG and Dr.Kirkpatrick at bedside. ?

## 2022-02-26 NOTE — Progress Notes (Signed)
STAT EEG complete - results pending. ? ?

## 2022-02-26 NOTE — H&P (Addendum)
?History and Physical  ? ? ?[name] WUG:[id number] DOB: 14-Jan-1936 DOA: 02/26/2022 ? ?DOS: the patient was seen and examined on 02/26/2022 ? ?PCP: [name], MD  ? ?Patient coming from: [facility] ? ?I have personally briefly reviewed patient's old medical records in [place] ? ?CC: altered mental status ?HPI: ?86 year old female with a history of Parkinson's disease, Alzheimer type dementia, hyperlipidemia, presents to the ER today with period of unresponsiveness today.  Patient was taken out of her independent living facility yesterday and had dinner at her daughter's house.  Patient returned about 7 PM.  Reportedly patient went to bed normally.  When the family went to check on her this afternoon, patient was noted to be unresponsive.  She was breathing.  Reportedly had a pulse.  Took several minutes for the patient to wake up.  Patient was brought to the ER.  Neurology was consulted.  MRI brain was negative for stroke.  EEG was negative for active seizures.  Lab work including UA, chemistry, CBC are all reassuring. ? ?Patient's daughter [name] states the patient was diagnosed with bacterial vaginosis several days ago.  She has not yet started Flagyl.  Patient not complaining of any vaginal discharge or itching.  No fever or chills.  No nausea or vomiting. ? ?[facility] hospital[facility] contacted for admission.  ? ?ED Course: MRI brain negative for CVA, EEG negative for seizure ? ?Review of Systems:  ?Review of Systems  ?Unable to perform ROS: Other  poor historical recall ? ?Past Medical History:  ?Diagnosis Date  ? Allergic rhinitis 02/07/2008  ? Contact dermatitis 03/16/2013  ? Generalized anxiety disorder 02/19/2013  ? has seen [name], [name], and now [name] for counseling  ? Hemorrhoids, external   ? Hot flashes   ? Hyperlipemia 02/07/2008  ? Long standing problem. Pretreatment LDL 175 in 2006, 210 in 2010.  Had tried lipitor but question of rash after 3 days of use. Switched to  zocor but reports having leg pain. Retried Lipitor - weakness.  Currently on no medications   ? Hypertension   ? Irritable bowel syndrome   ? Major depressive disorder 03/18/2016  ? Major neurocognitive disorder 01/27/2019  ? possible Alzheimer's disease or mixed presentation  ? Menopausal symptom 11/18/2019  ? Palpitations 02/19/2013  ? Onset of symptoms March '14. EKG with NSR   ? Parkinson's disease   ? concerns given positive DaTScan  ? ? ?Past Surgical History:  ?Procedure Laterality Date  ? benign moles removed    ? CATARACT EXTRACTION W/ INTRAOCULAR LENS IMPLANT Bilateral   ? right - Nov '11, left - Nov '13 [place]  ? HEMORRHOID SURGERY    ? MANDIBLE SURGERY    ? sebaceous cyst excised    ? ? ? reports that she quit smoking about 59 years ago. Her smoking use included cigarettes. She has a 0.10 pack-year smoking history. She has never used smokeless tobacco. She reports that she does not currently use alcohol. She reports that she does not use drugs. ? ?Allergies  ?Allergen Reactions  ? Aricept [Donepezil Hcl]   ?  Hallucinations, confusion  ? Cephalexin   ?  REACTION: diaphoretic and drop in Blood pressure  ? Lorazepam Other (See Comments)  ?  amnesia  ? Melatonin   ?  Weird dreams  ? ? ?Family History  ?Problem Relation Age of Onset  ? Dementia Mother   ? Depression Mother 42  ? Heart disease Father   ?  Cancer Sister   ?     breast  ? Heart disease Other   ? Heart disease Other   ? Heart disease Other   ? Cancer Sister   ?     breast  ? Hypothyroidism Sister   ? Asthma Sister   ? Rheum arthritis Paternal Grandmother   ? Rheum arthritis Daughter   ? ? ?Prior to Admission medications   ?Medication Sig Start Date End Date Taking? Authorizing Provider  ?buPROPion ER (WELLBUTRIN SR) 100 MG 12 hr tablet Take 1 tablet (100 mg total) by mouth every morning. 02/16/22   Thayer Headings, [place]  ?CALCIUM CITRATE-VITAMIN D PO Take by mouth.    [provider]  ?carbidopa-levodopa (SINEMET IR) 25-100 MG  tablet TAKE 1 AND 1/2 TABLETS (37.5-'150MG'$ ) BY MOUTH THREE TIMES A DAY WITH MEALS 02/11/22   Cameron Sprang, MD  ?dorzolamide-timolol (COSOPT) 22.3-6.8 MG/ML ophthalmic solution dorzolamide 22.3 mg-timolol 6.8 mg/mL eye drops    [provider]  ?fluticasone (FLONASE) 50 MCG/ACT nasal spray Place 1 spray into both nostrils daily. Up to 3 times daily as needed    [provider]  ?Multiple Vitamin (MULTIVITAMIN) capsule Take 1 capsule by mouth daily.    [provider]  ?psyllium (METAMUCIL SMOOTH TEXTURE) 58.6 % powder Take 1 packet by mouth daily. 08/03/20   [name], MD  ?rivastigmine (EXELON) 3 MG capsule Take 1 capsule (3 mg total) by mouth 2 (two) times daily. 02/11/22   Cameron Sprang, MD  ?sertraline (ZOLOFT) 50 MG tablet Take 1 tablet (50 mg total) by mouth daily. 01/11/22   Thayer Headings, [place]  ? ? ?Physical Exam: ?Vitals:  ? 02/26/22 1700 02/26/22 1815 02/26/22 1845 02/26/22 1900  ?BP: (!) 179/67 (!) 199/71 (!) 160/68 (!) 163/65  ?Pulse: (!) 44 (!) 57 60 (!) 58  ?Resp: '14 11 13 16  '$ ?Temp:      ?TempSrc:      ?SpO2: 97% 99% 98% 96%  ? ? ?Physical Exam ?Vitals and nursing note reviewed.  ?Constitutional:   ?   General: She is not in acute distress. ?   Appearance: Normal appearance. She is normal weight. She is not ill-appearing, toxic-appearing or diaphoretic.  ?HENT:  ?   Head: Normocephalic and atraumatic.  ?   Nose: Nose normal. No rhinorrhea.  ?Eyes:  ?   General: No scleral icterus. ?Cardiovascular:  ?   Rate and Rhythm: Regular rhythm. Bradycardia present.  ?   Pulses: Normal pulses.  ?Pulmonary:  ?   Effort: Pulmonary effort is normal. No respiratory distress.  ?   Breath sounds: Normal breath sounds. No wheezing or rales.  ?Abdominal:  ?   General: Abdomen is flat. Bowel sounds are normal. There is no distension.  ?   Palpations: Abdomen is soft.  ?   Tenderness: There is no abdominal tenderness. There is no guarding.  ?Musculoskeletal:  ?   Right lower leg: No  edema.  ?   Left lower leg: No edema.  ?Skin: ?   General: Skin is warm and dry.  ?   Capillary Refill: Capillary refill takes less than 2 seconds.  ?Neurological:  ?   Mental Status: She is disoriented.  ?  ? ?Labs on Admission: I have personally reviewed following labs and imaging studies ? ?CBC: ?Recent Labs  ?Lab 02/26/22 ?1532  ?WBC 4.9  ?NEUTROABS 2.7  ?HGB 13.6  ?HCT 41.5  ?MCV 95.0  ?PLT 227  ? ?Basic Metabolic Panel: ?Recent  Labs  ?Lab 02/26/22 ?1532  ?NA 139  ?K 3.9  ?CL 105  ?CO2 25  ?GLUCOSE 91  ?BUN 15  ?CREATININE 0.92  ?CALCIUM 9.0  ? ?GFR: ?CrCl cannot be calculated (Unknown ideal weight.). ?Liver Function Tests: ?Recent Labs  ?Lab 02/26/22 ?1532  ?AST 18  ?ALT 7  ?ALKPHOS 46  ?BILITOT 1.2  ?PROT 6.3*  ?ALBUMIN 3.9  ? ?No results for input(s): LIPASE, AMYLASE in the last 168 hours. ?Recent Labs  ?Lab 02/26/22 ?1610  ?AMMONIA 14  ? ?Coagulation Profile: ?No results for input(s): INR, PROTIME in the last 168 hours. ?Cardiac Enzymes: ?No results for input(s): CKTOTAL, CKMB, CKMBINDEX, TROPONINI, TROPONINIHS in the last 168 hours. ?BNP (last 3 results) ?No results for input(s): PROBNP in the last 8760 hours. ?HbA1C: ?No results for input(s): HGBA1C in the last 72 hours. ?CBG: ?Recent Labs  ?Lab 02/26/22 ?1547  ?GLUCAP 85  ? ?Lipid Profile: ?No results for input(s): CHOL, HDL, LDLCALC, TRIG, CHOLHDL, LDLDIRECT in the last 72 hours. ?Thyroid Function Tests: ?No results for input(s): TSH, T4TOTAL, FREET4, T3FREE, THYROIDAB in the last 72 hours. ?Anemia Panel: ?No results for input(s): VITAMINB12, FOLATE, FERRITIN, TIBC, IRON, RETICCTPCT in the last 72 hours. ?Urine analysis: ?   ?Component Value Date/Time  ? COLORURINE YELLOW 02/26/2022 1532  ? APPEARANCEUR CLEAR 02/26/2022 1532  ? LABSPEC <1.005 (L) 02/26/2022 1532  ? PHURINE 6.5 02/26/2022 1532  ? GLUCOSEU NEGATIVE 02/26/2022 1532  ? HGBUR NEGATIVE 02/26/2022 1532  ? BILIRUBINUR NEGATIVE 02/26/2022 1532  ? BILIRUBINUR negative 04/06/2021 1532  ? KETONESUR  NEGATIVE 02/26/2022 1532  ? PROTEINUR NEGATIVE 02/26/2022 1532  ? UROBILINOGEN 0.2 04/06/2021 1532  ? UROBILINOGEN 0.2 02/05/2013 1459  ? NITRITE NEGATIVE 02/26/2022 1532  ? LEUKOCYTESUR NEGATIVE 02/27/19

## 2022-02-26 NOTE — ED Notes (Signed)
EEG complete

## 2022-02-26 NOTE — Consult Note (Signed)
Neurology Consultation ?Reason for Consult: Altered mental status ?Referring Physician: Loleta Chance ? ?CC: Altered mental status ? ?History is obtained from: Daughter ? ?HPI: [name] is a 86 y.o. female with a history of Parkinson's and dementia who presents with an episode of unresponsiveness.  She was seen walking around 1 PM.  When the daughter arrived at two, she found the patient unresponsive.  She actually had the presence of mind to videotape her interactions with the patient, and she is calling her and shaking her without response from the patient.  EMS was called and she was brought into the emergency department.  Since that time, she has been gradually improving.  Due to concern initially for possible seizure, stat EEG was obtained which was negative for seizure. ? ?Also of note, she has been bradycardic. ? ?She does have a hemianopia on exam, but the patient reports that she has long had an issue of only being able to see half of pictures. ? ? ?LKW: 1 PM ?tpa given?: no, improving symptoms ? ? ?ROS: Unable to obtain due to altered mental status. ? ?Past Medical History:  ?Diagnosis Date  ? Allergic rhinitis 02/07/2008  ? Contact dermatitis 03/16/2013  ? Generalized anxiety disorder 02/19/2013  ? has seen [name], [name], and now [name] for counseling  ? Hemorrhoids, external   ? Hot flashes   ? Hyperlipemia 02/07/2008  ? Long standing problem. Pretreatment LDL 175 in 2006, 210 in 2010.  Had tried lipitor but question of rash after 3 days of use. Switched to zocor but reports having leg pain. Retried Lipitor - weakness.  Currently on no medications   ? Hypertension   ? Irritable bowel syndrome   ? Major depressive disorder 03/18/2016  ? Major neurocognitive disorder 01/27/2019  ? possible Alzheimer's disease or mixed presentation  ? Menopausal symptom 11/18/2019  ? Palpitations 02/19/2013  ? Onset of symptoms March '14. EKG with NSR   ? Parkinson's disease   ? concerns given positive  DaTScan  ? ? ? ?Family History  ?Problem Relation Age of Onset  ? Dementia Mother   ? Depression Mother 14  ? Heart disease Father   ? Cancer Sister   ?     breast  ? Heart disease Other   ? Heart disease Other   ? Heart disease Other   ? Cancer Sister   ?     breast  ? Hypothyroidism Sister   ? Asthma Sister   ? Rheum arthritis Paternal Grandmother   ? Rheum arthritis Daughter   ? ? ? ?Social History:  reports that she quit smoking about 59 years ago. Her smoking use included cigarettes. She has a 0.10 pack-year smoking history. She has never used smokeless tobacco. She reports that she does not currently use alcohol. She reports that she does not use drugs. ? ? ?Exam: ?Current vital signs: ?BP (!) 187/88   Pulse (!) 45   Temp (!) 97.5 ?F (36.4 ?C) (Rectal)   Resp 16   SpO2 97%  ?Vital signs in last 24 hours: ?Temp:  [97.5 ?F (36.4 ?C)] 97.5 ?F (36.4 ?C) (03/18 1607) ?Pulse Rate:  [45-46] 45 (03/18 1626) ?Resp:  [16-20] 16 (03/18 1626) ?BP: (137-187)/(88-101) 187/88 (03/18 1626) ?SpO2:  [97 %-100 %] 97 % (03/18 1626) ? ? ?Physical Exam  ?Constitutional: Appears elderly ? ?Neuro: ?Mental Status: ?Patient is lethargic but arousable.  She is not oriented. ?Cranial Nerves: ?II: She is a right hemianopia. Pupils are  equal, round, and reactive to light.   ?III,IV, VI: EOMI without ptosis or diploplia.  ?V: Facial sensation is symmetric to temperature ?VII: Facial movement is symmetric.  ?VIII: hearing is intact to voice ?XII: tongue is midline without atrophy or fasciculations.  ?Motor: ?She has symmetric strength throughout, though she gives poor effort in her upper extremities ?Sensory: ?Sensation is symmetric to light touch and temperature in the arms and legs. ?Cerebellar: ?No clear ataxia ? ? ? ? ?I have reviewed labs in epic and the results pertinent to this consultation are: ?CBC-unremarkable ? ? ? ?Impression: 86 year old female with episode of unresponsiveness of unclear etiology.  Certainly with her pulse  being in the 40s, I am concerned for possible symptomatic bradycardia.  Seizure would also be another possibility, though no definite evidence of this and so with negative EEG I would not recommend starting antiepileptic therapy. ? ?Recommendations: ?1) MRI brain ?2) telemetry monitoring, consider cardiac consultation ?3) neurology will continue to follow ? ? ?Roland Rack, MD ?[facility] ?[phone] ? ?If 7pm- 7am, please page neurology on call as listed in [employer]. ? ?

## 2022-02-26 NOTE — Assessment & Plan Note (Signed)
Continue Sinemet.  Removed the patient's intolerance to Sinemet off her allergy list.  Patient's daughter [name] states the patient takes Sinemet without difficulty. ?

## 2022-02-26 NOTE — Assessment & Plan Note (Addendum)
Observation telemetry bed. EEG negative for seizures or any abnormal brainwave activity.  MRI brain was negative for stroke.  Labs reassuring.  UA negative for infection.  No dehydration.  Neurology has seen the patient.  Neurology has recommended cardiac consultation to the patient's bradycardia.  Will check echo.  Placed on telemetry.  May need outpatient Zio patch. ?

## 2022-02-26 NOTE — ED Triage Notes (Addendum)
Pt bib ems from [facility] for AMS last know well at 2000. Pt daughter found her approx 1430 lying sideways in the bed. Daughter tried to awaken pt with minimal response. Pt with hx parkinsons and dementia, but normally able to function on her own. 22g LAC.  ?HR 44 NSR ?BP 190/90 ?CBG 110 ?

## 2022-02-26 NOTE — ED Provider Notes (Signed)
?[facility] ?Provider Note ? ? ?CSN: [id number] ?Arrival date & time:    ? ?  ? ?History ? ?Chief Complaint  ?Patient presents with  ? Altered Mental Status  ? ? ?[name] is a 86 y.o. female. ? ?HPI ? ?(From [facility]),This patient is a very pleasant 86 year old female, she comes from a nursing facility where she was found to have acute onset of altered mental status.  She was last out with her family member yesterday when they went to lunch, seem to be her normal self.  She was found by staff to be laying on her bed in an awkward way.  She was not positioned appropriately, she would not respond to her words, she did not appear to have any seizure activity but would not talk and seem to be very stiff.  When the paramedics found her they noted her to be normoglycemic and have no hypertension hypotension or tachycardia in fact had a relative bradycardia between 40 and 60 bpm.  She was not given any medication prehospital but placed on a cardiac monitor, on arrival the patient who does have dementia and reports that she does not know why she is here she does not know that she is in the hospital but knows that she is in an "corridor".  She is to be taking several medications including Sinemet, rivastigmine, Zoloft, Wellbutrin. ? ?The patient did not appear to be focal nor seizing nor have any urinary incontinence according to the paramedics. ? ?Per daughter - was fine last night - nurse gave morning meds - she was up and around walking - then laid down on the bed - when sister arrived at 1:50 - she was unresponsive - in a deep sleep with fluttering of the eyelids - not responding to voice or touch ? ?Last week at GYN - had some labs done - on Thursday -= told she had a vaginal bacterial infection - has not been given the flagyl yet.   ? ?Home Medications ?Prior to Admission medications   ?Medication Sig Start Date End Date Taking? Authorizing Provider  ?CALCIUM  CITRATE-VITAMIN D PO Take 1 capsule by mouth every evening.   Yes [provider]  ?carbidopa-levodopa (SINEMET IR) 25-100 MG tablet TAKE 1 AND 1/2 TABLETS (37.5-'150MG'$ ) BY MOUTH THREE TIMES A DAY WITH MEALS ?Patient taking differently: Take 1 tablet by mouth 3 (three) times daily. 0900, 1400, and 2100 02/11/22  Yes Cameron Sprang, MD  ?dorzolamide-timolol (COSOPT) 22.3-6.8 MG/ML ophthalmic solution Place 1 drop into both eyes 3 (three) times daily.   Yes [provider]  ?fluticasone (FLONASE) 50 MCG/ACT nasal spray Place 1 spray into both nostrils in the morning, at noon, and at bedtime.   Yes [provider]  ?Multiple Vitamin (MULTIVITAMIN) capsule Take 1 capsule by mouth at bedtime.   Yes [provider]  ?psyllium (METAMUCIL SMOOTH TEXTURE) 58.6 % powder Take 1 packet by mouth daily. 08/03/20  Yes Koberlein, Steele Berg, MD  ?rivastigmine (EXELON) 3 MG capsule Take 1 capsule (3 mg total) by mouth 2 (two) times daily. 02/11/22  Yes Cameron Sprang, MD  ?sertraline (ZOLOFT) 50 MG tablet Take 1 tablet (50 mg total) by mouth daily. ?Patient taking differently: Take 50 mg by mouth at bedtime. 01/11/22  Yes Thayer Headings, PMHNP  ?buPROPion ER [facility] SR) 100 MG 12 hr tablet Take 1 tablet (100 mg total) by mouth every morning. ?Patient not taking: Reported on 02/27/2022 02/16/22  Thayer Headings, PMHNP  ?   ? ?Allergies    ?Aricept [donepezil hcl], Cephalexin, Lorazepam, and Melatonin   ? ?Review of Systems   ?Review of Systems  ?Unable to perform ROS: Dementia  ? ?Physical Exam ?Updated Vital Signs ?BP 134/74 (BP Location: Right Arm)   Pulse 73   Temp 98.1 ?F (36.7 ?C) (Axillary)   Resp 16   Ht 1.6 m ('5\' 3"'$ )   Wt 54.1 kg   SpO2 94%   BMI 21.12 kg/m?  ?Physical Exam ?Vitals and nursing note reviewed.  ?Constitutional:   ?   General: She is not in acute distress. ?   Appearance: She is well-developed.  ?HENT:  ?   Head: Normocephalic and atraumatic.  ?   Nose: No congestion or  rhinorrhea.  ?   Mouth/Throat:  ?   Mouth: Mucous membranes are dry.  ?   Pharynx: No oropharyngeal exudate.  ?Eyes:  ?   General: No scleral icterus.    ?   Right eye: No discharge.     ?   Left eye: No discharge.  ?   Conjunctiva/sclera: Conjunctivae normal.  ?   Pupils: Pupils are equal, round, and reactive to light.  ?Neck:  ?   Thyroid: No thyromegaly.  ?   Vascular: No carotid bruit or JVD.  ?Cardiovascular:  ?   Rate and Rhythm: Regular rhythm. Bradycardia present.  ?   Heart sounds: Normal heart sounds. No murmur heard. ?  No friction rub. No gallop.  ?Pulmonary:  ?   Effort: Pulmonary effort is normal. No respiratory distress.  ?   Breath sounds: Normal breath sounds. No wheezing or rales.  ?Abdominal:  ?   General: Bowel sounds are normal. There is no distension.  ?   Palpations: Abdomen is soft. There is no mass.  ?   Tenderness: There is no abdominal tenderness.  ?Musculoskeletal:     ?   General: No tenderness. Normal range of motion.  ?   Cervical back: Normal range of motion and neck supple. No rigidity or tenderness.  ?   Right lower leg: No edema.  ?   Left lower leg: No edema.  ?Lymphadenopathy:  ?   Cervical: No cervical adenopathy.  ?Skin: ?   General: Skin is warm and dry.  ?   Findings: No erythema or rash.  ?Neurological:  ?   Mental Status: She is alert.  ?   Coordination: Coordination normal.  ?   Comments: The patient is somnolent but easily arousable to touch, over the course of 5 or 6 minutes the patient became more and more awake and ultimately was able to speak with more clear speech, open her eyes as if she was not as sleepy and move her arms and legs albeit very slowly and with some cogwheel rigidity.  She is able to count my fingers identify objects and answer simple questions such as the name of her daughter and her own birthdate.  She does not appear to be focal she has symmetrical strength in all 4 extremities.  When I asked her to lift her arm she lifted about an inch off the  ground.  When I lifted myself and hold it above her head she is able to keep it there for an extended period of time.  Same with both of her legs.  There is not appear to be any facial droop, she does have stone facies  ?Psychiatric:     ?   Behavior:  Behavior normal.  ? ? ?ED Results / Procedures / Treatments   ?Labs ?(all labs ordered are listed, but only abnormal results are displayed) ?Labs Reviewed  ?COMPREHENSIVE METABOLIC PANEL - Abnormal; Notable for the following components:  ?    Result Value  ? Total Protein 6.3 (*)   ? All other components within normal limits  ?URINALYSIS, ROUTINE W REFLEX MICROSCOPIC - Abnormal; Notable for the following components:  ? Specific Gravity, Urine <1.005 (*)   ? All other components within normal limits  ?MRSA NEXT GEN BY PCR, NASAL  ?URINE CULTURE  ?CBC WITH DIFFERENTIAL/PLATELET  ?AMMONIA  ?LACTIC ACID, PLASMA  ?CBG MONITORING, ED  ?CBG MONITORING, ED  ? ? ?EKG ?EKG Interpretation ? ?Date/Time:  Saturday February 26 2022 15:39:31 EDT ?Ventricular Rate:  42 ?PR Interval:  192 ?QRS Duration: 95 ?QT Interval:  457 ?QTC Calculation: 382 ?R Axis:   14 ?Text Interpretation: Sinus bradycardia Borderline T abnormalities, anterior leads Since last tracing rate faster (was 55) Confirmed by Noemi Chapel [phone]) on 02/26/2022 3:42:44 PM ? ?Radiology ?MR BRAIN WO CONTRAST ? ?Result Date: 02/26/2022 ?CLINICAL DATA:  Encephalopathy EXAM: MRI HEAD WITHOUT CONTRAST TECHNIQUE: Multiplanar, multiecho pulse sequences of the brain and surrounding structures were obtained without intravenous contrast. COMPARISON:  None. FINDINGS: Brain: No acute infarct, mass effect or extra-axial collection. No acute or chronic hemorrhage. There is multifocal hyperintense T2-weighted signal within the white matter. Generalized volume loss without a clear lobar predilection. The midline structures are normal. Vascular: Major flow voids are preserved. Skull and upper cervical spine: Normal calvarium and skull base.  Visualized upper cervical spine and soft tissues are normal. Sinuses/Orbits:No paranasal sinus fluid levels or advanced mucosal thickening. No mastoid or middle ear effusion. Normal orbits. IMPRESSION: 1. No acute intracrania

## 2022-02-26 NOTE — Assessment & Plan Note (Signed)
Verified DNR with the daughter [name]. ? ? ? ?

## 2022-02-26 NOTE — ED Notes (Signed)
Pt more awake and alert at this time. Family remains at the bedside.  ?

## 2022-02-27 ENCOUNTER — Observation Stay (HOSPITAL_BASED_OUTPATIENT_CLINIC_OR_DEPARTMENT_OTHER): Payer: Medicare Other

## 2022-02-27 ENCOUNTER — Encounter (HOSPITAL_COMMUNITY): Payer: Self-pay | Admitting: Internal Medicine

## 2022-02-27 DIAGNOSIS — F0283 Dementia in other diseases classified elsewhere, unspecified severity, with mood disturbance: Secondary | ICD-10-CM | POA: Diagnosis present

## 2022-02-27 DIAGNOSIS — Z9841 Cataract extraction status, right eye: Secondary | ICD-10-CM | POA: Diagnosis not present

## 2022-02-27 DIAGNOSIS — K589 Irritable bowel syndrome without diarrhea: Secondary | ICD-10-CM | POA: Diagnosis present

## 2022-02-27 DIAGNOSIS — Z818 Family history of other mental and behavioral disorders: Secondary | ICD-10-CM | POA: Diagnosis not present

## 2022-02-27 DIAGNOSIS — R55 Syncope and collapse: Secondary | ICD-10-CM | POA: Diagnosis not present

## 2022-02-27 DIAGNOSIS — R001 Bradycardia, unspecified: Secondary | ICD-10-CM | POA: Diagnosis not present

## 2022-02-27 DIAGNOSIS — R4182 Altered mental status, unspecified: Secondary | ICD-10-CM | POA: Diagnosis present

## 2022-02-27 DIAGNOSIS — Z881 Allergy status to other antibiotic agents status: Secondary | ICD-10-CM | POA: Diagnosis not present

## 2022-02-27 DIAGNOSIS — I10 Essential (primary) hypertension: Secondary | ICD-10-CM | POA: Diagnosis present

## 2022-02-27 DIAGNOSIS — Z9842 Cataract extraction status, left eye: Secondary | ICD-10-CM | POA: Diagnosis not present

## 2022-02-27 DIAGNOSIS — T43595A Adverse effect of other antipsychotics and neuroleptics, initial encounter: Secondary | ICD-10-CM | POA: Diagnosis not present

## 2022-02-27 DIAGNOSIS — G9340 Encephalopathy, unspecified: Secondary | ICD-10-CM | POA: Diagnosis not present

## 2022-02-27 DIAGNOSIS — G20 Parkinson's disease: Secondary | ICD-10-CM | POA: Diagnosis present

## 2022-02-27 DIAGNOSIS — F329 Major depressive disorder, single episode, unspecified: Secondary | ICD-10-CM | POA: Diagnosis not present

## 2022-02-27 DIAGNOSIS — N760 Acute vaginitis: Secondary | ICD-10-CM | POA: Diagnosis present

## 2022-02-27 DIAGNOSIS — I9589 Other hypotension: Secondary | ICD-10-CM

## 2022-02-27 DIAGNOSIS — G909 Disorder of the autonomic nervous system, unspecified: Secondary | ICD-10-CM | POA: Diagnosis present

## 2022-02-27 DIAGNOSIS — Z961 Presence of intraocular lens: Secondary | ICD-10-CM | POA: Diagnosis present

## 2022-02-27 DIAGNOSIS — E569 Vitamin deficiency, unspecified: Secondary | ICD-10-CM | POA: Diagnosis not present

## 2022-02-27 DIAGNOSIS — J309 Allergic rhinitis, unspecified: Secondary | ICD-10-CM | POA: Diagnosis present

## 2022-02-27 DIAGNOSIS — F0284 Dementia in other diseases classified elsewhere, unspecified severity, with anxiety: Secondary | ICD-10-CM | POA: Diagnosis present

## 2022-02-27 DIAGNOSIS — F411 Generalized anxiety disorder: Secondary | ICD-10-CM | POA: Diagnosis present

## 2022-02-27 DIAGNOSIS — F0280 Dementia in other diseases classified elsewhere without behavioral disturbance: Secondary | ICD-10-CM | POA: Diagnosis not present

## 2022-02-27 DIAGNOSIS — F32A Depression, unspecified: Secondary | ICD-10-CM | POA: Diagnosis present

## 2022-02-27 DIAGNOSIS — E785 Hyperlipidemia, unspecified: Secondary | ICD-10-CM | POA: Diagnosis present

## 2022-02-27 DIAGNOSIS — G929 Unspecified toxic encephalopathy: Secondary | ICD-10-CM | POA: Diagnosis not present

## 2022-02-27 DIAGNOSIS — Z888 Allergy status to other drugs, medicaments and biological substances status: Secondary | ICD-10-CM | POA: Diagnosis not present

## 2022-02-27 DIAGNOSIS — Z8249 Family history of ischemic heart disease and other diseases of the circulatory system: Secondary | ICD-10-CM | POA: Diagnosis not present

## 2022-02-27 DIAGNOSIS — H269 Unspecified cataract: Secondary | ICD-10-CM | POA: Diagnosis not present

## 2022-02-27 DIAGNOSIS — G301 Alzheimer's disease with late onset: Secondary | ICD-10-CM | POA: Diagnosis not present

## 2022-02-27 DIAGNOSIS — Z87891 Personal history of nicotine dependence: Secondary | ICD-10-CM | POA: Diagnosis not present

## 2022-02-27 DIAGNOSIS — Z66 Do not resuscitate: Secondary | ICD-10-CM | POA: Diagnosis present

## 2022-02-27 DIAGNOSIS — G309 Alzheimer's disease, unspecified: Secondary | ICD-10-CM | POA: Diagnosis present

## 2022-02-27 LAB — ECHOCARDIOGRAM COMPLETE
AR max vel: 2.39 cm2
AV Area VTI: 2.38 cm2
AV Area mean vel: 2.33 cm2
AV Mean grad: 2 mmHg
AV Peak grad: 4.2 mmHg
Ao pk vel: 1.03 m/s
Area-P 1/2: 3.23 cm2
Height: 63 in
S' Lateral: 2.6 cm
Weight: 1907.2 oz

## 2022-02-27 LAB — MRSA NEXT GEN BY PCR, NASAL: MRSA by PCR Next Gen: NOT DETECTED

## 2022-02-27 MED ORDER — FLUTICASONE PROPIONATE 50 MCG/ACT NA SUSP
1.0000 | Freq: Three times a day (TID) | NASAL | Status: DC | PRN
  Filled 2022-02-27: qty 16

## 2022-02-27 MED ORDER — DORZOLAMIDE HCL-TIMOLOL MAL 2-0.5 % OP SOLN
1.0000 [drp] | Freq: Three times a day (TID) | OPHTHALMIC | Status: DC
  Administered 2022-02-27 – 2022-03-01 (×7): 1 [drp] via OPHTHALMIC
  Filled 2022-02-27: qty 10

## 2022-02-27 MED ORDER — ADULT MULTIVITAMIN W/MINERALS CH
1.0000 | ORAL_TABLET | Freq: Every day | ORAL | Status: DC
  Filled 2022-02-27 (×2): qty 1

## 2022-02-27 MED ORDER — BUPROPION HCL ER (SR) 100 MG PO TB12
100.0000 mg | ORAL_TABLET | Freq: Every morning | ORAL | Status: DC
  Administered 2022-02-27 – 2022-03-01 (×3): 100 mg via ORAL
  Filled 2022-02-27 (×3): qty 1

## 2022-02-27 MED ORDER — SERTRALINE HCL 50 MG PO TABS
50.0000 mg | ORAL_TABLET | Freq: Every day | ORAL | Status: DC
  Administered 2022-02-27 – 2022-02-28 (×2): 50 mg via ORAL
  Filled 2022-02-27 (×2): qty 1

## 2022-02-27 MED ORDER — PSYLLIUM 95 % PO PACK
1.0000 | PACK | Freq: Every day | ORAL | Status: DC
  Administered 2022-02-27 – 2022-03-01 (×3): 1 via ORAL
  Filled 2022-02-27: qty 1
  Filled 2022-02-27 (×2): qty 30
  Filled 2022-02-27: qty 1

## 2022-02-27 NOTE — Progress Notes (Signed)
?      ?                 PROGRESS NOTE ? ?      ?PATIENT DETAILS ?Name: [name] ?Age: 86 y.o. ?Sex: female ?Date of Birth: 27-Jul-1936 ?Admit Date: 02/26/2022 ?Admitting Physician Kristopher Oppenheim, DO ?RSW:NIOEVOJJK, Steele Berg, MD ? ?Brief Summary: ?Patient is a 86 y.o.  female with history of Parkinson disease, dementia-who presented with a episode of unresponsiveness that lasted approximately 20 minutes.  She was subsequently admitted to the hospitalist service for further evaluation and treatment. ? ? ?Significant events: ?3/18>> brought to ED by EMS after daughter found patient at assisted living facility [facility]) unresponsive.  Bradycardic to the 40s. ? ?Significant studies: ?3/18>> MRI brain: No acute CVA ?3/18>> CXR: No PNA ?3/18>> Spot EEG: No seizures ? ?Significant microbiology data: ? ? ?Procedures: ? ? ?Consults: ?Neurology, cardiology ? ?Subjective: ?Pleasantly confused. ? ?Objective: ?Vitals: ?Blood pressure (!) 144/67, pulse 60, temperature 97.9 ?F (36.6 ?C), temperature source Oral, resp. rate 16, height '5\' 3"'$  (1.6 m), weight 54.1 kg, SpO2 94 %.  ? ?Exam: ?Gen Exam:Alert awake-not in any distress ?HEENT:atraumatic, normocephalic ?Chest: B/L clear to auscultation anteriorly ?CVS:S1S2 regular ?Abdomen:soft non tender, non distended ?Extremities:no edema ?Neurology: Non focal ?Skin: no rash ? ?Pertinent Labs/Radiology: ?CBC Latest Ref Rng & Units 02/26/2022 12/22/2021 06/09/2021  ?WBC 4.0 - 10.5 K/uL 4.9 6.2 4.6  ?Hemoglobin 12.0 - 15.0 g/dL 13.6 12.7 12.4  ?Hematocrit 36.0 - 46.0 % 41.5 37.9 37.0  ?Platelets 150 - 400 K/uL 227 214.0 195  ?  ?Lab Results  ?Component Value Date  ? NA 139 02/26/2022  ? K 3.9 02/26/2022  ? CL 105 02/26/2022  ? CO2 25 02/26/2022  ?  ? ? ?Assessment/Plan: ?Unresponsiveness/?  Syncope: Unclear etiology-suspect probably related to autonomic dysfunction in the setting of Parkinson's disease.  She was orthostatic after 3 minutes of standing this morning. Telemetry reviewed-no  major issues overnight.  MRI brain without any acute abnormalities-EEG without seizures.  Await echo-ambulate with PT-we will await formal evaluation by cardiology-but suspect given advanced age/dementia-even if significant arrhythmias/bradycardia is detected-probably better to be managed conservatively ? ?Sinus bradycardia: Probably a sequelae of autonomic dysfunction in the setting of Parkinson's disease.  However is on rivastigmine that could cause bradycardia as well.  TSH within normal limits.  Heart rate stable in the 60s-70s this morning. ? ?Parkinson's disease: On Sinemet ? ?Dementia: Supportive care-expect delirium while hospitalized.  Hold rivastigmine-continue Wellbutrin/Zoloft ? ?BMI: ?Estimated body mass index is 21.12 kg/m? as calculated from the following: ?  Height as of this encounter: '5\' 3"'$  (1.6 m). ?  Weight as of this encounter: 54.1 kg.  ? ?Code status: ?  Code Status: DNR  ? ?DVT Prophylaxis: ?heparin injection 5,000 Units Start: 02/27/22 0600 ?SCDs Start: 02/26/22 2307 ? ?Family Communication: Daughter at bedside ? ? ?Disposition Plan: ?Status is: Observation ?The patient remains OBS appropriate and will d/c before 2 midnights.  Await echo-formal cardiology evaluation-PT evaluation-to determine safe disposition before consideration of discharge. ?  ?Planned Discharge Destination:Assisted living ? ? ?Diet: ?Diet Order   ? ?       ?  Diet regular Room service appropriate? Yes; Fluid consistency: Thin  Diet effective now       ?  ? ?  ?  ? ?  ?  ? ? ?Antimicrobial agents: ?Anti-infectives (From admission, onward)  ? ? None  ? ?  ? ? ? ?MEDICATIONS: ?Scheduled Meds: ?  carbidopa-levodopa  1.5 tablet Oral TID WC  ? heparin  5,000 Units Subcutaneous Q8H  ? ?Continuous Infusions: ?PRN Meds:.acetaminophen **OR** acetaminophen, ondansetron **OR** ondansetron (ZOFRAN) IV ? ? ?I have personally reviewed following labs and imaging studies ? ?LABORATORY DATA: ?CBC: ?Recent Labs  ?Lab 02/26/22 ?1532  ?WBC  4.9  ?NEUTROABS 2.7  ?HGB 13.6  ?HCT 41.5  ?MCV 95.0  ?PLT 227  ? ? ?Basic Metabolic Panel: ?Recent Labs  ?Lab 02/26/22 ?1532  ?NA 139  ?K 3.9  ?CL 105  ?CO2 25  ?GLUCOSE 91  ?BUN 15  ?CREATININE 0.92  ?CALCIUM 9.0  ? ? ?GFR: ?Estimated Creatinine Clearance: 37.7 mL/min (by C-G formula based on SCr of 0.92 mg/dL). ? ?Liver Function Tests: ?Recent Labs  ?Lab 02/26/22 ?1532  ?AST 18  ?ALT 7  ?ALKPHOS 46  ?BILITOT 1.2  ?PROT 6.3*  ?ALBUMIN 3.9  ? ?No results for input(s): LIPASE, AMYLASE in the last 168 hours. ?Recent Labs  ?Lab 02/26/22 ?1610  ?AMMONIA 14  ? ? ?Coagulation Profile: ?No results for input(s): INR, PROTIME in the last 168 hours. ? ?Cardiac Enzymes: ?No results for input(s): CKTOTAL, CKMB, CKMBINDEX, TROPONINI in the last 168 hours. ? ?BNP (last 3 results) ?No results for input(s): PROBNP in the last 8760 hours. ? ?Lipid Profile: ?No results for input(s): CHOL, HDL, LDLCALC, TRIG, CHOLHDL, LDLDIRECT in the last 72 hours. ? ?Thyroid Function Tests: ?No results for input(s): TSH, T4TOTAL, FREET4, T3FREE, THYROIDAB in the last 72 hours. ? ?Anemia Panel: ?No results for input(s): VITAMINB12, FOLATE, FERRITIN, TIBC, IRON, RETICCTPCT in the last 72 hours. ? ?Urine analysis: ?   ?Component Value Date/Time  ? COLORURINE YELLOW 02/26/2022 1532  ? APPEARANCEUR CLEAR 02/26/2022 1532  ? LABSPEC <1.005 (L) 02/26/2022 1532  ? PHURINE 6.5 02/26/2022 1532  ? GLUCOSEU NEGATIVE 02/26/2022 1532  ? HGBUR NEGATIVE 02/26/2022 1532  ? BILIRUBINUR NEGATIVE 02/26/2022 1532  ? BILIRUBINUR negative 04/06/2021 1532  ? KETONESUR NEGATIVE 02/26/2022 1532  ? PROTEINUR NEGATIVE 02/26/2022 1532  ? UROBILINOGEN 0.2 04/06/2021 1532  ? UROBILINOGEN 0.2 02/05/2013 1459  ? NITRITE NEGATIVE 02/26/2022 1532  ? LEUKOCYTESUR NEGATIVE 02/26/2022 1532  ? ? ?Sepsis Labs: ?Lactic Acid, Venous ?   ?Component Value Date/Time  ? LATICACIDVEN 1.0 02/26/2022 1610  ? ? ?MICROBIOLOGY: ?Recent Results (from the past 240 hour(s))  ?MRSA Next Gen by PCR, Nasal      Status: None  ? Collection Time: 02/27/22 12:53 AM  ? Specimen: Nasal Mucosa; Nasal Swab  ?Result Value Ref Range Status  ? MRSA by PCR Next Gen NOT DETECTED NOT DETECTED Final  ?  Comment: (NOTE) ?The GeneXpert MRSA Assay (FDA approved for NASAL specimens only), ?is one component of a comprehensive MRSA colonization surveillance ?program. It is not intended to diagnose MRSA infection nor to guide ?or monitor treatment for MRSA infections. ?Test performance is not FDA approved in patients less than 2 years ?old. ?Performed at [facility], [place] [address] [place], [place] ?[address] ?  ? ? ?RADIOLOGY STUDIES/RESULTS: ?MR BRAIN WO CONTRAST ? ?Result Date: 02/26/2022 ?CLINICAL DATA:  Encephalopathy EXAM: MRI HEAD WITHOUT CONTRAST TECHNIQUE: Multiplanar, multiecho pulse sequences of the brain and surrounding structures were obtained without intravenous contrast. COMPARISON:  None. FINDINGS: Brain: No acute infarct, mass effect or extra-axial collection. No acute or chronic hemorrhage. There is multifocal hyperintense T2-weighted signal within the white matter. Generalized volume loss without a clear lobar predilection. The midline structures are normal. Vascular: Major flow voids are preserved. Skull and  upper cervical spine: Normal calvarium and skull base. Visualized upper cervical spine and soft tissues are normal. Sinuses/Orbits:No paranasal sinus fluid levels or advanced mucosal thickening. No mastoid or middle ear effusion. Normal orbits. IMPRESSION: 1. No acute intracranial abnormality. 2. Generalized volume loss and findings of chronic small vessel disease. Electronically Signed   By: Ulyses Jarred M.D.   On: 02/26/2022 20:29  ? ?DG Chest Port 1 View ? ?Result Date: 02/26/2022 ?CLINICAL DATA:  Altered. EXAM: PORTABLE CHEST 1 VIEW COMPARISON:  06/09/2021 FINDINGS: The cardiomediastinal contours are unchanged. Subsegmental retrocardiac scarring. Pulmonary vasculature is normal. No consolidation, pleural  effusion, or pneumothorax. Multiple skin folds project over the right hemithorax. No acute osseous abnormalities are seen. IMPRESSION: No acute chest findings. Subsegmental retrocardiac scarring. Electronic

## 2022-02-27 NOTE — Progress Notes (Signed)
Pt has been extremely confused tonight hx of alzheimer and parkinson. Earlier at shift change Daughter Helene Kelp mentioned that daughter [name] requested that pt not receive ativan or xanax while in hospital for agitation. Pt frequently trying to leave. Stating I need to go home and get my clothes. Pt informed of clothes in the closet. Then pt stated she had to use bathroom. Rn attempted to get pt up to use BR. Pt had unsteady gait. Pt then stated "I didn't have to use bathroom that was my other character." Pt has had purewick inplace with brief. Pt unsteady RN was able to stand pivot pt back to bed. Pt kept repeating "I want to go upstairs." Attempted to reoriented and pt was cooperative at first and redirectable.  Pt began not following direction at approx 2330. Pt is currently not redirectable. Pt is hallucinating and becoming paranoid. Sitter order placed. 2 RN at bedside to prevent pt from injuring herself. Pt offered to get up to bedside commode to use BR. Pt refused stating "No I want to leave." Pt stated she was going to [place]. Daughter [name] contacted due to pt risk for injury due to she is becoming combative attempting to leave hospital and not following direction. [name] verbalized she was agreeable to giving her medication to relax her but it would just make her sleepy tomorrow. Msg sent to oncall provider. Currently sitter at bedside to prevent injury. Floor mats in place.  ?

## 2022-02-27 NOTE — Care Management Obs Status (Signed)
MEDICARE OBSERVATION STATUS NOTIFICATION ? ? ?Patient Details  ?Name: [name] ?MRN: [id number] ?Date of Birth: November 07, 1936 ? ? ?Medicare Observation Status Notification Given:  Yes ? ? ? ?Carles Collet, RN ?02/27/2022, 3:19 PM ?

## 2022-02-27 NOTE — Progress Notes (Signed)
Subjective: ?No further events ? ?Exam: ?Vitals:  ? 02/27/22 0738 02/27/22 1210  ?BP: (!) 144/67 134/74  ?Pulse: 60 73  ?Resp: 16 16  ?Temp: 97.9 ?F (36.6 ?C) 98.1 ?F (36.7 ?C)  ?SpO2: 94% 94%  ? ?Gen: In bed, NAD ?Resp: non-labored breathing, no acute distress ? ?Neuro: ?MS: Awake, alert, oriented to month after initially seeing February corrects her self to March, oriented to year ?CN: EOMI ?Motor: Moves all extremities well, increased tone and some parkinsonian tremor ?Sensory: Intact light touch ? ? ?Pertinent Labs: ?EEG is negative ? ?Impression: 86 year old female with a history of Parkinson's who had an episode of unresponsiveness of unclear etiology.  She was bradycardic on presentation, and was confused for quite a while after arrival.  Given that the etiology is unclear, and there is no evidence of epileptogenicity on EEG, I would not favor starting antiepileptics at this time.  If she continues to have unexplained episodes of unresponsiveness, could consider antiepileptic trial. ? ?Recommendations: ?1) no AEDs at this time ?2) no further evaluation from a neurological standpoint unless further episodes occur. ?3) cardiac evaluation per cardiology and IM ?4) please call with further questions or concerns. ? ?Roland Rack, MD ?[facility] ?[phone] ? ?If 7pm- 7am, please page neurology on call as listed in [place]. ? ?

## 2022-02-27 NOTE — Progress Notes (Signed)
?  Echocardiogram ?2D Echocardiogram has been performed. ? ?[name] ?02/27/2022, 1:41 PM ?

## 2022-02-27 NOTE — Consult Note (Addendum)
?Cardiology Consultation:  ? ?Patient ID: [name] ?MRN: [id number]; DOB: Oct 29, 1936 ? ?Admit date: 02/26/2022 ?Date of Consult: 02/27/2022 ? ?PCP:  [name], MD ?  ?[facility] Providers ?Cardiologist:  Kirk Ruths, MD new}   ? ? ?Patient Profile:  ? ?[name] is a 86 y.o. female with a hx of parkinson's disease, alzheimer's disease, HTN, HLD, and IBS who is being seen 02/27/2022 for the evaluation of bradycardia at the request of Dr. Sloan Leiter. ? ?History of Present Illness:  ? ?[name] has no prior cardiac history and is not currently following with a cardiologist. ? ?She was BIB EMS for AMS. Pt resides at ALF and went to dinner at her daughter's house the evening before admission. She returned home without incident. She was seen walking around a 1pm, but when her daughter came to check on her at 2pm 02/26/22 she was unresponsive. Reportedly was breathing and had pulses. HR was 44 with BP 190/90 and CBG 110. Neurology consulted for urgent EEG which showed no seizure activity. Brain MRI negative for stroke. She was admitted to medicine service and cardiology asked to consult for bradycardia.  ? ?HR 55 on EKG 05/2021 ? ?Fortunately I was able to speak with her daughter [name] who found her prior to arrival. She states the patient appeared to be in a deep sleep but she had trouble waking her up. She had eye fluttering and was mumbling words. She became more awake and responsive on the way to the ER with EMS. She confirms no cardiac history and she has not had recent syncope. She does have a long history of intermittent dizziness, but she has not recently complained of dizziness. She has severe anxiety and depression but has had no recent changes in medications.  ? ?We discussed her hypertension. Apparently in the past, keeping a BP log caused the patient a tremendous amount of distress. We also discussed possible heart monitor, but her daughter does not think she will keep it on.  ? ?As I was  leaving her room, the RN was finishing orthostatic vitals - I appreciate ~20 point drop between lying and standing, but no significant change between lying and standing for 3 minutes. ? ? ?Past Medical History:  ?Diagnosis Date  ? Allergic rhinitis 02/07/2008  ? Contact dermatitis 03/16/2013  ? Generalized anxiety disorder 02/19/2013  ? has seen [name], [name], and now [name] for counseling  ? Hemorrhoids, external   ? Hot flashes   ? Hyperlipemia 02/07/2008  ? Long standing problem. Pretreatment LDL 175 in 2006, 210 in 2010.  Had tried lipitor but question of rash after 3 days of use. Switched to zocor but reports having leg pain. Retried Lipitor - weakness.  Currently on no medications   ? Hypertension   ? Irritable bowel syndrome   ? Major depressive disorder 03/18/2016  ? Major neurocognitive disorder 01/27/2019  ? possible Alzheimer's disease or mixed presentation  ? Menopausal symptom 11/18/2019  ? Palpitations 02/19/2013  ? Onset of symptoms March '14. EKG with NSR   ? Parkinson's disease   ? concerns given positive DaTScan  ? ? ?Past Surgical History:  ?Procedure Laterality Date  ? benign moles removed    ? CATARACT EXTRACTION W/ INTRAOCULAR LENS IMPLANT Bilateral   ? right - Nov '11, left - Nov '13 [place]  ? HEMORRHOID SURGERY    ? MANDIBLE SURGERY    ? sebaceous cyst excised    ?  ? ?Home  Medications:  ?Prior to Admission medications   ?Medication Sig Start Date End Date Taking? Authorizing Provider  ?CALCIUM CITRATE-VITAMIN D PO Take 1 capsule by mouth daily.   Yes [provider]  ?carbidopa-levodopa (SINEMET IR) 25-100 MG tablet TAKE 1 AND 1/2 TABLETS (37.5-'150MG'$ ) BY MOUTH THREE TIMES A DAY WITH MEALS 02/11/22  Yes Cameron Sprang, MD  ?dorzolamide-timolol (COSOPT) 22.3-6.8 MG/ML ophthalmic solution Place 1 drop into both eyes 3 (three) times daily.   Yes [provider]  ?fluticasone (FLONASE) 50 MCG/ACT nasal spray Place 1 spray into both nostrils daily. Up to 3  times daily as needed   Yes [provider]  ?Multiple Vitamin (MULTIVITAMIN) capsule Take 1 capsule by mouth daily.   Yes [provider]  ?psyllium (METAMUCIL SMOOTH TEXTURE) 58.6 % powder Take 1 packet by mouth daily. 08/03/20  Yes Koberlein, Steele Berg, MD  ?rivastigmine (EXELON) 3 MG capsule Take 1 capsule (3 mg total) by mouth 2 (two) times daily. 02/11/22  Yes Cameron Sprang, MD  ?sertraline (ZOLOFT) 50 MG tablet Take 1 tablet (50 mg total) by mouth daily. ?Patient taking differently: Take 50 mg by mouth at bedtime. 01/11/22  Yes Thayer Headings, PMHNP  ?buPROPion ER [facility] SR) 100 MG 12 hr tablet Take 1 tablet (100 mg total) by mouth every morning. 02/16/22   Thayer Headings, [place]  ? ? ?Inpatient Medications: ?Scheduled Meds: ? carbidopa-levodopa  1.5 tablet Oral TID WC  ? heparin  5,000 Units Subcutaneous Q8H  ? ?Continuous Infusions: ? ?PRN Meds: ?acetaminophen **OR** acetaminophen, ondansetron **OR** ondansetron (ZOFRAN) IV ? ?Allergies:    ?Allergies  ?Allergen Reactions  ? Aricept [Donepezil Hcl]   ?  Hallucinations, confusion  ? Cephalexin   ?  REACTION: diaphoretic and drop in Blood pressure  ? Lorazepam Other (See Comments)  ?  amnesia  ? Melatonin   ?  Weird dreams  ? ? ?Social History:   ?Social History  ? ?Socioeconomic History  ? Marital status: Widowed  ?  Spouse name: Not on file  ? Number of children: 4  ? Years of education: 72  ? Highest education level: Bachelor's degree (e.g., BA, AB, BS)  ?Occupational History  ? Occupation: Retired  ?  Comment: Freight forwarder at [employer]  ?Tobacco Use  ? Smoking status: Former  ?  Packs/day: 0.10  ?  Years: 1.00  ?  Pack years: 0.10  ?  Types: Cigarettes  ?  Quit date: 12/12/1962  ?  Years since quitting: 59.2  ? Smokeless tobacco: Never  ? Tobacco comments:  ?  smoked for 3 months in college   ?Vaping Use  ? Vaping Use: Never used  ?Substance and Sexual Activity  ? Alcohol use: Not Currently  ?  Comment: rarely  ? Drug use: No  ?  Sexual activity: Not Currently  ?  Partners: Male  ?Other Topics Concern  ? Not on file  ?Social History Narrative  ? College grad. Married '61. 4 daughters, 2 grandchildren. 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  ? Advanced Directives: Has Living Will, HCPOA: Malin Sambrano [phone]  ?   ?   ? [place]   ?   ? 01/22/19: Pt. Lives in multi-story home, daughters check in on patient, managing medications  ? Pt. Still drives, manages yardwork she says  ? Enjoys swim aerobics at the [facility]  ? Right handed  ? ?Social Determinants of Health  ? ?Financial Resource Strain: Low Risk   ? Difficulty  of Paying Living Expenses: Not hard at all  ?Food Insecurity: No Food Insecurity  ? Worried About Charity fundraiser in the Last Year: Never true  ? Ran Out of Food in the Last Year: Never true  ?Transportation Needs: No Transportation Needs  ? Lack of Transportation (Medical): No  ? Lack of Transportation (Non-Medical): No  ?Physical Activity: Insufficiently Active  ? Days of Exercise per Week: 3 days  ? Minutes of Exercise per Session: 30 min  ?Stress: No Stress Concern Present  ? Feeling of Stress : Not at all  ?Social Connections: Socially Isolated  ? Frequency of Communication with Friends and Family: More than three times a week  ? Frequency of Social Gatherings with Friends and Family: More than three times a week  ? Attends Religious Services: Never  ? Active Member of Clubs or Organizations: No  ? Attends Archivist Meetings: Never  ? Marital Status: Widowed  ?Intimate Partner Violence: Not At Risk  ? Fear of Current or Ex-Partner: No  ? Emotionally Abused: No  ? Physically Abused: No  ? Sexually Abused: No  ?  ?Family History:   ? ?Family History  ?Problem Relation Age of Onset  ? Dementia Mother   ? Depression Mother 94  ? Heart disease Father   ? Cancer Sister   ?     breast  ? Heart disease Other   ? Heart disease Other   ? Heart disease Other   ? Cancer Sister   ?     breast  ? Hypothyroidism  Sister   ? Asthma Sister   ? Rheum arthritis Paternal Grandmother   ? Rheum arthritis Daughter   ?  ? ?ROS:  ?Please see the history of present illness.  ? ?All other ROS reviewed and negative.    ? ?Physical

## 2022-02-28 ENCOUNTER — Encounter: Payer: Self-pay | Admitting: Neurology

## 2022-02-28 DIAGNOSIS — R4182 Altered mental status, unspecified: Secondary | ICD-10-CM | POA: Diagnosis not present

## 2022-02-28 MED ORDER — QUETIAPINE FUMARATE 25 MG PO TABS
25.0000 mg | ORAL_TABLET | Freq: Once | ORAL | Status: AC | PRN
  Administered 2022-02-28: 25 mg via ORAL
  Filled 2022-02-28: qty 1

## 2022-02-28 NOTE — Progress Notes (Signed)
PT Cancellation Note ? ?Patient Details ?Name: [name] ?MRN: [id number] ?DOB: 10-19-1936 ? ? ?Cancelled Treatment:    Reason Eval/Treat Not Completed: Fatigue/lethargy limiting ability to participate ? ?Patient unable to stay awake for assessment ? ? ?Arby Barrette, PT ?Acute Rehabilitation Services  ?Pager [phone] ?Office [phone] ? ? ?Jeanie Cooks Leilan Bochenek ?02/28/2022, 9:19 AM ?

## 2022-02-28 NOTE — Progress Notes (Signed)
PT Cancellation Note ? ?Patient Details ?Name: [name] ?MRN: [id number] ?DOB: 03/24/1936 ? ? ?Cancelled Treatment:    Reason Eval/Treat Not Completed: Fatigue/lethargy limiting ability to participate ? ?3rd attempt at PT evaluation with pt remaining too lethargic for assessment. Will continue to attempt as MD hopeful pt can return to ALF later today.  ? ? ?Arby Barrette, PT ?Acute Rehabilitation Services  ?Pager [phone] ?Office [phone] ? ?[name] ?02/28/2022, 12:18 PM ?

## 2022-02-28 NOTE — Progress Notes (Signed)
?      ?                 PROGRESS NOTE ? ?      ?PATIENT DETAILS ?Name: [name] ?Age: 86 y.o. ?Sex: female ?Date of Birth: Jan 19, 1936 ?Admit Date: 02/26/2022 ?Admitting Physician Jonetta Osgood, MD ?[id number], Steele Berg, MD ? ?Brief Summary: ?Patient is a 86 y.o.  female with history of Parkinson disease, dementia-who presented with a episode of unresponsiveness that lasted approximately 20 minutes.  She was subsequently admitted to the hospitalist service for further evaluation and treatment. ? ? ?Significant events: ?3/18>> brought to ED by EMS after daughter found patient at assisted living facility [facility]) unresponsive.  Bradycardic to the 40s. ? ?Significant studies: ?3/18>> MRI brain: No acute CVA ?3/18>> CXR: No PNA ?3/18>> Spot EEG: No seizures ? ?Significant microbiology data: ? ? ?Procedures: ? ? ?Consults: ?Neurology, cardiology ? ?Subjective: ?Got Seroquel around 2:30 AM-she is still lethargic. ? ?Objective: ?Vitals: ?Blood pressure 96/83, pulse 62, temperature 97.9 ?F (36.6 ?C), temperature source Axillary, resp. rate 14, height '5\' 3"'$  (1.6 m), weight 54.1 kg, SpO2 94 %.  ? ?Exam: ?Gen Exam: Sleepy-slightly lethargic but arousable-still pretty confused. ?HEENT:atraumatic, normocephalic ?Chest: B/L clear to auscultation anteriorly ?CVS:S1S2 regular ?Abdomen:soft non tender, non distended ?Extremities:no edema ?Neurology: Non focal ?Skin: no rash   ? ?Pertinent Labs/Radiology: ?CBC Latest Ref Rng & Units 02/26/2022 12/22/2021 06/09/2021  ?WBC 4.0 - 10.5 K/uL 4.9 6.2 4.6  ?Hemoglobin 12.0 - 15.0 g/dL 13.6 12.7 12.4  ?Hematocrit 36.0 - 46.0 % 41.5 37.9 37.0  ?Platelets 150 - 400 K/uL 227 214.0 195  ?  ?Lab Results  ?Component Value Date  ? NA 139 02/26/2022  ? K 3.9 02/26/2022  ? CL 105 02/26/2022  ? CO2 25 02/26/2022  ?  ? ? ?Assessment/Plan: ?Unresponsiveness/?  Syncope: Unclear etiology-suspect probably related to autonomic dysfunction in the setting of Parkinson's disease.  She was  orthostatic after 3 minutes of standing on 3/19.  No major telemetry events overnight.  MRI brain without acute abnormalities, EEG without seizures.  Echo with stable EF.  Appreciate cardiology/neurology input. ? ?Acute toxic encephalopathy:  Unfortunately she had sundowning last night and received Seroquel-she is still somewhat somnolent-and unable to ambulate or participate in PT.  We will allow Seroquel to wear off-need to be careful and avoid sedatives at this point.  We will talk with nursing staff to see if we can get a bedside sitter. ? ?Sinus bradycardia: Probably a sequelae of autonomic dysfunction in the setting of Parkinson's disease.  However is on rivastigmine that could cause bradycardia as well.  TSH within normal limits.  Heart rate stable in the 60s-70s this morning. ? ?Parkinson's disease: On Sinemet ? ?Dementia: Supportive care-expect delirium while hospitalized.  Hold rivastigmine-continue Wellbutrin/Zoloft ? ?BMI: ?Estimated body mass index is 21.12 kg/m? as calculated from the following: ?  Height as of this encounter: '5\' 3"'$  (1.6 m). ?  Weight as of this encounter: 54.1 kg.  ? ?Code status: ?  Code Status: DNR  ? ?DVT Prophylaxis: ?heparin injection 5,000 Units Start: 02/27/22 0600 ?SCDs Start: 02/26/22 2307 ? ?Family Communication: Daughter at bedside ? ? ?Disposition Plan: ?Status is: Observation ?The patient remains OBS appropriate and will d/c before 2 midnights.  Await echo-formal cardiology evaluation-PT evaluation-to determine safe disposition before consideration of discharge. ?  ?Planned Discharge Destination: Independent living with home health services ? ? ?Diet: ?Diet Order   ? ?       ?  Diet regular Room service appropriate? Yes; Fluid consistency: Thin  Diet effective now       ?  ? ?  ?  ? ?  ?  ? ? ?Antimicrobial agents: ?Anti-infectives (From admission, onward)  ? ? None  ? ?  ? ? ? ?MEDICATIONS: ?Scheduled Meds: ? buPROPion ER  100 mg Oral q morning  ? carbidopa-levodopa  1.5  tablet Oral TID WC  ? dorzolamide-timolol  1 drop Both Eyes TID  ? heparin  5,000 Units Subcutaneous Q8H  ? multivitamin with minerals  1 tablet Oral Daily  ? psyllium  1 packet Oral Daily  ? sertraline  50 mg Oral QHS  ? ?Continuous Infusions: ?PRN Meds:.acetaminophen **OR** acetaminophen, fluticasone, ondansetron **OR** ondansetron (ZOFRAN) IV ? ? ?I have personally reviewed following labs and imaging studies ? ?LABORATORY DATA: ?CBC: ?Recent Labs  ?Lab 02/26/22 ?1532  ?WBC 4.9  ?NEUTROABS 2.7  ?HGB 13.6  ?HCT 41.5  ?MCV 95.0  ?PLT 227  ? ? ? ?Basic Metabolic Panel: ?Recent Labs  ?Lab 02/26/22 ?1532  ?NA 139  ?K 3.9  ?CL 105  ?CO2 25  ?GLUCOSE 91  ?BUN 15  ?CREATININE 0.92  ?CALCIUM 9.0  ? ? ? ?GFR: ?Estimated Creatinine Clearance: 37.7 mL/min (by C-G formula based on SCr of 0.92 mg/dL). ? ?Liver Function Tests: ?Recent Labs  ?Lab 02/26/22 ?1532  ?AST 18  ?ALT 7  ?ALKPHOS 46  ?BILITOT 1.2  ?PROT 6.3*  ?ALBUMIN 3.9  ? ? ?No results for input(s): LIPASE, AMYLASE in the last 168 hours. ?Recent Labs  ?Lab 02/26/22 ?1610  ?AMMONIA 14  ? ? ? ?Coagulation Profile: ?No results for input(s): INR, PROTIME in the last 168 hours. ? ?Cardiac Enzymes: ?No results for input(s): CKTOTAL, CKMB, CKMBINDEX, TROPONINI in the last 168 hours. ? ?BNP (last 3 results) ?No results for input(s): PROBNP in the last 8760 hours. ? ?Lipid Profile: ?No results for input(s): CHOL, HDL, LDLCALC, TRIG, CHOLHDL, LDLDIRECT in the last 72 hours. ? ?Thyroid Function Tests: ?No results for input(s): TSH, T4TOTAL, FREET4, T3FREE, THYROIDAB in the last 72 hours. ? ?Anemia Panel: ?No results for input(s): VITAMINB12, FOLATE, FERRITIN, TIBC, IRON, RETICCTPCT in the last 72 hours. ? ?Urine analysis: ?   ?Component Value Date/Time  ? COLORURINE YELLOW 02/26/2022 1532  ? APPEARANCEUR CLEAR 02/26/2022 1532  ? LABSPEC <1.005 (L) 02/26/2022 1532  ? PHURINE 6.5 02/26/2022 1532  ? GLUCOSEU NEGATIVE 02/26/2022 1532  ? HGBUR NEGATIVE 02/26/2022 1532  ? BILIRUBINUR  NEGATIVE 02/26/2022 1532  ? BILIRUBINUR negative 04/06/2021 1532  ? KETONESUR NEGATIVE 02/26/2022 1532  ? PROTEINUR NEGATIVE 02/26/2022 1532  ? UROBILINOGEN 0.2 04/06/2021 1532  ? UROBILINOGEN 0.2 02/05/2013 1459  ? NITRITE NEGATIVE 02/26/2022 1532  ? LEUKOCYTESUR NEGATIVE 02/26/2022 1532  ? ? ?Sepsis Labs: ?Lactic Acid, Venous ?   ?Component Value Date/Time  ? LATICACIDVEN 1.0 02/26/2022 1610  ? ? ?MICROBIOLOGY: ?Recent Results (from the past 240 hour(s))  ?MRSA Next Gen by PCR, Nasal     Status: None  ? Collection Time: 02/27/22 12:53 AM  ? Specimen: Nasal Mucosa; Nasal Swab  ?Result Value Ref Range Status  ? MRSA by PCR Next Gen NOT DETECTED NOT DETECTED Final  ?  Comment: (NOTE) ?The GeneXpert MRSA Assay (FDA approved for NASAL specimens only), ?is one component of a comprehensive MRSA colonization surveillance ?program. It is not intended to diagnose MRSA infection nor to guide ?or monitor treatment for MRSA infections. ?Test performance is not FDA approved in patients less than  2 years ?old. ?Performed at [facility], [place] [address], [place], [place] ?[address] ?  ? ? ?RADIOLOGY STUDIES/RESULTS: ?MR BRAIN WO CONTRAST ? ?Result Date: 02/26/2022 ?CLINICAL DATA:  Encephalopathy EXAM: MRI HEAD WITHOUT CONTRAST TECHNIQUE: Multiplanar, multiecho pulse sequences of the brain and surrounding structures were obtained without intravenous contrast. COMPARISON:  None. FINDINGS: Brain: No acute infarct, mass effect or extra-axial collection. No acute or chronic hemorrhage. There is multifocal hyperintense T2-weighted signal within the white matter. Generalized volume loss without a clear lobar predilection. The midline structures are normal. Vascular: Major flow voids are preserved. Skull and upper cervical spine: Normal calvarium and skull base. Visualized upper cervical spine and soft tissues are normal. Sinuses/Orbits:No paranasal sinus fluid levels or advanced mucosal thickening. No mastoid or middle ear  effusion. Normal orbits. IMPRESSION: 1. No acute intracranial abnormality. 2. Generalized volume loss and findings of chronic small vessel disease. Electronically Signed   By: Ulyses Jarred M.D.   On: 02/26/2022 20:2

## 2022-02-28 NOTE — Telephone Encounter (Signed)
Spoke with [name], the patient's daughter and informed her of the message below.  ?

## 2022-02-28 NOTE — Progress Notes (Signed)
?  Transition of Care (TOC) Screening Note ? ? ?Patient Details  ?Name: [name] ?Date of Birth: 1936/10/06 ? ? ?Transition of Care (TOC) CM/SW Contact:    ?Benard Halsted, LCSW ?Phone Number: ?02/28/2022, 9:09 AM ? ? ? ?Transition of Care Department [facility]) has reviewed patient and no TOC needs have been identified at this time. We will continue to monitor patient advancement through interdisciplinary progression rounds. If new patient transition needs arise, please place a TOC consult. ? ? ?

## 2022-02-28 NOTE — Plan of Care (Signed)

## 2022-02-28 NOTE — [place] FL2 (Addendum)
?[place] MEDICAID FL2 LEVEL OF CARE SCREENING TOOL  ?  ? ?IDENTIFICATION  ?Patient Name: ?[name] Birthdate: 11-03-1936 Sex: female Admission Date (Current Location): ?02/26/2022  ?[place] Number: ? [place] ?  Facility and Address:  ?[facility] [place], [address] [place], [place] [address] ?     Provider Number: ?[id number]  ?Attending Physician Name and Address:  ?Maretta Bees, MD ? Relative Name and Phone Number:  ?  ?   ?Current Level of Care: ?Hospital Recommended Level of Care: ?Skilled Nursing Facility Prior Approval Number: ?  ? ?Date Approved/Denied: ?  PASRR Number: ?[id number] H ? ?Discharge Plan: ?SNF ?  ? ?Current Diagnoses: ?Patient Active Problem List  ? Diagnosis Date Noted  ? Syncope 02/27/2022  ? DNR (do not resuscitate)/DNI(Do Not Intubate) 02/26/2022  ? Altered mental status 02/26/2022  ? Parkinson's disease (HCC) 02/26/2022  ? Alzheimer's dementia (HCC) 02/26/2022  ? Sinus bradycardia 02/26/2022  ? Menopausal symptom 11/18/2019  ? Major neurocognitive disorder 01/27/2019  ? Major depressive disorder 03/18/2016  ? Hot flashes 04/03/2014  ? Generalized anxiety disorder 02/19/2013  ? Hypertension 05/24/2011  ? Hyperlipemia 02/07/2008  ? Allergic rhinitis 02/07/2008  ? ? ?Orientation RESPIRATION BLADDER Height & Weight   ?  ?Self ? Normal Incontinent, External catheter Weight: 119 lb 3.2 oz (54.1 kg) ?Height:  5\' 3"  (160 cm)  ?BEHAVIORAL SYMPTOMS/MOOD NEUROLOGICAL BOWEL NUTRITION STATUS  ?    Continent Diet (See dc summary)  ?AMBULATORY STATUS COMMUNICATION OF NEEDS Skin   ?Extensive Assist Verbally Normal ?  ?  ?  ?    ?     ?     ? ? ?Personal Care Assistance Level of Assistance  ?Bathing, Feeding, Dressing Bathing Assistance: Maximum assistance ?Feeding assistance: Independent ?Dressing Assistance: Limited assistance ?   ? ?Functional Limitations Info  ?    ?  ?   ? ? ?SPECIAL CARE FACTORS FREQUENCY  ?PT (By licensed PT), OT (By licensed OT)   ?  ?PT  Frequency: 5x/week ?OT Frequency: 5x/week ?  ?  ?  ?   ? ? ?Contractures Contractures Info: Not present  ? ? ?Additional Factors Info  ?Code Status, Allergies, Psychotropic Code Status Info: DNR ?Allergies Info: Aricept (Donepezil Hcl), Cephalexin, Lorazepam, Melatonin ?Psychotropic Info: Wellbutrin; Zoloft ?  ?  ?   ? ?Current Medications (02/28/2022):  This is the current hospital active medication list ?Current Facility-Administered Medications  ?Medication Dose Route Frequency Provider Last Rate Last Admin  ? acetaminophen (TYLENOL) tablet 650 mg  650 mg Oral Q6H PRN Carollee Herter, DO   650 mg at 02/27/22 2040  ? Or  ? acetaminophen (TYLENOL) suppository 650 mg  650 mg Rectal Q6H PRN Carollee Herter, DO      ? buPROPion ER [facility] SR) 12 hr tablet 100 mg  100 mg Oral q morning Maretta Bees, MD   100 mg at 02/28/22 0830  ? carbidopa-levodopa (SINEMET IR) 25-100 MG per tablet immediate release 1.5 tablet  1.5 tablet Oral TID WC Carollee Herter, DO   1.5 tablet at 02/28/22 1211  ? dorzolamide-timolol (COSOPT) 22.3-6.8 MG/ML ophthalmic solution 1 drop  1 drop Both Eyes TID Maretta Bees, MD   1 drop at 02/28/22 0831  ? fluticasone (FLONASE) 50 MCG/ACT nasal spray 1 spray  1 spray Each Nare TID PRN Maretta Bees, MD      ? heparin injection 5,000 Units  5,000 Units Subcutaneous Q8H Carollee Herter, DO  5,000 Units at 02/28/22 1211  ? multivitamin with minerals tablet 1 tablet  1 tablet Oral Daily Ghimire, Werner Lean, MD      ? ondansetron [facility]) tablet 4 mg  4 mg Oral Q6H PRN Carollee Herter, DO      ? Or  ? ondansetron (ZOFRAN) injection 4 mg  4 mg Intravenous Q6H PRN Carollee Herter, DO      ? psyllium (HYDROCIL/METAMUCIL) 1 packet  1 packet Oral Daily Maretta Bees, MD   1 packet at 02/28/22 [phone]  ? sertraline (ZOLOFT) tablet 50 mg  50 mg Oral QHS Maretta Bees, MD   50 mg at 02/27/22 2040  ? ? ? ?Discharge Medications: ?Please see discharge summary for a list of discharge medications. ? ?Relevant Imaging  Results: ? ?Relevant Lab Results: ? ? ?Additional Information ?SSN: [id number]. Pfizer COVID-19 Vaccine 09/01/2021 , 01/26/2020 , 01/04/2020 ? ?Renne Crigler Glendon Fiser, LCSW ? ? ? ? ?

## 2022-02-28 NOTE — Evaluation (Signed)
Physical Therapy Evaluation ?Patient Details ?Name: [name] ?MRN: [id number] ?DOB: 01-13-1936 ?Today's Date: 02/28/2022 ? ?History of Present Illness ? 86 year old female presents to the ER 02/26/22 with period of unresponsiveness. MRI brain was negative for stroke. EEG was negative for active seizures. +bradycardia; +orthostasis  PMH Parkinson's disease, Alzheimer type dementia, hyperlipidemia, HTN, and IBS  ?Clinical Impression ?  ?Pt admitted secondary to problem above with deficits below. PTA patient was living in ALF and mobilizing independently without use of an assistive device. She was on the wait-list for the memory care portion of the ALF (per daughter).  Pt currently requires max assist for bed mobility, sitting balance, and standing balance (due to posterior bias). Patient unable to progress to ambulation as she could not advance either foot. She was orthostatic in standing (supine BP 107/62; after standing 85/53--BP would not register while she was standing). Although it would be ideal from a cognitive perspective to return pt to her ALF, she is not functionally able to perform at a safe level for ALF. Anticipate patient will benefit from PT to address problems listed below.Will continue to follow acutely to maximize functional mobility independence and safety.   ?   ?   ? ?Recommendations for follow up therapy are one component of a multi-disciplinary discharge planning process, led by the attending physician.  Recommendations may be updated based on patient status, additional functional criteria and insurance authorization. ? ?Follow Up Recommendations Skilled nursing-short term rehab (<3 hours/day) ? ?  ?Assistance Recommended at Discharge Frequent or constant Supervision/Assistance  ?Patient can return home with the following ? Two people to help with walking and/or transfers;Assistance with cooking/housework;Assistance with feeding;Direct supervision/assist for medications management;Direct  supervision/assist for financial management;Assist for transportation ? ?  ?Equipment Recommendations None recommended by PT  ?Recommendations for Other Services ? OT consult  ?  ?Functional Status Assessment Patient has had a recent decline in their functional status and demonstrates the ability to make significant improvements in function in a reasonable and predictable amount of time.  ? ?  ?Precautions / Restrictions Precautions ?Precautions: Fall ?Restrictions ?Weight Bearing Restrictions: No  ? ?  ? ?Mobility ? Bed Mobility ?Overal bed mobility: Needs Assistance ?Bed Mobility: Supine to Sit, Sit to Sidelying, Rolling ?Rolling: Mod assist ?  ?Supine to sit: Mod assist ?  ?Sit to sidelying: Max assist ?General bed mobility comments: pt attempting to sit straight up from supine and required mod assist to raise torso and scoot out to EOB; returned to sidelying with max assist to lower torso and raise legs; rolled side to back with mod assist ?  ? ?Transfers ?Overall transfer level: Needs assistance ?Equipment used: 1 person hand held assist ?Transfers: Sit to/from Stand ?Sit to Stand: Max assist ?  ?  ?  ?  ?  ?General transfer comment: strong posterior bias with pt bracing legs against bed frame and still requires max assist to come up; repeated x 2 with no improvement ?  ? ?Ambulation/Gait ?  ?  ?  ?  ?  ?  ?Pre-gait activities: attempted gait with pt unable to advance her feet; even with assist to laterally weight shift, she could not advance either foot ("glued to the floor" despite wearing her shoes ?  ? ?Stairs ?  ?  ?  ?  ?  ? ?Wheelchair Mobility ?  ? ?Modified Rankin (Stroke Patients Only) ?  ? ?  ? ?Balance Overall balance assessment: Needs assistance ?Sitting-balance support: No upper extremity supported, Feet  supported ?Sitting balance-Leahy Scale: Zero ?Sitting balance - Comments: posterior lean and falls backwards without righting reaction if support withdrawn ?  ?Standing balance support: Single  extremity supported ?Standing balance-Leahy Scale: Zero ?Standing balance comment: strong posterior lean that worsens if try to advance her body forward over her BOS ?  ?  ?  ?  ?  ?  ?  ?  ?  ?  ?  ?   ? ? ? ?Pertinent Vitals/Pain Pain Assessment ?Pain Assessment: Faces ?Faces Pain Scale: No hurt  ? ? ?Home Living Family/patient expects to be discharged to:: Assisted living ?  ?  ?  ?  ?  ?  ?  ?  ?Home Equipment: Conservation officer, nature (2 wheels);Cane - single point ?   ?  ?Prior Function Prior Level of Function : Needs assist ?  ?  ?  ?  ?  ?  ?Mobility Comments: walked independently without a device ?ADLs Comments: independent with ADLs per daughter; assist with IADLs (ALF staff) ?  ? ? ?Hand Dominance  ?   ? ?  ?Extremity/Trunk Assessment  ? Upper Extremity Assessment ?Upper Extremity Assessment: Generalized weakness ?  ? ?Lower Extremity Assessment ?Lower Extremity Assessment: Generalized weakness ?  ? ?   ?Communication  ? Communication: No difficulties  ?Cognition Arousal/Alertness: Awake/alert ?Behavior During Therapy: Flat affect ?Overall Cognitive Status: Impaired/Different from baseline ?Area of Impairment: Orientation, Attention, Memory, Following commands, Awareness, Problem solving ?  ?  ?  ?  ?  ?  ?  ?  ?Orientation Level: Place, Time, Situation ?Current Attention Level: Sustained ?Memory: Decreased short-term memory ?Following Commands: Follows one step commands inconsistently, Follows one step commands with increased time ?  ?Awareness: Intellectual ?Problem Solving: Slow processing, Decreased initiation, Difficulty sequencing, Requires verbal cues, Requires tactile cues ?General Comments: pt reaching for socks, and without picking one up goes through the motions of putting on a sock; picking at a "ribbon" that she needs to remove (no ribbon present) ?  ?  ? ?  ?General Comments General comments (skin integrity, edema, etc.): Daughter present during morning attempts and provided information re: prior  status. Daughter not present during pm session ? ?  ?Exercises    ? ?Assessment/Plan  ?  ?PT Assessment Patient needs continued PT services  ?PT Problem List Decreased strength;Decreased activity tolerance;Decreased balance;Decreased mobility;Decreased coordination;Decreased cognition;Decreased knowledge of use of DME;Decreased safety awareness;Decreased knowledge of precautions ? ?   ?  ?PT Treatment Interventions DME instruction;Gait training;Functional mobility training;Therapeutic activities;Therapeutic exercise;Balance training;Neuromuscular re-education;Cognitive remediation;Patient/family education   ? ?PT Goals (Current goals can be found in the Care Plan section)  ?Acute Rehab PT Goals ?Patient Stated Goal: unable to state ?PT Goal Formulation: Patient unable to participate in goal setting ?Time For Goal Achievement: 03/14/22 ?Potential to Achieve Goals: Fair ? ?  ?Frequency Min 3X/week ?  ? ? ?Co-evaluation   ?  ?  ?  ?  ? ? ?  ?AM-PAC PT "6 Clicks" Mobility  ?Outcome Measure Help needed turning from your back to your side while in a flat bed without using bedrails?: A Lot ?Help needed moving from lying on your back to sitting on the side of a flat bed without using bedrails?: A Lot ?Help needed moving to and from a bed to a chair (including a wheelchair)?: Total ?Help needed standing up from a chair using your arms (e.g., wheelchair or bedside chair)?: Total ?Help needed to walk in hospital room?: Total ?Help needed climbing 3-5 steps with  a railing? : Total ?6 Click Score: 8 ? ?  ?End of Session Equipment Utilized During Treatment: Gait belt ?Activity Tolerance: Treatment limited secondary to medical complications (Comment) (orthostasis) ?Patient left: in bed;with call bell/phone within reach;with bed alarm set ?Nurse Communication: Mobility status ?PT Visit Diagnosis: Other abnormalities of gait and mobility (R26.89);Muscle weakness (generalized) (M62.81) ?  ? ?Time: 1421-1500 ?PT Time Calculation (min)  (ACUTE ONLY): 39 min ? ? ?Charges:   PT Evaluation ?$PT Eval High Complexity: 1 High ?PT Treatments ?$Therapeutic Activity: 23-37 mins ?  ?   ? ? ? ?Arby Barrette, PT ?Acute Rehabilitation Services  ?Pager [phone]

## 2022-02-28 NOTE — Telephone Encounter (Signed)
See prior phone note. 

## 2022-02-28 NOTE — TOC Initial Note (Signed)
Transition of Care (TOC) - Initial/Assessment Note  ? ? ?Patient Details  ?Name: [name] ?MRN: [id number] ?Date of Birth: 29-May-1936 ? ?Transition of Care (TOC) CM/SW Contact:    ?Benard Halsted, LCSW ?Phone Number: ?02/28/2022, 3:18 PM ? ?Clinical Narrative:                 ?CSW received consult for possible SNF placement at time of discharge. CSW spoke with patient's daughter, [name]. She stated that patient resides at [facility] and she has spoken with facility about their "comfort care" plan that would increase support for patient upon discharge. Daughter reports preference for 1-[facility] 2-[facility] if SNF is needed, though she would like to see how patient progresses without the Ativan tomorrow so that patient could have familiar surroundings. CSW discussed insurance authorization process and will provide Medicare SNF ratings list. Patient has received COVID vaccines. CSW will send out referrals for review. Pasrr pending review.  ? ?Skilled Nursing Rehab Facilities-   [email or web address]   Ratings out of 5 possible   ?Name Address  Phone # Quality Care Staffing Health Inspection Overall  ?[facility] [address], [place] '5 5 2 4  '$ ?[place], [place] [phone] '4 2 5 5  '$ ?[place] '4 1 1 1  '$ ?[place], [place] '2 2 4 4  '$ ?[facility] [address], [place] '1 1 2 1  '$ ?[place] '2 1 4 3  '$ ?[place], [place] [phone] '5 2 2 3  '$ ?[facility] [address] [place] '5 2 2 3  '$ ?[address] ([place]) [address], [place] [phone] '5 1 2 2  '$ ?[facility] [address], [place] [phone] '4 1 1 1  '$ ?[facility] [address], [place] [phone] '4 1 2 1   '$ ?[facility] ([place]) [place] [phone] '4 1 1 1  '$ ?        ?[place], [place]      ?[facility] [place] '4 2 3 3  '$ ?[facility] [address], [place] '3 1 5 4  '$ ?[place] [address], [place] [phone] '2 1 1 1  '$ ?[facility] [address] [employer] [phone] '2 2 3 3  '$ ?        ?[address] (no [facility]) [address], [place] [phone] '4 5 5 5  '$ ?[facility] (No Humana) [address], [place] '4 1 4 3  '$ ?[place] (No UHC) [address], [place] [phone] '5 5 5 5  '$ ?[facility] [address] [employer] [phone] '3 2 4 4  '$ ?[facility] [address] [place] [phone] '3 3 4 4  '$ ?[facility] [address], [place] '1 1 2 1  '$ ?[facility] [address], [place] [phone] '2 1 1 1  '$ ?[facility] [place] '5 2 4 5  '$ ?[facility] [address], [place] '3 1 1 1  '$ ?[facility] [place], [place] '2 1 2 1  '$ ?        ?[facility] [address], [place] '1 1 1 1  '$ ?[place] [address], [place]  [phone] '2 4 2 2  '$ ?  [facility] [place] [address], [place] [phone] '5 2 3 4  '$ ?[facility] [address], [place] '2 1 1 1  '$ ?[place] (No Humana) [address] [place] '2 1 3 2  '$ ?[facility] [address], [place] [phone] '3 1 1 1  '$ ?        ?[facility] [place], [place] '5 4 5 5  '$ ?[facility])  [address], [place] '2 2 3 3  '$ ?[facility]) [place], [place] [phone] '3 2 4 4  '$ ?[facility] [address], [place] '4 3 4 4  '$ ?[address] [place], [place] '3 3 1 1  '$ ?[facility]) [address] [phone] '2 2 4 4  '$ ? ? ? ?Expected Discharge Plan: [place] ?Barriers to Discharge: Continued Medical Work up, Environmental education officer) ? ? ?Patient Goals and CMS Choice ?Patient states their goals for this hospitalization and ongoing recovery are:: Rehab ?CMS Medicare.gov Compare Post Acute Care list provided to:: Patient Represenative (must comment) ?Choice offered to / list presented to : Adult Children ? ?Expected Discharge Plan and Services ?Expected Discharge Plan: [place] ?In-house Referral: Clinical Social Work ?  ?  ?Living arrangements for the past 2 months: [place] ?                ?  ?  ?  ?  ?  ?  ?  ?  ?  ?  ? ?Prior Living Arrangements/Services ?Living arrangements for the past 2 months: [place] ?Lives with:: Self ?Patient language and need for interpreter reviewed:: Yes ?Do you feel safe going back to the place where you live?: Yes      ?Need for Family Participation in Patient Care: Yes (Comment) ?Care giver support system in place?: Yes (comment) ?  ?Criminal Activity/Legal Involvement Pertinent to Current Situation/Hospitalization: No - Comment as needed ? ?Activities of Daily Living ?Home Assistive Devices/Equipment: [place] R[place] (specify type) ?ADL Screening (condition at time of admission) ?Patient's cognitive ability adequate to safely complete daily activities?: Yes ?Is the patient deaf or have difficulty hearing?: No ?Does the patient have difficulty seeing, even when wearing glasses/contacts?: No ?Does the patient have difficulty concentrating, remembering, or making decisions?: Yes ?Patient able to express need for assistance with ADLs?: Yes ?Does the patient have difficulty dressing or bathing?: No ?Independently performs ADLs?: Yes (appropriate for developmental age) ?Does the patient have difficulty walking  or climbing stairs?: Yes ?Weakness of Legs: Both ?Weakness of Arms/Hands: None ? ?Permission Sought/Granted ?Permission sought to share information with : Facility Sport and exercise psychologist, Family Supports ?Permission granted to share information with : No ?   ? Permission granted to share info w AGENCY: SNFs ? Permission granted to share info w Relationship: Daughters ?   ? ?Emotional Assessment ?Appearance:: Appears stated age ?Attitude/Demeanor/Rapport: Unable to Assess ?Affect (typically observed): Unable to Assess ?Orientation: : Oriented to Self ?Alcohol / Substance Use: Not Applicable ?Psych Involvement: No (comment) ? ?Admission diagnosis:  Altered mental status [R41.82] ?Syncope [R55] ?Patient Active Problem List  ? Diagnosis Date Noted  ? Syncope 02/27/2022  ? DNR (do not resuscitate)/DNI(Do Not Intubate) 02/26/2022  ? Altered mental status 02/26/2022  ? Parkinson's disease ([place]) 02/26/2022  ? Alzheimer's dementia ([place]) 02/26/2022  ? Sinus bradycardia 02/26/2022  ? Menopausal symptom 11/18/2019  ? Major  neurocognitive disorder 01/27/2019  ? Major depressive disorder 03/18/2016  ? Hot flashes 04/03/2014  ? Generalized anxiety disorder 02/19/2013  ? Hypertension 05/24/2011  ? Hyperlipemia 02/07/2008  ? Allergic rhinitis 02/07/2008  ? ?PCP:  [name], MD ?Pharmacy:   ?[facility] [place] [address] ?[place] [address] ?[address] ?[place] [address] ?Phone: [phone] Fax: [phone] ? ? ? ? ?Social Determinants of Health (SDOH) Interventions ?  ? ?Readmission Risk Interventions ?No flowsheet data found. ? ? ?

## 2022-02-28 NOTE — Telephone Encounter (Signed)
I also left a detailed message on [name]'s voicemail with [place] with the instructions below per PCP. ?

## 2022-02-28 NOTE — Plan of Care (Signed)
Pt alert and oriented x 1. After given Seroquel dose pt less agitation but did not rest. Vitals stable. BP had to be taken multiple times due to agitation and pt would not cooperate with staff and be calm and still for reading. Prn tylenol and Seroquel given during overnight.  ?Problem: Education: ?Goal: Knowledge of General Education information will improve ?Description: Including pain rating scale, medication(s)/side effects and non-pharmacologic comfort measures ?Outcome: Not Progressing ? Pt unable to follow commands and conversation. Inappropriate conversation during overnight ?Problem: Coping: ?Goal: Level of anxiety will decrease ?Outcome: Not Progressing ? Pt had to be given seroquel during overnight due to confusion, agitation and combative behavior spoke with [name] on phone and approved pt receiving something for anxiety.  ?Problem: Health Behavior/Discharge Planning: ?Goal: Ability to manage health-related needs will improve ?Outcome: Progressing ?  ?Problem: Clinical Measurements: ?Goal: Ability to maintain clinical measurements within normal limits will improve ?Outcome: Progressing ?Goal: Will remain free from infection ?Outcome: Progressing ?Goal: Diagnostic test results will improve ?Outcome: Progressing ?Goal: Respiratory complications will improve ?Outcome: Progressing ?Goal: Cardiovascular complication will be avoided ?Outcome: Progressing ?  ?Problem: Activity: ?Goal: Risk for activity intolerance will decrease ?Outcome: Progressing ?  ?Problem: Nutrition: ?Goal: Adequate nutrition will be maintained ?Outcome: Progressing ?  ?Problem: Elimination: ?Goal: Will not experience complications related to bowel motility ?Outcome: Progressing ?Goal: Will not experience complications related to urinary retention ?Outcome: Progressing ?  ?Problem: Pain Managment: ?Goal: General experience of comfort will improve ?Outcome: Progressing ?  ?Problem: Safety: ?Goal: Ability to remain free from injury will  improve ?Outcome: Progressing ?  ?Problem: Skin Integrity: ?Goal: Risk for impaired skin integrity will decrease ?Outcome: Progressing ?  ?

## 2022-02-28 NOTE — Telephone Encounter (Signed)
I was sent to voicemail, tried to contact center again x 2 no answer, will call again  ?

## 2022-02-28 NOTE — Progress Notes (Signed)
Provider Dr. Marlowe Sax called at 0224 regarding amion msg. Seroquel ordered. At this time Seroquel has decreased anxiety and aggressive behavior.  ?

## 2022-03-01 DIAGNOSIS — E569 Vitamin deficiency, unspecified: Secondary | ICD-10-CM | POA: Diagnosis not present

## 2022-03-01 DIAGNOSIS — F329 Major depressive disorder, single episode, unspecified: Secondary | ICD-10-CM | POA: Diagnosis not present

## 2022-03-01 DIAGNOSIS — F0150 Vascular dementia without behavioral disturbance: Secondary | ICD-10-CM | POA: Diagnosis not present

## 2022-03-01 DIAGNOSIS — G4700 Insomnia, unspecified: Secondary | ICD-10-CM | POA: Diagnosis not present

## 2022-03-01 DIAGNOSIS — R451 Restlessness and agitation: Secondary | ICD-10-CM | POA: Diagnosis not present

## 2022-03-01 DIAGNOSIS — R4182 Altered mental status, unspecified: Secondary | ICD-10-CM | POA: Diagnosis not present

## 2022-03-01 DIAGNOSIS — G9340 Encephalopathy, unspecified: Secondary | ICD-10-CM

## 2022-03-01 DIAGNOSIS — R55 Syncope and collapse: Secondary | ICD-10-CM | POA: Diagnosis not present

## 2022-03-01 DIAGNOSIS — G20 Parkinson's disease: Secondary | ICD-10-CM | POA: Diagnosis not present

## 2022-03-01 DIAGNOSIS — F0280 Dementia in other diseases classified elsewhere without behavioral disturbance: Secondary | ICD-10-CM | POA: Diagnosis not present

## 2022-03-01 DIAGNOSIS — H269 Unspecified cataract: Secondary | ICD-10-CM | POA: Diagnosis not present

## 2022-03-01 DIAGNOSIS — E785 Hyperlipidemia, unspecified: Secondary | ICD-10-CM | POA: Diagnosis not present

## 2022-03-01 DIAGNOSIS — R001 Bradycardia, unspecified: Secondary | ICD-10-CM | POA: Diagnosis not present

## 2022-03-01 DIAGNOSIS — I10 Essential (primary) hypertension: Secondary | ICD-10-CM | POA: Diagnosis not present

## 2022-03-01 DIAGNOSIS — F05 Delirium due to known physiological condition: Secondary | ICD-10-CM | POA: Diagnosis not present

## 2022-03-01 DIAGNOSIS — J309 Allergic rhinitis, unspecified: Secondary | ICD-10-CM | POA: Diagnosis not present

## 2022-03-01 NOTE — TOC Progression Note (Addendum)
Transition of Care (TOC) - Progression Note  ? ? ?Patient Details  ?Name: [name] ?MRN: [id number] ?Date of Birth: Mar 13, 1936 ? ?Transition of Care (TOC) CM/SW Contact  ?Benard Halsted, LCSW ?Phone Number: ?03/01/2022, 11:41 AM ? ?Clinical Narrative:    ?11:41am-CSW spoke with patient's daughter, [name]. She requested CSW meet with her sister [name] at bedside. CSW met with [name] and provided bed offer of [facility] semi-private bed for today. She requested CSW follow up with Amber at [facility] to make sure they cannot provide the care there instead of SNF or after SNF. CSW left voicemail for [facility] and spoke with Rollene Fare who will also get in touch with the team to see what is available. CSW updated [facility] that patient is medically stable for discharge.  ? ?1:15pm-CSW spoke with Rollene Fare who stated Amber is unable to provide 24/7 care for patient. CSW spoke with [name] who confirmed decision to go to [facility]. She will transport patient by car.  ? ?Expected Discharge Plan: [place] ?Barriers to Discharge: Barriers Resolved ? ?Expected Discharge Plan and Services ?Expected Discharge Plan: [place] ?In-house Referral: Clinical Social Work ?  ?  ?Living arrangements for the past 2 months: [place] ?                ?  ?  ?  ?  ?  ?  ?  ?  ?  ?  ? ? ?Social Determinants of Health (SDOH) Interventions ?  ? ?Readmission Risk Interventions ?No flowsheet data found. ? ?

## 2022-03-01 NOTE — Progress Notes (Signed)
Physical Therapy Treatment ?Patient Details ?Name: [name] ?MRN: [id number] ?DOB: 03-18-1936 ?Today's Date: 03/01/2022 ? ? ?History of Present Illness 86 year old female presents to the ER 02/26/22 with period of unresponsiveness. MRI brain was negative for stroke. EEG was negative for active seizures. +bradycardia; +orthostasis  PMH Parkinson's disease, Alzheimer type dementia, hyperlipidemia, HTN, and IBS ? ?  ?PT Comments  ? ? Patient continues to be limited in mobility by posterior bias (worse in standing than sitting). Was able to walk 40 ft with mod assist with hand-held assist. After prolonged standing for pericare due to incontinence of bowels, she was too dizzy to attempt another walk. BP not obtained.  ? ?Discussion with MD, daughter, and patient re: discharge planning options with daughter hopeful [facility] can provide (?hire) Designer, multimedia for pt and allow her to return to a familiar environment. Daughter understands if this is not possible, the next best option is SNF.  ?   ?Recommendations for follow up therapy are one component of a multi-disciplinary discharge planning process, led by the attending physician.  Recommendations may be updated based on patient status, additional functional criteria and insurance authorization. ? ?Follow Up Recommendations ? Skilled nursing-short term rehab (<3 hours/day) (unless ALF/Family can provide 24/7 sitter) ?  ?  ?Assistance Recommended at Discharge Frequent or constant Supervision/Assistance  ?Patient can return home with the following Assistance with cooking/housework;Assistance with feeding;Direct supervision/assist for medications management;Direct supervision/assist for financial management;Assist for transportation;A lot of help with walking and/or transfers;A lot of help with bathing/dressing/bathroom ?  ?Equipment Recommendations ? None recommended by PT  ?  ?Recommendations for Other Services   ? ? ?  ?Precautions / Restrictions  Precautions ?Precautions: Fall ?Restrictions ?Weight Bearing Restrictions: No  ?  ? ?Mobility ? Bed Mobility ?  ?  ?  ?  ?  ?  ?  ?General bed mobility comments: sitting EOB with MD and NT on arrival ?  ? ?Transfers ?Overall transfer level: Needs assistance ?Equipment used: 1 person hand held assist ?Transfers: Sit to/from Stand ?Sit to Stand: Max assist ?  ?  ?  ?  ?  ?General transfer comment: strong posterior bias; repeated x 3 with no improvement (from bed and recliner) ?  ? ?Ambulation/Gait ?Ambulation/Gait assistance: Mod assist ?Gait Distance (Feet): 40 Feet ?Assistive device: 1 person hand held assist ?Gait Pattern/deviations: Step-through pattern, Decreased stride length, Shuffle, Narrow base of support ?Gait velocity: decr ?  ?  ?General Gait Details: Very small, shuffling steps typical of PD; step length gradually improved with incr distance, however continued with very narrow BOS/near scissoring ? ? ?Stairs ?  ?  ?  ?  ?  ? ? ?Wheelchair Mobility ?  ? ?Modified Rankin (Stroke Patients Only) ?  ? ? ?  ?Balance Overall balance assessment: Needs assistance ?Sitting-balance support: No upper extremity supported, Feet supported ?Sitting balance-Leahy Scale: Poor ?Sitting balance - Comments: initial support progresseing to close guarding ?  ?Standing balance support: Single extremity supported ?Standing balance-Leahy Scale: Zero ?Standing balance comment: strong posterior lean; stood for 2 minutes, 3 minutes, 3 minutes with continued posterior lean each time ?  ?  ?  ?  ?  ?  ?  ?  ?  ?  ?  ?  ? ?  ?Cognition Arousal/Alertness: Awake/alert ?Behavior During Therapy: Flat affect ?Overall Cognitive Status: Impaired/Different from baseline ?Area of Impairment: Orientation, Attention, Memory, Following commands, Awareness, Problem solving, Safety/judgement ?  ?  ?  ?  ?  ?  ?  ?  ?  Orientation Level: Place, Time, Situation ?Current Attention Level: Sustained ?Memory: Decreased short-term memory ?Following Commands:  Follows one step commands inconsistently, Follows one step commands with increased time ?Safety/Judgement: Decreased awareness of safety, Decreased awareness of deficits ?Awareness: Intellectual ?Problem Solving: Slow processing, Decreased initiation, Difficulty sequencing, Requires verbal cues, Requires tactile cues ?General Comments: no hallucinations; starting to stand from chair without assistance ?  ?  ? ?  ?Exercises   ? ?  ?General Comments General comments (skin integrity, edema, etc.): Daughter present during session. Dr. Jerral Ralph present. Discussion re: ideal return to ALF with hired sitters for 24/7 care. If not, will need SNF ?  ?  ? ?Pertinent Vitals/Pain Pain Assessment ?Pain Assessment: Faces ?Faces Pain Scale: No hurt ?Breathing: normal ?Negative Vocalization: none ?Facial Expression: smiling or inexpressive ?Body Language: relaxed ?Consolability: no need to console ?PAINAD Score: 0  ? ? ?Home Living   ?  ?  ?  ?  ?  ?  ?  ?  ?  ?   ?  ?Prior Function    ?  ?  ?   ? ?PT Goals (current goals can now be found in the care plan section) Acute Rehab PT Goals ?Patient Stated Goal: unable to state ?Time For Goal Achievement: 03/14/22 ?Potential to Achieve Goals: Fair ?Progress towards PT goals: Progressing toward goals ? ?  ?Frequency ? ? ? Min 3X/week ? ? ? ?  ?PT Plan Current plan remains appropriate  ? ? ?Co-evaluation   ?  ?  ?  ?  ? ?  ?AM-PAC PT "6 Clicks" Mobility   ?Outcome Measure ? Help needed turning from your back to your side while in a flat bed without using bedrails?: A Lot ?Help needed moving from lying on your back to sitting on the side of a flat bed without using bedrails?: A Lot ?Help needed moving to and from a bed to a chair (including a wheelchair)?: A Lot ?Help needed standing up from a chair using your arms (e.g., wheelchair or bedside chair)?: A Lot ?Help needed to walk in hospital room?: A Lot ?Help needed climbing 3-5 steps with a railing? : Total ?6 Click Score: 11 ? ?  ?End of  Session Equipment Utilized During Treatment: Gait belt ?Activity Tolerance: Treatment limited secondary to medical complications (Comment) (reporting dizziness after standing for pericare for BM; could not progress to second round of ambulation) ?Patient left: with call bell/phone within reach;in chair;with family/visitor present;with nursing/sitter in room ?Nurse Communication: Mobility status ?PT Visit Diagnosis: Other abnormalities of gait and mobility (R26.89);Muscle weakness (generalized) (M62.81) ?  ? ? ?Time: 4098-1191 ?PT Time Calculation (min) (ACUTE ONLY): 29 min ? ?Charges:  $Gait Training: 8-22 mins ?$Therapeutic Activity: 8-22 mins          ?          ? ? ?Jerolyn Center, PT ?Acute Rehabilitation Services  ?Pager [phone] ?Office [phone] ? ? ? ?Scherrie November Jeziel Hoffmann ?03/01/2022, 9:42 AM ? ?

## 2022-03-01 NOTE — TOC Transition Note (Signed)
Transition of Care (TOC) - CM/SW Discharge Note ? ? ?Patient Details  ?Name: [name] ?MRN: [id number] ?Date of Birth: [birth year] ? ?Transition of Care (TOC) CM/SW Contact:  ?Benard Halsted, LCSW ?Phone Number: ?03/01/2022, 1:57 PM ? ? ?Clinical Narrative:    ?Patient will DC to: [facility] ?Anticipated DC date: 03/01/22 ?Family notified: Daughters ?Transport by: Car ? ? ?Per MD patient ready for DC to [facility]. RN to call report prior to discharge [phone] Room 504A). RN, patient, patient's family, and facility notified of DC. Discharge Summary and FL2 sent to facility. DNR signed on chart.  ? ?CSW will sign off for now as social work intervention is no longer needed. Please consult [place] again if new needs arise. ? ? ? ? ?Final next level of care: [facility] ?Barriers to Discharge: Barriers Resolved ? ? ?Patient Goals and CMS Choice ?Patient states their goals for this hospitalization and ongoing recovery are:: Rehab ?CMS Medicare.gov Compare Post Acute Care list provided to:: Patient Represenative (must comment) ?Choice offered to / list presented to : Adult Children ? ?Discharge Placement ?PASRR number recieved: 03/01/22 ?           ?Patient chooses bed at: [facility] ?Patient to be transferred to facility by: Car ?Name of family member notified: [name] ?Patient and family notified of of transfer: 03/01/22 ? ?Discharge Plan and Services ?In-house Referral: Clinical Social Work ?  ?           ?  ?  ?  ?  ?  ?  ?  ?  ?  ?  ? ?Social Determinants of Health (SDOH) Interventions ?  ? ? ?Readmission Risk Interventions ?No flowsheet data found. ? ? ? ? ?

## 2022-03-01 NOTE — Discharge Summary (Signed)
? ?PATIENT DETAILS ?Name: [name] ?Age: 86 y.o. ?Sex: female ?Date of Birth: Apr 12, 1936 ?MRN: [id number]. ?Admitting Physician: Jonetta Osgood, MD ?FTD:DUKGURKYH, Steele Berg, MD ? ?Admit Date: 02/26/2022 ?Discharge date: 03/01/2022 ? ?Recommendations for Outpatient Follow-up:  ?Follow up with PCP in 1-2 weeks ?Please obtain CMP/CBC in one week ? ? ?Admitted From:  ?Home ? ?Disposition: ?Skilled nursing facility ?  ?Discharge Condition: ?fair ? ?CODE STATUS: ?  Code Status: DNR  ? ?Diet recommendation:  ?Diet Order   ? ?       ?  Diet general       ?  ?  Diet regular Room service appropriate? Yes; Fluid consistency: Thin  Diet effective now       ?  ? ?  ?  ? ?  ?  ? ?Brief Summary: ?Patient is a 86 y.o.  female with history of Parkinson disease, dementia-who presented with a episode of unresponsiveness that lasted approximately 20 minutes.  She was subsequently admitted to the hospitalist service for further evaluation and treatment. ? ?Significant events: ?3/18>> brought to ED by EMS after daughter found patient at assisted living facility [facility]) unresponsive.  Bradycardic to the 40s. ?  ?Significant studies: ?3/18>> MRI brain: No acute CVA ?3/18>> CXR: No PNA ?3/18>> Spot EEG: No seizures ?  ?Significant microbiology data: ?   ? ?Procedures: ? ?   ?Consults: ?Neurology, cardiology ?  ? ?Brief Hospital Course: ?Unresponsiveness/?  Syncope: Unclear etiology-suspect probably related to autonomic dysfunction in the setting of Parkinson's disease.  She was orthostatic after 3 minutes of standing on 3/19.  No major telemetry events overnight.  MRI brain without acute abnormalities, EEG without seizures.  Echo with stable EF.  Appreciate cardiology/neurology input.  Allow permissive hypertension in order to prevent significant orthostatics.  Given advanced age/dementia-it would not be possible for behavior modifications-per daughter-she will likely not be compliant with TED stockings.  Blood pressure continues  to fluctuate-but most of the time it is well controlled.  Do not think patient requires initiation of midodrine at this point. ?  ?Acute toxic encephalopathy: Likely due to Seroquel that she received for severe agitated delirium.  Encephalopathy has Cheryll Cockayne is now back to her baseline.   ?  ?Sinus bradycardia: Probably a sequelae of autonomic dysfunction in the setting of Parkinson's disease.  However is on rivastigmine that could cause bradycardia as well.  TSH within normal limits.  It is now stable.  Evaluated by cardiology this hospitalization as well. ?  ?Parkinson's disease: On Sinemet ?  ?Dementia with delirium: Supportive care-expect delirium while hospitalized.  Hold rivastigmine-continue Wellbutrin/Zoloft.  Very difficult to manage patient's delirium-per daughter-she has not tolerated benzos, Haldol in the past-she had significant weakness/confusion with low-dose Seroquel.  Suspect best served by being back to her family surroundings as soon as her functional status allows. ? ?Debility/deconditioning: Due to acute illness-evaluated by PT/OT-although ideally she needs to go back to her family surroundings-her independent living facility is not able to take her back with this much of weakness.  After extensive discussion with social worker by Alvin Critchley is being discharged to SNF. ?  ?BMI: ?Estimated body mass index is 21.12 kg/m? as calculated from the following: ?  Height as of this encounter: '5\' 3"'$  (1.6 m). ?  Weight as of this encounter: 54.1 kg.  ? ? ?Discharge Diagnoses:  ?Principal Problem: ?  Altered mental status ?Active Problems: ?  Parkinson's disease ([place]) ?  Alzheimer's dementia ([place]) ?  Hyperlipemia ?  DNR (do not resuscitate)/DNI(Do Not Intubate) ?  Sinus bradycardia ?  Syncope ? ? ?Discharge Instructions: ? ?Activity:  ?As tolerated with Full fall precautions use walker/cane & assistance as needed ? ?Discharge Instructions   ? ? Call MD for:  extreme fatigue   Complete by: As directed ?   ? Call MD for:  persistant dizziness or light-headedness   Complete by: As directed ?  ? Diet general   Complete by: As directed ?  ? Discharge instructions   Complete by: As directed ?  ? Follow with Primary MD  [name], MD in 1-2 weeks ? ?Please get a complete blood count and chemistry panel checked by your Primary MD at your next visit, and again as instructed by your Primary MD. ? ?Get Medicines reviewed and adjusted: ?Please take all your medications with you for your next visit with your Primary MD ? ?Laboratory/radiological data: ?Please request your Primary MD to go over all hospital tests and procedure/radiological results at the follow up, please ask your Primary MD to get all Hospital records sent to his/her office. ? ?In some cases, they will be blood work, cultures and biopsy results pending at the time of your discharge. Please request that your primary care M.D. follows up on these results. ? ?Also Note the following: ?If you experience worsening of your admission symptoms, develop shortness of breath, life threatening emergency, suicidal or homicidal thoughts you must seek medical attention immediately by calling 911 or calling your MD immediately  if symptoms less severe. ? ?You must read complete instructions/literature along with all the possible adverse reactions/side effects for all the Medicines you take and that have been prescribed to you. Take any new Medicines after you have completely understood and accpet all the possible adverse reactions/side effects.  ? ?Do not drive when taking Pain medications or sleeping medications (Benzodaizepines) ? ?Do not take more than prescribed Pain, Sleep and Anxiety Medications. It is not advisable to combine anxiety,sleep and pain medications without talking with your primary care practitioner ? ?Special Instructions: If you have smoked or chewed Tobacco  in the last 2 yrs please stop smoking, stop any regular Alcohol  and or any Recreational  drug use. ? ?Wear Seat belts while driving. ? ?Please note: ?You were cared for by a hospitalist during your hospital stay. Once you are discharged, your primary care physician will handle any further medical issues. Please note that NO REFILLS for any discharge medications will be authorized once you are discharged, as it is imperative that you return to your primary care physician (or establish a relationship with a primary care physician if you do not have one) for your post hospital discharge needs so that they can reassess your need for medications and monitor your lab values.  ? Increase activity slowly   Complete by: As directed ?  ? ?  ? ?Allergies as of 03/01/2022   ? ?   Reactions  ? Aricept [donepezil Hcl]   ? Hallucinations, confusion  ? Cephalexin   ? REACTION: diaphoretic and drop in Blood pressure  ? Lorazepam Other (See Comments)  ? amnesia  ? Melatonin   ? Weird dreams  ? ?  ? ?  ?Medication List  ?  ? ?STOP taking these medications   ? ?rivastigmine 3 MG capsule ?Commonly known as: EXELON ?  ? ?  ? ?TAKE these medications   ? ?buPROPion ER 100 MG 12 hr tablet ?Commonly known as: Wellbutrin SR ?Take 1  tablet (100 mg total) by mouth every morning. ?  ?CALCIUM CITRATE-VITAMIN D PO ?Take 1 capsule by mouth every evening. ?  ?carbidopa-levodopa 25-100 MG tablet ?Commonly known as: SINEMET IR ?TAKE 1 AND 1/2 TABLETS (37.5-'150MG'$ ) BY MOUTH THREE TIMES A DAY WITH MEALS ?What changed:  ?how much to take ?how to take this ?when to take this ?additional instructions ?  ?dorzolamide-timolol 22.3-6.8 MG/ML ophthalmic solution ?Commonly known as: COSOPT ?Place 1 drop into both eyes 3 (three) times daily. ?  ?fluticasone 50 MCG/ACT nasal spray ?Commonly known as: FLONASE ?Place 1 spray into both nostrils in the morning, at noon, and at bedtime. ?  ?Metamucil Smooth Texture 58.6 % powder ?Generic drug: psyllium ?Take 1 packet by mouth daily. ?  ?multivitamin capsule ?Take 1 capsule by mouth at bedtime. ?   ?sertraline 50 MG tablet ?Commonly known as: ZOLOFT ?Take 1 tablet (50 mg total) by mouth daily. ?What changed: when to take this ?  ? ?  ? ? Follow-up Information   ? ? [name], MD. Schedule an a

## 2022-03-02 NOTE — Telephone Encounter (Signed)
Contacted facility and spoke with cathy the nurse, who has already been told to hold meds and she is discontinuing it with by Dr.Aquino's orders. They will contact pharmacy as they do with medications there to d'c it  ?

## 2022-03-04 ENCOUNTER — Encounter: Payer: Self-pay | Admitting: Neurology

## 2022-03-04 DIAGNOSIS — F0150 Vascular dementia without behavioral disturbance: Secondary | ICD-10-CM | POA: Diagnosis not present

## 2022-03-04 DIAGNOSIS — F05 Delirium due to known physiological condition: Secondary | ICD-10-CM | POA: Diagnosis not present

## 2022-03-04 DIAGNOSIS — R451 Restlessness and agitation: Secondary | ICD-10-CM | POA: Diagnosis not present

## 2022-03-04 DIAGNOSIS — G4700 Insomnia, unspecified: Secondary | ICD-10-CM | POA: Diagnosis not present

## 2022-03-07 MED ORDER — CARBIDOPA-LEVODOPA 25-100 MG PO TABS: ORAL_TABLET | ORAL | 3 refills | Status: DC

## 2022-03-08 NOTE — Telephone Encounter (Signed)
Spoke with administrator at the facility, will have a nurse call be back. 10:25am 03/08/2022  ?

## 2022-03-09 DIAGNOSIS — R278 Other lack of coordination: Secondary | ICD-10-CM | POA: Diagnosis not present

## 2022-03-09 DIAGNOSIS — R2681 Unsteadiness on feet: Secondary | ICD-10-CM | POA: Diagnosis not present

## 2022-03-09 DIAGNOSIS — G20 Parkinson's disease: Secondary | ICD-10-CM | POA: Diagnosis not present

## 2022-03-09 DIAGNOSIS — M6281 Muscle weakness (generalized): Secondary | ICD-10-CM | POA: Diagnosis not present

## 2022-03-10 DIAGNOSIS — R2681 Unsteadiness on feet: Secondary | ICD-10-CM | POA: Diagnosis not present

## 2022-03-10 DIAGNOSIS — R278 Other lack of coordination: Secondary | ICD-10-CM | POA: Diagnosis not present

## 2022-03-10 DIAGNOSIS — M6281 Muscle weakness (generalized): Secondary | ICD-10-CM | POA: Diagnosis not present

## 2022-03-10 DIAGNOSIS — G20 Parkinson's disease: Secondary | ICD-10-CM | POA: Diagnosis not present

## 2022-03-14 DIAGNOSIS — R2681 Unsteadiness on feet: Secondary | ICD-10-CM | POA: Diagnosis not present

## 2022-03-14 DIAGNOSIS — R278 Other lack of coordination: Secondary | ICD-10-CM | POA: Diagnosis not present

## 2022-03-14 DIAGNOSIS — G20 Parkinson's disease: Secondary | ICD-10-CM | POA: Diagnosis not present

## 2022-03-14 DIAGNOSIS — M6281 Muscle weakness (generalized): Secondary | ICD-10-CM | POA: Diagnosis not present

## 2022-03-16 DIAGNOSIS — G20 Parkinson's disease: Secondary | ICD-10-CM | POA: Diagnosis not present

## 2022-03-16 DIAGNOSIS — R2681 Unsteadiness on feet: Secondary | ICD-10-CM | POA: Diagnosis not present

## 2022-03-16 DIAGNOSIS — M6281 Muscle weakness (generalized): Secondary | ICD-10-CM | POA: Diagnosis not present

## 2022-03-16 DIAGNOSIS — R278 Other lack of coordination: Secondary | ICD-10-CM | POA: Diagnosis not present

## 2022-03-17 DIAGNOSIS — G20 Parkinson's disease: Secondary | ICD-10-CM | POA: Diagnosis not present

## 2022-03-17 DIAGNOSIS — R278 Other lack of coordination: Secondary | ICD-10-CM | POA: Diagnosis not present

## 2022-03-17 DIAGNOSIS — M6281 Muscle weakness (generalized): Secondary | ICD-10-CM | POA: Diagnosis not present

## 2022-03-17 DIAGNOSIS — R2681 Unsteadiness on feet: Secondary | ICD-10-CM | POA: Diagnosis not present

## 2022-03-18 DIAGNOSIS — G20 Parkinson's disease: Secondary | ICD-10-CM | POA: Diagnosis not present

## 2022-03-18 DIAGNOSIS — M6281 Muscle weakness (generalized): Secondary | ICD-10-CM | POA: Diagnosis not present

## 2022-03-18 DIAGNOSIS — R278 Other lack of coordination: Secondary | ICD-10-CM | POA: Diagnosis not present

## 2022-03-18 DIAGNOSIS — R2681 Unsteadiness on feet: Secondary | ICD-10-CM | POA: Diagnosis not present

## 2022-03-21 DIAGNOSIS — R278 Other lack of coordination: Secondary | ICD-10-CM | POA: Diagnosis not present

## 2022-03-21 DIAGNOSIS — R2681 Unsteadiness on feet: Secondary | ICD-10-CM | POA: Diagnosis not present

## 2022-03-21 DIAGNOSIS — M6281 Muscle weakness (generalized): Secondary | ICD-10-CM | POA: Diagnosis not present

## 2022-03-21 DIAGNOSIS — G20 Parkinson's disease: Secondary | ICD-10-CM | POA: Diagnosis not present

## 2022-03-22 ENCOUNTER — Emergency Department (HOSPITAL_BASED_OUTPATIENT_CLINIC_OR_DEPARTMENT_OTHER)
Admission: EM | Admit: 2022-03-22 | Discharge: 2022-03-22 | Disposition: A | Discharge status: Home / Self Care | Payer: Medicare Other | Attending: Emergency Medicine | Admitting: Emergency Medicine

## 2022-03-22 ENCOUNTER — Ambulatory Visit (HOSPITAL_COMMUNITY)
Admission: EM | Admit: 2022-03-22 | Discharge: 2022-03-22 | Disposition: A | Discharge status: Home / Self Care | Payer: Medicare Other

## 2022-03-22 ENCOUNTER — Emergency Department (HOSPITAL_BASED_OUTPATIENT_CLINIC_OR_DEPARTMENT_OTHER): Payer: Medicare Other

## 2022-03-22 ENCOUNTER — Other Ambulatory Visit: Payer: Self-pay

## 2022-03-22 ENCOUNTER — Encounter (HOSPITAL_BASED_OUTPATIENT_CLINIC_OR_DEPARTMENT_OTHER): Payer: Self-pay | Admitting: Obstetrics and Gynecology

## 2022-03-22 ENCOUNTER — Encounter (HOSPITAL_COMMUNITY): Payer: Self-pay | Admitting: Emergency Medicine

## 2022-03-22 DIAGNOSIS — W01198A Fall on same level from slipping, tripping and stumbling with subsequent striking against other object, initial encounter: Secondary | ICD-10-CM | POA: Diagnosis not present

## 2022-03-22 DIAGNOSIS — R278 Other lack of coordination: Secondary | ICD-10-CM | POA: Diagnosis not present

## 2022-03-22 DIAGNOSIS — R2681 Unsteadiness on feet: Secondary | ICD-10-CM | POA: Diagnosis not present

## 2022-03-22 DIAGNOSIS — Z23 Encounter for immunization: Secondary | ICD-10-CM | POA: Insufficient documentation

## 2022-03-22 DIAGNOSIS — S0990XA Unspecified injury of head, initial encounter: Secondary | ICD-10-CM | POA: Diagnosis not present

## 2022-03-22 DIAGNOSIS — G20 Parkinson's disease: Secondary | ICD-10-CM | POA: Diagnosis not present

## 2022-03-22 DIAGNOSIS — M6281 Muscle weakness (generalized): Secondary | ICD-10-CM | POA: Diagnosis not present

## 2022-03-22 DIAGNOSIS — S0181XA Laceration without foreign body of other part of head, initial encounter: Secondary | ICD-10-CM | POA: Insufficient documentation

## 2022-03-22 MED ORDER — BACITRACIN ZINC 500 UNIT/GM EX OINT: 1.0000 "application " | TOPICAL_OINTMENT | Freq: Two times a day (BID) | CUTANEOUS | 0 refills | Status: DC

## 2022-03-22 MED ORDER — LIDOCAINE-EPINEPHRINE (PF) 2 %-1:200000 IJ SOLN
10.0000 mL | Freq: Once | INTRAMUSCULAR | Status: AC
  Administered 2022-03-22: 10 mL
  Filled 2022-03-22: qty 20

## 2022-03-22 MED ORDER — TETANUS-DIPHTH-ACELL PERTUSSIS 5-2.5-18.5 LF-MCG/0.5 IM SUSY
0.5000 mL | PREFILLED_SYRINGE | Freq: Once | INTRAMUSCULAR | Status: AC
  Administered 2022-03-22: 0.5 mL via INTRAMUSCULAR
  Filled 2022-03-22: qty 0.5

## 2022-03-22 MED ORDER — LIDOCAINE-EPINEPHRINE-TETRACAINE (LET) TOPICAL GEL
3.0000 mL | Freq: Once | TOPICAL | Status: AC
  Administered 2022-03-22: 3 mL via TOPICAL
  Filled 2022-03-22: qty 3

## 2022-03-22 MED ORDER — ACETAMINOPHEN 325 MG PO TABS
650.0000 mg | ORAL_TABLET | Freq: Once | ORAL | Status: AC
  Administered 2022-03-22: 650 mg via ORAL
  Filled 2022-03-22: qty 2

## 2022-03-22 NOTE — ED Notes (Signed)
Patient is being discharged from the Urgent Care and sent to the Emergency Department via pov . Per Dr Windy Carina, patient is in need of higher level of care due to  nature of injury, Striking of head in elderly population. Patient is aware and verbalizes understanding of plan of care.  ?Vitals:  ? 03/22/22 1355  ?BP: (!) 172/54  ?Pulse: 60  ?Resp: 20  ?Temp: 97.8 ?F (36.6 ?C)  ?SpO2: 97%  ?  ?

## 2022-03-22 NOTE — ED Triage Notes (Signed)
Patient fell today while working with occupational therapist.  Patient actually hit left forehead on door facing.  Patient had no loc .  Daughter reports patient is speaking and answering appropriately.  Patient not taking blood thinners.  Laceration bleeding is under control.  Patient is alert and oriented to time and place and situation.   ?

## 2022-03-22 NOTE — Discharge Instructions (Signed)
Laceration was repaired with stitches.  The stitches will dissolve on their own in about 7 to 14 days. ? ?Until then, keep the area clean and dry.  You may shampoo after 24 hours, try to use unscented, uncovered shampoo if possible. ? ?You can apply antibiotic ointment such as over-the-counter Neosporin as needed. ? ?Return to the ER if you start noticing any purulent drainage or severe redness, pain. ?

## 2022-03-22 NOTE — ED Notes (Signed)
Explained to patient and daughter that wound could be addressed at this location, but unable to do a ct.  Dr Windy Carina explained that she could go to ED for CT scan and explained that consequences of bleeding in head.   ?

## 2022-03-22 NOTE — ED Provider Notes (Signed)
?[facility] ?Provider Note ? ? ?CSN: [id number] ?Arrival date & time: 03/22/22  1442 ? ?  ? ?History ? ?Chief Complaint  ?Patient presents with  ? Fall  ? ? ?[name] is a 86 y.o. female. ? ?HPI ? ?  ? ?87 year old female comes in with chief complaint of fall.  Patient had a fall earlier today because of a mechanical issue.  She thinks that the forehead struck to the door, and patient started having instant bleeding.  The fall was witnessed by occupational therapist at her facility.  Patient denies severe headaches at this time as any loss of consciousness, numbness, tingling that is focal or seizure-like activity.  Family at the bedside patient has been able to ambulate after the injury.  She has no chest pain, shortness of breath, back pain. ? ?Home Medications ?Prior to Admission medications   ?Medication Sig Start Date End Date Taking? Authorizing Provider  ?buPROPion ER (WELLBUTRIN SR) 100 MG 12 hr tablet Take 1 tablet (100 mg total) by mouth every morning. 02/16/22   Thayer Headings, [place]  ?CALCIUM CITRATE-VITAMIN D PO Take 1 capsule by mouth every evening.    [provider]  ?carbidopa-levodopa (SINEMET IR) 25-100 MG tablet TAKE 1 BY MOUTH THREE TIMES A DAY WITH MEALS 03/07/22   Cameron Sprang, MD  ?dorzolamide-timolol (COSOPT) 22.3-6.8 MG/ML ophthalmic solution Place 1 drop into both eyes 3 (three) times daily.    [provider]  ?fluticasone (FLONASE) 50 MCG/ACT nasal spray Place 1 spray into both nostrils in the morning, at noon, and at bedtime.    [provider]  ?Multiple Vitamin (MULTIVITAMIN) capsule Take 1 capsule by mouth at bedtime.    [provider]  ?psyllium (METAMUCIL SMOOTH TEXTURE) 58.6 % powder Take 1 packet by mouth daily. 08/03/20   [name], MD  ?sertraline (ZOLOFT) 50 MG tablet Take 1 tablet (50 mg total) by mouth daily. ?Patient taking differently: Take 50 mg by mouth at bedtime. 01/11/22   Thayer Headings,  [place]  ?   ? ?Allergies    ?Aricept [donepezil hcl], Cephalexin, Lorazepam, and Seroquel [quetiapine]   ? ?Review of Systems   ?Review of Systems  ?Gastrointestinal:  Negative for vomiting.  ?Skin:  Negative for color change and rash.  ?Neurological:  Negative for syncope.  ?All other systems reviewed and are negative. ? ?Physical Exam ?Updated Vital Signs ?BP (!) 173/76 (BP Location: Right Arm)   Pulse (!) 52   Temp 98.4 ?F (36.9 ?C)   Resp 17   SpO2 98%  ?Physical Exam ?Vitals and nursing note reviewed.  ?Constitutional:   ?   Appearance: She is well-developed.  ?HENT:  ?   Head: Atraumatic.  ?Cardiovascular:  ?   Rate and Rhythm: Normal rate.  ?Pulmonary:  ?   Effort: Pulmonary effort is normal.  ?Musculoskeletal:  ?   Cervical back: Normal range of motion and neck supple.  ?Skin: ?   General: Skin is warm and dry.  ?   Comments: Recent [place] linear laceration over the left frontal forehead  ?Neurological:  ?   Mental Status: She is alert and oriented to person, place, and time.  ? ? ?ED Results / Procedures / Treatments   ?Labs ?(all labs ordered are listed, but only abnormal results are displayed) ?Labs Reviewed - No data to display ? ?EKG ?None ? ?Radiology ?CT Head Wo Contrast ? ?Result Date: 03/22/2022 ?CLINICAL DATA:  Trauma, fall EXAM: CT HEAD WITHOUT CONTRAST  TECHNIQUE: Contiguous axial images were obtained from the base of the skull through the vertex without intravenous contrast. RADIATION DOSE REDUCTION: This exam was performed according to the departmental dose-optimization program which includes automated exposure control, adjustment of the mA and/or kV according to patient size and/or use of iterative reconstruction technique. COMPARISON:  06/09/2021 FINDINGS: Brain: No acute intracranial findings are seen. Cortical sulci are prominent. Are no epidural or subdural fluid collections. Vascular: Unremarkable. Skull: No fracture is seen. Sinuses/Orbits: Unremarkable. Other: None IMPRESSION: No  acute intracranial findings are seen in the noncontrast CT brain. Atrophy. Electronically Signed   By: Elmer Picker M.D.   On: 03/22/2022 15:22   ? ?Procedures ?[name].Laceration Repair ? ?Date/Time: 03/22/2022 6:22 PM ?Performed by: Varney Biles, MD ?Authorized by: Varney Biles, MD  ? ?Consent:  ?  Consent obtained:  Verbal ?  Consent given by:  Patient and guardian ?  Risks, benefits, and alternatives were discussed: yes   ?  Risks discussed:  Infection, pain, poor cosmetic result and poor wound healing ?  Alternatives discussed:  No treatment ?Universal protocol:  ?  Procedure explained and questions answered to patient or proxy's satisfaction: yes   ?  Patient identity confirmed:  Arm band ?Anesthesia:  ?  Anesthesia method:  Topical application and local infiltration ?  Topical anesthetic:  LET ?  Local anesthetic:  Lidocaine 2% WITH epi ?Laceration details:  ?  Location:  Face ?  Face location:  Forehead ?  Length (cm):  3 ?  Depth (mm):  5 ?Pre-procedure details:  ?  Preparation:  Patient was prepped and draped in usual sterile fashion and imaging obtained to evaluate for foreign bodies ?Exploration:  ?  Hemostasis achieved with:  Direct pressure ?  Imaging outcome: foreign body not noted   ?  Wound exploration: wound explored through full range of motion and entire depth of wound visualized   ?  Wound extent: fascia violated   ?  Contaminated: yes   ?Treatment:  ?  Area cleansed with:  Soap and water ?  Amount of cleaning:  Standard ?  Irrigation solution:  Sterile water ?  Irrigation method:  Syringe ?  Debridement:  None ?  Undermining:  None ?  Scar revision: no   ?Skin repair:  ?  Repair method:  Sutures ?  Suture size:  5-0 ?  Suture material:  Fast-absorbing gut ?  Suture technique:  Simple interrupted ?  Number of sutures:  5 ?Approximation:  ?  Approximation:  Close ?Repair type:  ?  Repair type:  Complex ?Post-procedure details:  ?  Dressing:  Open (no dressing) ?  Procedure completion:   Tolerated well, no immediate complications  ? ? ?Medications Ordered in ED ?Medications  ?Tdap (BOOSTRIX) injection 0.5 mL (has no administration in time range)  ?lidocaine-EPINEPHrine-tetracaine (LET) topical gel (3 mLs Topical Given 03/22/22 1637)  ?acetaminophen (TYLENOL) tablet 650 mg (650 mg Oral Given 03/22/22 1756)  ?lidocaine-EPINEPHrine (XYLOCAINE W/EPI) 2 %-1:200000 (PF) injection 10 mL (10 mLs Infiltration Given 03/22/22 1807)  ? ? ?ED Course/ Medical Decision Making/ A&P ?  ?                        ?Medical Decision Making ?Amount and/or Complexity of Data Reviewed ?Radiology: ordered. ? ?Risk ?OTC drugs. ?Prescription drug management. ? ? ?86 year old female comes in with chief complaint of fall. ? ?As a result of her fall, she has laceration to her face. ? ?On  exam, patient does not have any C-spine tenderness, no red flag suggesting elevated ICP or C-spine injury.  CT C-spine not ordered secondary to [place] CT C-spine rule.  Patient has no significant trauma over the torso, lung exam is clear, abdomen is nontender and patient is moving all extremities. ? ?CT head ordered given her age, it is negative.  Laceration repaired. ?Wound care precautions discussed ?Tetanus updated ? ? ?Final Clinical Impression(s) / ED Diagnoses ?Final diagnoses:  ?Facial laceration, initial encounter  ?Traumatic injury of head, initial encounter  ? ? ?Rx / DC Orders ?ED Discharge Orders   ? ? None  ? ?  ? ? ?  ?Varney Biles, MD ?03/22/22 1825 ? ?

## 2022-03-22 NOTE — ED Notes (Signed)
Discharge paperwork given and understood. 

## 2022-03-22 NOTE — ED Triage Notes (Signed)
Patient reports to the ER after tripping and hitting her left forehead on a door. Patient went to [facility] and was sent here for a head CT ?

## 2022-03-22 NOTE — ED Notes (Addendum)
Dr Windy Carina called to in take to evaluate patient for ucc . Daughter reluctant to go to ED due to extensive wait times she had experienced in the past.  Patient staying for suturing per daughter ?

## 2022-03-22 NOTE — ED Notes (Signed)
[name], patient access,reports patient and daughter left going to [place] location.   ?

## 2022-03-23 DIAGNOSIS — R2681 Unsteadiness on feet: Secondary | ICD-10-CM | POA: Diagnosis not present

## 2022-03-23 DIAGNOSIS — M6281 Muscle weakness (generalized): Secondary | ICD-10-CM | POA: Diagnosis not present

## 2022-03-23 DIAGNOSIS — G20 Parkinson's disease: Secondary | ICD-10-CM | POA: Diagnosis not present

## 2022-03-23 DIAGNOSIS — R278 Other lack of coordination: Secondary | ICD-10-CM | POA: Diagnosis not present

## 2022-03-24 DIAGNOSIS — M6281 Muscle weakness (generalized): Secondary | ICD-10-CM | POA: Diagnosis not present

## 2022-03-24 DIAGNOSIS — R2681 Unsteadiness on feet: Secondary | ICD-10-CM | POA: Diagnosis not present

## 2022-03-24 DIAGNOSIS — G20 Parkinson's disease: Secondary | ICD-10-CM | POA: Diagnosis not present

## 2022-03-24 DIAGNOSIS — R278 Other lack of coordination: Secondary | ICD-10-CM | POA: Diagnosis not present

## 2022-03-28 ENCOUNTER — Other Ambulatory Visit: Payer: Self-pay

## 2022-03-28 ENCOUNTER — Encounter (HOSPITAL_COMMUNITY): Payer: Self-pay

## 2022-03-28 ENCOUNTER — Emergency Department (HOSPITAL_COMMUNITY)
Admission: EM | Admit: 2022-03-28 | Discharge: 2022-03-28 | Disposition: A | Discharge status: Home / Self Care | Payer: Medicare Other | Attending: Emergency Medicine | Admitting: Emergency Medicine

## 2022-03-28 ENCOUNTER — Emergency Department (HOSPITAL_COMMUNITY): Payer: Medicare Other

## 2022-03-28 DIAGNOSIS — R404 Transient alteration of awareness: Secondary | ICD-10-CM | POA: Diagnosis not present

## 2022-03-28 DIAGNOSIS — G309 Alzheimer's disease, unspecified: Secondary | ICD-10-CM

## 2022-03-28 DIAGNOSIS — R001 Bradycardia, unspecified: Secondary | ICD-10-CM | POA: Diagnosis not present

## 2022-03-28 DIAGNOSIS — G20 Parkinson's disease: Secondary | ICD-10-CM | POA: Insufficient documentation

## 2022-03-28 DIAGNOSIS — F0280 Dementia in other diseases classified elsewhere without behavioral disturbance: Secondary | ICD-10-CM | POA: Diagnosis not present

## 2022-03-28 DIAGNOSIS — I10 Essential (primary) hypertension: Secondary | ICD-10-CM | POA: Diagnosis not present

## 2022-03-28 DIAGNOSIS — R4182 Altered mental status, unspecified: Secondary | ICD-10-CM | POA: Diagnosis not present

## 2022-03-28 DIAGNOSIS — F349 Persistent mood [affective] disorder, unspecified: Secondary | ICD-10-CM | POA: Diagnosis not present

## 2022-03-28 DIAGNOSIS — R7309 Other abnormal glucose: Secondary | ICD-10-CM | POA: Insufficient documentation

## 2022-03-28 DIAGNOSIS — F419 Anxiety disorder, unspecified: Secondary | ICD-10-CM | POA: Diagnosis not present

## 2022-03-28 DIAGNOSIS — F062 Psychotic disorder with delusions due to known physiological condition: Secondary | ICD-10-CM | POA: Diagnosis not present

## 2022-03-28 LAB — URINALYSIS, ROUTINE W REFLEX MICROSCOPIC
Bilirubin Urine: NEGATIVE
Glucose, UA: NEGATIVE mg/dL
Hgb urine dipstick: NEGATIVE
Ketones, ur: NEGATIVE mg/dL
Nitrite: NEGATIVE
Protein, ur: NEGATIVE mg/dL
Specific Gravity, Urine: 1.015 (ref 1.005–1.030)
pH: 8 (ref 5.0–8.0)

## 2022-03-28 LAB — COMPREHENSIVE METABOLIC PANEL
ALT: 21 U/L (ref 0–44)
AST: 18 U/L (ref 15–41)
Albumin: 3.6 g/dL (ref 3.5–5.0)
Alkaline Phosphatase: 52 U/L (ref 38–126)
Anion gap: 6 (ref 5–15)
BUN: 12 mg/dL (ref 8–23)
CO2: 26 mmol/L (ref 22–32)
Calcium: 8.9 mg/dL (ref 8.9–10.3)
Chloride: 108 mmol/L (ref 98–111)
Creatinine, Ser: 0.95 mg/dL (ref 0.44–1.00)
GFR, Estimated: 59 mL/min — ABNORMAL LOW (ref >60)
Glucose, Bld: 93 mg/dL (ref 70–99)
Potassium: 4.1 mmol/L (ref 3.5–5.1)
Sodium: 140 mmol/L (ref 135–145)
Total Bilirubin: 0.9 mg/dL (ref 0.3–1.2)
Total Protein: 6 g/dL — ABNORMAL LOW (ref 6.5–8.1)

## 2022-03-28 LAB — CBC
HCT: 40.1 % (ref 36.0–46.0)
Hemoglobin: 13.2 g/dL (ref 12.0–15.0)
MCH: 31.8 pg (ref 26.0–34.0)
MCHC: 32.9 g/dL (ref 30.0–36.0)
MCV: 96.6 fL (ref 80.0–100.0)
Platelets: 228 10*3/uL (ref 150–400)
RBC: 4.15 MIL/uL (ref 3.87–5.11)
RDW: 12.5 % (ref 11.5–15.5)
WBC: 5 10*3/uL (ref 4.0–10.5)
nRBC: 0 % (ref 0.0–0.2)

## 2022-03-28 LAB — CBG MONITORING, ED: Glucose-Capillary: 96 mg/dL (ref 70–99)

## 2022-03-28 NOTE — ED Triage Notes (Signed)
Pt bib [facility] from [facility] where she was found on the couch in her room this am around 10, not speaking and not acting right according to the facility. Per EMS pt was nonverbal but able to follow commands. Pt arrives AOx3 and able to follow commands. Pt does have parkinson's and dementia at baseline. EMS reports contusion and laceration to left forehead, pt has history of same from previous fall. No LSW due to being in [place]. No blood thinners.  ?EMS vitals: 56HR, 103 CBG, 180/70 ?

## 2022-03-28 NOTE — ED Provider Notes (Signed)
?[facility] ?Provider Note ? ? ?CSN: [id number] ?Arrival date & time: 03/28/22  1101 ? ?  ? ?History ? ?Chief Complaint  ?Patient presents with  ? Altered Mental Status  ? ? ?[name] is a 86 y.o. female with a past medical history of hyperlipidemia, hypertension, depression, Parkinson's, Alzheimer's who presents from her assisted living facility earlier this morning.  Patient was found by CNA's he reports that she was shaking uncontrollably, and then became nonverbal, somnolent but was able to follow commands.  On arrival today patient speaking normally with no signs of dysarthria, denies any pain, recent illness, fever, chills, or other somatic complaints.  Alert and oriented to herself but not to the time or place which is consistent with her baseline. ? ? ?Altered Mental Status ?Presenting symptoms: confusion   ? ?  ? ?Home Medications ?Prior to Admission medications   ?Medication Sig Start Date End Date Taking? Authorizing Provider  ?buPROPion ER (WELLBUTRIN SR) 100 MG 12 hr tablet Take 1 tablet (100 mg total) by mouth every morning. 02/16/22  Yes Thayer Headings, [place]  ?Calcium Carbonate-Vitamin D (CALTRATE 600+D PO) Take 1 each by mouth daily.   Yes [provider]  ?carbidopa-levodopa (SINEMET IR) 25-100 MG tablet TAKE 1 BY MOUTH THREE TIMES A DAY WITH MEALS ?Patient taking differently: Take 1 tablet by mouth in the morning, at noon, and at bedtime. 03/07/22  Yes Cameron Sprang, MD  ?dorzolamide-timolol (COSOPT) 22.3-6.8 MG/ML ophthalmic solution Place 1 drop into both eyes 3 (three) times daily.   Yes [provider]  ?fluticasone (FLONASE) 50 MCG/ACT nasal spray Place 1 spray into both nostrils in the morning, at noon, and at bedtime.   Yes [provider]  ?Melatonin 1 MG CHEW Chew 0.5 mg by mouth at bedtime as needed (sleep).   Yes [provider]  ?Multiple Vitamin (MULTIVITAMIN) tablet Take 1 tablet by mouth daily. Centrum  Silver   Yes [provider]  ?psyllium (METAMUCIL SMOOTH TEXTURE) 58.6 % powder Take 1 packet by mouth daily. ?Patient taking differently: Take 1 packet by mouth daily as needed (constipation). 08/03/20  Yes Koberlein, Steele Berg, MD  ?sertraline (ZOLOFT) 50 MG tablet Take 1 tablet (50 mg total) by mouth daily. ?Patient taking differently: Take 50 mg by mouth at bedtime. 01/11/22  Yes Thayer Headings, [place]  ?bacitracin ointment Apply 1 application. topically 2 (two) times daily. ?Patient not taking: Reported on 03/28/2022 03/22/22   Varney Biles, MD  ?   ? ?Allergies    ?Aricept [donepezil hcl], Cephalexin, Lorazepam, and Seroquel [quetiapine]   ? ?Review of Systems   ?Review of Systems  ?Psychiatric/Behavioral:  Positive for confusion.   ?All other systems reviewed and are negative. ? ?Physical Exam ?Updated Vital Signs ?BP (!) 147/75   Pulse (!) 53   Temp (!) 97.5 ?F (36.4 ?C) (Oral)   Resp 15   SpO2 97%  ?Physical Exam ?Vitals and nursing note reviewed.  ?Constitutional:   ?   General: She is not in acute distress. ?   Appearance: Normal appearance.  ?HENT:  ?   Head: Normocephalic and atraumatic.  ?Eyes:  ?   General:     ?   Right eye: No discharge.     ?   Left eye: No discharge.  ?Cardiovascular:  ?   Rate and Rhythm: Normal rate and regular rhythm.  ?   Heart sounds: No murmur heard. ?  No friction rub. No gallop.  ?  Pulmonary:  ?   Effort: Pulmonary effort is normal.  ?   Breath sounds: Normal breath sounds.  ?Abdominal:  ?   General: Bowel sounds are normal.  ?   Palpations: Abdomen is soft.  ?Skin: ?   General: Skin is warm and dry.  ?   Capillary Refill: Capillary refill takes less than 2 seconds.  ?Neurological:  ?   Mental Status: She is alert.  ?   Comments: Cranial nerves II through XII grossly intact.  Intact finger-nose, intact heel-to-shin.  Romberg negative, gait normal.  Patient is alert and oriented to herself but not to time or place.  This is similar compared to her baseline per  family.  Moves all 4 limbs spontaneously, normal coordination.  No pronator drift.  Intact strength 5 out of 5 bilateral upper and lower extremities. ? ?  ?Psychiatric:     ?   Mood and Affect: Mood normal.     ?   Behavior: Behavior normal.  ? ? ?ED Results / Procedures / Treatments   ?Labs ?(all labs ordered are listed, but only abnormal results are displayed) ?Labs Reviewed  ?COMPREHENSIVE METABOLIC PANEL - Abnormal; Notable for the following components:  ?    Result Value  ? Total Protein 6.0 (*)   ? GFR, Estimated 59 (*)   ? All other components within normal limits  ?CBC  ?URINALYSIS, ROUTINE W REFLEX MICROSCOPIC  ?CBG MONITORING, ED  ? ? ?EKG ?None ? ?Radiology ?CT Head Wo Contrast ? ?Result Date: 03/28/2022 ?CLINICAL DATA:  Mental status change, unknown cause EXAM: CT HEAD WITHOUT CONTRAST TECHNIQUE: Contiguous axial images were obtained from the base of the skull through the vertex without intravenous contrast. RADIATION DOSE REDUCTION: This exam was performed according to the departmental dose-optimization program which includes automated exposure control, adjustment of the mA and/or kV according to patient size and/or use of iterative reconstruction technique. COMPARISON:  CT head March 22, 2022. FINDINGS: Brain: No evidence of acute large vascular territory infarction, hemorrhage, hydrocephalus, extra-axial collection or mass lesion/mass effect. Cerebral atrophy. Vascular: No hyperdense vessel identified. Skull: No acute fracture. Sinuses/Orbits: Mild paranasal sinus mucosal thickening. No acute orbital findings. Other: No mastoid effusions. IMPRESSION: 1. No evidence of acute intracranial abnormality. 2.  Cerebral atrophy (ICD10-G31.9). Electronically Signed   By: Margaretha Sheffield M.D.   On: 03/28/2022 12:45   ? ?Procedures ?Procedures  ? ? ?Medications Ordered in ED ?Medications - No data to display ? ?ED Course/ Medical Decision Making/ A&P ?  ?                        ?Medical Decision Making ?Amount  and/or Complexity of Data Reviewed ?Labs: ordered. ?Radiology: ordered. ? ? ?This patient presents to the ED for concern of confusion, shaking, altered mental status, this involves an extensive number of treatment options, and is a complaint that carries with it a high risk of complications and morbidity. The emergent differential diagnosis prior to evaluation includes, but is not limited to, stroke, new seizure, other intracranial abnormality, electrolyte disturbance, sepsis, bacteremia.  ? ?This is not an exhaustive differential.  ? ?Past Medical History / Co-morbidities / Social History: ?hyperlipidemia, hypertension, depression, Parkinson's, Alzheimer's, patient is demented at baseline with orientation only to herself.  She lives in an assisted care facility. ? ?Additional history: ?Additional history obtained from patient's daughter, patient care facility. External records from outside source obtained and reviewed including previous neurology, family medicine visits. ? ?Physical  Exam: ?Physical exam performed. The pertinent findings include: Patient with no focal neurologic deficits to me, she appears stable compared to her baseline.  She appears well on physical exam. ? ?Lab Tests: ?I ordered, and personally interpreted labs.  The pertinent results include: Unremarkable CMP, CBC, CBG.  Urinalysis in process at time of discharge.  CT head without contrast with no acute intracranial abnormalities. ?  ?Imaging Studies: ?I ordered imaging studies including CT head wo contrast. I independently visualized and interpreted imaging which showed no intracranial abnormality. I agree with the radiologist interpretation. ?  ?Disposition: ?After consideration of the diagnostic results and the patients response to treatment, I feel that patient appears stable compared to her baseline and stable for discharge at this time.  Her daughter agrees with this plan.  Encouraged return to the emergency department if patient's  condition worsens or fails to improve.  ? ?I discussed this case with my attending physician Dr. Reather Converse who cosigned this note including patient's presenting symptoms, physical exam, and planned diagnostics and inte

## 2022-03-28 NOTE — Discharge Instructions (Addendum)
As we discussed the patient is well-appearing, I do not have any concern for her mental status, concern for acute stroke or other abnormality at this time.  Possible that she was taking a little while to wake up to her normal self this morning, we do not see any evidence of acute stroke or other abnormality on her work-up today.  Please return to the emergency department if you have any questions or concerns about worsening health. ?

## 2022-03-29 DIAGNOSIS — G20 Parkinson's disease: Secondary | ICD-10-CM | POA: Diagnosis not present

## 2022-03-29 DIAGNOSIS — M6281 Muscle weakness (generalized): Secondary | ICD-10-CM | POA: Diagnosis not present

## 2022-03-29 DIAGNOSIS — R2681 Unsteadiness on feet: Secondary | ICD-10-CM | POA: Diagnosis not present

## 2022-03-29 DIAGNOSIS — R278 Other lack of coordination: Secondary | ICD-10-CM | POA: Diagnosis not present

## 2022-03-31 ENCOUNTER — Ambulatory Visit (INDEPENDENT_AMBULATORY_CARE_PROVIDER_SITE_OTHER): Payer: Medicare Other | Admitting: Psychiatry

## 2022-03-31 ENCOUNTER — Encounter: Payer: Self-pay | Admitting: Psychiatry

## 2022-03-31 DIAGNOSIS — F330 Major depressive disorder, recurrent, mild: Secondary | ICD-10-CM | POA: Diagnosis not present

## 2022-03-31 DIAGNOSIS — F411 Generalized anxiety disorder: Secondary | ICD-10-CM

## 2022-03-31 DIAGNOSIS — M6281 Muscle weakness (generalized): Secondary | ICD-10-CM | POA: Diagnosis not present

## 2022-03-31 DIAGNOSIS — R2681 Unsteadiness on feet: Secondary | ICD-10-CM | POA: Diagnosis not present

## 2022-03-31 DIAGNOSIS — R278 Other lack of coordination: Secondary | ICD-10-CM | POA: Diagnosis not present

## 2022-03-31 DIAGNOSIS — G20 Parkinson's disease: Secondary | ICD-10-CM | POA: Diagnosis not present

## 2022-03-31 MED ORDER — SERTRALINE HCL 50 MG PO TABS: 50.0000 mg | ORAL_TABLET | Freq: Every day | ORAL | 2 refills | Status: DC

## 2022-03-31 MED ORDER — BUPROPION HCL ER (SR) 100 MG PO TB12: 100.0000 mg | ORAL_TABLET | Freq: Every morning | ORAL | 1 refills | Status: DC

## 2022-03-31 NOTE — Progress Notes (Signed)
[name] ?[id number] ?October 05, 1936 ?86 y.o. ? ?Subjective:  ? ?Patient ID:  [name] is a 86 y.o. (DOB 02/15/1936) female. ? ?Chief Complaint:  ?Chief Complaint  ?Patient presents with  ? Follow-up  ?  Depression, anxiety  ? ? ?HPI ?[name] presents to the office today for follow-up of depression and anxiety. She is accompanied by her daughter, [name], who reports that on 02/26/22 she was was taken to the ER since she was difficult to arouse. She was hospitalized for a few nights and then went to rehab for over a week. She returned to [facility] on 03/07/22.  ? ?Daughter reports that they notices she was not sleeping well at [facility] for physical rehab. She was started in Melatonin 0.5 mg at bedtime and this seemed to be effective. Daughter reports that she started having some sundowning around 03/12/22. Melatonin was increased to 1 mg and this seems to be effective for insomnia and she sleeps for about 10 hours. She had a fall around 03/21/22 or 03/22/22 and sustained a laceration from falling into a door frame. Family has started "Living Will" services to check in on pt at [facility]. Daughter reports that EMS was called one morning when pt was "having a bad morning." Daughter reports that pt has been back in her apartment and "everything has been going fine." She has been advised to use a walker since her fall.  ? ?Daughter reports that family would like to continue Wellbutrin SR. Daughter reports that Wellbutrin "seems to be helping... she is in good spirits." Continues to have some difficulty in the morning. Pt reports "the sense of urgency is not so bad." Daughter reports that pt has not had an "meltdowns" or crying episodes. She reports that her energy is "off and on." Motivation has been good and participating in physical therapy. Daughter reports that pt has wanted to do her own laundry again. Appetite has been good. She reports difficulty with memory. Concentration has been good. Denies current SI.   ? ? ?Sertaline ?Paxil ?Lexapro- Started in 2017 and took for about a year ?Prozac- Took after Lexapro and took for about a year ?Aricept- Adverse reaction ?Exelon ?Ativan- Adverse reaction ?Seroquel- Adverse reaction. Slept for 16 hours. Appeared to be responding to internal stimuli. Acute confusion.  ? ?Mini-Mental   ? ?[place] Office Visit from 06/19/2018 in [facility] [place] Visit from 12/18/2017 in [facility] [place] Visit from 09/26/2017 in [place] at [facility]  ?Total Score (max 30 points ) '27 28 28  '$ ? ?  ? ?PHQ2-9   ? ?Flowsheet Row Clinical Support from 12/22/2021 in [place] [facility] from 11/09/2020 in [place] at [facility] from 09/16/2019 in [place] at [facility] from 06/17/2019 in [place] at [facility] from 01/22/2019 in [place] [facility]  ?PHQ-2 Total Score 1 1 0 0 1  ?PHQ-9 Total Score -- 1 0 0 2  ? ?  ? ?[place] ED from 03/28/2022 in [place] ?Most recent reading at 03/28/2022 11:27 AM ED from 03/22/2022 in [facility] ?Most recent reading at 03/22/2022  2:59 PM ED from 03/22/2022 in [facility] at [facility] ?Most recent reading at 03/22/2022  2:03 PM  ?C-SSRS RISK CATEGORY No Risk No Risk No Risk  ? ?  ?  ? ?Review of Systems:  ?Review of Systems  ?Musculoskeletal:  Positive for gait problem.  ?  Neurological:  Positive for tremors.  ?Psychiatric/Behavioral:    ?     Please refer to HPI  ? ?Medications: I have reviewed the patient's current medications. ? ?Current Outpatient Medications  ?Medication Sig Dispense Refill  ? Calcium Carbonate-Vitamin D (CALTRATE 600+D PO) Take 1 each by mouth daily.    ? carbidopa-levodopa (SINEMET IR) 25-100 MG tablet TAKE 1 BY MOUTH THREE TIMES A DAY WITH MEALS (Patient taking differently: Take 1 tablet by mouth in the  morning, at noon, and at bedtime.) 90 tablet 3  ? dorzolamide-timolol (COSOPT) 22.3-6.8 MG/ML ophthalmic solution Place 1 drop into both eyes 3 (three) times daily.    ? fluticasone (FLONASE) 50 MCG/ACT nasal spray Place 1 spray into both nostrils in the morning, at noon, and at bedtime.    ? Melatonin 1 MG CHEW Chew 1 mg by mouth at bedtime as needed (sleep).    ? Multiple Vitamin (MULTIVITAMIN) tablet Take 1 tablet by mouth daily. Centrum Silver    ? psyllium (METAMUCIL SMOOTH TEXTURE) 58.6 % powder Take 1 packet by mouth daily. (Patient taking differently: Take 1 packet by mouth daily as needed (constipation).) 283 g 12  ? bacitracin ointment Apply 1 application. topically 2 (two) times daily. (Patient not taking: Reported on 03/28/2022) 14 g 0  ? buPROPion ER (WELLBUTRIN SR) 100 MG 12 hr tablet Take 1 tablet (100 mg total) by mouth every morning. 90 tablet 1  ? sertraline (ZOLOFT) 50 MG tablet Take 1 tablet (50 mg total) by mouth daily. 90 tablet 2  ? ?No current facility-administered medications for this visit.  ? ? ?Medication Side Effects: None ? ?Allergies:  ?Allergies  ?Allergen Reactions  ? Aricept [Donepezil Hcl]   ?  Hallucinations, confusion  ? Cephalexin   ?  REACTION: diaphoretic and drop in Blood pressure  ? Lorazepam Other (See Comments)  ?  amnesia  ? Seroquel [Quetiapine]   ?  Slept 16 hours. Appeared to be responding to internal stimuli. Acute confusion.   ? ? ?Past Medical History:  ?Diagnosis Date  ? Allergic rhinitis 02/07/2008  ? Contact dermatitis 03/16/2013  ? Generalized anxiety disorder 02/19/2013  ? has seen [name], [name], and now [name] for counseling  ? Hemorrhoids, external   ? Hot flashes   ? Hyperlipemia 02/07/2008  ? Long standing problem. Pretreatment LDL 175 in 2006, 210 in 2010.  Had tried lipitor but question of rash after 3 days of use. Switched to zocor but reports having leg pain. Retried Lipitor - weakness.  Currently on no medications   ? Hypertension   ?  Irritable bowel syndrome   ? Major depressive disorder 03/18/2016  ? Major neurocognitive disorder 01/27/2019  ? possible Alzheimer's disease or mixed presentation  ? Menopausal symptom 11/18/2019  ? Palpitations 02/19/2013  ? Onset of symptoms March '14. EKG with NSR   ? Parkinson's disease   ? concerns given positive DaTScan  ? ? ?Past Medical History, Surgical history, Social history, and Family history were reviewed and updated as appropriate.  ? ?Please see review of systems for further details on the patient's review from today.  ? ?Objective:  ? ?Physical Exam:  ?There were no vitals taken for this visit. ? ?Physical Exam ?Constitutional:   ?   General: She is not in acute distress. ?Musculoskeletal:     ?   General: No deformity.  ?Neurological:  ?   Mental Status: She is alert and oriented to person, place, and time.  ?  Coordination: Coordination normal.  ?Psychiatric:     ?   Attention and Perception: Attention and perception normal. She does not perceive auditory or visual hallucinations.     ?   Mood and Affect: Affect is not labile, blunt, angry or inappropriate.     ?   Speech: Speech normal.     ?   Behavior: Behavior normal.     ?   Thought Content: Thought content normal. Thought content is not paranoid or delusional. Thought content does not include homicidal or suicidal ideation. Thought content does not include homicidal or suicidal plan.     ?   Cognition and Memory: Cognition is impaired. Memory is impaired.     ?   Judgment: Judgment normal.  ?   Comments: Insight fair ?Mood is less depressed and less anxious compared to previous exams.   ? ? ?Lab Review:  ?   ?Component Value Date/Time  ? NA 140 03/28/2022 1118  ? K 4.1 03/28/2022 1118  ? CL 108 03/28/2022 1118  ? CO2 26 03/28/2022 1118  ? GLUCOSE 93 03/28/2022 1118  ? BUN 12 03/28/2022 1118  ? CREATININE 0.95 03/28/2022 1118  ? CALCIUM 8.9 03/28/2022 1118  ? PROT 6.0 (L) 03/28/2022 1118  ? ALBUMIN 3.6 03/28/2022 1118  ? AST 18 03/28/2022  1118  ? ALT 21 03/28/2022 1118  ? ALKPHOS 52 03/28/2022 1118  ? BILITOT 0.9 03/28/2022 1118  ? GFRNONAA 59 (L) 03/28/2022 1118  ? GFRAA >60 07/25/2020 1139  ? ? ?   ?Component Value Date/Time  ? WBC 5.0 03/29/19

## 2022-04-04 DIAGNOSIS — R278 Other lack of coordination: Secondary | ICD-10-CM | POA: Diagnosis not present

## 2022-04-04 DIAGNOSIS — R2681 Unsteadiness on feet: Secondary | ICD-10-CM | POA: Diagnosis not present

## 2022-04-04 DIAGNOSIS — M6281 Muscle weakness (generalized): Secondary | ICD-10-CM | POA: Diagnosis not present

## 2022-04-04 DIAGNOSIS — G20 Parkinson's disease: Secondary | ICD-10-CM | POA: Diagnosis not present

## 2022-04-06 DIAGNOSIS — M6281 Muscle weakness (generalized): Secondary | ICD-10-CM | POA: Diagnosis not present

## 2022-04-06 DIAGNOSIS — R278 Other lack of coordination: Secondary | ICD-10-CM | POA: Diagnosis not present

## 2022-04-06 DIAGNOSIS — R2681 Unsteadiness on feet: Secondary | ICD-10-CM | POA: Diagnosis not present

## 2022-04-06 DIAGNOSIS — G20 Parkinson's disease: Secondary | ICD-10-CM | POA: Diagnosis not present

## 2022-04-06 DIAGNOSIS — Z20822 Contact with and (suspected) exposure to covid-19: Secondary | ICD-10-CM | POA: Diagnosis not present

## 2022-04-07 ENCOUNTER — Encounter: Payer: Self-pay | Admitting: Neurology

## 2022-04-07 DIAGNOSIS — G20 Parkinson's disease: Secondary | ICD-10-CM | POA: Diagnosis not present

## 2022-04-07 DIAGNOSIS — R278 Other lack of coordination: Secondary | ICD-10-CM | POA: Diagnosis not present

## 2022-04-07 DIAGNOSIS — R2681 Unsteadiness on feet: Secondary | ICD-10-CM | POA: Diagnosis not present

## 2022-04-07 DIAGNOSIS — M6281 Muscle weakness (generalized): Secondary | ICD-10-CM | POA: Diagnosis not present

## 2022-04-11 DIAGNOSIS — R278 Other lack of coordination: Secondary | ICD-10-CM | POA: Diagnosis not present

## 2022-04-11 DIAGNOSIS — R2681 Unsteadiness on feet: Secondary | ICD-10-CM | POA: Diagnosis not present

## 2022-04-11 DIAGNOSIS — G20 Parkinson's disease: Secondary | ICD-10-CM | POA: Diagnosis not present

## 2022-04-11 DIAGNOSIS — M6281 Muscle weakness (generalized): Secondary | ICD-10-CM | POA: Diagnosis not present

## 2022-04-11 MED ORDER — RIVASTIGMINE TARTRATE 1.5 MG PO CAPS: 1.5000 mg | ORAL_CAPSULE | Freq: Two times a day (BID) | ORAL | 3 refills | Status: DC

## 2022-04-13 DIAGNOSIS — R278 Other lack of coordination: Secondary | ICD-10-CM | POA: Diagnosis not present

## 2022-04-13 DIAGNOSIS — M6281 Muscle weakness (generalized): Secondary | ICD-10-CM | POA: Diagnosis not present

## 2022-04-13 DIAGNOSIS — G20 Parkinson's disease: Secondary | ICD-10-CM | POA: Diagnosis not present

## 2022-04-13 DIAGNOSIS — R2681 Unsteadiness on feet: Secondary | ICD-10-CM | POA: Diagnosis not present

## 2022-04-14 DIAGNOSIS — R278 Other lack of coordination: Secondary | ICD-10-CM | POA: Diagnosis not present

## 2022-04-14 DIAGNOSIS — M6281 Muscle weakness (generalized): Secondary | ICD-10-CM | POA: Diagnosis not present

## 2022-04-14 DIAGNOSIS — G20 Parkinson's disease: Secondary | ICD-10-CM | POA: Diagnosis not present

## 2022-04-14 DIAGNOSIS — R2681 Unsteadiness on feet: Secondary | ICD-10-CM | POA: Diagnosis not present

## 2022-04-15 ENCOUNTER — Ambulatory Visit (INDEPENDENT_AMBULATORY_CARE_PROVIDER_SITE_OTHER): Payer: Medicare Other | Admitting: Family Medicine

## 2022-04-15 ENCOUNTER — Encounter: Payer: Self-pay | Admitting: Family Medicine

## 2022-04-15 VITALS — BP 98/58 | HR 62 | Temp 97.6°F | Ht 63.0 in | Wt 123.5 lb

## 2022-04-15 DIAGNOSIS — G20 Parkinson's disease: Secondary | ICD-10-CM

## 2022-04-15 DIAGNOSIS — G20A1 Parkinson's disease without dyskinesia, without mention of fluctuations: Secondary | ICD-10-CM

## 2022-04-15 DIAGNOSIS — I959 Hypotension, unspecified: Secondary | ICD-10-CM

## 2022-04-15 DIAGNOSIS — R001 Bradycardia, unspecified: Secondary | ICD-10-CM

## 2022-04-15 DIAGNOSIS — I10 Essential (primary) hypertension: Secondary | ICD-10-CM

## 2022-04-15 NOTE — Progress Notes (Signed)
?[name] ?DOB: 1936/04/28 ?Encounter date: 04/15/2022 ? ?This is a 86 y.o. female who presents with ?Chief Complaint  ?Patient presents with  ? Hospitalization Follow-up  ? ? ?History of present illness: ?Patient admitted 02/26/2022 and discharged 03/01/2022.  She presented with an episode of unresponsiveness that lasted about 20 minutes.  She was found by her daughter at her assisted living facility unresponsive.  She was found to be bradycardic in the 40s.  MRI brain revealed no acute CVA, chest x-ray stable, no seizure activity noted on EEG.  Neurology and cardiology were consulted.  Acute toxic encephalopathy was attributed to Seroquel that she received for severe agitated delirium.  Her bradycardia was thought to be sequelae of autonomic dysfunction secondary to Parkinson's disease. ? ?After hospital went to rehab at [facility]. Didn't meet needs for rehab. She was being given too much carbidopa/levodopa which carried over to rehab. Got out of rehab end of march, went back to [facility]. Additional care has been added. Tripped in April and hit head, got stitches. Then 4/17 had another visit. She had been hard to get up and moving in the morning and cna was concerned. ? ?Daughter here with her today.  ? ?She did come off rivastigmine back in march after ER incident. But things have been worse since then. Increased hallucinations, more difficulty with getting her moving in the morning. Just consulted with Dr. Delice Lesch again and they are resuming the rivastigmine starting back at lower dose.  ? ?Allergies  ?Allergen Reactions  ? Aricept [Donepezil Hcl]   ?  Hallucinations, confusion  ? Cephalexin   ?  REACTION: diaphoretic and drop in Blood pressure  ? Lorazepam Other (See Comments)  ?  amnesia  ? Other   ?  Sedatives cause the patient to be "out of it" per patient's daughter  ? Seroquel [Quetiapine]   ?  Slept 16 hours. Appeared to be responding to internal stimuli. Acute confusion.   ? ?Current Meds  ?Medication  Sig  ? Multiple Vitamins-Minerals (MULTIVITAMIN GUMMIES ADULT PO) Take by mouth daily.  ? ? ?Review of Systems  ?Constitutional:  Negative for chills, fatigue and fever.  ?Respiratory:  Negative for cough, chest tightness, shortness of breath and wheezing.   ?Cardiovascular:  Negative for chest pain, palpitations and leg swelling.  ? ?Objective: ? ?BP (!) 98/58 (BP Location: Left Arm, Patient Position: Sitting, Cuff Size: Normal)   Pulse 62   Temp 97.6 ?F (36.4 ?C) (Oral)   Ht '5\' 3"'$  (1.6 m)   Wt 123 lb 8 oz (56 kg)   SpO2 97%   BMI 21.88 kg/m?   Weight: 123 lb 8 oz (56 kg)  ? ?BP Readings from Last 3 Encounters:  ?04/15/22 (!) 98/58  ?03/28/22 (!) 180/94  ?03/22/22 (!) 185/79  ? ?Wt Readings from Last 3 Encounters:  ?04/15/22 123 lb 8 oz (56 kg)  ?02/27/22 119 lb 3.2 oz (54.1 kg)  ?02/11/22 117 lb 9.6 oz (53.3 kg)  ? ? ?Physical Exam ?Constitutional:   ?   General: She is not in acute distress. ?   Appearance: She is well-developed.  ?Cardiovascular:  ?   Rate and Rhythm: Normal rate and regular rhythm.  ?   Heart sounds: Normal heart sounds. No murmur heard. ?  No friction rub.  ?Pulmonary:  ?   Effort: Pulmonary effort is normal. No respiratory distress.  ?   Breath sounds: Normal breath sounds. No wheezing or rales.  ?Musculoskeletal:  ?   Right lower  leg: No edema.  ?   Left lower leg: No edema.  ?Neurological:  ?   Mental Status: She is alert and oriented to person, place, and time.  ?Psychiatric:     ?   Mood and Affect: Affect is flat.     ?   Speech: Speech is delayed.     ?   Behavior: Behavior normal.  ? ? ?Assessment/Plan ?1. hypotension ?Not on medications; pressures have been running low. Continue to monitor, continue with good hydration. Continue with frequent check ins at living facility and working on taking time with positoin changes. ? ?3. Parkinson's disease ([place]) ?She is following with neuro. Has just restarted her rivastigmine as daughters felt that symptoms were improved with regular use.  Daughters are all very involved in her care and monitoring. They may be moving her to a different living facility for more intensivecare.  ? ? ?Return for call back to set up establish care visit with Dr. Legrand Como. ? ? ? ? ? ?Micheline Rough, MD ?

## 2022-04-15 NOTE — Patient Instructions (Signed)
Call back in august to set up a establish care visit around November with Dr. Legrand Como.  ?

## 2022-04-18 DIAGNOSIS — R2681 Unsteadiness on feet: Secondary | ICD-10-CM | POA: Diagnosis not present

## 2022-04-18 DIAGNOSIS — R278 Other lack of coordination: Secondary | ICD-10-CM | POA: Diagnosis not present

## 2022-04-18 DIAGNOSIS — G20 Parkinson's disease: Secondary | ICD-10-CM | POA: Diagnosis not present

## 2022-04-18 DIAGNOSIS — M6281 Muscle weakness (generalized): Secondary | ICD-10-CM | POA: Diagnosis not present

## 2022-04-21 DIAGNOSIS — R278 Other lack of coordination: Secondary | ICD-10-CM | POA: Diagnosis not present

## 2022-04-21 DIAGNOSIS — M6281 Muscle weakness (generalized): Secondary | ICD-10-CM | POA: Diagnosis not present

## 2022-04-21 DIAGNOSIS — G20 Parkinson's disease: Secondary | ICD-10-CM | POA: Diagnosis not present

## 2022-04-21 DIAGNOSIS — R2681 Unsteadiness on feet: Secondary | ICD-10-CM | POA: Diagnosis not present

## 2022-04-25 ENCOUNTER — Telehealth: Payer: Self-pay

## 2022-04-25 DIAGNOSIS — R278 Other lack of coordination: Secondary | ICD-10-CM | POA: Diagnosis not present

## 2022-04-25 DIAGNOSIS — R2681 Unsteadiness on feet: Secondary | ICD-10-CM | POA: Diagnosis not present

## 2022-04-25 DIAGNOSIS — G20 Parkinson's disease: Secondary | ICD-10-CM | POA: Diagnosis not present

## 2022-04-25 DIAGNOSIS — M6281 Muscle weakness (generalized): Secondary | ICD-10-CM | POA: Diagnosis not present

## 2022-04-25 MED ORDER — RIVASTIGMINE 4.6 MG/24HR TD PT24: 4.6000 mg | MEDICATED_PATCH | Freq: Every day | TRANSDERMAL | 11 refills | Status: DC

## 2022-04-25 NOTE — Telephone Encounter (Signed)
Called [name] and left a message for a call back.  ?

## 2022-04-25 NOTE — Addendum Note (Signed)
Addended by: Cameron Sprang on: 04/25/2022 11:21 AM ? ? Modules accepted: Orders ? ?

## 2022-04-28 ENCOUNTER — Telehealth: Payer: Self-pay

## 2022-04-28 DIAGNOSIS — R278 Other lack of coordination: Secondary | ICD-10-CM | POA: Diagnosis not present

## 2022-04-28 DIAGNOSIS — R2681 Unsteadiness on feet: Secondary | ICD-10-CM | POA: Diagnosis not present

## 2022-04-28 DIAGNOSIS — H401233 Low-tension glaucoma, bilateral, severe stage: Secondary | ICD-10-CM | POA: Diagnosis not present

## 2022-04-28 DIAGNOSIS — G20 Parkinson's disease: Secondary | ICD-10-CM | POA: Diagnosis not present

## 2022-04-28 DIAGNOSIS — M6281 Muscle weakness (generalized): Secondary | ICD-10-CM | POA: Diagnosis not present

## 2022-04-28 NOTE — Telephone Encounter (Signed)
Nursing home was called and new orders  1. Stop rivastigmine capsules  2. Start Rivastigmine patch 4.'6mg'$ /24 hr, apply one patch every 24 hours.  Was faxed to [phone]

## 2022-04-28 NOTE — Telephone Encounter (Signed)
Orders faxed to [phone]

## 2022-05-02 DIAGNOSIS — M6281 Muscle weakness (generalized): Secondary | ICD-10-CM | POA: Diagnosis not present

## 2022-05-02 DIAGNOSIS — G20 Parkinson's disease: Secondary | ICD-10-CM | POA: Diagnosis not present

## 2022-05-02 DIAGNOSIS — R2681 Unsteadiness on feet: Secondary | ICD-10-CM | POA: Diagnosis not present

## 2022-05-02 DIAGNOSIS — R278 Other lack of coordination: Secondary | ICD-10-CM | POA: Diagnosis not present

## 2022-05-04 DIAGNOSIS — R278 Other lack of coordination: Secondary | ICD-10-CM | POA: Diagnosis not present

## 2022-05-04 DIAGNOSIS — R2681 Unsteadiness on feet: Secondary | ICD-10-CM | POA: Diagnosis not present

## 2022-05-04 DIAGNOSIS — G20 Parkinson's disease: Secondary | ICD-10-CM | POA: Diagnosis not present

## 2022-05-04 DIAGNOSIS — M6281 Muscle weakness (generalized): Secondary | ICD-10-CM | POA: Diagnosis not present

## 2022-05-05 DIAGNOSIS — M6281 Muscle weakness (generalized): Secondary | ICD-10-CM | POA: Diagnosis not present

## 2022-05-05 DIAGNOSIS — G20 Parkinson's disease: Secondary | ICD-10-CM | POA: Diagnosis not present

## 2022-05-05 DIAGNOSIS — R2681 Unsteadiness on feet: Secondary | ICD-10-CM | POA: Diagnosis not present

## 2022-05-05 DIAGNOSIS — R278 Other lack of coordination: Secondary | ICD-10-CM | POA: Diagnosis not present

## 2022-05-10 DIAGNOSIS — M6281 Muscle weakness (generalized): Secondary | ICD-10-CM | POA: Diagnosis not present

## 2022-05-10 DIAGNOSIS — R2681 Unsteadiness on feet: Secondary | ICD-10-CM | POA: Diagnosis not present

## 2022-05-10 DIAGNOSIS — R278 Other lack of coordination: Secondary | ICD-10-CM | POA: Diagnosis not present

## 2022-05-10 DIAGNOSIS — G20 Parkinson's disease: Secondary | ICD-10-CM | POA: Diagnosis not present

## 2022-05-11 DIAGNOSIS — G20 Parkinson's disease: Secondary | ICD-10-CM | POA: Diagnosis not present

## 2022-05-11 DIAGNOSIS — R278 Other lack of coordination: Secondary | ICD-10-CM | POA: Diagnosis not present

## 2022-05-11 DIAGNOSIS — R2681 Unsteadiness on feet: Secondary | ICD-10-CM | POA: Diagnosis not present

## 2022-05-11 DIAGNOSIS — M6281 Muscle weakness (generalized): Secondary | ICD-10-CM | POA: Diagnosis not present

## 2022-05-16 DIAGNOSIS — R278 Other lack of coordination: Secondary | ICD-10-CM | POA: Diagnosis not present

## 2022-05-16 DIAGNOSIS — M6281 Muscle weakness (generalized): Secondary | ICD-10-CM | POA: Diagnosis not present

## 2022-05-16 DIAGNOSIS — G20 Parkinson's disease: Secondary | ICD-10-CM | POA: Diagnosis not present

## 2022-05-16 DIAGNOSIS — R2681 Unsteadiness on feet: Secondary | ICD-10-CM | POA: Diagnosis not present

## 2022-05-18 DIAGNOSIS — M6281 Muscle weakness (generalized): Secondary | ICD-10-CM | POA: Diagnosis not present

## 2022-05-18 DIAGNOSIS — G20 Parkinson's disease: Secondary | ICD-10-CM | POA: Diagnosis not present

## 2022-05-18 DIAGNOSIS — R2681 Unsteadiness on feet: Secondary | ICD-10-CM | POA: Diagnosis not present

## 2022-05-18 DIAGNOSIS — R278 Other lack of coordination: Secondary | ICD-10-CM | POA: Diagnosis not present

## 2022-05-23 DIAGNOSIS — R278 Other lack of coordination: Secondary | ICD-10-CM | POA: Diagnosis not present

## 2022-05-23 DIAGNOSIS — M6281 Muscle weakness (generalized): Secondary | ICD-10-CM | POA: Diagnosis not present

## 2022-05-23 DIAGNOSIS — R2681 Unsteadiness on feet: Secondary | ICD-10-CM | POA: Diagnosis not present

## 2022-05-23 DIAGNOSIS — G20 Parkinson's disease: Secondary | ICD-10-CM | POA: Diagnosis not present

## 2022-05-25 DIAGNOSIS — Z111 Encounter for screening for respiratory tuberculosis: Secondary | ICD-10-CM | POA: Diagnosis not present

## 2022-05-27 DIAGNOSIS — R278 Other lack of coordination: Secondary | ICD-10-CM | POA: Diagnosis not present

## 2022-05-27 DIAGNOSIS — M6281 Muscle weakness (generalized): Secondary | ICD-10-CM | POA: Diagnosis not present

## 2022-05-27 DIAGNOSIS — G20 Parkinson's disease: Secondary | ICD-10-CM | POA: Diagnosis not present

## 2022-05-27 DIAGNOSIS — R2681 Unsteadiness on feet: Secondary | ICD-10-CM | POA: Diagnosis not present

## 2022-05-29 DIAGNOSIS — Z111 Encounter for screening for respiratory tuberculosis: Secondary | ICD-10-CM | POA: Diagnosis not present

## 2022-05-30 DIAGNOSIS — G20 Parkinson's disease: Secondary | ICD-10-CM | POA: Diagnosis not present

## 2022-05-30 DIAGNOSIS — M6281 Muscle weakness (generalized): Secondary | ICD-10-CM | POA: Diagnosis not present

## 2022-05-30 DIAGNOSIS — R2681 Unsteadiness on feet: Secondary | ICD-10-CM | POA: Diagnosis not present

## 2022-05-30 DIAGNOSIS — R278 Other lack of coordination: Secondary | ICD-10-CM | POA: Diagnosis not present

## 2022-05-31 ENCOUNTER — Other Ambulatory Visit: Payer: Self-pay | Admitting: Neurology

## 2022-06-01 ENCOUNTER — Other Ambulatory Visit: Payer: Self-pay

## 2022-06-01 ENCOUNTER — Ambulatory Visit: Payer: Medicare Other

## 2022-06-03 ENCOUNTER — Ambulatory Visit (INDEPENDENT_AMBULATORY_CARE_PROVIDER_SITE_OTHER): Payer: Medicare Other | Admitting: Family

## 2022-06-03 VITALS — BP 110/68 | HR 68 | Temp 97.4°F | Wt 119.8 lb

## 2022-06-03 DIAGNOSIS — F0390 Unspecified dementia without behavioral disturbance: Secondary | ICD-10-CM | POA: Diagnosis not present

## 2022-06-03 DIAGNOSIS — F02C11 Dementia in other diseases classified elsewhere, severe, with agitation: Secondary | ICD-10-CM

## 2022-06-03 DIAGNOSIS — G309 Alzheimer's disease, unspecified: Secondary | ICD-10-CM

## 2022-06-03 NOTE — Progress Notes (Signed)
Established Patient Office Visit  Subjective   Patient ID: [name], female    DOB: 1936/10/08  Age: 86 y.o. MRN: [id number]  Chief Complaint  Patient presents with   OTHER    Pt is here for paperwork to be filled out to sent back to assistant living home.     HPI  Patient  presents for [facility] paperwork completion. She is accompanied by her daughter. Plans to go into memory care facility.     Patient Active Problem List   Diagnosis Date Noted   Syncope 02/27/2022   DNR (do not resuscitate)/DNI(Do Not Intubate) 02/26/2022   Altered mental status 02/26/2022   Parkinson's disease (HCC) 02/26/2022   Alzheimer's dementia (HCC) 02/26/2022   Sinus bradycardia 02/26/2022   Menopausal symptom 11/18/2019   Major neurocognitive disorder 01/27/2019   Major depressive disorder 03/18/2016   Hot flashes 04/03/2014   Generalized anxiety disorder 02/19/2013   Hypertension 05/24/2011   Hyperlipemia 02/07/2008   Allergic rhinitis 02/07/2008      Review of Systems  Psychiatric/Behavioral:  Positive for memory loss.   All other systems reviewed and are negative.     Objective:     BP 110/68 (BP Location: Right Arm, Patient Position: Sitting, Cuff Size: Normal)   Pulse 68   Temp (!) 97.4 F (36.3 C) (Oral)   Wt 119 lb 12.8 oz (54.3 kg)   SpO2 98%   BMI 21.22 kg/m  BP Readings from Last 3 Encounters:  06/03/22 110/68  04/15/22 (!) 98/58  03/28/22 (!) 180/94      Physical Exam Vitals and nursing note reviewed.  Constitutional:      Appearance: Normal appearance.  Eyes:     Extraocular Movements: Extraocular movements intact.     Pupils: Pupils are equal, round, and reactive to light.  Cardiovascular:     Rate and Rhythm: Normal rate and regular rhythm.  Pulmonary:     Effort: Pulmonary effort is normal.     Breath sounds: Normal breath sounds.  Abdominal:     General: Abdomen is flat.     Palpations: Abdomen is soft.  Musculoskeletal:        General: Normal range  of motion.     Cervical back: Normal range of motion and neck supple.  Skin:    General: Skin is warm and dry.  Neurological:     General: No focal deficit present.     Mental Status: She is alert and oriented to person, place, and time.  Psychiatric:        Mood and Affect: Mood normal.        Behavior: Behavior normal.      No results found for any visits on 06/03/22.  Last metabolic panel Lab Results  Component Value Date   GLUCOSE 93 03/28/2022   NA 140 03/28/2022   K 4.1 03/28/2022   CL 108 03/28/2022   CO2 26 03/28/2022   BUN 12 03/28/2022   CREATININE 0.95 03/28/2022   GFRNONAA 59 (L) 03/28/2022   CALCIUM 8.9 03/28/2022   PROT 6.0 (L) 03/28/2022   ALBUMIN 3.6 03/28/2022   BILITOT 0.9 03/28/2022   ALKPHOS 52 03/28/2022   AST 18 03/28/2022   ALT 21 03/28/2022   ANIONGAP 6 03/28/2022   Last thyroid functions Lab Results  Component Value Date   TSH 2.11 12/22/2021   Last vitamin B12 and Folate Lab Results  Component Value Date   VITAMINB12 445 12/22/2021      The ASCVD Risk  score (Arnett DK, et al., 2019) failed to calculate for the following reasons:   The 2019 ASCVD risk score is only valid for ages 70 to 14    Assessment & Plan:   Problem List Items Addressed This Visit     Major neurocognitive disorder - Primary   Alzheimer's dementia (HCC)    No follow-ups on file.    Eulis Foster, FNP

## 2022-06-07 ENCOUNTER — Telehealth: Payer: Self-pay | Admitting: Family Medicine

## 2022-06-07 ENCOUNTER — Telehealth: Payer: Self-pay | Admitting: Physician Assistant

## 2022-06-07 NOTE — Telephone Encounter (Signed)
Completed and signed and faxed

## 2022-06-07 NOTE — Telephone Encounter (Signed)
Patient's daughter stopped by because they are in need of the [facility] paperwork to have patient moved to memory care. I checked the file cabinet and the chart, but did not see anything. I asked Aggie Cosier if someone had called to let them know that it was ready for pickup and she stated that her sister had called and told her that they need the paperwork today in order for patient to be moved and someone from our office had told the sister to "stop by and get it." I let her know that if a message was not left in the chart and it was not in the cabinet, then it was probably not completed yet. Aggie Cosier asked if Oran Rein could finish it tomorrow and have it faxed over to [place] [name]. I let her know that for paperwork to be completed, it is five to seven business days. Aggie Cosier would like a call when completed and for it to be faxed over as well.     Please advise

## 2022-06-09 DIAGNOSIS — I10 Essential (primary) hypertension: Secondary | ICD-10-CM | POA: Diagnosis not present

## 2022-06-09 DIAGNOSIS — F0393 Unspecified dementia, unspecified severity, with mood disturbance: Secondary | ICD-10-CM | POA: Diagnosis not present

## 2022-06-09 DIAGNOSIS — G309 Alzheimer's disease, unspecified: Secondary | ICD-10-CM | POA: Diagnosis not present

## 2022-06-09 DIAGNOSIS — G20 Parkinson's disease: Secondary | ICD-10-CM | POA: Diagnosis not present

## 2022-06-09 DIAGNOSIS — F02818 Dementia in other diseases classified elsewhere, unspecified severity, with other behavioral disturbance: Secondary | ICD-10-CM | POA: Diagnosis not present

## 2022-06-09 DIAGNOSIS — H409 Unspecified glaucoma: Secondary | ICD-10-CM | POA: Diagnosis not present

## 2022-06-09 DIAGNOSIS — E785 Hyperlipidemia, unspecified: Secondary | ICD-10-CM | POA: Diagnosis not present

## 2022-06-09 DIAGNOSIS — R001 Bradycardia, unspecified: Secondary | ICD-10-CM | POA: Diagnosis not present

## 2022-06-17 ENCOUNTER — Other Ambulatory Visit: Payer: Self-pay | Admitting: Neurology

## 2022-06-17 DIAGNOSIS — G309 Alzheimer's disease, unspecified: Secondary | ICD-10-CM | POA: Diagnosis not present

## 2022-06-17 DIAGNOSIS — F411 Generalized anxiety disorder: Secondary | ICD-10-CM

## 2022-06-17 DIAGNOSIS — Z79899 Other long term (current) drug therapy: Secondary | ICD-10-CM | POA: Diagnosis not present

## 2022-06-17 DIAGNOSIS — Z87891 Personal history of nicotine dependence: Secondary | ICD-10-CM | POA: Diagnosis not present

## 2022-06-17 DIAGNOSIS — F330 Major depressive disorder, recurrent, mild: Secondary | ICD-10-CM

## 2022-06-17 DIAGNOSIS — G20 Parkinson's disease: Secondary | ICD-10-CM | POA: Diagnosis not present

## 2022-06-17 DIAGNOSIS — I10 Essential (primary) hypertension: Secondary | ICD-10-CM | POA: Diagnosis not present

## 2022-06-17 DIAGNOSIS — F02818 Dementia in other diseases classified elsewhere, unspecified severity, with other behavioral disturbance: Secondary | ICD-10-CM | POA: Diagnosis not present

## 2022-06-17 DIAGNOSIS — F0283 Dementia in other diseases classified elsewhere, unspecified severity, with mood disturbance: Secondary | ICD-10-CM | POA: Diagnosis not present

## 2022-06-17 DIAGNOSIS — H409 Unspecified glaucoma: Secondary | ICD-10-CM | POA: Diagnosis not present

## 2022-06-17 DIAGNOSIS — Z9181 History of falling: Secondary | ICD-10-CM | POA: Diagnosis not present

## 2022-06-17 DIAGNOSIS — E785 Hyperlipidemia, unspecified: Secondary | ICD-10-CM | POA: Diagnosis not present

## 2022-06-20 DIAGNOSIS — F02818 Dementia in other diseases classified elsewhere, unspecified severity, with other behavioral disturbance: Secondary | ICD-10-CM | POA: Diagnosis not present

## 2022-06-20 DIAGNOSIS — G20 Parkinson's disease: Secondary | ICD-10-CM | POA: Diagnosis not present

## 2022-06-20 DIAGNOSIS — G309 Alzheimer's disease, unspecified: Secondary | ICD-10-CM | POA: Diagnosis not present

## 2022-06-20 DIAGNOSIS — I10 Essential (primary) hypertension: Secondary | ICD-10-CM | POA: Diagnosis not present

## 2022-06-20 DIAGNOSIS — F411 Generalized anxiety disorder: Secondary | ICD-10-CM | POA: Diagnosis not present

## 2022-06-20 DIAGNOSIS — F0283 Dementia in other diseases classified elsewhere, unspecified severity, with mood disturbance: Secondary | ICD-10-CM | POA: Diagnosis not present

## 2022-06-21 DIAGNOSIS — I10 Essential (primary) hypertension: Secondary | ICD-10-CM | POA: Diagnosis not present

## 2022-06-21 DIAGNOSIS — F411 Generalized anxiety disorder: Secondary | ICD-10-CM | POA: Diagnosis not present

## 2022-06-21 DIAGNOSIS — F02818 Dementia in other diseases classified elsewhere, unspecified severity, with other behavioral disturbance: Secondary | ICD-10-CM | POA: Diagnosis not present

## 2022-06-21 DIAGNOSIS — G20 Parkinson's disease: Secondary | ICD-10-CM | POA: Diagnosis not present

## 2022-06-21 DIAGNOSIS — F0283 Dementia in other diseases classified elsewhere, unspecified severity, with mood disturbance: Secondary | ICD-10-CM | POA: Diagnosis not present

## 2022-06-21 DIAGNOSIS — G309 Alzheimer's disease, unspecified: Secondary | ICD-10-CM | POA: Diagnosis not present

## 2022-06-22 ENCOUNTER — Encounter: Payer: Self-pay | Admitting: Psychiatry

## 2022-06-22 ENCOUNTER — Telehealth (INDEPENDENT_AMBULATORY_CARE_PROVIDER_SITE_OTHER): Payer: Medicare Other | Admitting: Psychiatry

## 2022-06-22 DIAGNOSIS — F411 Generalized anxiety disorder: Secondary | ICD-10-CM | POA: Diagnosis not present

## 2022-06-22 DIAGNOSIS — F03918 Unspecified dementia, unspecified severity, with other behavioral disturbance: Secondary | ICD-10-CM

## 2022-06-22 NOTE — Progress Notes (Signed)
[name] [id number] [birth year] 86 y.o.  Virtual Visit via Video Note  I connected with pt @ on 06/22/22 at 12:45 PM EDT by a video enabled telemedicine application and verified that I am speaking with the correct person using two identifiers.   I discussed the limitations of evaluation and management by telemedicine and the availability of in person appointments. The patient expressed understanding and agreed to proceed.  I discussed the assessment and treatment plan with the patient. The patient was provided an opportunity to ask questions and all were answered. The patient agreed with the plan and demonstrated an understanding of the instructions.   The patient was advised to call back or seek an in-person evaluation if the symptoms worsen or if the condition fails to improve as anticipated.  I provided 30 minutes of non-face-to-face time during this encounter.  The patient was located at [place] [facility] facility. The provider was located at [place].   Thayer Headings, PMHNP   Subjective:   Patient ID:  [name] is a 86 y.o. (DOB [birth year]) female.  Chief Complaint:  Chief Complaint  Patient presents with   Memory Loss   Other    Recent sundowning and wandering   Follow-up    Anxiety, depression    HPI [name] presents for follow-up of anxiety, depression, sleep disturbance, and psychosis. She is accompanied  by daughter, [name]. Daughter reports that she has noticed a "dramatic decline" with pt in the last 2-3 weeks. She is now stooped over and having difficulty sitting down unassisted.   She has been moved to [facility] care facility  2 weeks ago after a wandering incident where she left her previous facility and was found a mile down the road and was brought back by police.  Daughter reports that patient will receive in-house psychiatric evaluation in the near future, possibly this afternoon.  Daughter reports that the patient may receive  pharmacogenetic testing as well.  She reports that there has been some discussion about possibly starting Nuplazid.  Daughter reports that pt has had difficulty adjusting to move. Daughter reports that she has had some sundowning and agitation. Daughter reports that her sleep has been disrupted due to change in facility. Daughter reports that pt is often pacing and walking the halls. Daughter reports "good days and bad days" in terms of depression. Pt reports that she is "sometimes" feeling depressed. Daughter reports that she seems to have increased anxiety in the evening that contributes to her sundowning. Daughter reports that she received a call from the director recently at 5 pm to tell her that pt was being combative. Daughter reports that pt seemed to be paranoid at that time and said she did not believe the director saying that she was talking to her doctor. Daughter has observed pt respond to internal stimuli. Daughter reports that pt is having conversations with people that are not present. Daughter reports that pt's appetite may be decreased. Pt reports difficult with focus.   Daughter denies any evidence of suicidal ideation.   On waiting list for the [facility] memory care at [facility].   Has started Speech Therapy, OT, and PT.  Sertaline Paxil Lexapro- Started in 2017 and took for about a year Prozac- Took after Lexapro and took for about a year Aricept- Adverse reaction Exelon Ativan- Adverse reaction Seroquel- Adverse reaction. Slept for 16 hours. Appeared to be responding to internal stimuli. Acute confusion.   Review of Systems:  Review of Systems  Constitutional:        Daughter reports that she is concerned that pt may be dehydrated and they are encouraging her to drink.   Musculoskeletal:  Negative for gait problem.  Neurological:        Stooped posture  Psychiatric/Behavioral:         Please refer to HPI    Medications: I have reviewed the patient's current  medications.  Current Outpatient Medications  Medication Sig Dispense Refill   buPROPion ER (WELLBUTRIN SR) 100 MG 12 hr tablet TAKE ONE TABLET BY MOUTH EVERY MORNING FOR MOOD 30 tablet 0   Calcium Carbonate-Vitamin D (CALTRATE 600+D PO) Take 1 each by mouth daily. gummie     carbidopa-levodopa (SINEMET IR) 25-100 MG tablet TAKE 1 TABLET BY MOUTH THREE TIMES A DAY WITH MEALS 90 tablet 2   dorzolamide-timolol (COSOPT) 22.3-6.8 MG/ML ophthalmic solution Place 1 drop into both eyes 2 (two) times daily.     fluticasone (FLONASE) 50 MCG/ACT nasal spray Place 1 spray into both nostrils in the morning, at noon, and at bedtime.     Melatonin 1 MG CHEW Chew 3 mg by mouth at bedtime as needed (sleep). Taking at 5pm and 7pm     Multiple Vitamins-Minerals (MULTIVITAMIN GUMMIES ADULT PO) Take by mouth daily.     psyllium (METAMUCIL SMOOTH TEXTURE) 58.6 % powder Take 1 packet by mouth daily. (Patient taking differently: Take 1 packet by mouth as needed (constipation).) 283 g 12   rivastigmine (EXELON) 4.6 mg/24hr Place 1 patch (4.6 mg total) onto the skin daily. 30 patch 11   sertraline (ZOLOFT) 50 MG tablet TAKE ONE TABLET BY MOUTH DAILY FOR ANXIETY 30 tablet 0   No current facility-administered medications for this visit.    Medication Side Effects: None  Allergies:  Allergies  Allergen Reactions   Aricept [Donepezil Hcl]     Hallucinations, confusion   Cephalexin     REACTION: diaphoretic and drop in Blood pressure   Lorazepam Other (See Comments)    amnesia   Other     Sedatives cause the patient to be "out of it" per patient's daughter   Seroquel [Quetiapine]     Slept 16 hours. Appeared to be responding to internal stimuli. Acute confusion.     Past Medical History:  Diagnosis Date   Allergic rhinitis 02/07/2008   Contact dermatitis 03/16/2013   Generalized anxiety disorder 02/19/2013   has seen [name], [name], and now [name] for counseling   Hemorrhoids, external     Hot flashes    Hyperlipemia 02/07/2008   Long standing problem. Pretreatment LDL 175 in 2006, 210 in 2010.  Had tried lipitor but question of rash after 3 days of use. Switched to zocor but reports having leg pain. Retried Lipitor - weakness.  Currently on no medications    Hypertension    Irritable bowel syndrome    Major depressive disorder 03/18/2016   Major neurocognitive disorder 01/27/2019   possible Alzheimer's disease or mixed presentation   Menopausal symptom 11/18/2019   Palpitations 02/19/2013   Onset of symptoms March '14. EKG with NSR    Parkinson's disease    concerns given positive DaTScan    Family History  Problem Relation Age of Onset   Dementia Mother    Depression Mother 50   Heart disease Father    Cancer Sister        breast   Heart disease Other    Heart disease Other    Heart  disease Other    Cancer Sister        breast   Hypothyroidism Sister    Asthma Sister    Rheum arthritis Paternal Grandmother    Rheum arthritis Daughter     Social History   Socioeconomic History   Marital status: Widowed    Spouse name: Not on file   Number of children: 4   Years of education: 16   Highest education level: Bachelor's degree (e.g., BA, AB, BS)  Occupational History   Occupation: Retired    Comment: Freight forwarder at [employer]  Tobacco Use   Smoking status: Former    Packs/day: 0.10    Years: 1.00    Total pack years: 0.10    Types: Cigarettes    Quit date: 12/12/1962    Years since quitting: 59.5   Smokeless tobacco: Never   Tobacco comments:    smoked for 3 months in college   Vaping Use   Vaping Use: Never used  Substance and Sexual Activity   Alcohol use: Not Currently    Comment: rarely   Drug use: No   Sexual activity: Not Currently    Partners: Male  Other Topics Concern   Not on file  Social History Narrative   College grad. Married '61. 4 daughters, 2 grandchildren. 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   Advanced  Directives: Has Living Will, HCPOA: Ledonna Dormer [phone]         [place]       01/22/19: Pt. Lives in [facility], daughters check in on patient, managing medications   Pt. Still drives, manages yardwork she says   Enjoys swim aerobics at the [facility]   Right handed   Social Determinants of Health   Financial Resource Strain: Low Risk  (12/22/2021)   Overall Financial Resource Strain (CARDIA)    Difficulty of Paying Living Expenses: Not hard at all  Food Insecurity: No Food Insecurity (12/22/2021)   Hunger Vital Sign    Worried About Running Out of Food in the Last Year: Never true    Ran Out of Food in the Last Year: Never true  Transportation Needs: No Transportation Needs (12/22/2021)   PRAPARE - Hydrologist (Medical): No    Lack of Transportation (Non-Medical): No  Physical Activity: Insufficiently Active (12/22/2021)   Exercise Vital Sign    Days of Exercise per Week: 3 days    Minutes of Exercise per Session: 30 min  Stress: No Stress Concern Present (12/22/2021)   [place]    Feeling of Stress : Not at all  Recent Concern: Stress - Stress Concern Present (12/19/2021)   [place]    Feeling of Stress : Very much  Social Connections: Socially Isolated (12/22/2021)   Social Connection and Isolation Panel [NHANES]    Frequency of Communication with Friends and Family: More than three times a week    Frequency of Social Gatherings with Friends and Family: More than three times a week    Attends Religious Services: Never    Marine scientist or Organizations: No    Attends Archivist Meetings: Never    Marital Status: Widowed  Intimate Partner Violence: Not At Risk (12/22/2021)   Humiliation, Afraid, Rape, and Kick questionnaire    Fear of Current or Ex-Partner: No    Emotionally Abused: No     Physically Abused:  No    Sexually Abused: No    Past Medical History, Surgical history, Social history, and Family history were reviewed and updated as appropriate.   Please see review of systems for further details on the patient's review from today.   Objective:   Physical Exam:  There were no vitals taken for this visit.  Physical Exam Constitutional:      General: She is not in acute distress. Musculoskeletal:        General: No deformity.  Neurological:     Mental Status: She is alert.     Coordination: Coordination normal.  Psychiatric:        Attention and Perception: Attention normal.        Mood and Affect: Affect is not labile, blunt, angry or inappropriate.        Behavior: Behavior is slowed.        Thought Content: Thought content does not include homicidal or suicidal ideation. Thought content does not include homicidal or suicidal plan.        Cognition and Memory: Memory is impaired.     Comments: Insight fair. Impaired judgment. Speech is soft and withdrawn Limited eye contact Does not appear to be responding to internal stimuli at time of exam. Daughter reports paranoia and pt appearing to respond to internal stimuli, often in the evening Patient appears drowsy on exam and daughter attributes this to diminished sleep last night.     Lab Review:     Component Value Date/Time   NA 140 03/28/2022 1118   K 4.1 03/28/2022 1118   CL 108 03/28/2022 1118   CO2 26 03/28/2022 1118   GLUCOSE 93 03/28/2022 1118   BUN 12 03/28/2022 1118   CREATININE 0.95 03/28/2022 1118   CALCIUM 8.9 03/28/2022 1118   PROT 6.0 (L) 03/28/2022 1118   ALBUMIN 3.6 03/28/2022 1118   AST 18 03/28/2022 1118   ALT 21 03/28/2022 1118   ALKPHOS 52 03/28/2022 1118   BILITOT 0.9 03/28/2022 1118   GFRNONAA 59 (L) 03/28/2022 1118   GFRAA >60 07/25/2020 1139       Component Value Date/Time   WBC 5.0 03/28/2022 1118   RBC 4.15 03/28/2022 1118   HGB 13.2 03/28/2022 1118   HCT 40.1  03/28/2022 1118   PLT 228 03/28/2022 1118   MCV 96.6 03/28/2022 1118   MCH 31.8 03/28/2022 1118   MCHC 32.9 03/28/2022 1118   RDW 12.5 03/28/2022 1118   LYMPHSABS 1.5 02/26/2022 1532   MONOABS 0.5 02/26/2022 1532   EOSABS 0.1 02/26/2022 1532   BASOSABS 0.1 02/26/2022 1532    No results found for: "POCLITH", "LITHIUM"   No results found for: "PHENYTOIN", "PHENOBARB", "VALPROATE", "CBMZ"   .res Assessment: Plan:    Spent 30 minutes reviewing changes since last visit with patient and daughter and discussing plan.  Discussed that care is typically managed by only 1 psychiatric provider, and it may be helpful to have care manage by in-house psychiatric provider at this time since they are coming to the facility every 2 weeks and would be able to monitor patient closely, as well as discuss behaviors with staff and review their documentation.  Discussed that they are welcome to transition care back to this provider in the future if needed, ie. if patient transitions to another facility, etc. Discussed that this provider would be glad to discuss history with in-house psychiatric provider or release records if requested and information release obtained. Agreed that pharmacogenetic testing may be helpful since  patient has history of adverse reactions with multiple psychiatric medications in the past. In house psychiatric provider with likely be evaluating for any possible medical causes that could have precipitated recent significant worsening in cognition.  Discussed that treatment with Nuplazid may be helpful for psychotic symptoms if it is determined that psychotic symptoms are not due to underlying medical cause. Daughter reports concern about patient stooping over, having difficulty sitting up, and pacing. Discussed that these are Parkinson's symptoms and she may want to contact neurology and/or request earlier follow-up if possible. Daughter reports that sertraline and Wellbutrin seem to continue  to be helpful and well tolerated.  No medication changes recommended at this time. Patient will follow-up if needed.   Maci was seen today for memory loss, other and follow-up.  Diagnoses and all orders for this visit:  Dementia with behavioral disturbance ([place])  Generalized anxiety disorder     Please see After Visit Summary for patient specific instructions.  Future Appointments  Date Time Provider [place]  07/04/2022 11:30 AM Farrel Conners, MD [facility]  08/17/2022 11:30 AM Rondel Jumbo, PA-C [facility] None  12/27/2022  3:15 PM [facility]-NURSE HEALTH ADVISOR [facility]    No orders of the defined types were placed in this encounter.     -------------------------------

## 2022-06-23 DIAGNOSIS — F02818 Dementia in other diseases classified elsewhere, unspecified severity, with other behavioral disturbance: Secondary | ICD-10-CM | POA: Diagnosis not present

## 2022-06-23 DIAGNOSIS — F411 Generalized anxiety disorder: Secondary | ICD-10-CM | POA: Diagnosis not present

## 2022-06-23 DIAGNOSIS — G20 Parkinson's disease: Secondary | ICD-10-CM | POA: Diagnosis not present

## 2022-06-23 DIAGNOSIS — I10 Essential (primary) hypertension: Secondary | ICD-10-CM | POA: Diagnosis not present

## 2022-06-23 DIAGNOSIS — G309 Alzheimer's disease, unspecified: Secondary | ICD-10-CM | POA: Diagnosis not present

## 2022-06-23 DIAGNOSIS — F0283 Dementia in other diseases classified elsewhere, unspecified severity, with mood disturbance: Secondary | ICD-10-CM | POA: Diagnosis not present

## 2022-06-28 DIAGNOSIS — G20 Parkinson's disease: Secondary | ICD-10-CM | POA: Diagnosis not present

## 2022-06-28 DIAGNOSIS — F411 Generalized anxiety disorder: Secondary | ICD-10-CM | POA: Diagnosis not present

## 2022-06-28 DIAGNOSIS — G309 Alzheimer's disease, unspecified: Secondary | ICD-10-CM | POA: Diagnosis not present

## 2022-06-28 DIAGNOSIS — F0283 Dementia in other diseases classified elsewhere, unspecified severity, with mood disturbance: Secondary | ICD-10-CM | POA: Diagnosis not present

## 2022-06-28 DIAGNOSIS — I10 Essential (primary) hypertension: Secondary | ICD-10-CM | POA: Diagnosis not present

## 2022-06-28 DIAGNOSIS — F02818 Dementia in other diseases classified elsewhere, unspecified severity, with other behavioral disturbance: Secondary | ICD-10-CM | POA: Diagnosis not present

## 2022-06-30 ENCOUNTER — Ambulatory Visit: Payer: Medicare Other | Admitting: Psychiatry

## 2022-06-30 DIAGNOSIS — F323 Major depressive disorder, single episode, severe with psychotic features: Secondary | ICD-10-CM | POA: Diagnosis not present

## 2022-07-01 DIAGNOSIS — F411 Generalized anxiety disorder: Secondary | ICD-10-CM | POA: Diagnosis not present

## 2022-07-01 DIAGNOSIS — I10 Essential (primary) hypertension: Secondary | ICD-10-CM | POA: Diagnosis not present

## 2022-07-01 DIAGNOSIS — F0283 Dementia in other diseases classified elsewhere, unspecified severity, with mood disturbance: Secondary | ICD-10-CM | POA: Diagnosis not present

## 2022-07-01 DIAGNOSIS — G309 Alzheimer's disease, unspecified: Secondary | ICD-10-CM | POA: Diagnosis not present

## 2022-07-01 DIAGNOSIS — G20 Parkinson's disease: Secondary | ICD-10-CM | POA: Diagnosis not present

## 2022-07-01 DIAGNOSIS — F02818 Dementia in other diseases classified elsewhere, unspecified severity, with other behavioral disturbance: Secondary | ICD-10-CM | POA: Diagnosis not present

## 2022-07-04 ENCOUNTER — Encounter: Payer: Medicare Other | Admitting: Family Medicine

## 2022-07-22 DIAGNOSIS — G20 Parkinson's disease: Secondary | ICD-10-CM | POA: Diagnosis not present

## 2022-07-22 DIAGNOSIS — M6281 Muscle weakness (generalized): Secondary | ICD-10-CM | POA: Diagnosis not present

## 2022-07-22 DIAGNOSIS — R2689 Other abnormalities of gait and mobility: Secondary | ICD-10-CM | POA: Diagnosis not present

## 2022-07-22 DIAGNOSIS — R2681 Unsteadiness on feet: Secondary | ICD-10-CM | POA: Diagnosis not present

## 2022-07-25 DIAGNOSIS — J309 Allergic rhinitis, unspecified: Secondary | ICD-10-CM | POA: Diagnosis not present

## 2022-07-25 DIAGNOSIS — F02B2 Dementia in other diseases classified elsewhere, moderate, with psychotic disturbance: Secondary | ICD-10-CM | POA: Diagnosis not present

## 2022-07-25 DIAGNOSIS — F5101 Primary insomnia: Secondary | ICD-10-CM | POA: Diagnosis not present

## 2022-07-25 DIAGNOSIS — G20 Parkinson's disease: Secondary | ICD-10-CM | POA: Diagnosis not present

## 2022-07-25 DIAGNOSIS — M6281 Muscle weakness (generalized): Secondary | ICD-10-CM | POA: Diagnosis not present

## 2022-07-25 DIAGNOSIS — Z79899 Other long term (current) drug therapy: Secondary | ICD-10-CM | POA: Diagnosis not present

## 2022-07-25 DIAGNOSIS — F339 Major depressive disorder, recurrent, unspecified: Secondary | ICD-10-CM | POA: Diagnosis not present

## 2022-07-25 DIAGNOSIS — R2681 Unsteadiness on feet: Secondary | ICD-10-CM | POA: Diagnosis not present

## 2022-07-25 DIAGNOSIS — I10 Essential (primary) hypertension: Secondary | ICD-10-CM | POA: Diagnosis not present

## 2022-07-25 DIAGNOSIS — G301 Alzheimer's disease with late onset: Secondary | ICD-10-CM | POA: Diagnosis not present

## 2022-07-25 DIAGNOSIS — R2689 Other abnormalities of gait and mobility: Secondary | ICD-10-CM | POA: Diagnosis not present

## 2022-07-27 DIAGNOSIS — R300 Dysuria: Secondary | ICD-10-CM | POA: Diagnosis not present

## 2022-07-27 DIAGNOSIS — M6281 Muscle weakness (generalized): Secondary | ICD-10-CM | POA: Diagnosis not present

## 2022-07-27 DIAGNOSIS — G20 Parkinson's disease: Secondary | ICD-10-CM | POA: Diagnosis not present

## 2022-07-27 DIAGNOSIS — R2689 Other abnormalities of gait and mobility: Secondary | ICD-10-CM | POA: Diagnosis not present

## 2022-07-27 DIAGNOSIS — R2681 Unsteadiness on feet: Secondary | ICD-10-CM | POA: Diagnosis not present

## 2022-07-29 DIAGNOSIS — R2681 Unsteadiness on feet: Secondary | ICD-10-CM | POA: Diagnosis not present

## 2022-07-29 DIAGNOSIS — F411 Generalized anxiety disorder: Secondary | ICD-10-CM | POA: Diagnosis not present

## 2022-07-29 DIAGNOSIS — F5101 Primary insomnia: Secondary | ICD-10-CM | POA: Diagnosis not present

## 2022-07-29 DIAGNOSIS — G301 Alzheimer's disease with late onset: Secondary | ICD-10-CM | POA: Diagnosis not present

## 2022-07-29 DIAGNOSIS — R2689 Other abnormalities of gait and mobility: Secondary | ICD-10-CM | POA: Diagnosis not present

## 2022-07-29 DIAGNOSIS — F062 Psychotic disorder with delusions due to known physiological condition: Secondary | ICD-10-CM | POA: Diagnosis not present

## 2022-07-29 DIAGNOSIS — F331 Major depressive disorder, recurrent, moderate: Secondary | ICD-10-CM | POA: Diagnosis not present

## 2022-07-29 DIAGNOSIS — M6281 Muscle weakness (generalized): Secondary | ICD-10-CM | POA: Diagnosis not present

## 2022-07-29 DIAGNOSIS — F02B2 Dementia in other diseases classified elsewhere, moderate, with psychotic disturbance: Secondary | ICD-10-CM | POA: Diagnosis not present

## 2022-07-29 DIAGNOSIS — R443 Hallucinations, unspecified: Secondary | ICD-10-CM | POA: Diagnosis not present

## 2022-07-29 DIAGNOSIS — G20 Parkinson's disease: Secondary | ICD-10-CM | POA: Diagnosis not present

## 2022-08-01 DIAGNOSIS — G20 Parkinson's disease: Secondary | ICD-10-CM | POA: Diagnosis not present

## 2022-08-01 DIAGNOSIS — F02B2 Dementia in other diseases classified elsewhere, moderate, with psychotic disturbance: Secondary | ICD-10-CM | POA: Diagnosis not present

## 2022-08-01 DIAGNOSIS — G301 Alzheimer's disease with late onset: Secondary | ICD-10-CM | POA: Diagnosis not present

## 2022-08-01 DIAGNOSIS — N3090 Cystitis, unspecified without hematuria: Secondary | ICD-10-CM | POA: Diagnosis not present

## 2022-08-02 DIAGNOSIS — G20 Parkinson's disease: Secondary | ICD-10-CM | POA: Diagnosis not present

## 2022-08-02 DIAGNOSIS — R2689 Other abnormalities of gait and mobility: Secondary | ICD-10-CM | POA: Diagnosis not present

## 2022-08-02 DIAGNOSIS — R2681 Unsteadiness on feet: Secondary | ICD-10-CM | POA: Diagnosis not present

## 2022-08-02 DIAGNOSIS — F331 Major depressive disorder, recurrent, moderate: Secondary | ICD-10-CM | POA: Diagnosis not present

## 2022-08-02 DIAGNOSIS — F411 Generalized anxiety disorder: Secondary | ICD-10-CM | POA: Diagnosis not present

## 2022-08-02 DIAGNOSIS — M6281 Muscle weakness (generalized): Secondary | ICD-10-CM | POA: Diagnosis not present

## 2022-08-03 DIAGNOSIS — R2689 Other abnormalities of gait and mobility: Secondary | ICD-10-CM | POA: Diagnosis not present

## 2022-08-03 DIAGNOSIS — R2681 Unsteadiness on feet: Secondary | ICD-10-CM | POA: Diagnosis not present

## 2022-08-03 DIAGNOSIS — M6281 Muscle weakness (generalized): Secondary | ICD-10-CM | POA: Diagnosis not present

## 2022-08-03 DIAGNOSIS — G20 Parkinson's disease: Secondary | ICD-10-CM | POA: Diagnosis not present

## 2022-08-04 DIAGNOSIS — R2681 Unsteadiness on feet: Secondary | ICD-10-CM | POA: Diagnosis not present

## 2022-08-04 DIAGNOSIS — R2689 Other abnormalities of gait and mobility: Secondary | ICD-10-CM | POA: Diagnosis not present

## 2022-08-04 DIAGNOSIS — G20 Parkinson's disease: Secondary | ICD-10-CM | POA: Diagnosis not present

## 2022-08-04 DIAGNOSIS — M6281 Muscle weakness (generalized): Secondary | ICD-10-CM | POA: Diagnosis not present

## 2022-08-09 DIAGNOSIS — G20 Parkinson's disease: Secondary | ICD-10-CM | POA: Diagnosis not present

## 2022-08-09 DIAGNOSIS — R2681 Unsteadiness on feet: Secondary | ICD-10-CM | POA: Diagnosis not present

## 2022-08-09 DIAGNOSIS — M6281 Muscle weakness (generalized): Secondary | ICD-10-CM | POA: Diagnosis not present

## 2022-08-09 DIAGNOSIS — R2689 Other abnormalities of gait and mobility: Secondary | ICD-10-CM | POA: Diagnosis not present

## 2022-08-10 DIAGNOSIS — R2689 Other abnormalities of gait and mobility: Secondary | ICD-10-CM | POA: Diagnosis not present

## 2022-08-10 DIAGNOSIS — G20 Parkinson's disease: Secondary | ICD-10-CM | POA: Diagnosis not present

## 2022-08-10 DIAGNOSIS — M6281 Muscle weakness (generalized): Secondary | ICD-10-CM | POA: Diagnosis not present

## 2022-08-10 DIAGNOSIS — R2681 Unsteadiness on feet: Secondary | ICD-10-CM | POA: Diagnosis not present

## 2022-08-11 DIAGNOSIS — G20 Parkinson's disease: Secondary | ICD-10-CM | POA: Diagnosis not present

## 2022-08-11 DIAGNOSIS — R2681 Unsteadiness on feet: Secondary | ICD-10-CM | POA: Diagnosis not present

## 2022-08-11 DIAGNOSIS — R2689 Other abnormalities of gait and mobility: Secondary | ICD-10-CM | POA: Diagnosis not present

## 2022-08-11 DIAGNOSIS — M6281 Muscle weakness (generalized): Secondary | ICD-10-CM | POA: Diagnosis not present

## 2022-08-12 DIAGNOSIS — R2681 Unsteadiness on feet: Secondary | ICD-10-CM | POA: Diagnosis not present

## 2022-08-12 DIAGNOSIS — M6281 Muscle weakness (generalized): Secondary | ICD-10-CM | POA: Diagnosis not present

## 2022-08-12 DIAGNOSIS — G20 Parkinson's disease: Secondary | ICD-10-CM | POA: Diagnosis not present

## 2022-08-12 DIAGNOSIS — R2689 Other abnormalities of gait and mobility: Secondary | ICD-10-CM | POA: Diagnosis not present

## 2022-08-16 DIAGNOSIS — R2689 Other abnormalities of gait and mobility: Secondary | ICD-10-CM | POA: Diagnosis not present

## 2022-08-16 DIAGNOSIS — G20 Parkinson's disease: Secondary | ICD-10-CM | POA: Diagnosis not present

## 2022-08-16 DIAGNOSIS — M6281 Muscle weakness (generalized): Secondary | ICD-10-CM | POA: Diagnosis not present

## 2022-08-16 DIAGNOSIS — R2681 Unsteadiness on feet: Secondary | ICD-10-CM | POA: Diagnosis not present

## 2022-08-17 ENCOUNTER — Emergency Department (HOSPITAL_COMMUNITY)
Admission: EM | Admit: 2022-08-17 | Discharge: 2022-08-17 | Disposition: A | Discharge status: Skilled Nursing Facility | Payer: Medicare Other | Attending: Emergency Medicine | Admitting: Emergency Medicine

## 2022-08-17 ENCOUNTER — Ambulatory Visit: Payer: Medicare Other | Admitting: Physician Assistant

## 2022-08-17 ENCOUNTER — Emergency Department (HOSPITAL_COMMUNITY): Payer: Medicare Other

## 2022-08-17 ENCOUNTER — Other Ambulatory Visit: Payer: Self-pay

## 2022-08-17 DIAGNOSIS — Y92002 Bathroom of unspecified non-institutional (private) residence single-family (private) house as the place of occurrence of the external cause: Secondary | ICD-10-CM | POA: Insufficient documentation

## 2022-08-17 DIAGNOSIS — W182XXA Fall in (into) shower or empty bathtub, initial encounter: Secondary | ICD-10-CM | POA: Insufficient documentation

## 2022-08-17 DIAGNOSIS — W19XXXA Unspecified fall, initial encounter: Secondary | ICD-10-CM

## 2022-08-17 DIAGNOSIS — M25512 Pain in left shoulder: Secondary | ICD-10-CM | POA: Insufficient documentation

## 2022-08-17 DIAGNOSIS — S8012XA Contusion of left lower leg, initial encounter: Secondary | ICD-10-CM | POA: Diagnosis not present

## 2022-08-17 DIAGNOSIS — T148XXA Other injury of unspecified body region, initial encounter: Secondary | ICD-10-CM | POA: Diagnosis not present

## 2022-08-17 DIAGNOSIS — S8992XA Unspecified injury of left lower leg, initial encounter: Secondary | ICD-10-CM | POA: Diagnosis present

## 2022-08-17 DIAGNOSIS — I10 Essential (primary) hypertension: Secondary | ICD-10-CM | POA: Diagnosis not present

## 2022-08-17 DIAGNOSIS — S8011XA Contusion of right lower leg, initial encounter: Secondary | ICD-10-CM | POA: Diagnosis not present

## 2022-08-17 DIAGNOSIS — Z043 Encounter for examination and observation following other accident: Secondary | ICD-10-CM | POA: Diagnosis not present

## 2022-08-17 DIAGNOSIS — N390 Urinary tract infection, site not specified: Secondary | ICD-10-CM | POA: Insufficient documentation

## 2022-08-17 DIAGNOSIS — F0390 Unspecified dementia without behavioral disturbance: Secondary | ICD-10-CM | POA: Insufficient documentation

## 2022-08-17 DIAGNOSIS — R5381 Other malaise: Secondary | ICD-10-CM | POA: Diagnosis not present

## 2022-08-17 DIAGNOSIS — S0990XA Unspecified injury of head, initial encounter: Secondary | ICD-10-CM | POA: Diagnosis not present

## 2022-08-17 DIAGNOSIS — I700 Atherosclerosis of aorta: Secondary | ICD-10-CM | POA: Diagnosis not present

## 2022-08-17 DIAGNOSIS — M1612 Unilateral primary osteoarthritis, left hip: Secondary | ICD-10-CM | POA: Diagnosis not present

## 2022-08-17 DIAGNOSIS — S199XXA Unspecified injury of neck, initial encounter: Secondary | ICD-10-CM | POA: Diagnosis not present

## 2022-08-17 DIAGNOSIS — T07XXXA Unspecified multiple injuries, initial encounter: Secondary | ICD-10-CM

## 2022-08-17 LAB — URINALYSIS, ROUTINE W REFLEX MICROSCOPIC
Bilirubin Urine: NEGATIVE
Glucose, UA: NEGATIVE mg/dL
Hgb urine dipstick: NEGATIVE
Ketones, ur: NEGATIVE mg/dL
Nitrite: POSITIVE — AB
Protein, ur: NEGATIVE mg/dL
Specific Gravity, Urine: 1.025 (ref 1.005–1.030)
pH: 5 (ref 5.0–8.0)

## 2022-08-17 LAB — CBC
HCT: 36.8 % (ref 36.0–46.0)
Hemoglobin: 12.4 g/dL (ref 12.0–15.0)
MCH: 31.7 pg (ref 26.0–34.0)
MCHC: 33.7 g/dL (ref 30.0–36.0)
MCV: 94.1 fL (ref 80.0–100.0)
Platelets: 210 10*3/uL (ref 150–400)
RBC: 3.91 MIL/uL (ref 3.87–5.11)
RDW: 12.3 % (ref 11.5–15.5)
WBC: 5.9 10*3/uL (ref 4.0–10.5)
nRBC: 0 % (ref 0.0–0.2)

## 2022-08-17 LAB — BASIC METABOLIC PANEL
Anion gap: 8 (ref 5–15)
BUN: 17 mg/dL (ref 8–23)
CO2: 26 mmol/L (ref 22–32)
Calcium: 8.8 mg/dL — ABNORMAL LOW (ref 8.9–10.3)
Chloride: 107 mmol/L (ref 98–111)
Creatinine, Ser: 0.88 mg/dL (ref 0.44–1.00)
GFR, Estimated: >60 mL/min (ref >60)
Glucose, Bld: 96 mg/dL (ref 70–99)
Potassium: 3.9 mmol/L (ref 3.5–5.1)
Sodium: 141 mmol/L (ref 135–145)

## 2022-08-17 MED ORDER — FOSFOMYCIN TROMETHAMINE 3 G PO PACK
3.0000 g | PACK | Freq: Once | ORAL | Status: AC
  Administered 2022-08-17: 3 g via ORAL
  Filled 2022-08-17: qty 3

## 2022-08-17 NOTE — ED Notes (Signed)
Pt discharge paperwork gone over with daughters and pt wheeled out to daughters car.

## 2022-08-17 NOTE — ED Triage Notes (Addendum)
Pt from [facility] with hx of dementia and frequent falls, found on floor in the bathroom this morning. Staff last saw her in her bed at 0330 today. Pt oriented to baseline, no obvious signs of head trauma. C/o L shoulder and L pain hip with palpation. C collar applied by EMS.

## 2022-08-17 NOTE — Discharge Instructions (Addendum)
Patient evaluated with CT and x-rays. No broken bones found Please return if any new problems or worsening symptoms She is being treated for uti with fosfomycin and urine culture will be obtained. Please walk only with walker

## 2022-08-17 NOTE — ED Provider Notes (Signed)
[facility] EMERGENCY DEPARTMENT Provider Note   CSN: [id number] Arrival date & time: 08/17/22  6734     History  Chief Complaint  Patient presents with   [name] is a 86 y.o. female.  HPI Level 5 caveat secondary to dementia 86 yo female ho dementia found on floor at [place] today.  Patient states she fell waling the dog.  Some pain in left shoulder.  She states she feels weak.     Home Medications Prior to Admission medications   Medication Sig Start Date End Date Taking? Authorizing Provider  buPROPion ER [facility] SR) 100 MG 12 hr tablet TAKE ONE TABLET BY MOUTH EVERY MORNING FOR MOOD 06/17/22   Cameron Sprang, MD  Calcium Carbonate-Vitamin D (CALTRATE 600+D PO) Take 1 each by mouth daily. gummie    [provider]  carbidopa-levodopa (SINEMET IR) 25-100 MG tablet TAKE 1 TABLET BY MOUTH THREE TIMES A DAY WITH MEALS 05/31/22   Cameron Sprang, MD  dorzolamide-timolol (COSOPT) 22.3-6.8 MG/ML ophthalmic solution Place 1 drop into both eyes 2 (two) times daily.    [provider]  fluticasone (FLONASE) 50 MCG/ACT nasal spray Place 1 spray into both nostrils in the morning, at noon, and at bedtime.    [provider]  Melatonin 1 MG CHEW Chew 3 mg by mouth at bedtime as needed (sleep). Taking at 5pm and 7pm    [provider]  Multiple Vitamins-Minerals (MULTIVITAMIN GUMMIES ADULT PO) Take by mouth daily.    [provider]  psyllium (METAMUCIL SMOOTH TEXTURE) 58.6 % powder Take 1 packet by mouth daily. Patient taking differently: Take 1 packet by mouth as needed (constipation). 08/03/20   [name], MD  rivastigmine (EXELON) 4.6 mg/24hr Place 1 patch (4.6 mg total) onto the skin daily. 04/25/22   Cameron Sprang, MD  sertraline (ZOLOFT) 50 MG tablet TAKE ONE TABLET BY MOUTH DAILY FOR ANXIETY 06/17/22   Cameron Sprang, MD      Allergies    Aricept Reather Littler hcl], Cephalexin, Lorazepam, Other,  and Seroquel [quetiapine]    Review of Systems   Review of Systems  Physical Exam Updated Vital Signs BP (!) 144/79   Pulse (!) 56   Temp 98.2 F (36.8 C) (Oral)   Resp 15   SpO2 97%  Physical Exam Vitals and nursing note reviewed.  Constitutional:      Appearance: Normal appearance.  HENT:     Head: Normocephalic.     Right Ear: External ear normal.     Left Ear: External ear normal.     Nose: Nose normal.     Mouth/Throat:     Mouth: Mucous membranes are moist.     Pharynx: Oropharynx is clear.  Eyes:     Extraocular Movements: Extraocular movements intact.     Pupils: Pupils are equal, round, and reactive to light.  Neck:     Comments: No trauma noted anteriorly, trachea midline Collar in place No point ttp over cervical spine Cardiovascular:     Rate and Rhythm: Normal rate and regular rhythm.     Pulses: Normal pulses.  Pulmonary:     Effort: Pulmonary effort is normal.     Breath sounds: Normal breath sounds.  Abdominal:     General: Abdomen is flat. Bowel sounds are normal.     Palpations: Abdomen is soft.  Musculoskeletal:        General: Tenderness and signs of injury  present.     Comments: Multiple contusion to bilateral lower lower legs but appear purple and bronzed- older than today's fall- no ttp Mild ttp over left hip Some ttp left shoulder No ttp cervical, thoracic or lumbar ttp Pelvis stable.  Skin:    General: Skin is warm and dry.     Capillary Refill: Capillary refill takes less than 2 seconds.  Neurological:     General: No focal deficit present.     Mental Status: She is alert.     Motor: No weakness.     Comments: Patient ordered to name, not place or date  Psychiatric:        Mood and Affect: Mood normal.     ED Results / Procedures / Treatments   Labs (all labs ordered are listed, but only abnormal results are displayed) Labs Reviewed  BASIC METABOLIC PANEL - Abnormal; Notable for the following components:      Result Value    Calcium 8.8 (*)    All other components within normal limits  URINALYSIS, ROUTINE W REFLEX MICROSCOPIC - Abnormal; Notable for the following components:   Nitrite POSITIVE (*)    Leukocytes,Ua MODERATE (*)    Bacteria, UA FEW (*)    Non Squamous Epithelial 0-5 (*)    All other components within normal limits  URINE CULTURE  CBC    EKG None  Radiology CT Head Wo Contrast  Result Date: 08/17/2022 CLINICAL DATA:  Head trauma, minor (Age >= 65y); Neck trauma (Age >= 65y) EXAM: CT HEAD WITHOUT CONTRAST CT CERVICAL SPINE WITHOUT CONTRAST TECHNIQUE: Multidetector CT imaging of the head and cervical spine was performed following the standard protocol without intravenous contrast. Multiplanar CT image reconstructions of the cervical spine were also generated. RADIATION DOSE REDUCTION: This exam was performed according to the departmental dose-optimization program which includes automated exposure control, adjustment of the mA and/or kV according to patient size and/or use of iterative reconstruction technique. COMPARISON:  CT head April 17, 23. FINDINGS: CT HEAD FINDINGS Brain: No evidence of acute infarction, hemorrhage, hydrocephalus, extra-axial collection or mass lesion/mass effect. Vascular: No hyperdense vessel or unexpected calcification. Skull: Normal. Negative for fracture or focal lesion. Sinuses/Orbits: No acute finding. CT CERVICAL SPINE FINDINGS Alignment: Slight anterolisthesis of C3 on C4 and C4 on C5, favored to be degenerative given facet arthropathy at these levels. Skull base and vertebrae: No evidence of acute fracture. Vertebral body heights are maintained. Soft tissues and spinal canal: No prevertebral fluid or swelling. No visible canal hematoma. Disc levels: Multilevel degenerative disease and facet/uncovertebral hypertrophy with varying degrees of neural foraminal stenosis. Upper chest: Visualized lung apices are clear. IMPRESSION: 1. No evidence of acute intracranial abnormality. 2.  No evidence of acute fracture or traumatic malalignment the cervical spine. Electronically Signed   By: Margaretha Sheffield M.D.   On: 08/17/2022 08:11   CT Cervical Spine Wo Contrast  Result Date: 08/17/2022 CLINICAL DATA:  Head trauma, minor (Age >= 65y); Neck trauma (Age >= 65y) EXAM: CT HEAD WITHOUT CONTRAST CT CERVICAL SPINE WITHOUT CONTRAST TECHNIQUE: Multidetector CT imaging of the head and cervical spine was performed following the standard protocol without intravenous contrast. Multiplanar CT image reconstructions of the cervical spine were also generated. RADIATION DOSE REDUCTION: This exam was performed according to the departmental dose-optimization program which includes automated exposure control, adjustment of the mA and/or kV according to patient size and/or use of iterative reconstruction technique. COMPARISON:  CT head April 17, 23. FINDINGS: CT HEAD FINDINGS Brain:  No evidence of acute infarction, hemorrhage, hydrocephalus, extra-axial collection or mass lesion/mass effect. Vascular: No hyperdense vessel or unexpected calcification. Skull: Normal. Negative for fracture or focal lesion. Sinuses/Orbits: No acute finding. CT CERVICAL SPINE FINDINGS Alignment: Slight anterolisthesis of C3 on C4 and C4 on C5, favored to be degenerative given facet arthropathy at these levels. Skull base and vertebrae: No evidence of acute fracture. Vertebral body heights are maintained. Soft tissues and spinal canal: No prevertebral fluid or swelling. No visible canal hematoma. Disc levels: Multilevel degenerative disease and facet/uncovertebral hypertrophy with varying degrees of neural foraminal stenosis. Upper chest: Visualized lung apices are clear. IMPRESSION: 1. No evidence of acute intracranial abnormality. 2. No evidence of acute fracture or traumatic malalignment the cervical spine. Electronically Signed   By: Margaretha Sheffield M.D.   On: 08/17/2022 08:11   DG Chest 1 View  Result Date: 08/17/2022 CLINICAL  DATA:  86 year old female after fall. EXAM: CHEST  1 VIEW COMPARISON:  02/26/2022 FINDINGS: The heart size and mediastinal contours are within normal limits. Atherosclerotic calcification of the aortic arch. Both lungs are clear. The visualized skeletal structures are unremarkable. IMPRESSION: 1. No acute cardiopulmonary process. 2.  Aortic Atherosclerosis (ICD10-I70.0). Electronically Signed   By: Ruthann Cancer M.D.   On: 08/17/2022 07:58   DG Hip Unilat W or Wo Pelvis 2-3 Views Left  Result Date: 08/17/2022 CLINICAL DATA:  86 year old female after fall. EXAM: DG HIP (WITH OR WITHOUT PELVIS) 2-3V LEFT COMPARISON:  None Available. FINDINGS: There is no evidence of hip fracture or dislocation. Mild left hip joint space narrowing and periarticular osteophyte formation. Soft tissues are within normal limits. IMPRESSION: 1. No acute fracture or malalignment. 2. Mild left hip degenerative change. Electronically Signed   By: Ruthann Cancer M.D.   On: 08/17/2022 07:56   DG Shoulder Left  Result Date: 08/17/2022 CLINICAL DATA:  86 year old female after fall. EXAM: LEFT SHOULDER - 2+ VIEW COMPARISON:  None Available. FINDINGS: There is no evidence of fracture or dislocation. There is no evidence of arthropathy or other focal bone abnormality. Soft tissues are unremarkable. IMPRESSION: No acute fracture or malalignment. Electronically Signed   By: Ruthann Cancer M.D.   On: 08/17/2022 07:55    Procedures Procedures    Medications Ordered in ED Medications  fosfomycin (MONUROL) packet 3 g (has no administration in time range)    ED Course/ Medical Decision Making/ A&P Clinical Course as of 08/17/22 1048  Wed Aug 17, 2022  0815 CT head reviewed and CT cervical spine reviewed with no evidence of acute injury noted on my review and interpretation and radiologist interpretation concurs [DR]  [phone] Left hip x-Mandela Bello reviewed and interpreted with no acute findings and radiologist intepretation concurs [DR]  0839 Left  shoulder x-Liann Spaeth reviewed and interpreted with no acute abnormality- radiologist interpretation concurs [DR]  0840 Cbc reviewed and within normal limit [DR]  0848 Bmet reviewed and interpreted with mild hypocalcemia [DR]  0934 Nursing reports patient ambulated with assistance without difficulty [DR]  1046 Urinalysis clean-catch nitrite positive, leukocyte Estrace moderate, RBC 0-5, WBC 40-20, few bacteria Plan culture and treat [DR]    Clinical Course User Index [DR] Pattricia Boss, MD                           Medical Decision Making 86 yo female with dementia found on floor this am. Further history from daughter who was called by facility- no definite loc unclear exactly what occurred.  Here in ED patient with some complaints of shoulder pain Multiple age bruises CT head and neck and plain radiographs without acute fx Collar removed Labs wnl Urine pending-UA consistent with Patient has history of complex, Plan fosfomycin Plan ambulation and d/c to facility  Amount and/or Complexity of Data Reviewed Independent Historian:     Details: Daughters at bedside External Data Reviewed:     Details: DNR at bedside Labs: ordered. Decision-making details documented in ED Course. Radiology: ordered and independent interpretation performed. Decision-making details documented in ED Course. ECG/medicine tests: ordered and independent interpretation performed. Decision-making details documented in ED Course.  Risk Prescription drug management. Decision regarding hospitalization.           Final Clinical Impression(s) / ED Diagnoses Final diagnoses:  Fall, initial encounter  Multiple contusions  Dementia, unspecified dementia severity, unspecified dementia type, unspecified whether behavioral, psychotic, or mood disturbance or anxiety ([place])  Urinary tract infection without hematuria, site unspecified    Rx / DC Orders ED Discharge Orders     None         Pattricia Boss,  MD 08/17/22 1049

## 2022-08-18 DIAGNOSIS — M6281 Muscle weakness (generalized): Secondary | ICD-10-CM | POA: Diagnosis not present

## 2022-08-18 DIAGNOSIS — R2689 Other abnormalities of gait and mobility: Secondary | ICD-10-CM | POA: Diagnosis not present

## 2022-08-18 DIAGNOSIS — G20 Parkinson's disease: Secondary | ICD-10-CM | POA: Diagnosis not present

## 2022-08-18 DIAGNOSIS — R2681 Unsteadiness on feet: Secondary | ICD-10-CM | POA: Diagnosis not present

## 2022-08-19 DIAGNOSIS — M6281 Muscle weakness (generalized): Secondary | ICD-10-CM | POA: Diagnosis not present

## 2022-08-19 DIAGNOSIS — R2689 Other abnormalities of gait and mobility: Secondary | ICD-10-CM | POA: Diagnosis not present

## 2022-08-19 DIAGNOSIS — G20 Parkinson's disease: Secondary | ICD-10-CM | POA: Diagnosis not present

## 2022-08-19 DIAGNOSIS — R2681 Unsteadiness on feet: Secondary | ICD-10-CM | POA: Diagnosis not present

## 2022-08-19 LAB — URINE CULTURE: Culture: 40000 — AB

## 2022-08-20 ENCOUNTER — Telehealth: Payer: Self-pay | Admitting: Emergency Medicine

## 2022-08-20 NOTE — Telephone Encounter (Signed)
Post ED Visit - Positive Culture Follow-up  Culture report reviewed by antimicrobial stewardship pharmacist: [place] Team '[]'$  Elenor Quinones, Pharm.D. '[]'$  Heide Guile, Pharm.D., BCPS AQ-ID '[]'$  Parks Neptune, Pharm.D., BCPS '[]'$  Alycia Rossetti, Pharm.D., BCPS '[]'$  [place], Pharm.D., BCPS, AAHIVP '[]'$  Legrand Como, Pharm.D., BCPS, AAHIVP '[]'$  Salome Arnt, PharmD, BCPS '[]'$  Johnnette Gourd, PharmD, BCPS '[]'$  Hughes Better, PharmD, BCPS '[]'$  Leeroy Cha, PharmD '[]'$  Laqueta Linden, PharmD, BCPS '[]'$  Albertina Parr, PharmD  [place] Team '[]'$  Leodis Sias, PharmD '[]'$  Lindell Spar, PharmD '[]'$  Royetta Asal, PharmD '[]'$  Graylin Shiver, Rph '[]'$  Rema Fendt) Glennon Mac, PharmD '[]'$  Arlyn Dunning, PharmD '[]'$  Netta Cedars, PharmD '[]'$  Dia Sitter, PharmD '[]'$  Leone Haven, PharmD '[]'$  Gretta Arab, PharmD '[]'$  Theodis Shove, PharmD '[]'$  Peggyann Juba, PharmD '[]'$  Reuel Boom, PharmD   Positive urine culture Treated with fosfomycin, organism sensitive to the same and no further patient follow-up is required at this time.  Hazle Nordmann 08/20/2022, 1:24 PM

## 2022-08-22 DIAGNOSIS — M6281 Muscle weakness (generalized): Secondary | ICD-10-CM | POA: Diagnosis not present

## 2022-08-22 DIAGNOSIS — G20 Parkinson's disease: Secondary | ICD-10-CM | POA: Diagnosis not present

## 2022-08-22 DIAGNOSIS — R2681 Unsteadiness on feet: Secondary | ICD-10-CM | POA: Diagnosis not present

## 2022-08-22 DIAGNOSIS — F5101 Primary insomnia: Secondary | ICD-10-CM | POA: Diagnosis not present

## 2022-08-22 DIAGNOSIS — N3090 Cystitis, unspecified without hematuria: Secondary | ICD-10-CM | POA: Diagnosis not present

## 2022-08-22 DIAGNOSIS — R2689 Other abnormalities of gait and mobility: Secondary | ICD-10-CM | POA: Diagnosis not present

## 2022-08-23 ENCOUNTER — Ambulatory Visit (INDEPENDENT_AMBULATORY_CARE_PROVIDER_SITE_OTHER): Payer: Medicare Other | Admitting: Physician Assistant

## 2022-08-23 VITALS — BP 111/60 | HR 66 | Ht 63.0 in | Wt 115.0 lb

## 2022-08-23 DIAGNOSIS — G20 Parkinson's disease: Secondary | ICD-10-CM | POA: Diagnosis not present

## 2022-08-23 DIAGNOSIS — F02818 Dementia in other diseases classified elsewhere, unspecified severity, with other behavioral disturbance: Secondary | ICD-10-CM | POA: Diagnosis not present

## 2022-08-23 DIAGNOSIS — M6281 Muscle weakness (generalized): Secondary | ICD-10-CM | POA: Diagnosis not present

## 2022-08-23 DIAGNOSIS — G301 Alzheimer's disease with late onset: Secondary | ICD-10-CM | POA: Diagnosis not present

## 2022-08-23 DIAGNOSIS — R2689 Other abnormalities of gait and mobility: Secondary | ICD-10-CM | POA: Diagnosis not present

## 2022-08-23 DIAGNOSIS — R2681 Unsteadiness on feet: Secondary | ICD-10-CM | POA: Diagnosis not present
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
## 2022-08-23 NOTE — Patient Instructions (Addendum)
Good to see you.  OK to discontinue rivastigmine   2. Continue follow-up with Psychiatry  3. Continue with regular exercise, meals and keeping hydrated  Continue PT/ot and speech therapy   4. Follow-up in 6 months, call for any changes    For psychiatric meds, mood meds: Please have your primary care physician manage these medications.   Counseling regarding caregiver distress, including caregiver depression, anxiety and issues regarding community resources, adult day care programs, adult living facilities, or memory care questions:   Feel free to contact [place], Social Worker at [phone]   For assessment of decision of mental capacity and competency:  Call Dr. Anthoney Harada, geriatric psychiatrist at [phone]  For guidance in geriatric dementia issues please call [facility] [phone]  For guidance regarding [facility] and if placement were needed at the facility, contact Arnell Asal, Social Worker tel: [phone]  If you have any severe symptoms of a stroke, or other severe issues such as confusion,severe chills or fever, etc call 911 or go to the ER as you may need to be evaluated further      FALL PRECAUTIONS: Be cautious when walking. Scan the area for obstacles that may increase the risk of trips and falls. When getting up in the mornings, sit up at the edge of the bed for a few minutes before getting out of bed. Consider elevating the bed at the head end to avoid drop of blood pressure when getting up. Walk always in a well-lit room (use night lights in the walls). Avoid area rugs or power cords from appliances in the middle of the walkways. Use a walker or a cane if necessary and consider physical therapy for balance exercise. Get your eyesight checked regularly.   HOME SAFETY: Consider the safety of the kitchen when operating appliances like stoves, microwave oven, and blender. Consider having supervision and  share cooking responsibilities until no longer able to participate in those. Accidents with firearms and other hazards in the house should be identified and addressed as well.   ABILITY TO BE LEFT ALONE: If patient is unable to contact 911 operator, consider using LifeLine, or when the need is there, arrange for someone to stay with patients. Smoking is a fire hazard, consider supervision or cessation. Risk of wandering should be assessed by caregiver and if detected at any point, supervision and safe proof recommendations should be instituted.  MEDICATION SUPERVISION: Inability to self-administer medication needs to be constantly addressed. Implement a mechanism to ensure safe administration of the medications.  RECOMMENDATIONS FOR ALL PATIENTS WITH MEMORY PROBLEMS: 1. Continue to exercise (Recommend 30 minutes of walking everyday, or 3 hours every week) 2. Increase social interactions - continue going to [place] and enjoy social gatherings with friends and family 3. Eat healthy, avoid fried foods and eat more fruits and vegetables 4. Maintain adequate blood pressure, blood sugar, and blood cholesterol level. Reducing the risk of stroke and cardiovascular disease also helps promoting better memory. 5. Avoid stressful situations. Live a simple life and avoid aggravations. Organize your time and prepare for the next day in anticipation. 6. Sleep well, avoid any interruptions of sleep and avoid any distractions in the bedroom that may interfere with adequate sleep quality 7. Avoid sugar, avoid sweets as there is a strong link between excessive sugar intake, diabetes, and cognitive impairment The Mediterranean diet has been shown to help patients reduce the risk of progressive memory disorders and reduces cardiovascular risk. This includes eating  fish, eat fruits and green leafy vegetables, nuts like almonds and hazelnuts, walnuts, and also use olive oil. Avoid fast foods and fried foods as much as  possible. Avoid sweets and sugar as sugar use has been linked to worsening of memory function.  It was a pleasure to see you today at our office.       RECOMMENDATIONS FOR ALL PATIENTS WITH MEMORY PROBLEMS: 1. Continue to exercise (Recommend 30 minutes of walking everyday, or 3 hours every week) 2. Increase social interactions - continue going to [place] and enjoy social gatherings with friends and family 3. Eat healthy, avoid fried foods and eat more fruits and vegetables 4. Maintain adequate blood pressure, blood sugar, and blood cholesterol level. Reducing the risk of stroke and cardiovascular disease also helps promoting better memory. 5. Avoid stressful situations. Live a simple life and avoid aggravations. Organize your time and prepare for the next day in anticipation. 6. Sleep well, avoid any interruptions of sleep and avoid any distractions in the bedroom that may interfere with adequate sleep quality 7. Avoid sugar, avoid sweets as there is a strong link between excessive sugar intake, diabetes, and cognitive impairment We discussed the Mediterranean diet, which has been shown to help patients reduce the risk of progressive memory disorders and reduces cardiovascular risk. This includes eating fish, eat fruits and green leafy vegetables, nuts like almonds and hazelnuts, walnuts, and also use olive oil. Avoid fast foods and fried foods as much as possible. Avoid sweets and sugar as sugar use has been linked to worsening of memory function.  There is always a concern of gradual progression of memory problems. If this is the case, then we may need to adjust level of care according to patient needs. Support, both to the patient and caregiver, should then be put into place.    The [employer] Association is here all day, every day for people facing Alzheimer's disease through our free 24/7 Helpline: [phone]. The Helpline provides reliable information and support to all those who need  assistance, such as individuals living with memory loss, Alzheimer's or other dementia, caregivers, health care professionals and the public.  Our highly trained and knowledgeable staff can help you with: Understanding memory loss, dementia and Alzheimer's  Medications and other treatment options  General information about aging and brain health  Skills to provide quality care and to find the best care from professionals  Legal, financial and living-arrangement decisions Our Helpline also features: Confidential care consultation provided by master's level clinicians who can help with decision-making support, crisis assistance and education on issues families face every day  Help in a caller's preferred language using our translation service that features more than 200 languages and dialects  Referrals to local community programs, services and ongoing support     FALL PRECAUTIONS: Be cautious when walking. Scan the area for obstacles that may increase the risk of trips and falls. When getting up in the mornings, sit up at the edge of the bed for a few minutes before getting out of bed. Consider elevating the bed at the head end to avoid drop of blood pressure when getting up. Walk always in a well-lit room (use night lights in the walls). Avoid area rugs or power cords from appliances in the middle of the walkways. Use a walker or a cane if necessary and consider physical therapy for balance exercise. Get your eyesight checked regularly.  FINANCIAL OVERSIGHT: Supervision, especially oversight when making financial decisions or transactions is also recommended.  HOME SAFETY: Consider the safety of the kitchen when operating appliances like stoves, microwave oven, and blender. Consider having supervision and share cooking responsibilities until no longer able to participate in those. Accidents with firearms and other hazards in the house should be identified and addressed as well.   ABILITY TO BE  LEFT ALONE: If patient is unable to contact 911 operator, consider using LifeLine, or when the need is there, arrange for someone to stay with patients. Smoking is a fire hazard, consider supervision or cessation. Risk of wandering should be assessed by caregiver and if detected at any point, supervision and safe proof recommendations should be instituted.  MEDICATION SUPERVISION: Inability to self-administer medication needs to be constantly addressed. Implement a mechanism to ensure safe administration of the medications.   DRIVING: Regarding driving, in patients with progressive memory problems, driving will be impaired. We advise to have someone else do the driving if trouble finding directions or if minor accidents are reported. Independent driving assessment is available to determine safety of driving.   If you are interested in the driving assessment, you can contact the following:  [employer] in [place]  [place] [phone] or [phone]      [place] refers to food and lifestyle choices that are based on the traditions of countries located on the [employer]. This way of eating has been shown to help prevent certain conditions and improve outcomes for people who have chronic diseases, like kidney disease and heart disease. What are tips for following this plan? Lifestyle  Cook and eat meals together with your family, when possible. Drink enough fluid to keep your urine clear or pale yellow. Be physically active every day. This includes: Aerobic exercise like running or swimming. Leisure activities like gardening, walking, or housework. Get 7-8 hours of sleep each night. If recommended by your health care provider, drink red wine in moderation. This means 1 glass a day for nonpregnant women and 2 glasses a day for men. A  glass of wine equals 5 oz (150 mL). Reading food labels  Check the serving size of packaged foods. For foods such as rice and pasta, the serving size refers to the amount of cooked product, not dry. Check the total fat in packaged foods. Avoid foods that have saturated fat or trans fats. Check the ingredients list for added sugars, such as corn syrup. Shopping  At the grocery store, buy most of your food from the areas near the walls of the store. This includes: Fresh fruits and vegetables (produce). Grains, beans, nuts, and seeds. Some of these may be available in unpackaged forms or large amounts (in bulk). Fresh seafood. Poultry and eggs. Low-fat dairy products. Buy whole ingredients instead of prepackaged foods. Buy fresh fruits and vegetables in-season from local farmers markets. Buy frozen fruits and vegetables in resealable bags. If you do not have access to quality fresh seafood, buy precooked frozen shrimp or canned fish, such as tuna, salmon, or sardines. Buy small amounts of raw or cooked vegetables, salads, or olives from the deli or salad bar at your store. Stock your pantry so you always have certain foods on hand, such as olive oil, canned tuna, canned tomatoes, rice, pasta, and beans. Cooking  Cook foods with extra-virgin olive oil instead of using butter or other vegetable oils. Have meat as a side dish, and have vegetables or grains as your main dish.  This means having meat in small portions or adding small amounts of meat to foods like pasta or stew. Use beans or vegetables instead of meat in common dishes like chili or lasagna. Experiment with different cooking methods. Try roasting or broiling vegetables instead of steaming or sauteing them. Add frozen vegetables to soups, stews, pasta, or rice. Add nuts or seeds for added healthy fat at each meal. You can add these to yogurt, salads, or vegetable dishes. Marinate fish or vegetables using olive oil, lemon juice, garlic,  and fresh herbs. Meal planning  Plan to eat 1 vegetarian meal one day each week. Try to work up to 2 vegetarian meals, if possible. Eat seafood 2 or more times a week. Have healthy snacks readily available, such as: Vegetable sticks with hummus. Greek yogurt. Fruit and nut trail mix. Eat balanced meals throughout the week. This includes: Fruit: 2-3 servings a day Vegetables: 4-5 servings a day Low-fat dairy: 2 servings a day Fish, poultry, or lean meat: 1 serving a day Beans and legumes: 2 or more servings a week Nuts and seeds: 1-2 servings a day Whole grains: 6-8 servings a day Extra-virgin olive oil: 3-4 servings a day Limit red meat and sweets to only a few servings a month What are my food choices? Mediterranean diet Recommended Grains: Whole-grain pasta. Brown rice. Bulgar wheat. Polenta. Couscous. Whole-wheat bread. [place]. Vegetables: Artichokes. Beets. Broccoli. Cabbage. Carrots. Eggplant. Green beans. Chard. Kale. Spinach. Onions. Leeks. Peas. Squash. Tomatoes. Peppers. Radishes. Fruits: Apples. Apricots. Avocado. Berries. Bananas. Cherries. Dates. Figs. Grapes. Lemons. Melon. Oranges. Peaches. Plums. Pomegranate. Meats and other protein foods: Beans. Almonds. Sunflower seeds. Pine nuts. Peanuts. [place]. Salmon. Scallops. Shrimp. [place]. Tilapia. Clams. Oysters. Eggs. Dairy: Low-fat milk. Cheese. Greek yogurt. Beverages: Water. Red wine. Herbal tea. Fats and oils: Extra virgin olive oil. Avocado oil. Grape seed oil. Sweets and desserts: [place] yogurt with honey. Baked apples. Poached pears. Trail mix. Seasoning and other foods: Basil. Cilantro. Coriander. Cumin. Mint. Parsley. Sage. Rosemary. Tarragon. Garlic. Oregano. Thyme. Pepper. Balsalmic vinegar. Tahini. Hummus. Tomato sauce. Olives. Mushrooms. Limit these Grains: Prepackaged pasta or rice dishes. Prepackaged cereal with added sugar. Vegetables: Deep fried potatoes (french fries). Fruits: Fruit canned in  syrup. Meats and other protein foods: Beef. Pork. Lamb. Poultry with skin. Hot dogs. Berniece Salines. Dairy: Ice cream. Sour cream. Whole milk. Beverages: Juice. Sugar-sweetened soft drinks. Beer. Liquor and spirits. Fats and oils: Butter. Canola oil. Vegetable oil. Beef fat (tallow). Lard. Sweets and desserts: Cookies. Cakes. Pies. Candy. Seasoning and other foods: Mayonnaise. Premade sauces and marinades. The items listed may not be a complete list. Talk with your dietitian about what dietary choices are right for you. Summary The Mediterranean diet includes both food and lifestyle choices. Eat a variety of fresh fruits and vegetables, beans, nuts, seeds, and whole grains. Limit the amount of red meat and sweets that you eat. Talk with your health care provider about whether it is safe for you to drink red wine in moderation. This means 1 glass a day for nonpregnant women and 2 glasses a day for men. A glass of wine equals 5 oz (150 mL). This information is not intended to replace advice given to you by your health care provider. Make sure you discuss any questions you have with your health care provider. Document Released: 07/21/2016 Document Revised: 08/23/2016 Document Reviewed: 07/21/2016 Elsevier Interactive Patient Education  2017 [employer].      There is always a concern of gradual progression of memory problems.  If this is the case, then we may need to adjust level of care according to patient needs. Support, both to the patient and caregiver, should then be put into place.

## 2022-08-24 DIAGNOSIS — M6281 Muscle weakness (generalized): Secondary | ICD-10-CM | POA: Diagnosis not present

## 2022-08-24 DIAGNOSIS — R2681 Unsteadiness on feet: Secondary | ICD-10-CM | POA: Diagnosis not present

## 2022-08-24 DIAGNOSIS — R2689 Other abnormalities of gait and mobility: Secondary | ICD-10-CM | POA: Diagnosis not present

## 2022-08-24 DIAGNOSIS — G20 Parkinson's disease: Secondary | ICD-10-CM | POA: Diagnosis not present

## 2022-08-25 DIAGNOSIS — M6281 Muscle weakness (generalized): Secondary | ICD-10-CM | POA: Diagnosis not present

## 2022-08-25 DIAGNOSIS — R2681 Unsteadiness on feet: Secondary | ICD-10-CM | POA: Diagnosis not present

## 2022-08-25 DIAGNOSIS — R300 Dysuria: Secondary | ICD-10-CM | POA: Diagnosis not present

## 2022-08-25 DIAGNOSIS — R2689 Other abnormalities of gait and mobility: Secondary | ICD-10-CM | POA: Diagnosis not present

## 2022-08-25 DIAGNOSIS — G20 Parkinson's disease: Secondary | ICD-10-CM | POA: Diagnosis not present

## 2022-08-26 DIAGNOSIS — F02B2 Dementia in other diseases classified elsewhere, moderate, with psychotic disturbance: Secondary | ICD-10-CM | POA: Diagnosis not present

## 2022-08-26 DIAGNOSIS — G301 Alzheimer's disease with late onset: Secondary | ICD-10-CM | POA: Diagnosis not present

## 2022-08-26 DIAGNOSIS — R443 Hallucinations, unspecified: Secondary | ICD-10-CM | POA: Diagnosis not present

## 2022-08-26 DIAGNOSIS — F331 Major depressive disorder, recurrent, moderate: Secondary | ICD-10-CM | POA: Diagnosis not present

## 2022-08-26 DIAGNOSIS — G20 Parkinson's disease: Secondary | ICD-10-CM | POA: Diagnosis not present

## 2022-08-26 DIAGNOSIS — F411 Generalized anxiety disorder: Secondary | ICD-10-CM | POA: Diagnosis not present

## 2022-08-26 DIAGNOSIS — F062 Psychotic disorder with delusions due to known physiological condition: Secondary | ICD-10-CM | POA: Diagnosis not present

## 2022-08-29 DIAGNOSIS — N3090 Cystitis, unspecified without hematuria: Secondary | ICD-10-CM | POA: Diagnosis not present

## 2022-08-30 DIAGNOSIS — R2681 Unsteadiness on feet: Secondary | ICD-10-CM | POA: Diagnosis not present

## 2022-08-30 DIAGNOSIS — R2689 Other abnormalities of gait and mobility: Secondary | ICD-10-CM | POA: Diagnosis not present

## 2022-08-30 DIAGNOSIS — M6281 Muscle weakness (generalized): Secondary | ICD-10-CM | POA: Diagnosis not present

## 2022-08-30 DIAGNOSIS — G20 Parkinson's disease: Secondary | ICD-10-CM | POA: Diagnosis not present

## 2022-08-31 DIAGNOSIS — R2689 Other abnormalities of gait and mobility: Secondary | ICD-10-CM | POA: Diagnosis not present

## 2022-08-31 DIAGNOSIS — M6281 Muscle weakness (generalized): Secondary | ICD-10-CM | POA: Diagnosis not present

## 2022-08-31 DIAGNOSIS — R2681 Unsteadiness on feet: Secondary | ICD-10-CM | POA: Diagnosis not present

## 2022-08-31 DIAGNOSIS — G20 Parkinson's disease: Secondary | ICD-10-CM | POA: Diagnosis not present

## 2022-09-01 DIAGNOSIS — G20 Parkinson's disease: Secondary | ICD-10-CM | POA: Diagnosis not present

## 2022-09-01 DIAGNOSIS — R2681 Unsteadiness on feet: Secondary | ICD-10-CM | POA: Diagnosis not present

## 2022-09-01 DIAGNOSIS — M6281 Muscle weakness (generalized): Secondary | ICD-10-CM | POA: Diagnosis not present

## 2022-09-01 DIAGNOSIS — R2689 Other abnormalities of gait and mobility: Secondary | ICD-10-CM | POA: Diagnosis not present

## 2022-09-05 DIAGNOSIS — R2681 Unsteadiness on feet: Secondary | ICD-10-CM | POA: Diagnosis not present

## 2022-09-05 DIAGNOSIS — M6281 Muscle weakness (generalized): Secondary | ICD-10-CM | POA: Diagnosis not present

## 2022-09-05 DIAGNOSIS — G20 Parkinson's disease: Secondary | ICD-10-CM | POA: Diagnosis not present

## 2022-09-05 DIAGNOSIS — R2689 Other abnormalities of gait and mobility: Secondary | ICD-10-CM | POA: Diagnosis not present

## 2022-09-06 DIAGNOSIS — M6281 Muscle weakness (generalized): Secondary | ICD-10-CM | POA: Diagnosis not present

## 2022-09-06 DIAGNOSIS — R2689 Other abnormalities of gait and mobility: Secondary | ICD-10-CM | POA: Diagnosis not present

## 2022-09-06 DIAGNOSIS — R2681 Unsteadiness on feet: Secondary | ICD-10-CM | POA: Diagnosis not present

## 2022-09-06 DIAGNOSIS — G20 Parkinson's disease: Secondary | ICD-10-CM | POA: Diagnosis not present

## 2022-09-07 DIAGNOSIS — R2689 Other abnormalities of gait and mobility: Secondary | ICD-10-CM | POA: Diagnosis not present

## 2022-09-07 DIAGNOSIS — M6281 Muscle weakness (generalized): Secondary | ICD-10-CM | POA: Diagnosis not present

## 2022-09-07 DIAGNOSIS — G20 Parkinson's disease: Secondary | ICD-10-CM | POA: Diagnosis not present

## 2022-09-07 DIAGNOSIS — R2681 Unsteadiness on feet: Secondary | ICD-10-CM | POA: Diagnosis not present

## 2022-09-08 DIAGNOSIS — F331 Major depressive disorder, recurrent, moderate: Secondary | ICD-10-CM | POA: Diagnosis not present

## 2022-09-08 DIAGNOSIS — Z23 Encounter for immunization: Secondary | ICD-10-CM | POA: Diagnosis not present

## 2022-09-08 DIAGNOSIS — R2689 Other abnormalities of gait and mobility: Secondary | ICD-10-CM | POA: Diagnosis not present

## 2022-09-08 DIAGNOSIS — F411 Generalized anxiety disorder: Secondary | ICD-10-CM | POA: Diagnosis not present

## 2022-09-08 DIAGNOSIS — M6281 Muscle weakness (generalized): Secondary | ICD-10-CM | POA: Diagnosis not present

## 2022-09-08 DIAGNOSIS — G20 Parkinson's disease: Secondary | ICD-10-CM | POA: Diagnosis not present

## 2022-09-08 DIAGNOSIS — R2681 Unsteadiness on feet: Secondary | ICD-10-CM | POA: Diagnosis not present

## 2022-09-13 DIAGNOSIS — R2681 Unsteadiness on feet: Secondary | ICD-10-CM | POA: Diagnosis not present

## 2022-09-13 DIAGNOSIS — R2689 Other abnormalities of gait and mobility: Secondary | ICD-10-CM | POA: Diagnosis not present

## 2022-09-13 DIAGNOSIS — G20C Parkinsonism, unspecified: Secondary | ICD-10-CM | POA: Diagnosis not present

## 2022-09-13 DIAGNOSIS — R41841 Cognitive communication deficit: Secondary | ICD-10-CM | POA: Diagnosis not present

## 2022-09-13 DIAGNOSIS — M6281 Muscle weakness (generalized): Secondary | ICD-10-CM | POA: Diagnosis not present

## 2022-09-14 DIAGNOSIS — F0392 Unspecified dementia, unspecified severity, with psychotic disturbance: Secondary | ICD-10-CM | POA: Diagnosis not present

## 2022-09-14 DIAGNOSIS — M6281 Muscle weakness (generalized): Secondary | ICD-10-CM | POA: Diagnosis not present

## 2022-09-14 DIAGNOSIS — F331 Major depressive disorder, recurrent, moderate: Secondary | ICD-10-CM | POA: Diagnosis not present

## 2022-09-14 DIAGNOSIS — R41841 Cognitive communication deficit: Secondary | ICD-10-CM | POA: Diagnosis not present

## 2022-09-14 DIAGNOSIS — R410 Disorientation, unspecified: Secondary | ICD-10-CM | POA: Diagnosis not present

## 2022-09-14 DIAGNOSIS — G20B2 Parkinson's disease with dyskinesia, with fluctuations: Secondary | ICD-10-CM | POA: Diagnosis not present

## 2022-09-14 DIAGNOSIS — G20C Parkinsonism, unspecified: Secondary | ICD-10-CM | POA: Diagnosis not present

## 2022-09-14 DIAGNOSIS — R2681 Unsteadiness on feet: Secondary | ICD-10-CM | POA: Diagnosis not present

## 2022-09-14 DIAGNOSIS — R2689 Other abnormalities of gait and mobility: Secondary | ICD-10-CM | POA: Diagnosis not present

## 2022-09-14 DIAGNOSIS — Z87440 Personal history of urinary (tract) infections: Secondary | ICD-10-CM | POA: Diagnosis not present

## 2022-09-15 DIAGNOSIS — R2689 Other abnormalities of gait and mobility: Secondary | ICD-10-CM | POA: Diagnosis not present

## 2022-09-15 DIAGNOSIS — G20C Parkinsonism, unspecified: Secondary | ICD-10-CM | POA: Diagnosis not present

## 2022-09-15 DIAGNOSIS — R2681 Unsteadiness on feet: Secondary | ICD-10-CM | POA: Diagnosis not present

## 2022-09-15 DIAGNOSIS — R41841 Cognitive communication deficit: Secondary | ICD-10-CM | POA: Diagnosis not present

## 2022-09-15 DIAGNOSIS — M6281 Muscle weakness (generalized): Secondary | ICD-10-CM | POA: Diagnosis not present

## 2022-09-16 DIAGNOSIS — G20C Parkinsonism, unspecified: Secondary | ICD-10-CM | POA: Diagnosis not present

## 2022-09-16 DIAGNOSIS — R41841 Cognitive communication deficit: Secondary | ICD-10-CM | POA: Diagnosis not present

## 2022-09-16 DIAGNOSIS — R2681 Unsteadiness on feet: Secondary | ICD-10-CM | POA: Diagnosis not present

## 2022-09-16 DIAGNOSIS — R2689 Other abnormalities of gait and mobility: Secondary | ICD-10-CM | POA: Diagnosis not present

## 2022-09-16 DIAGNOSIS — M6281 Muscle weakness (generalized): Secondary | ICD-10-CM | POA: Diagnosis not present

## 2022-09-19 DIAGNOSIS — R41841 Cognitive communication deficit: Secondary | ICD-10-CM | POA: Diagnosis not present

## 2022-09-19 DIAGNOSIS — R2689 Other abnormalities of gait and mobility: Secondary | ICD-10-CM | POA: Diagnosis not present

## 2022-09-19 DIAGNOSIS — F339 Major depressive disorder, recurrent, unspecified: Secondary | ICD-10-CM | POA: Diagnosis not present

## 2022-09-19 DIAGNOSIS — F5101 Primary insomnia: Secondary | ICD-10-CM | POA: Diagnosis not present

## 2022-09-19 DIAGNOSIS — M6281 Muscle weakness (generalized): Secondary | ICD-10-CM | POA: Diagnosis not present

## 2022-09-19 DIAGNOSIS — R2681 Unsteadiness on feet: Secondary | ICD-10-CM | POA: Diagnosis not present

## 2022-09-19 DIAGNOSIS — G20C Parkinsonism, unspecified: Secondary | ICD-10-CM | POA: Diagnosis not present

## 2022-09-20 DIAGNOSIS — R41841 Cognitive communication deficit: Secondary | ICD-10-CM | POA: Diagnosis not present

## 2022-09-20 DIAGNOSIS — R2681 Unsteadiness on feet: Secondary | ICD-10-CM | POA: Diagnosis not present

## 2022-09-20 DIAGNOSIS — M6281 Muscle weakness (generalized): Secondary | ICD-10-CM | POA: Diagnosis not present

## 2022-09-20 DIAGNOSIS — G20C Parkinsonism, unspecified: Secondary | ICD-10-CM | POA: Diagnosis not present

## 2022-09-20 DIAGNOSIS — R2689 Other abnormalities of gait and mobility: Secondary | ICD-10-CM | POA: Diagnosis not present

## 2022-09-21 DIAGNOSIS — R2681 Unsteadiness on feet: Secondary | ICD-10-CM | POA: Diagnosis not present

## 2022-09-21 DIAGNOSIS — R41841 Cognitive communication deficit: Secondary | ICD-10-CM | POA: Diagnosis not present

## 2022-09-21 DIAGNOSIS — R2689 Other abnormalities of gait and mobility: Secondary | ICD-10-CM | POA: Diagnosis not present

## 2022-09-21 DIAGNOSIS — G20C Parkinsonism, unspecified: Secondary | ICD-10-CM | POA: Diagnosis not present

## 2022-09-21 DIAGNOSIS — M6281 Muscle weakness (generalized): Secondary | ICD-10-CM | POA: Diagnosis not present

## 2022-09-22 DIAGNOSIS — G20C Parkinsonism, unspecified: Secondary | ICD-10-CM | POA: Diagnosis not present

## 2022-09-22 DIAGNOSIS — M6281 Muscle weakness (generalized): Secondary | ICD-10-CM | POA: Diagnosis not present

## 2022-09-22 DIAGNOSIS — R2681 Unsteadiness on feet: Secondary | ICD-10-CM | POA: Diagnosis not present

## 2022-09-22 DIAGNOSIS — R2689 Other abnormalities of gait and mobility: Secondary | ICD-10-CM | POA: Diagnosis not present

## 2022-09-22 DIAGNOSIS — R41841 Cognitive communication deficit: Secondary | ICD-10-CM | POA: Diagnosis not present

## 2022-09-23 DIAGNOSIS — R2689 Other abnormalities of gait and mobility: Secondary | ICD-10-CM | POA: Diagnosis not present

## 2022-09-23 DIAGNOSIS — F02B2 Dementia in other diseases classified elsewhere, moderate, with psychotic disturbance: Secondary | ICD-10-CM | POA: Diagnosis not present

## 2022-09-23 DIAGNOSIS — G20C Parkinsonism, unspecified: Secondary | ICD-10-CM | POA: Diagnosis not present

## 2022-09-23 DIAGNOSIS — R2681 Unsteadiness on feet: Secondary | ICD-10-CM | POA: Diagnosis not present

## 2022-09-23 DIAGNOSIS — F411 Generalized anxiety disorder: Secondary | ICD-10-CM | POA: Diagnosis not present

## 2022-09-23 DIAGNOSIS — G20B2 Parkinson's disease with dyskinesia, with fluctuations: Secondary | ICD-10-CM | POA: Diagnosis not present

## 2022-09-23 DIAGNOSIS — R443 Hallucinations, unspecified: Secondary | ICD-10-CM | POA: Diagnosis not present

## 2022-09-23 DIAGNOSIS — G301 Alzheimer's disease with late onset: Secondary | ICD-10-CM | POA: Diagnosis not present

## 2022-09-23 DIAGNOSIS — F331 Major depressive disorder, recurrent, moderate: Secondary | ICD-10-CM | POA: Diagnosis not present

## 2022-09-23 DIAGNOSIS — R41841 Cognitive communication deficit: Secondary | ICD-10-CM | POA: Diagnosis not present

## 2022-09-23 DIAGNOSIS — F062 Psychotic disorder with delusions due to known physiological condition: Secondary | ICD-10-CM | POA: Diagnosis not present

## 2022-09-23 DIAGNOSIS — M6281 Muscle weakness (generalized): Secondary | ICD-10-CM | POA: Diagnosis not present

## 2022-09-26 DIAGNOSIS — R2681 Unsteadiness on feet: Secondary | ICD-10-CM | POA: Diagnosis not present

## 2022-09-26 DIAGNOSIS — G20C Parkinsonism, unspecified: Secondary | ICD-10-CM | POA: Diagnosis not present

## 2022-09-26 DIAGNOSIS — R41841 Cognitive communication deficit: Secondary | ICD-10-CM | POA: Diagnosis not present

## 2022-09-26 DIAGNOSIS — R2689 Other abnormalities of gait and mobility: Secondary | ICD-10-CM | POA: Diagnosis not present

## 2022-09-26 DIAGNOSIS — M6281 Muscle weakness (generalized): Secondary | ICD-10-CM | POA: Diagnosis not present

## 2022-09-27 DIAGNOSIS — R2681 Unsteadiness on feet: Secondary | ICD-10-CM | POA: Diagnosis not present

## 2022-09-27 DIAGNOSIS — M6281 Muscle weakness (generalized): Secondary | ICD-10-CM | POA: Diagnosis not present

## 2022-09-27 DIAGNOSIS — R2689 Other abnormalities of gait and mobility: Secondary | ICD-10-CM | POA: Diagnosis not present

## 2022-09-27 DIAGNOSIS — G20C Parkinsonism, unspecified: Secondary | ICD-10-CM | POA: Diagnosis not present

## 2022-09-27 DIAGNOSIS — R41841 Cognitive communication deficit: Secondary | ICD-10-CM | POA: Diagnosis not present

## 2022-09-28 DIAGNOSIS — R2681 Unsteadiness on feet: Secondary | ICD-10-CM | POA: Diagnosis not present

## 2022-09-28 DIAGNOSIS — R2689 Other abnormalities of gait and mobility: Secondary | ICD-10-CM | POA: Diagnosis not present

## 2022-09-28 DIAGNOSIS — M6281 Muscle weakness (generalized): Secondary | ICD-10-CM | POA: Diagnosis not present

## 2022-09-28 DIAGNOSIS — G20C Parkinsonism, unspecified: Secondary | ICD-10-CM | POA: Diagnosis not present

## 2022-09-28 DIAGNOSIS — R41841 Cognitive communication deficit: Secondary | ICD-10-CM | POA: Diagnosis not present

## 2022-09-29 DIAGNOSIS — R2681 Unsteadiness on feet: Secondary | ICD-10-CM | POA: Diagnosis not present

## 2022-09-29 DIAGNOSIS — R41841 Cognitive communication deficit: Secondary | ICD-10-CM | POA: Diagnosis not present

## 2022-09-29 DIAGNOSIS — F331 Major depressive disorder, recurrent, moderate: Secondary | ICD-10-CM | POA: Diagnosis not present

## 2022-09-29 DIAGNOSIS — F411 Generalized anxiety disorder: Secondary | ICD-10-CM | POA: Diagnosis not present

## 2022-09-29 DIAGNOSIS — R2689 Other abnormalities of gait and mobility: Secondary | ICD-10-CM | POA: Diagnosis not present

## 2022-09-29 DIAGNOSIS — M6281 Muscle weakness (generalized): Secondary | ICD-10-CM | POA: Diagnosis not present

## 2022-09-29 DIAGNOSIS — G20C Parkinsonism, unspecified: Secondary | ICD-10-CM | POA: Diagnosis not present

## 2022-09-30 DIAGNOSIS — R2689 Other abnormalities of gait and mobility: Secondary | ICD-10-CM | POA: Diagnosis not present

## 2022-09-30 DIAGNOSIS — R41841 Cognitive communication deficit: Secondary | ICD-10-CM | POA: Diagnosis not present

## 2022-09-30 DIAGNOSIS — R2681 Unsteadiness on feet: Secondary | ICD-10-CM | POA: Diagnosis not present

## 2022-09-30 DIAGNOSIS — M6281 Muscle weakness (generalized): Secondary | ICD-10-CM | POA: Diagnosis not present

## 2022-09-30 DIAGNOSIS — G20C Parkinsonism, unspecified: Secondary | ICD-10-CM | POA: Diagnosis not present

## 2022-10-03 DIAGNOSIS — R2689 Other abnormalities of gait and mobility: Secondary | ICD-10-CM | POA: Diagnosis not present

## 2022-10-03 DIAGNOSIS — R41841 Cognitive communication deficit: Secondary | ICD-10-CM | POA: Diagnosis not present

## 2022-10-03 DIAGNOSIS — R2681 Unsteadiness on feet: Secondary | ICD-10-CM | POA: Diagnosis not present

## 2022-10-03 DIAGNOSIS — M6281 Muscle weakness (generalized): Secondary | ICD-10-CM | POA: Diagnosis not present

## 2022-10-03 DIAGNOSIS — G20C Parkinsonism, unspecified: Secondary | ICD-10-CM | POA: Diagnosis not present

## 2022-10-04 DIAGNOSIS — G20C Parkinsonism, unspecified: Secondary | ICD-10-CM | POA: Diagnosis not present

## 2022-10-04 DIAGNOSIS — M6281 Muscle weakness (generalized): Secondary | ICD-10-CM | POA: Diagnosis not present

## 2022-10-04 DIAGNOSIS — R2681 Unsteadiness on feet: Secondary | ICD-10-CM | POA: Diagnosis not present

## 2022-10-04 DIAGNOSIS — R2689 Other abnormalities of gait and mobility: Secondary | ICD-10-CM | POA: Diagnosis not present

## 2022-10-04 DIAGNOSIS — R41841 Cognitive communication deficit: Secondary | ICD-10-CM | POA: Diagnosis not present

## 2022-10-05 ENCOUNTER — Encounter: Payer: Self-pay | Admitting: Physician Assistant

## 2022-10-05 DIAGNOSIS — G20C Parkinsonism, unspecified: Secondary | ICD-10-CM | POA: Diagnosis not present

## 2022-10-05 DIAGNOSIS — M6281 Muscle weakness (generalized): Secondary | ICD-10-CM | POA: Diagnosis not present

## 2022-10-05 DIAGNOSIS — R2681 Unsteadiness on feet: Secondary | ICD-10-CM | POA: Diagnosis not present

## 2022-10-05 DIAGNOSIS — R41841 Cognitive communication deficit: Secondary | ICD-10-CM | POA: Diagnosis not present

## 2022-10-05 DIAGNOSIS — R2689 Other abnormalities of gait and mobility: Secondary | ICD-10-CM | POA: Diagnosis not present

## 2022-10-06 DIAGNOSIS — R2681 Unsteadiness on feet: Secondary | ICD-10-CM | POA: Diagnosis not present

## 2022-10-06 DIAGNOSIS — R2689 Other abnormalities of gait and mobility: Secondary | ICD-10-CM | POA: Diagnosis not present

## 2022-10-06 DIAGNOSIS — G20C Parkinsonism, unspecified: Secondary | ICD-10-CM | POA: Diagnosis not present

## 2022-10-06 DIAGNOSIS — M6281 Muscle weakness (generalized): Secondary | ICD-10-CM | POA: Diagnosis not present

## 2022-10-06 DIAGNOSIS — R41841 Cognitive communication deficit: Secondary | ICD-10-CM | POA: Diagnosis not present

## 2022-10-06 NOTE — Telephone Encounter (Signed)
I contacted pharmacy and had medication d'ced. Will my chart to patient.

## 2022-10-07 DIAGNOSIS — R2681 Unsteadiness on feet: Secondary | ICD-10-CM | POA: Diagnosis not present

## 2022-10-07 DIAGNOSIS — G20C Parkinsonism, unspecified: Secondary | ICD-10-CM | POA: Diagnosis not present

## 2022-10-07 DIAGNOSIS — R41841 Cognitive communication deficit: Secondary | ICD-10-CM | POA: Diagnosis not present

## 2022-10-07 DIAGNOSIS — R2689 Other abnormalities of gait and mobility: Secondary | ICD-10-CM | POA: Diagnosis not present

## 2022-10-07 DIAGNOSIS — M6281 Muscle weakness (generalized): Secondary | ICD-10-CM | POA: Diagnosis not present

## 2022-10-10 DIAGNOSIS — R41841 Cognitive communication deficit: Secondary | ICD-10-CM | POA: Diagnosis not present

## 2022-10-10 DIAGNOSIS — M6281 Muscle weakness (generalized): Secondary | ICD-10-CM | POA: Diagnosis not present

## 2022-10-10 DIAGNOSIS — G20C Parkinsonism, unspecified: Secondary | ICD-10-CM | POA: Diagnosis not present

## 2022-10-10 DIAGNOSIS — Z23 Encounter for immunization: Secondary | ICD-10-CM | POA: Diagnosis not present

## 2022-10-10 DIAGNOSIS — R2689 Other abnormalities of gait and mobility: Secondary | ICD-10-CM | POA: Diagnosis not present

## 2022-10-10 DIAGNOSIS — R2681 Unsteadiness on feet: Secondary | ICD-10-CM | POA: Diagnosis not present

## 2022-10-11 DIAGNOSIS — G20C Parkinsonism, unspecified: Secondary | ICD-10-CM | POA: Diagnosis not present

## 2022-10-11 DIAGNOSIS — M6281 Muscle weakness (generalized): Secondary | ICD-10-CM | POA: Diagnosis not present

## 2022-10-11 DIAGNOSIS — R2681 Unsteadiness on feet: Secondary | ICD-10-CM | POA: Diagnosis not present

## 2022-10-11 DIAGNOSIS — R41841 Cognitive communication deficit: Secondary | ICD-10-CM | POA: Diagnosis not present

## 2022-10-11 DIAGNOSIS — R2689 Other abnormalities of gait and mobility: Secondary | ICD-10-CM | POA: Diagnosis not present

## 2022-10-12 DIAGNOSIS — G20C Parkinsonism, unspecified: Secondary | ICD-10-CM | POA: Diagnosis not present

## 2022-10-12 DIAGNOSIS — M6281 Muscle weakness (generalized): Secondary | ICD-10-CM | POA: Diagnosis not present

## 2022-10-12 DIAGNOSIS — R41841 Cognitive communication deficit: Secondary | ICD-10-CM | POA: Diagnosis not present

## 2022-10-13 DIAGNOSIS — G20C Parkinsonism, unspecified: Secondary | ICD-10-CM | POA: Diagnosis not present

## 2022-10-13 DIAGNOSIS — R41841 Cognitive communication deficit: Secondary | ICD-10-CM | POA: Diagnosis not present

## 2022-10-13 DIAGNOSIS — M6281 Muscle weakness (generalized): Secondary | ICD-10-CM | POA: Diagnosis not present

## 2022-10-14 DIAGNOSIS — G20C Parkinsonism, unspecified: Secondary | ICD-10-CM | POA: Diagnosis not present

## 2022-10-14 DIAGNOSIS — R41841 Cognitive communication deficit: Secondary | ICD-10-CM | POA: Diagnosis not present

## 2022-10-14 DIAGNOSIS — M6281 Muscle weakness (generalized): Secondary | ICD-10-CM | POA: Diagnosis not present

## 2022-10-17 DIAGNOSIS — M6281 Muscle weakness (generalized): Secondary | ICD-10-CM | POA: Diagnosis not present

## 2022-10-17 DIAGNOSIS — R41841 Cognitive communication deficit: Secondary | ICD-10-CM | POA: Diagnosis not present

## 2022-10-17 DIAGNOSIS — G20C Parkinsonism, unspecified: Secondary | ICD-10-CM | POA: Diagnosis not present

## 2022-10-18 DIAGNOSIS — M6281 Muscle weakness (generalized): Secondary | ICD-10-CM | POA: Diagnosis not present

## 2022-10-18 DIAGNOSIS — R41841 Cognitive communication deficit: Secondary | ICD-10-CM | POA: Diagnosis not present

## 2022-10-18 DIAGNOSIS — G20C Parkinsonism, unspecified: Secondary | ICD-10-CM | POA: Diagnosis not present

## 2022-10-20 DIAGNOSIS — R41841 Cognitive communication deficit: Secondary | ICD-10-CM | POA: Diagnosis not present

## 2022-10-20 DIAGNOSIS — M6281 Muscle weakness (generalized): Secondary | ICD-10-CM | POA: Diagnosis not present

## 2022-10-20 DIAGNOSIS — G20C Parkinsonism, unspecified: Secondary | ICD-10-CM | POA: Diagnosis not present

## 2022-10-21 DIAGNOSIS — F411 Generalized anxiety disorder: Secondary | ICD-10-CM | POA: Diagnosis not present

## 2022-10-21 DIAGNOSIS — R41841 Cognitive communication deficit: Secondary | ICD-10-CM | POA: Diagnosis not present

## 2022-10-21 DIAGNOSIS — N390 Urinary tract infection, site not specified: Secondary | ICD-10-CM | POA: Diagnosis not present

## 2022-10-21 DIAGNOSIS — Z87440 Personal history of urinary (tract) infections: Secondary | ICD-10-CM | POA: Diagnosis not present

## 2022-10-21 DIAGNOSIS — M6281 Muscle weakness (generalized): Secondary | ICD-10-CM | POA: Diagnosis not present

## 2022-10-21 DIAGNOSIS — G20B2 Parkinson's disease with dyskinesia, with fluctuations: Secondary | ICD-10-CM | POA: Diagnosis not present

## 2022-10-21 DIAGNOSIS — R443 Hallucinations, unspecified: Secondary | ICD-10-CM | POA: Diagnosis not present

## 2022-10-21 DIAGNOSIS — G20C Parkinsonism, unspecified: Secondary | ICD-10-CM | POA: Diagnosis not present

## 2022-10-21 DIAGNOSIS — F02B2 Dementia in other diseases classified elsewhere, moderate, with psychotic disturbance: Secondary | ICD-10-CM | POA: Diagnosis not present

## 2022-10-21 DIAGNOSIS — G301 Alzheimer's disease with late onset: Secondary | ICD-10-CM | POA: Diagnosis not present

## 2022-10-21 DIAGNOSIS — F331 Major depressive disorder, recurrent, moderate: Secondary | ICD-10-CM | POA: Diagnosis not present

## 2022-10-25 DIAGNOSIS — R41841 Cognitive communication deficit: Secondary | ICD-10-CM | POA: Diagnosis not present

## 2022-10-25 DIAGNOSIS — M6281 Muscle weakness (generalized): Secondary | ICD-10-CM | POA: Diagnosis not present

## 2022-10-25 DIAGNOSIS — G20C Parkinsonism, unspecified: Secondary | ICD-10-CM | POA: Diagnosis not present

## 2022-10-26 DIAGNOSIS — R41841 Cognitive communication deficit: Secondary | ICD-10-CM | POA: Diagnosis not present

## 2022-10-26 DIAGNOSIS — M6281 Muscle weakness (generalized): Secondary | ICD-10-CM | POA: Diagnosis not present

## 2022-10-26 DIAGNOSIS — G20C Parkinsonism, unspecified: Secondary | ICD-10-CM | POA: Diagnosis not present

## 2022-10-27 DIAGNOSIS — M6281 Muscle weakness (generalized): Secondary | ICD-10-CM | POA: Diagnosis not present

## 2022-10-27 DIAGNOSIS — R41841 Cognitive communication deficit: Secondary | ICD-10-CM | POA: Diagnosis not present

## 2022-10-27 DIAGNOSIS — G20C Parkinsonism, unspecified: Secondary | ICD-10-CM | POA: Diagnosis not present

## 2022-10-28 ENCOUNTER — Telehealth: Payer: Self-pay | Admitting: *Deleted

## 2022-10-28 DIAGNOSIS — G20C Parkinsonism, unspecified: Secondary | ICD-10-CM | POA: Diagnosis not present

## 2022-10-28 DIAGNOSIS — M6281 Muscle weakness (generalized): Secondary | ICD-10-CM | POA: Diagnosis not present

## 2022-10-28 DIAGNOSIS — R41841 Cognitive communication deficit: Secondary | ICD-10-CM | POA: Diagnosis not present

## 2022-10-28 NOTE — Patient Outreach (Signed)
  Care Coordination   10/28/2022 Name: [name] MRN: [id number] DOB: 20-Sep-1936   Care Coordination Outreach Attempts:  An unsuccessful telephone outreach was attempted today to offer the patient information about available care coordination services as a benefit of their health plan.   Follow Up Plan:  Additional outreach attempts will be made to offer the patient care coordination information and services.   Encounter Outcome:  No Answer  Care Coordination Interventions Activated:  No   Care Coordination Interventions:  No, not indicated    Raina Mina, RN Care Management Coordinator [place] Office [phone]

## 2022-10-31 DIAGNOSIS — G20B2 Parkinson's disease with dyskinesia, with fluctuations: Secondary | ICD-10-CM | POA: Diagnosis not present

## 2022-10-31 DIAGNOSIS — F339 Major depressive disorder, recurrent, unspecified: Secondary | ICD-10-CM | POA: Diagnosis not present

## 2022-10-31 DIAGNOSIS — F5101 Primary insomnia: Secondary | ICD-10-CM | POA: Diagnosis not present

## 2022-10-31 DIAGNOSIS — J309 Allergic rhinitis, unspecified: Secondary | ICD-10-CM | POA: Diagnosis not present

## 2022-10-31 DIAGNOSIS — G20C Parkinsonism, unspecified: Secondary | ICD-10-CM | POA: Diagnosis not present

## 2022-10-31 DIAGNOSIS — R41841 Cognitive communication deficit: Secondary | ICD-10-CM | POA: Diagnosis not present

## 2022-10-31 DIAGNOSIS — G301 Alzheimer's disease with late onset: Secondary | ICD-10-CM | POA: Diagnosis not present

## 2022-10-31 DIAGNOSIS — M6281 Muscle weakness (generalized): Secondary | ICD-10-CM | POA: Diagnosis not present

## 2022-10-31 DIAGNOSIS — F02B2 Dementia in other diseases classified elsewhere, moderate, with psychotic disturbance: Secondary | ICD-10-CM | POA: Diagnosis not present

## 2022-11-01 DIAGNOSIS — M6281 Muscle weakness (generalized): Secondary | ICD-10-CM | POA: Diagnosis not present

## 2022-11-01 DIAGNOSIS — F331 Major depressive disorder, recurrent, moderate: Secondary | ICD-10-CM | POA: Diagnosis not present

## 2022-11-01 DIAGNOSIS — R41841 Cognitive communication deficit: Secondary | ICD-10-CM | POA: Diagnosis not present

## 2022-11-01 DIAGNOSIS — F411 Generalized anxiety disorder: Secondary | ICD-10-CM | POA: Diagnosis not present

## 2022-11-01 DIAGNOSIS — G20C Parkinsonism, unspecified: Secondary | ICD-10-CM | POA: Diagnosis not present

## 2022-11-02 DIAGNOSIS — R41841 Cognitive communication deficit: Secondary | ICD-10-CM | POA: Diagnosis not present

## 2022-11-02 DIAGNOSIS — M6281 Muscle weakness (generalized): Secondary | ICD-10-CM | POA: Diagnosis not present

## 2022-11-02 DIAGNOSIS — G20C Parkinsonism, unspecified: Secondary | ICD-10-CM | POA: Diagnosis not present

## 2022-11-04 DIAGNOSIS — G20C Parkinsonism, unspecified: Secondary | ICD-10-CM | POA: Diagnosis not present

## 2022-11-04 DIAGNOSIS — R41841 Cognitive communication deficit: Secondary | ICD-10-CM | POA: Diagnosis not present

## 2022-11-04 DIAGNOSIS — M6281 Muscle weakness (generalized): Secondary | ICD-10-CM | POA: Diagnosis not present

## 2022-11-07 DIAGNOSIS — G20C Parkinsonism, unspecified: Secondary | ICD-10-CM | POA: Diagnosis not present

## 2022-11-07 DIAGNOSIS — R41841 Cognitive communication deficit: Secondary | ICD-10-CM | POA: Diagnosis not present

## 2022-11-07 DIAGNOSIS — M6281 Muscle weakness (generalized): Secondary | ICD-10-CM | POA: Diagnosis not present

## 2022-11-09 DIAGNOSIS — R41841 Cognitive communication deficit: Secondary | ICD-10-CM | POA: Diagnosis not present

## 2022-11-09 DIAGNOSIS — M6281 Muscle weakness (generalized): Secondary | ICD-10-CM | POA: Diagnosis not present

## 2022-11-09 DIAGNOSIS — G20C Parkinsonism, unspecified: Secondary | ICD-10-CM | POA: Diagnosis not present

## 2022-11-10 DIAGNOSIS — G20C Parkinsonism, unspecified: Secondary | ICD-10-CM | POA: Diagnosis not present

## 2022-11-10 DIAGNOSIS — M6281 Muscle weakness (generalized): Secondary | ICD-10-CM | POA: Diagnosis not present

## 2022-11-10 DIAGNOSIS — R41841 Cognitive communication deficit: Secondary | ICD-10-CM | POA: Diagnosis not present

## 2022-11-11 DIAGNOSIS — R41841 Cognitive communication deficit: Secondary | ICD-10-CM | POA: Diagnosis not present

## 2022-11-11 DIAGNOSIS — M6281 Muscle weakness (generalized): Secondary | ICD-10-CM | POA: Diagnosis not present

## 2022-11-11 DIAGNOSIS — R488 Other symbolic dysfunctions: Secondary | ICD-10-CM | POA: Diagnosis not present

## 2022-11-11 DIAGNOSIS — G20C Parkinsonism, unspecified: Secondary | ICD-10-CM | POA: Diagnosis not present

## 2022-11-14 DIAGNOSIS — M6281 Muscle weakness (generalized): Secondary | ICD-10-CM | POA: Diagnosis not present

## 2022-11-14 DIAGNOSIS — S0180XA Unspecified open wound of other part of head, initial encounter: Secondary | ICD-10-CM | POA: Diagnosis not present

## 2022-11-14 DIAGNOSIS — G20C Parkinsonism, unspecified: Secondary | ICD-10-CM | POA: Diagnosis not present

## 2022-11-14 DIAGNOSIS — R41841 Cognitive communication deficit: Secondary | ICD-10-CM | POA: Diagnosis not present

## 2022-11-14 DIAGNOSIS — R488 Other symbolic dysfunctions: Secondary | ICD-10-CM | POA: Diagnosis not present

## 2022-11-17 DIAGNOSIS — R488 Other symbolic dysfunctions: Secondary | ICD-10-CM | POA: Diagnosis not present

## 2022-11-17 DIAGNOSIS — M6281 Muscle weakness (generalized): Secondary | ICD-10-CM | POA: Diagnosis not present

## 2022-11-17 DIAGNOSIS — G20C Parkinsonism, unspecified: Secondary | ICD-10-CM | POA: Diagnosis not present

## 2022-11-17 DIAGNOSIS — R41841 Cognitive communication deficit: Secondary | ICD-10-CM | POA: Diagnosis not present

## 2022-11-18 DIAGNOSIS — M6281 Muscle weakness (generalized): Secondary | ICD-10-CM | POA: Diagnosis not present

## 2022-11-18 DIAGNOSIS — R41841 Cognitive communication deficit: Secondary | ICD-10-CM | POA: Diagnosis not present

## 2022-11-18 DIAGNOSIS — R488 Other symbolic dysfunctions: Secondary | ICD-10-CM | POA: Diagnosis not present

## 2022-11-18 DIAGNOSIS — G20C Parkinsonism, unspecified: Secondary | ICD-10-CM | POA: Diagnosis not present

## 2022-11-18 DIAGNOSIS — G20B2 Parkinson's disease with dyskinesia, with fluctuations: Secondary | ICD-10-CM | POA: Diagnosis not present

## 2022-11-18 DIAGNOSIS — F02B2 Dementia in other diseases classified elsewhere, moderate, with psychotic disturbance: Secondary | ICD-10-CM | POA: Diagnosis not present

## 2022-11-18 DIAGNOSIS — F339 Major depressive disorder, recurrent, unspecified: Secondary | ICD-10-CM | POA: Diagnosis not present

## 2022-11-21 DIAGNOSIS — U071 COVID-19: Secondary | ICD-10-CM | POA: Diagnosis not present

## 2022-11-25 DIAGNOSIS — M6281 Muscle weakness (generalized): Secondary | ICD-10-CM | POA: Diagnosis not present

## 2022-11-25 DIAGNOSIS — R41841 Cognitive communication deficit: Secondary | ICD-10-CM | POA: Diagnosis not present

## 2022-11-25 DIAGNOSIS — G20C Parkinsonism, unspecified: Secondary | ICD-10-CM | POA: Diagnosis not present

## 2022-11-25 DIAGNOSIS — R488 Other symbolic dysfunctions: Secondary | ICD-10-CM | POA: Diagnosis not present

## 2022-11-28 DIAGNOSIS — G20C Parkinsonism, unspecified: Secondary | ICD-10-CM | POA: Diagnosis not present

## 2022-11-28 DIAGNOSIS — F339 Major depressive disorder, recurrent, unspecified: Secondary | ICD-10-CM | POA: Diagnosis not present

## 2022-11-28 DIAGNOSIS — G20B2 Parkinson's disease with dyskinesia, with fluctuations: Secondary | ICD-10-CM | POA: Diagnosis not present

## 2022-11-28 DIAGNOSIS — R488 Other symbolic dysfunctions: Secondary | ICD-10-CM | POA: Diagnosis not present

## 2022-11-28 DIAGNOSIS — R41841 Cognitive communication deficit: Secondary | ICD-10-CM | POA: Diagnosis not present

## 2022-11-28 DIAGNOSIS — M6281 Muscle weakness (generalized): Secondary | ICD-10-CM | POA: Diagnosis not present

## 2022-11-28 DIAGNOSIS — F03C0 Unspecified dementia, severe, without behavioral disturbance, psychotic disturbance, mood disturbance, and anxiety: Secondary | ICD-10-CM | POA: Diagnosis not present

## 2022-12-07 DIAGNOSIS — Z87440 Personal history of urinary (tract) infections: Secondary | ICD-10-CM | POA: Diagnosis not present

## 2022-12-07 DIAGNOSIS — R41841 Cognitive communication deficit: Secondary | ICD-10-CM | POA: Diagnosis not present

## 2022-12-07 DIAGNOSIS — G3183 Dementia with Lewy bodies: Secondary | ICD-10-CM | POA: Diagnosis not present

## 2022-12-07 DIAGNOSIS — G20B1 Parkinson's disease with dyskinesia, without mention of fluctuations: Secondary | ICD-10-CM | POA: Diagnosis not present

## 2022-12-07 DIAGNOSIS — R488 Other symbolic dysfunctions: Secondary | ICD-10-CM | POA: Diagnosis not present

## 2022-12-07 DIAGNOSIS — M6281 Muscle weakness (generalized): Secondary | ICD-10-CM | POA: Diagnosis not present

## 2022-12-07 DIAGNOSIS — G20C Parkinsonism, unspecified: Secondary | ICD-10-CM | POA: Diagnosis not present

## 2022-12-07 DIAGNOSIS — F331 Major depressive disorder, recurrent, moderate: Secondary | ICD-10-CM | POA: Diagnosis not present

## 2022-12-09 DIAGNOSIS — R41841 Cognitive communication deficit: Secondary | ICD-10-CM | POA: Diagnosis not present

## 2022-12-09 DIAGNOSIS — R488 Other symbolic dysfunctions: Secondary | ICD-10-CM | POA: Diagnosis not present

## 2022-12-09 DIAGNOSIS — M6281 Muscle weakness (generalized): Secondary | ICD-10-CM | POA: Diagnosis not present

## 2022-12-09 DIAGNOSIS — G20C Parkinsonism, unspecified: Secondary | ICD-10-CM | POA: Diagnosis not present

## 2022-12-14 DIAGNOSIS — F5101 Primary insomnia: Secondary | ICD-10-CM | POA: Diagnosis not present

## 2022-12-14 DIAGNOSIS — F02B2 Dementia in other diseases classified elsewhere, moderate, with psychotic disturbance: Secondary | ICD-10-CM | POA: Diagnosis not present

## 2022-12-14 DIAGNOSIS — F331 Major depressive disorder, recurrent, moderate: Secondary | ICD-10-CM | POA: Diagnosis not present

## 2022-12-14 DIAGNOSIS — M6281 Muscle weakness (generalized): Secondary | ICD-10-CM | POA: Diagnosis not present

## 2022-12-14 DIAGNOSIS — R41841 Cognitive communication deficit: Secondary | ICD-10-CM | POA: Diagnosis not present

## 2022-12-14 DIAGNOSIS — G20C Parkinsonism, unspecified: Secondary | ICD-10-CM | POA: Diagnosis not present

## 2022-12-14 DIAGNOSIS — G3183 Dementia with Lewy bodies: Secondary | ICD-10-CM | POA: Diagnosis not present

## 2022-12-14 DIAGNOSIS — R488 Other symbolic dysfunctions: Secondary | ICD-10-CM | POA: Diagnosis not present

## 2022-12-15 DIAGNOSIS — M6281 Muscle weakness (generalized): Secondary | ICD-10-CM | POA: Diagnosis not present

## 2022-12-15 DIAGNOSIS — R41841 Cognitive communication deficit: Secondary | ICD-10-CM | POA: Diagnosis not present

## 2022-12-15 DIAGNOSIS — G20C Parkinsonism, unspecified: Secondary | ICD-10-CM | POA: Diagnosis not present

## 2022-12-15 DIAGNOSIS — R488 Other symbolic dysfunctions: Secondary | ICD-10-CM | POA: Diagnosis not present

## 2022-12-16 DIAGNOSIS — G20C Parkinsonism, unspecified: Secondary | ICD-10-CM | POA: Diagnosis not present

## 2022-12-16 DIAGNOSIS — R488 Other symbolic dysfunctions: Secondary | ICD-10-CM | POA: Diagnosis not present

## 2022-12-16 DIAGNOSIS — M6281 Muscle weakness (generalized): Secondary | ICD-10-CM | POA: Diagnosis not present

## 2022-12-16 DIAGNOSIS — R41841 Cognitive communication deficit: Secondary | ICD-10-CM | POA: Diagnosis not present

## 2022-12-19 DIAGNOSIS — G20B1 Parkinson's disease with dyskinesia, without mention of fluctuations: Secondary | ICD-10-CM | POA: Diagnosis not present

## 2022-12-19 DIAGNOSIS — F339 Major depressive disorder, recurrent, unspecified: Secondary | ICD-10-CM | POA: Diagnosis not present

## 2022-12-19 DIAGNOSIS — W010XXA Fall on same level from slipping, tripping and stumbling without subsequent striking against object, initial encounter: Secondary | ICD-10-CM | POA: Diagnosis not present

## 2022-12-20 DIAGNOSIS — M6281 Muscle weakness (generalized): Secondary | ICD-10-CM | POA: Diagnosis not present

## 2022-12-20 DIAGNOSIS — R488 Other symbolic dysfunctions: Secondary | ICD-10-CM | POA: Diagnosis not present

## 2022-12-20 DIAGNOSIS — G20C Parkinsonism, unspecified: Secondary | ICD-10-CM | POA: Diagnosis not present

## 2022-12-20 DIAGNOSIS — R41841 Cognitive communication deficit: Secondary | ICD-10-CM | POA: Diagnosis not present

## 2022-12-22 DIAGNOSIS — R488 Other symbolic dysfunctions: Secondary | ICD-10-CM | POA: Diagnosis not present

## 2022-12-22 DIAGNOSIS — G20C Parkinsonism, unspecified: Secondary | ICD-10-CM | POA: Diagnosis not present

## 2022-12-22 DIAGNOSIS — R41841 Cognitive communication deficit: Secondary | ICD-10-CM | POA: Diagnosis not present

## 2022-12-22 DIAGNOSIS — M6281 Muscle weakness (generalized): Secondary | ICD-10-CM | POA: Diagnosis not present

## 2022-12-23 DIAGNOSIS — R488 Other symbolic dysfunctions: Secondary | ICD-10-CM | POA: Diagnosis not present

## 2022-12-23 DIAGNOSIS — M6281 Muscle weakness (generalized): Secondary | ICD-10-CM | POA: Diagnosis not present

## 2022-12-23 DIAGNOSIS — G20C Parkinsonism, unspecified: Secondary | ICD-10-CM | POA: Diagnosis not present

## 2022-12-23 DIAGNOSIS — R41841 Cognitive communication deficit: Secondary | ICD-10-CM | POA: Diagnosis not present

## 2022-12-26 DIAGNOSIS — G20C Parkinsonism, unspecified: Secondary | ICD-10-CM | POA: Diagnosis not present

## 2022-12-26 DIAGNOSIS — R488 Other symbolic dysfunctions: Secondary | ICD-10-CM | POA: Diagnosis not present

## 2022-12-26 DIAGNOSIS — R41841 Cognitive communication deficit: Secondary | ICD-10-CM | POA: Diagnosis not present

## 2022-12-26 DIAGNOSIS — M6281 Muscle weakness (generalized): Secondary | ICD-10-CM | POA: Diagnosis not present

## 2022-12-28 DIAGNOSIS — R488 Other symbolic dysfunctions: Secondary | ICD-10-CM | POA: Diagnosis not present

## 2022-12-28 DIAGNOSIS — R41841 Cognitive communication deficit: Secondary | ICD-10-CM | POA: Diagnosis not present

## 2022-12-28 DIAGNOSIS — M6281 Muscle weakness (generalized): Secondary | ICD-10-CM | POA: Diagnosis not present

## 2022-12-28 DIAGNOSIS — G20C Parkinsonism, unspecified: Secondary | ICD-10-CM | POA: Diagnosis not present

## 2022-12-30 DIAGNOSIS — R41841 Cognitive communication deficit: Secondary | ICD-10-CM | POA: Diagnosis not present

## 2022-12-30 DIAGNOSIS — R488 Other symbolic dysfunctions: Secondary | ICD-10-CM | POA: Diagnosis not present

## 2022-12-30 DIAGNOSIS — G20C Parkinsonism, unspecified: Secondary | ICD-10-CM | POA: Diagnosis not present

## 2022-12-30 DIAGNOSIS — M6281 Muscle weakness (generalized): Secondary | ICD-10-CM | POA: Diagnosis not present

## 2023-01-02 DIAGNOSIS — R488 Other symbolic dysfunctions: Secondary | ICD-10-CM | POA: Diagnosis not present

## 2023-01-02 DIAGNOSIS — M6281 Muscle weakness (generalized): Secondary | ICD-10-CM | POA: Diagnosis not present

## 2023-01-02 DIAGNOSIS — G20C Parkinsonism, unspecified: Secondary | ICD-10-CM | POA: Diagnosis not present

## 2023-01-02 DIAGNOSIS — R41841 Cognitive communication deficit: Secondary | ICD-10-CM | POA: Diagnosis not present

## 2023-01-04 DIAGNOSIS — R41841 Cognitive communication deficit: Secondary | ICD-10-CM | POA: Diagnosis not present

## 2023-01-04 DIAGNOSIS — M6281 Muscle weakness (generalized): Secondary | ICD-10-CM | POA: Diagnosis not present

## 2023-01-04 DIAGNOSIS — G20C Parkinsonism, unspecified: Secondary | ICD-10-CM | POA: Diagnosis not present

## 2023-01-04 DIAGNOSIS — R488 Other symbolic dysfunctions: Secondary | ICD-10-CM | POA: Diagnosis not present

## 2023-01-09 DIAGNOSIS — M6281 Muscle weakness (generalized): Secondary | ICD-10-CM | POA: Diagnosis not present

## 2023-01-09 DIAGNOSIS — R41841 Cognitive communication deficit: Secondary | ICD-10-CM | POA: Diagnosis not present

## 2023-01-09 DIAGNOSIS — R488 Other symbolic dysfunctions: Secondary | ICD-10-CM | POA: Diagnosis not present

## 2023-01-09 DIAGNOSIS — G20C Parkinsonism, unspecified: Secondary | ICD-10-CM | POA: Diagnosis not present

## 2023-01-11 DIAGNOSIS — R41841 Cognitive communication deficit: Secondary | ICD-10-CM | POA: Diagnosis not present

## 2023-01-11 DIAGNOSIS — G20C Parkinsonism, unspecified: Secondary | ICD-10-CM | POA: Diagnosis not present

## 2023-01-11 DIAGNOSIS — G3183 Dementia with Lewy bodies: Secondary | ICD-10-CM | POA: Diagnosis not present

## 2023-01-11 DIAGNOSIS — H409 Unspecified glaucoma: Secondary | ICD-10-CM | POA: Diagnosis not present

## 2023-01-11 DIAGNOSIS — M6281 Muscle weakness (generalized): Secondary | ICD-10-CM | POA: Diagnosis not present

## 2023-01-11 DIAGNOSIS — R488 Other symbolic dysfunctions: Secondary | ICD-10-CM | POA: Diagnosis not present

## 2023-01-13 DIAGNOSIS — G20B1 Parkinson's disease with dyskinesia, without mention of fluctuations: Secondary | ICD-10-CM | POA: Diagnosis not present

## 2023-01-13 DIAGNOSIS — F02B2 Dementia in other diseases classified elsewhere, moderate, with psychotic disturbance: Secondary | ICD-10-CM | POA: Diagnosis not present

## 2023-01-13 DIAGNOSIS — F331 Major depressive disorder, recurrent, moderate: Secondary | ICD-10-CM | POA: Diagnosis not present

## 2023-01-13 DIAGNOSIS — G3183 Dementia with Lewy bodies: Secondary | ICD-10-CM | POA: Diagnosis not present

## 2023-01-13 DIAGNOSIS — F5101 Primary insomnia: Secondary | ICD-10-CM | POA: Diagnosis not present

## 2023-01-16 DIAGNOSIS — G20C Parkinsonism, unspecified: Secondary | ICD-10-CM | POA: Diagnosis not present

## 2023-01-16 DIAGNOSIS — G20B1 Parkinson's disease with dyskinesia, without mention of fluctuations: Secondary | ICD-10-CM | POA: Diagnosis not present

## 2023-01-16 DIAGNOSIS — M6281 Muscle weakness (generalized): Secondary | ICD-10-CM | POA: Diagnosis not present

## 2023-01-16 DIAGNOSIS — H409 Unspecified glaucoma: Secondary | ICD-10-CM | POA: Diagnosis not present

## 2023-01-16 DIAGNOSIS — F339 Major depressive disorder, recurrent, unspecified: Secondary | ICD-10-CM | POA: Diagnosis not present

## 2023-01-16 DIAGNOSIS — R488 Other symbolic dysfunctions: Secondary | ICD-10-CM | POA: Diagnosis not present

## 2023-01-18 DIAGNOSIS — R488 Other symbolic dysfunctions: Secondary | ICD-10-CM | POA: Diagnosis not present

## 2023-01-18 DIAGNOSIS — G20C Parkinsonism, unspecified: Secondary | ICD-10-CM | POA: Diagnosis not present

## 2023-01-18 DIAGNOSIS — M6281 Muscle weakness (generalized): Secondary | ICD-10-CM | POA: Diagnosis not present

## 2023-01-23 DIAGNOSIS — I10 Essential (primary) hypertension: Secondary | ICD-10-CM | POA: Diagnosis not present

## 2023-01-23 DIAGNOSIS — F062 Psychotic disorder with delusions due to known physiological condition: Secondary | ICD-10-CM | POA: Diagnosis not present

## 2023-01-23 DIAGNOSIS — F411 Generalized anxiety disorder: Secondary | ICD-10-CM | POA: Diagnosis not present

## 2023-01-23 DIAGNOSIS — R443 Hallucinations, unspecified: Secondary | ICD-10-CM | POA: Diagnosis not present

## 2023-01-23 DIAGNOSIS — G20C Parkinsonism, unspecified: Secondary | ICD-10-CM | POA: Diagnosis not present

## 2023-01-23 DIAGNOSIS — F0392 Unspecified dementia, unspecified severity, with psychotic disturbance: Secondary | ICD-10-CM | POA: Diagnosis not present

## 2023-01-23 DIAGNOSIS — R488 Other symbolic dysfunctions: Secondary | ICD-10-CM | POA: Diagnosis not present

## 2023-01-23 DIAGNOSIS — M6281 Muscle weakness (generalized): Secondary | ICD-10-CM | POA: Diagnosis not present

## 2023-02-07 DIAGNOSIS — Z79899 Other long term (current) drug therapy: Secondary | ICD-10-CM | POA: Diagnosis not present

## 2023-02-07 DIAGNOSIS — H409 Unspecified glaucoma: Secondary | ICD-10-CM | POA: Diagnosis not present

## 2023-02-07 DIAGNOSIS — G3183 Dementia with Lewy bodies: Secondary | ICD-10-CM | POA: Diagnosis not present

## 2023-02-12 DIAGNOSIS — D72829 Elevated white blood cell count, unspecified: Secondary | ICD-10-CM | POA: Diagnosis not present

## 2023-02-13 DIAGNOSIS — W010XXA Fall on same level from slipping, tripping and stumbling without subsequent striking against object, initial encounter: Secondary | ICD-10-CM | POA: Diagnosis not present

## 2023-02-13 DIAGNOSIS — F5101 Primary insomnia: Secondary | ICD-10-CM | POA: Diagnosis not present

## 2023-02-13 DIAGNOSIS — Z87440 Personal history of urinary (tract) infections: Secondary | ICD-10-CM | POA: Diagnosis not present

## 2023-02-13 DIAGNOSIS — G20C Parkinsonism, unspecified: Secondary | ICD-10-CM | POA: Diagnosis not present

## 2023-02-16 DIAGNOSIS — B351 Tinea unguium: Secondary | ICD-10-CM | POA: Diagnosis not present

## 2023-02-16 DIAGNOSIS — M2041 Other hammer toe(s) (acquired), right foot: Secondary | ICD-10-CM | POA: Diagnosis not present

## 2023-02-20 DIAGNOSIS — I10 Essential (primary) hypertension: Secondary | ICD-10-CM | POA: Diagnosis not present

## 2023-02-20 DIAGNOSIS — R296 Repeated falls: Secondary | ICD-10-CM | POA: Diagnosis not present

## 2023-02-20 DIAGNOSIS — F5101 Primary insomnia: Secondary | ICD-10-CM | POA: Diagnosis not present

## 2023-02-20 DIAGNOSIS — H409 Unspecified glaucoma: Secondary | ICD-10-CM | POA: Diagnosis not present

## 2023-02-20 DIAGNOSIS — J302 Other seasonal allergic rhinitis: Secondary | ICD-10-CM | POA: Diagnosis not present

## 2023-02-20 DIAGNOSIS — F339 Major depressive disorder, recurrent, unspecified: Secondary | ICD-10-CM | POA: Diagnosis not present

## 2023-02-20 DIAGNOSIS — G20A1 Parkinson's disease without dyskinesia, without mention of fluctuations: Secondary | ICD-10-CM | POA: Diagnosis not present

## 2023-02-20 DIAGNOSIS — E785 Hyperlipidemia, unspecified: Secondary | ICD-10-CM | POA: Diagnosis not present

## 2023-02-20 DIAGNOSIS — G20C Parkinsonism, unspecified: Secondary | ICD-10-CM | POA: Diagnosis not present

## 2023-02-20 DIAGNOSIS — R001 Bradycardia, unspecified: Secondary | ICD-10-CM | POA: Diagnosis not present

## 2023-02-20 DIAGNOSIS — G311 Senile degeneration of brain, not elsewhere classified: Secondary | ICD-10-CM | POA: Diagnosis not present

## 2023-02-20 DIAGNOSIS — F0283 Dementia in other diseases classified elsewhere, unspecified severity, with mood disturbance: Secondary | ICD-10-CM | POA: Diagnosis not present

## 2023-02-21 ENCOUNTER — Telehealth (INDEPENDENT_AMBULATORY_CARE_PROVIDER_SITE_OTHER): Payer: Medicare Other | Admitting: Physician Assistant

## 2023-02-21 ENCOUNTER — Encounter: Payer: Self-pay | Admitting: Physician Assistant

## 2023-02-21 VITALS — Ht 63.0 in

## 2023-02-21 DIAGNOSIS — G301 Alzheimer's disease with late onset: Secondary | ICD-10-CM

## 2023-02-21 DIAGNOSIS — F02818 Dementia in other diseases classified elsewhere, unspecified severity, with other behavioral disturbance: Secondary | ICD-10-CM

## 2023-02-21 DIAGNOSIS — G20A1 Parkinson's disease without dyskinesia, without mention of fluctuations: Secondary | ICD-10-CM | POA: Diagnosis not present

## 2023-02-21 MED ORDER — CARBIDOPA-LEVODOPA 25-100 MG PO TABS: ORAL_TABLET | ORAL | 3 refills | Status: DC

## 2023-02-21 NOTE — Progress Notes (Signed)
Virtual Visit via Video Note The purpose of this virtual visit is to provide medical care in a patient that is unable to be seen in person due to physical or health limitations   Consent was obtained for video visit:  yes  Answered questions that patient had about telehealth interaction:  yes I discussed the limitations, risks, security and privacy concerns of performing an evaluation and management service by telemedicine. I also discussed with the patient that there may be a patient responsible charge related to this service. The patient expressed understanding and agreed to proceed.  Pt location: Home Physician Location: office Name of referring provider:  Edmonia Caprio, * I connected with [name] at patients initiation/request on 02/21/2023 at  1:00 PM EDT by video enabled telemedicine application and verified that I am speaking with the correct person using two identifiers. Pt MRN:  [id number] Pt DOB:  12-Jun-1936 Video Participants:  [name];  daughter    Assessment and Plan:    [name] is a 87 y.o. RH female  with a history of hypertension, anxiety, depression, dementia. Repeat Neuropsychological evaluation in 02/2021 indicated Major Neurocognitive disorder, mixed Alzheimer's disease and Parkinson's disease. Cognitive profile not consistent with LBD.     She is no longer on antidementia medication since September 2023 as the risk outweighed the benefits of the medicine, and is no longer therapeutic.  Parkinson tremors are controlled with Sinemet IR 25 100 3 times daily.  Patient is on [facility] with higher level of  memory care for safety due to risk for falls. She is now on [facility] as well. Given the current cognitive decline and overall status, daughter would prefer to follow up in 1 year as no aggressive cognitive treatment is indicated. Tremors are controlled.   Recommendations  Continue Sinemet IR 25/100 3 times daily, side effects  discussed Continue close supervision 24/7 for safety Continue to control mood as per NH Staff  Follow up in 1 year    History of Present Illness:    Any changes in memory since last visit?" In December of 2023 she has moved to higher level within the memory care, and she is now under Hospice, as well due to her frequent falls, including one fall last night,  so there is hands on help".-daughter says. "Mom is on wheelchair, but she still wants to get up. Insomnia causes her to have falls as well. Last night she was started on a low dose children Benadryl which seem to help. She is still verbal, but her memory may be worse. She is no longer on rivastigmine. She has moments of agitation in the late afternoon when sundown comes. She has some hallucination involving children and dogs, but these are not terrifying. The facility gives her Haldol with good response. She  showers 3 times a week with assistance. Needs assistance with meals, lost weight, now ar 110 lbs. She has intermittent constipation and diarrhea "gets backed up". No recent UTI, needs assistance most of the time.       History on Initial Assessment 12/18/2017: This is a pleasant 87 yo RH woman with a history of hypertension, anxiety, depression, who presented for evaluation of memory loss. She feels her memory is "a little shaky." She endorses a lot of anxiety, and states it is causing her not to recall things and she does not understand it. Family started noticing memory changes around 2 years ago. She has been the main caregiver for her  husband with dementia, who had been in and out of the hospital several times. She was "not dealing with things as well as I should." For instance, 2 years ago she was taking care of the managing their family home, but they noticed she was not keeping up with it, and turned it over to her sister. Her daughter took over finances in the Spring 2018 because she was afraid she would forget and she had never done the  taxes in the past. She lives alone and denies missing medications. She denies getting lost driving. She has 3 cats at home that are cared for, she does not forget to feed them. She has no difficulties running the household, doing laundry and yardwork. She has learned how to use the leafblower. Their main concern is there anxiety and depression have "really ramped up." She is so stressed and scattered, that she could not stay on task. Family started noticing anxiety around 4-5 years ago, but in January 2017 she was "in breakdown territory" and started Lexapro and counseling. It appears she was on a very low dose due to concern for side effects. She would wake up every morning between 3-4 AM with a panic attack, unable to reason with herself, shaky. Family reports that she would forget what time they told her she would be picked up, even if it was in a text message in front of her. They have noticed lack of focus and concentration. She would ask the same question about this doctor's appointment and was quite anxious about today's visit. Family reports she "tends to catastrophize things." No paranoia or hallucinations. She was switched from Lexapro to Prozac at the family's request, and "something about Prozac bothers me all in my head."    She denies any headaches, vertigo, diplopia, dysarthria/dysphagia, neck/back pain, focal numbness/tingling/weakness, bowel/bladder dysfunction. She reports a "mental dizziness," where she states "my brain just feels full." She has tremors, R>L. She had lost her sense of smell after sinus surgery many years ago. Her mother had dementia in her 31s, her older sister has memory issues. No history of significant head injuries, no alcohol use.    Diagnostic Data: Neuropsychological evaluation on 02/09/2021, Dr. Melvyn Novas: "Briefly, results suggested continued and severe impairment surrounding retrieval and consolidation aspects of memory. Processing speed was also impaired, while  performance variability was exhibited across executive functioning and semantic fluency. Regarding etiology, I continue to have strong concerns surrounding Alzheimer's disease. Despite being able to learning information reasonably well, she was amnestic after a brief delay with evidence for a memory storage deficit and rapid forgetting. Given the results of this scan, the potential for a "mixed dementia" presentation with facets of Alzheimer's disease and Parkinson's disease remains possible."   Repeat Neuropsychological evaluation 02/2021 by Dr. Melvyn Novas indicated Major Neurocognitive Disorder with majority of scores remaining stable. Regarding etiology, still have strong concerns for AD, with mixed presentation of AD and PD in light of DaTscan. Cognitive profile not consistent with typical LBD.   MRI brain with and without contrast 07/2020: no acute changes, mild cerebral atrophy and chronic microvascular disease   DaTscan 07/2020 showed asymmetric decreased radiotracer activity within the RIGHT stratum compared to the LEFT. There is near absent activity in the posterior RIGHT striatum (putamen) and reduced activity in the head of the RIGHT caudate nucleus. Reduced radiotracer activity in the posterior LEFT striatum       Current Outpatient Medications on File Prior to Visit  Medication Sig Dispense Refill   buPROPion  ER (WELLBUTRIN SR) 100 MG 12 hr tablet TAKE ONE TABLET BY MOUTH EVERY MORNING FOR MOOD 30 tablet 0   Calcium Carbonate-Vitamin D (CALTRATE 600+D PO) Take 1 each by mouth daily. gummie     carbidopa-levodopa (SINEMET IR) 25-100 MG tablet TAKE 1 TABLET BY MOUTH THREE TIMES A DAY WITH MEALS 90 tablet 2   dorzolamide-timolol (COSOPT) 22.3-6.8 MG/ML ophthalmic solution Place 1 drop into both eyes 2 (two) times daily.     fluticasone (FLONASE) 50 MCG/ACT nasal spray Place 1 spray into both nostrils in the morning, at noon, and at bedtime.     haloperidol (HALDOL) 2 MG/ML solution Take by mouth.      hydrocortisone cream 1 % Apply 1 Application topically 2 (two) times daily.     Melatonin 1 MG CHEW Chew 5 mg by mouth at bedtime.     Misc Natural Products (CRANBERRY/PROBIOTIC PO) Take by mouth.     Multiple Vitamins-Minerals (MULTIVITAMIN GUMMIES ADULT PO) Take by mouth daily.     psyllium (METAMUCIL SMOOTH TEXTURE) 58.6 % powder Take 1 packet by mouth daily. (Patient taking differently: Take 1 packet by mouth as needed (constipation).) 283 g 12   rivastigmine (EXELON) 4.6 mg/24hr Place 1 patch (4.6 mg total) onto the skin daily. (Patient not taking: Reported on 08/23/2022) 30 patch 11   sertraline (ZOLOFT) 50 MG tablet TAKE ONE TABLET BY MOUTH DAILY FOR ANXIETY 30 tablet 0   No current facility-administered medications on file prior to visit.     Observations/Objective:   There were no vitals filed for this visit.  Unable to perform, as the video visit was truncated due to technical problems and had to be transformed into phone visit      Follow Up Instructions:    -I discussed the assessment and treatment plan with the patient. The patient was provided an opportunity to ask questions and all were answered. The patient agreed with the plan and demonstrated an understanding of the instructions.   The patient was advised to call back or seek an in-person evaluation if the symptoms worsen or if the condition fails to improve as anticipated.    Total time spent on today's visit was 14mnutes, including both face-to-face time and nonface-to-face time.  Time included that spent on review of records (prior notes available to me/labs/imaging if pertinent), discussing treatment and goals, answering patient's questions and coordinating care.   SSharene Butters PA-C

## 2023-02-22 DIAGNOSIS — G311 Senile degeneration of brain, not elsewhere classified: Secondary | ICD-10-CM | POA: Diagnosis not present

## 2023-02-22 DIAGNOSIS — G20A1 Parkinson's disease without dyskinesia, without mention of fluctuations: Secondary | ICD-10-CM | POA: Diagnosis not present

## 2023-02-22 DIAGNOSIS — R001 Bradycardia, unspecified: Secondary | ICD-10-CM | POA: Diagnosis not present

## 2023-02-22 DIAGNOSIS — I10 Essential (primary) hypertension: Secondary | ICD-10-CM | POA: Diagnosis not present

## 2023-02-22 DIAGNOSIS — R296 Repeated falls: Secondary | ICD-10-CM | POA: Diagnosis not present

## 2023-02-22 DIAGNOSIS — F0283 Dementia in other diseases classified elsewhere, unspecified severity, with mood disturbance: Secondary | ICD-10-CM | POA: Diagnosis not present

## 2023-02-25 DIAGNOSIS — G311 Senile degeneration of brain, not elsewhere classified: Secondary | ICD-10-CM | POA: Diagnosis not present

## 2023-02-25 DIAGNOSIS — I10 Essential (primary) hypertension: Secondary | ICD-10-CM | POA: Diagnosis not present

## 2023-02-25 DIAGNOSIS — F0283 Dementia in other diseases classified elsewhere, unspecified severity, with mood disturbance: Secondary | ICD-10-CM | POA: Diagnosis not present

## 2023-02-25 DIAGNOSIS — G20A1 Parkinson's disease without dyskinesia, without mention of fluctuations: Secondary | ICD-10-CM | POA: Diagnosis not present

## 2023-02-25 DIAGNOSIS — R001 Bradycardia, unspecified: Secondary | ICD-10-CM | POA: Diagnosis not present

## 2023-02-25 DIAGNOSIS — R296 Repeated falls: Secondary | ICD-10-CM | POA: Diagnosis not present

## 2023-02-27 DIAGNOSIS — R001 Bradycardia, unspecified: Secondary | ICD-10-CM | POA: Diagnosis not present

## 2023-02-27 DIAGNOSIS — I10 Essential (primary) hypertension: Secondary | ICD-10-CM | POA: Diagnosis not present

## 2023-02-27 DIAGNOSIS — G20A1 Parkinson's disease without dyskinesia, without mention of fluctuations: Secondary | ICD-10-CM | POA: Diagnosis not present

## 2023-02-27 DIAGNOSIS — G311 Senile degeneration of brain, not elsewhere classified: Secondary | ICD-10-CM | POA: Diagnosis not present

## 2023-02-27 DIAGNOSIS — R296 Repeated falls: Secondary | ICD-10-CM | POA: Diagnosis not present

## 2023-02-27 DIAGNOSIS — F0283 Dementia in other diseases classified elsewhere, unspecified severity, with mood disturbance: Secondary | ICD-10-CM | POA: Diagnosis not present

## 2023-03-01 DIAGNOSIS — F331 Major depressive disorder, recurrent, moderate: Secondary | ICD-10-CM | POA: Diagnosis not present

## 2023-03-01 DIAGNOSIS — G3183 Dementia with Lewy bodies: Secondary | ICD-10-CM | POA: Diagnosis not present

## 2023-03-01 DIAGNOSIS — R001 Bradycardia, unspecified: Secondary | ICD-10-CM | POA: Diagnosis not present

## 2023-03-01 DIAGNOSIS — Z9181 History of falling: Secondary | ICD-10-CM | POA: Diagnosis not present

## 2023-03-01 DIAGNOSIS — G311 Senile degeneration of brain, not elsewhere classified: Secondary | ICD-10-CM | POA: Diagnosis not present

## 2023-03-01 DIAGNOSIS — Z87440 Personal history of urinary (tract) infections: Secondary | ICD-10-CM | POA: Diagnosis not present

## 2023-03-01 DIAGNOSIS — F0280 Dementia in other diseases classified elsewhere without behavioral disturbance: Secondary | ICD-10-CM | POA: Diagnosis not present

## 2023-03-01 DIAGNOSIS — I10 Essential (primary) hypertension: Secondary | ICD-10-CM | POA: Diagnosis not present

## 2023-03-01 DIAGNOSIS — Z993 Dependence on wheelchair: Secondary | ICD-10-CM | POA: Diagnosis not present

## 2023-03-01 DIAGNOSIS — F0283 Dementia in other diseases classified elsewhere, unspecified severity, with mood disturbance: Secondary | ICD-10-CM | POA: Diagnosis not present

## 2023-03-01 DIAGNOSIS — R296 Repeated falls: Secondary | ICD-10-CM | POA: Diagnosis not present

## 2023-03-01 DIAGNOSIS — G20B1 Parkinson's disease with dyskinesia, without mention of fluctuations: Secondary | ICD-10-CM | POA: Diagnosis not present

## 2023-03-01 DIAGNOSIS — G20A1 Parkinson's disease without dyskinesia, without mention of fluctuations: Secondary | ICD-10-CM | POA: Diagnosis not present

## 2023-03-03 DIAGNOSIS — I10 Essential (primary) hypertension: Secondary | ICD-10-CM | POA: Diagnosis not present

## 2023-03-03 DIAGNOSIS — R296 Repeated falls: Secondary | ICD-10-CM | POA: Diagnosis not present

## 2023-03-03 DIAGNOSIS — F0283 Dementia in other diseases classified elsewhere, unspecified severity, with mood disturbance: Secondary | ICD-10-CM | POA: Diagnosis not present

## 2023-03-03 DIAGNOSIS — G311 Senile degeneration of brain, not elsewhere classified: Secondary | ICD-10-CM | POA: Diagnosis not present

## 2023-03-03 DIAGNOSIS — R001 Bradycardia, unspecified: Secondary | ICD-10-CM | POA: Diagnosis not present

## 2023-03-03 DIAGNOSIS — G20A1 Parkinson's disease without dyskinesia, without mention of fluctuations: Secondary | ICD-10-CM | POA: Diagnosis not present

## 2023-03-04 DIAGNOSIS — I10 Essential (primary) hypertension: Secondary | ICD-10-CM | POA: Diagnosis not present

## 2023-03-04 DIAGNOSIS — R001 Bradycardia, unspecified: Secondary | ICD-10-CM | POA: Diagnosis not present

## 2023-03-04 DIAGNOSIS — F0283 Dementia in other diseases classified elsewhere, unspecified severity, with mood disturbance: Secondary | ICD-10-CM | POA: Diagnosis not present

## 2023-03-04 DIAGNOSIS — R296 Repeated falls: Secondary | ICD-10-CM | POA: Diagnosis not present

## 2023-03-04 DIAGNOSIS — G20A1 Parkinson's disease without dyskinesia, without mention of fluctuations: Secondary | ICD-10-CM | POA: Diagnosis not present

## 2023-03-04 DIAGNOSIS — G311 Senile degeneration of brain, not elsewhere classified: Secondary | ICD-10-CM | POA: Diagnosis not present

## 2023-03-06 DIAGNOSIS — F0283 Dementia in other diseases classified elsewhere, unspecified severity, with mood disturbance: Secondary | ICD-10-CM | POA: Diagnosis not present

## 2023-03-06 DIAGNOSIS — R001 Bradycardia, unspecified: Secondary | ICD-10-CM | POA: Diagnosis not present

## 2023-03-06 DIAGNOSIS — G20A1 Parkinson's disease without dyskinesia, without mention of fluctuations: Secondary | ICD-10-CM | POA: Diagnosis not present

## 2023-03-06 DIAGNOSIS — I10 Essential (primary) hypertension: Secondary | ICD-10-CM | POA: Diagnosis not present

## 2023-03-06 DIAGNOSIS — R296 Repeated falls: Secondary | ICD-10-CM | POA: Diagnosis not present

## 2023-03-06 DIAGNOSIS — G311 Senile degeneration of brain, not elsewhere classified: Secondary | ICD-10-CM | POA: Diagnosis not present

## 2023-03-07 DIAGNOSIS — G311 Senile degeneration of brain, not elsewhere classified: Secondary | ICD-10-CM | POA: Diagnosis not present

## 2023-03-07 DIAGNOSIS — F0283 Dementia in other diseases classified elsewhere, unspecified severity, with mood disturbance: Secondary | ICD-10-CM | POA: Diagnosis not present

## 2023-03-07 DIAGNOSIS — R001 Bradycardia, unspecified: Secondary | ICD-10-CM | POA: Diagnosis not present

## 2023-03-07 DIAGNOSIS — I10 Essential (primary) hypertension: Secondary | ICD-10-CM | POA: Diagnosis not present

## 2023-03-07 DIAGNOSIS — G20A1 Parkinson's disease without dyskinesia, without mention of fluctuations: Secondary | ICD-10-CM | POA: Diagnosis not present

## 2023-03-07 DIAGNOSIS — R296 Repeated falls: Secondary | ICD-10-CM | POA: Diagnosis not present

## 2023-03-09 DIAGNOSIS — F0283 Dementia in other diseases classified elsewhere, unspecified severity, with mood disturbance: Secondary | ICD-10-CM | POA: Diagnosis not present

## 2023-03-09 DIAGNOSIS — G20A1 Parkinson's disease without dyskinesia, without mention of fluctuations: Secondary | ICD-10-CM | POA: Diagnosis not present

## 2023-03-09 DIAGNOSIS — F5101 Primary insomnia: Secondary | ICD-10-CM | POA: Diagnosis not present

## 2023-03-09 DIAGNOSIS — I10 Essential (primary) hypertension: Secondary | ICD-10-CM | POA: Diagnosis not present

## 2023-03-09 DIAGNOSIS — R001 Bradycardia, unspecified: Secondary | ICD-10-CM | POA: Diagnosis not present

## 2023-03-09 DIAGNOSIS — G3183 Dementia with Lewy bodies: Secondary | ICD-10-CM | POA: Diagnosis not present

## 2023-03-09 DIAGNOSIS — F411 Generalized anxiety disorder: Secondary | ICD-10-CM | POA: Diagnosis not present

## 2023-03-09 DIAGNOSIS — F331 Major depressive disorder, recurrent, moderate: Secondary | ICD-10-CM | POA: Diagnosis not present

## 2023-03-09 DIAGNOSIS — G311 Senile degeneration of brain, not elsewhere classified: Secondary | ICD-10-CM | POA: Diagnosis not present

## 2023-03-09 DIAGNOSIS — R296 Repeated falls: Secondary | ICD-10-CM | POA: Diagnosis not present

## 2023-03-09 DIAGNOSIS — G20B1 Parkinson's disease with dyskinesia, without mention of fluctuations: Secondary | ICD-10-CM | POA: Diagnosis not present

## 2023-03-09 DIAGNOSIS — F02B2 Dementia in other diseases classified elsewhere, moderate, with psychotic disturbance: Secondary | ICD-10-CM | POA: Diagnosis not present

## 2023-03-10 DIAGNOSIS — G3183 Dementia with Lewy bodies: Secondary | ICD-10-CM | POA: Diagnosis not present

## 2023-03-10 DIAGNOSIS — R296 Repeated falls: Secondary | ICD-10-CM | POA: Diagnosis not present

## 2023-03-10 DIAGNOSIS — H409 Unspecified glaucoma: Secondary | ICD-10-CM | POA: Diagnosis not present

## 2023-03-10 DIAGNOSIS — G20A1 Parkinson's disease without dyskinesia, without mention of fluctuations: Secondary | ICD-10-CM | POA: Diagnosis not present

## 2023-03-10 DIAGNOSIS — I10 Essential (primary) hypertension: Secondary | ICD-10-CM | POA: Diagnosis not present

## 2023-03-10 DIAGNOSIS — R001 Bradycardia, unspecified: Secondary | ICD-10-CM | POA: Diagnosis not present

## 2023-03-10 DIAGNOSIS — F0283 Dementia in other diseases classified elsewhere, unspecified severity, with mood disturbance: Secondary | ICD-10-CM | POA: Diagnosis not present

## 2023-03-10 DIAGNOSIS — G311 Senile degeneration of brain, not elsewhere classified: Secondary | ICD-10-CM | POA: Diagnosis not present

## 2023-03-13 DIAGNOSIS — R001 Bradycardia, unspecified: Secondary | ICD-10-CM | POA: Diagnosis not present

## 2023-03-13 DIAGNOSIS — H409 Unspecified glaucoma: Secondary | ICD-10-CM | POA: Diagnosis not present

## 2023-03-13 DIAGNOSIS — F0283 Dementia in other diseases classified elsewhere, unspecified severity, with mood disturbance: Secondary | ICD-10-CM | POA: Diagnosis not present

## 2023-03-13 DIAGNOSIS — R296 Repeated falls: Secondary | ICD-10-CM | POA: Diagnosis not present

## 2023-03-13 DIAGNOSIS — L249 Irritant contact dermatitis, unspecified cause: Secondary | ICD-10-CM | POA: Diagnosis not present

## 2023-03-13 DIAGNOSIS — G20A1 Parkinson's disease without dyskinesia, without mention of fluctuations: Secondary | ICD-10-CM | POA: Diagnosis not present

## 2023-03-13 DIAGNOSIS — E785 Hyperlipidemia, unspecified: Secondary | ICD-10-CM | POA: Diagnosis not present

## 2023-03-13 DIAGNOSIS — I10 Essential (primary) hypertension: Secondary | ICD-10-CM | POA: Diagnosis not present

## 2023-03-13 DIAGNOSIS — J302 Other seasonal allergic rhinitis: Secondary | ICD-10-CM | POA: Diagnosis not present

## 2023-03-13 DIAGNOSIS — G311 Senile degeneration of brain, not elsewhere classified: Secondary | ICD-10-CM | POA: Diagnosis not present

## 2023-03-15 DIAGNOSIS — G311 Senile degeneration of brain, not elsewhere classified: Secondary | ICD-10-CM | POA: Diagnosis not present

## 2023-03-15 DIAGNOSIS — I10 Essential (primary) hypertension: Secondary | ICD-10-CM | POA: Diagnosis not present

## 2023-03-15 DIAGNOSIS — R296 Repeated falls: Secondary | ICD-10-CM | POA: Diagnosis not present

## 2023-03-15 DIAGNOSIS — F0283 Dementia in other diseases classified elsewhere, unspecified severity, with mood disturbance: Secondary | ICD-10-CM | POA: Diagnosis not present

## 2023-03-15 DIAGNOSIS — G20A1 Parkinson's disease without dyskinesia, without mention of fluctuations: Secondary | ICD-10-CM | POA: Diagnosis not present

## 2023-03-15 DIAGNOSIS — R001 Bradycardia, unspecified: Secondary | ICD-10-CM | POA: Diagnosis not present

## 2023-03-17 DIAGNOSIS — G311 Senile degeneration of brain, not elsewhere classified: Secondary | ICD-10-CM | POA: Diagnosis not present

## 2023-03-17 DIAGNOSIS — R296 Repeated falls: Secondary | ICD-10-CM | POA: Diagnosis not present

## 2023-03-17 DIAGNOSIS — G20A1 Parkinson's disease without dyskinesia, without mention of fluctuations: Secondary | ICD-10-CM | POA: Diagnosis not present

## 2023-03-17 DIAGNOSIS — R001 Bradycardia, unspecified: Secondary | ICD-10-CM | POA: Diagnosis not present

## 2023-03-17 DIAGNOSIS — F0283 Dementia in other diseases classified elsewhere, unspecified severity, with mood disturbance: Secondary | ICD-10-CM | POA: Diagnosis not present

## 2023-03-17 DIAGNOSIS — I10 Essential (primary) hypertension: Secondary | ICD-10-CM | POA: Diagnosis not present

## 2023-03-20 DIAGNOSIS — G20A1 Parkinson's disease without dyskinesia, without mention of fluctuations: Secondary | ICD-10-CM | POA: Diagnosis not present

## 2023-03-20 DIAGNOSIS — F331 Major depressive disorder, recurrent, moderate: Secondary | ICD-10-CM | POA: Diagnosis not present

## 2023-03-20 DIAGNOSIS — R001 Bradycardia, unspecified: Secondary | ICD-10-CM | POA: Diagnosis not present

## 2023-03-20 DIAGNOSIS — G20B1 Parkinson's disease with dyskinesia, without mention of fluctuations: Secondary | ICD-10-CM | POA: Diagnosis not present

## 2023-03-20 DIAGNOSIS — I10 Essential (primary) hypertension: Secondary | ICD-10-CM | POA: Diagnosis not present

## 2023-03-20 DIAGNOSIS — F5101 Primary insomnia: Secondary | ICD-10-CM | POA: Diagnosis not present

## 2023-03-20 DIAGNOSIS — G311 Senile degeneration of brain, not elsewhere classified: Secondary | ICD-10-CM | POA: Diagnosis not present

## 2023-03-20 DIAGNOSIS — F0283 Dementia in other diseases classified elsewhere, unspecified severity, with mood disturbance: Secondary | ICD-10-CM | POA: Diagnosis not present

## 2023-03-20 DIAGNOSIS — R296 Repeated falls: Secondary | ICD-10-CM | POA: Diagnosis not present

## 2023-03-21 DIAGNOSIS — G311 Senile degeneration of brain, not elsewhere classified: Secondary | ICD-10-CM | POA: Diagnosis not present

## 2023-03-21 DIAGNOSIS — I10 Essential (primary) hypertension: Secondary | ICD-10-CM | POA: Diagnosis not present

## 2023-03-21 DIAGNOSIS — R001 Bradycardia, unspecified: Secondary | ICD-10-CM | POA: Diagnosis not present

## 2023-03-21 DIAGNOSIS — G20A1 Parkinson's disease without dyskinesia, without mention of fluctuations: Secondary | ICD-10-CM | POA: Diagnosis not present

## 2023-03-21 DIAGNOSIS — F0283 Dementia in other diseases classified elsewhere, unspecified severity, with mood disturbance: Secondary | ICD-10-CM | POA: Diagnosis not present

## 2023-03-21 DIAGNOSIS — R296 Repeated falls: Secondary | ICD-10-CM | POA: Diagnosis not present

## 2023-03-22 DIAGNOSIS — G311 Senile degeneration of brain, not elsewhere classified: Secondary | ICD-10-CM | POA: Diagnosis not present

## 2023-03-22 DIAGNOSIS — R296 Repeated falls: Secondary | ICD-10-CM | POA: Diagnosis not present

## 2023-03-22 DIAGNOSIS — G20A1 Parkinson's disease without dyskinesia, without mention of fluctuations: Secondary | ICD-10-CM | POA: Diagnosis not present

## 2023-03-22 DIAGNOSIS — R001 Bradycardia, unspecified: Secondary | ICD-10-CM | POA: Diagnosis not present

## 2023-03-22 DIAGNOSIS — F0283 Dementia in other diseases classified elsewhere, unspecified severity, with mood disturbance: Secondary | ICD-10-CM | POA: Diagnosis not present

## 2023-03-22 DIAGNOSIS — I10 Essential (primary) hypertension: Secondary | ICD-10-CM | POA: Diagnosis not present

## 2023-03-23 DIAGNOSIS — F0283 Dementia in other diseases classified elsewhere, unspecified severity, with mood disturbance: Secondary | ICD-10-CM | POA: Diagnosis not present

## 2023-03-23 DIAGNOSIS — R296 Repeated falls: Secondary | ICD-10-CM | POA: Diagnosis not present

## 2023-03-23 DIAGNOSIS — G20A1 Parkinson's disease without dyskinesia, without mention of fluctuations: Secondary | ICD-10-CM | POA: Diagnosis not present

## 2023-03-23 DIAGNOSIS — I10 Essential (primary) hypertension: Secondary | ICD-10-CM | POA: Diagnosis not present

## 2023-03-23 DIAGNOSIS — G311 Senile degeneration of brain, not elsewhere classified: Secondary | ICD-10-CM | POA: Diagnosis not present

## 2023-03-23 DIAGNOSIS — R001 Bradycardia, unspecified: Secondary | ICD-10-CM | POA: Diagnosis not present

## 2023-03-24 DIAGNOSIS — G311 Senile degeneration of brain, not elsewhere classified: Secondary | ICD-10-CM | POA: Diagnosis not present

## 2023-03-24 DIAGNOSIS — G20A1 Parkinson's disease without dyskinesia, without mention of fluctuations: Secondary | ICD-10-CM | POA: Diagnosis not present

## 2023-03-24 DIAGNOSIS — R001 Bradycardia, unspecified: Secondary | ICD-10-CM | POA: Diagnosis not present

## 2023-03-24 DIAGNOSIS — R296 Repeated falls: Secondary | ICD-10-CM | POA: Diagnosis not present

## 2023-03-24 DIAGNOSIS — F0283 Dementia in other diseases classified elsewhere, unspecified severity, with mood disturbance: Secondary | ICD-10-CM | POA: Diagnosis not present

## 2023-03-24 DIAGNOSIS — I10 Essential (primary) hypertension: Secondary | ICD-10-CM | POA: Diagnosis not present

## 2023-03-27 DIAGNOSIS — R296 Repeated falls: Secondary | ICD-10-CM | POA: Diagnosis not present

## 2023-03-27 DIAGNOSIS — G20A1 Parkinson's disease without dyskinesia, without mention of fluctuations: Secondary | ICD-10-CM | POA: Diagnosis not present

## 2023-03-27 DIAGNOSIS — F0283 Dementia in other diseases classified elsewhere, unspecified severity, with mood disturbance: Secondary | ICD-10-CM | POA: Diagnosis not present

## 2023-03-27 DIAGNOSIS — I10 Essential (primary) hypertension: Secondary | ICD-10-CM | POA: Diagnosis not present

## 2023-03-27 DIAGNOSIS — R001 Bradycardia, unspecified: Secondary | ICD-10-CM | POA: Diagnosis not present

## 2023-03-27 DIAGNOSIS — G311 Senile degeneration of brain, not elsewhere classified: Secondary | ICD-10-CM | POA: Diagnosis not present

## 2023-03-28 DIAGNOSIS — G311 Senile degeneration of brain, not elsewhere classified: Secondary | ICD-10-CM | POA: Diagnosis not present

## 2023-03-28 DIAGNOSIS — G20A1 Parkinson's disease without dyskinesia, without mention of fluctuations: Secondary | ICD-10-CM | POA: Diagnosis not present

## 2023-03-28 DIAGNOSIS — R296 Repeated falls: Secondary | ICD-10-CM | POA: Diagnosis not present

## 2023-03-28 DIAGNOSIS — R001 Bradycardia, unspecified: Secondary | ICD-10-CM | POA: Diagnosis not present

## 2023-03-28 DIAGNOSIS — F0283 Dementia in other diseases classified elsewhere, unspecified severity, with mood disturbance: Secondary | ICD-10-CM | POA: Diagnosis not present

## 2023-03-28 DIAGNOSIS — I10 Essential (primary) hypertension: Secondary | ICD-10-CM | POA: Diagnosis not present

## 2023-03-29 DIAGNOSIS — R001 Bradycardia, unspecified: Secondary | ICD-10-CM | POA: Diagnosis not present

## 2023-03-29 DIAGNOSIS — I10 Essential (primary) hypertension: Secondary | ICD-10-CM | POA: Diagnosis not present

## 2023-03-29 DIAGNOSIS — R296 Repeated falls: Secondary | ICD-10-CM | POA: Diagnosis not present

## 2023-03-29 DIAGNOSIS — G20A1 Parkinson's disease without dyskinesia, without mention of fluctuations: Secondary | ICD-10-CM | POA: Diagnosis not present

## 2023-03-29 DIAGNOSIS — F0283 Dementia in other diseases classified elsewhere, unspecified severity, with mood disturbance: Secondary | ICD-10-CM | POA: Diagnosis not present

## 2023-03-29 DIAGNOSIS — G311 Senile degeneration of brain, not elsewhere classified: Secondary | ICD-10-CM | POA: Diagnosis not present

## 2023-03-31 DIAGNOSIS — F062 Psychotic disorder with delusions due to known physiological condition: Secondary | ICD-10-CM | POA: Diagnosis not present

## 2023-03-31 DIAGNOSIS — F5101 Primary insomnia: Secondary | ICD-10-CM | POA: Diagnosis not present

## 2023-03-31 DIAGNOSIS — G311 Senile degeneration of brain, not elsewhere classified: Secondary | ICD-10-CM | POA: Diagnosis not present

## 2023-03-31 DIAGNOSIS — R296 Repeated falls: Secondary | ICD-10-CM | POA: Diagnosis not present

## 2023-03-31 DIAGNOSIS — F331 Major depressive disorder, recurrent, moderate: Secondary | ICD-10-CM | POA: Diagnosis not present

## 2023-03-31 DIAGNOSIS — F02B2 Dementia in other diseases classified elsewhere, moderate, with psychotic disturbance: Secondary | ICD-10-CM | POA: Diagnosis not present

## 2023-03-31 DIAGNOSIS — R001 Bradycardia, unspecified: Secondary | ICD-10-CM | POA: Diagnosis not present

## 2023-03-31 DIAGNOSIS — F0283 Dementia in other diseases classified elsewhere, unspecified severity, with mood disturbance: Secondary | ICD-10-CM | POA: Diagnosis not present

## 2023-03-31 DIAGNOSIS — G20B1 Parkinson's disease with dyskinesia, without mention of fluctuations: Secondary | ICD-10-CM | POA: Diagnosis not present

## 2023-03-31 DIAGNOSIS — G20A1 Parkinson's disease without dyskinesia, without mention of fluctuations: Secondary | ICD-10-CM | POA: Diagnosis not present

## 2023-03-31 DIAGNOSIS — R443 Hallucinations, unspecified: Secondary | ICD-10-CM | POA: Diagnosis not present

## 2023-03-31 DIAGNOSIS — I10 Essential (primary) hypertension: Secondary | ICD-10-CM | POA: Diagnosis not present

## 2023-04-02 DIAGNOSIS — R296 Repeated falls: Secondary | ICD-10-CM | POA: Diagnosis not present

## 2023-04-02 DIAGNOSIS — R001 Bradycardia, unspecified: Secondary | ICD-10-CM | POA: Diagnosis not present

## 2023-04-02 DIAGNOSIS — G20A1 Parkinson's disease without dyskinesia, without mention of fluctuations: Secondary | ICD-10-CM | POA: Diagnosis not present

## 2023-04-02 DIAGNOSIS — F0283 Dementia in other diseases classified elsewhere, unspecified severity, with mood disturbance: Secondary | ICD-10-CM | POA: Diagnosis not present

## 2023-04-02 DIAGNOSIS — I10 Essential (primary) hypertension: Secondary | ICD-10-CM | POA: Diagnosis not present

## 2023-04-02 DIAGNOSIS — G311 Senile degeneration of brain, not elsewhere classified: Secondary | ICD-10-CM | POA: Diagnosis not present

## 2023-04-03 DIAGNOSIS — F331 Major depressive disorder, recurrent, moderate: Secondary | ICD-10-CM | POA: Diagnosis not present

## 2023-04-03 DIAGNOSIS — R001 Bradycardia, unspecified: Secondary | ICD-10-CM | POA: Diagnosis not present

## 2023-04-03 DIAGNOSIS — F0283 Dementia in other diseases classified elsewhere, unspecified severity, with mood disturbance: Secondary | ICD-10-CM | POA: Diagnosis not present

## 2023-04-03 DIAGNOSIS — G20A1 Parkinson's disease without dyskinesia, without mention of fluctuations: Secondary | ICD-10-CM | POA: Diagnosis not present

## 2023-04-03 DIAGNOSIS — W010XXA Fall on same level from slipping, tripping and stumbling without subsequent striking against object, initial encounter: Secondary | ICD-10-CM | POA: Diagnosis not present

## 2023-04-03 DIAGNOSIS — I10 Essential (primary) hypertension: Secondary | ICD-10-CM | POA: Diagnosis not present

## 2023-04-03 DIAGNOSIS — G311 Senile degeneration of brain, not elsewhere classified: Secondary | ICD-10-CM | POA: Diagnosis not present

## 2023-04-03 DIAGNOSIS — R296 Repeated falls: Secondary | ICD-10-CM | POA: Diagnosis not present

## 2023-04-04 DIAGNOSIS — F0283 Dementia in other diseases classified elsewhere, unspecified severity, with mood disturbance: Secondary | ICD-10-CM | POA: Diagnosis not present

## 2023-04-04 DIAGNOSIS — R296 Repeated falls: Secondary | ICD-10-CM | POA: Diagnosis not present

## 2023-04-04 DIAGNOSIS — G311 Senile degeneration of brain, not elsewhere classified: Secondary | ICD-10-CM | POA: Diagnosis not present

## 2023-04-04 DIAGNOSIS — R001 Bradycardia, unspecified: Secondary | ICD-10-CM | POA: Diagnosis not present

## 2023-04-04 DIAGNOSIS — G20A1 Parkinson's disease without dyskinesia, without mention of fluctuations: Secondary | ICD-10-CM | POA: Diagnosis not present

## 2023-04-04 DIAGNOSIS — I10 Essential (primary) hypertension: Secondary | ICD-10-CM | POA: Diagnosis not present

## 2023-04-06 DIAGNOSIS — F0283 Dementia in other diseases classified elsewhere, unspecified severity, with mood disturbance: Secondary | ICD-10-CM | POA: Diagnosis not present

## 2023-04-06 DIAGNOSIS — G20A1 Parkinson's disease without dyskinesia, without mention of fluctuations: Secondary | ICD-10-CM | POA: Diagnosis not present

## 2023-04-06 DIAGNOSIS — R001 Bradycardia, unspecified: Secondary | ICD-10-CM | POA: Diagnosis not present

## 2023-04-06 DIAGNOSIS — G311 Senile degeneration of brain, not elsewhere classified: Secondary | ICD-10-CM | POA: Diagnosis not present

## 2023-04-06 DIAGNOSIS — R296 Repeated falls: Secondary | ICD-10-CM | POA: Diagnosis not present

## 2023-04-06 DIAGNOSIS — I10 Essential (primary) hypertension: Secondary | ICD-10-CM | POA: Diagnosis not present

## 2023-04-07 DIAGNOSIS — I10 Essential (primary) hypertension: Secondary | ICD-10-CM | POA: Diagnosis not present

## 2023-04-07 DIAGNOSIS — G20A1 Parkinson's disease without dyskinesia, without mention of fluctuations: Secondary | ICD-10-CM | POA: Diagnosis not present

## 2023-04-07 DIAGNOSIS — R001 Bradycardia, unspecified: Secondary | ICD-10-CM | POA: Diagnosis not present

## 2023-04-07 DIAGNOSIS — F0283 Dementia in other diseases classified elsewhere, unspecified severity, with mood disturbance: Secondary | ICD-10-CM | POA: Diagnosis not present

## 2023-04-07 DIAGNOSIS — G311 Senile degeneration of brain, not elsewhere classified: Secondary | ICD-10-CM | POA: Diagnosis not present

## 2023-04-07 DIAGNOSIS — R296 Repeated falls: Secondary | ICD-10-CM | POA: Diagnosis not present

## 2023-04-10 DIAGNOSIS — G20A1 Parkinson's disease without dyskinesia, without mention of fluctuations: Secondary | ICD-10-CM | POA: Diagnosis not present

## 2023-04-10 DIAGNOSIS — I10 Essential (primary) hypertension: Secondary | ICD-10-CM | POA: Diagnosis not present

## 2023-04-10 DIAGNOSIS — G311 Senile degeneration of brain, not elsewhere classified: Secondary | ICD-10-CM | POA: Diagnosis not present

## 2023-04-10 DIAGNOSIS — R001 Bradycardia, unspecified: Secondary | ICD-10-CM | POA: Diagnosis not present

## 2023-04-10 DIAGNOSIS — R296 Repeated falls: Secondary | ICD-10-CM | POA: Diagnosis not present

## 2023-04-10 DIAGNOSIS — F0283 Dementia in other diseases classified elsewhere, unspecified severity, with mood disturbance: Secondary | ICD-10-CM | POA: Diagnosis not present

## 2023-04-12 DIAGNOSIS — L249 Irritant contact dermatitis, unspecified cause: Secondary | ICD-10-CM | POA: Diagnosis not present

## 2023-04-12 DIAGNOSIS — J302 Other seasonal allergic rhinitis: Secondary | ICD-10-CM | POA: Diagnosis not present

## 2023-04-12 DIAGNOSIS — R001 Bradycardia, unspecified: Secondary | ICD-10-CM | POA: Diagnosis not present

## 2023-04-12 DIAGNOSIS — R296 Repeated falls: Secondary | ICD-10-CM | POA: Diagnosis not present

## 2023-04-12 DIAGNOSIS — F0283 Dementia in other diseases classified elsewhere, unspecified severity, with mood disturbance: Secondary | ICD-10-CM | POA: Diagnosis not present

## 2023-04-12 DIAGNOSIS — G311 Senile degeneration of brain, not elsewhere classified: Secondary | ICD-10-CM | POA: Diagnosis not present

## 2023-04-12 DIAGNOSIS — E785 Hyperlipidemia, unspecified: Secondary | ICD-10-CM | POA: Diagnosis not present

## 2023-04-12 DIAGNOSIS — H409 Unspecified glaucoma: Secondary | ICD-10-CM | POA: Diagnosis not present

## 2023-04-12 DIAGNOSIS — G20A1 Parkinson's disease without dyskinesia, without mention of fluctuations: Secondary | ICD-10-CM | POA: Diagnosis not present

## 2023-04-12 DIAGNOSIS — I10 Essential (primary) hypertension: Secondary | ICD-10-CM | POA: Diagnosis not present

## 2023-04-13 DIAGNOSIS — I10 Essential (primary) hypertension: Secondary | ICD-10-CM | POA: Diagnosis not present

## 2023-04-13 DIAGNOSIS — G311 Senile degeneration of brain, not elsewhere classified: Secondary | ICD-10-CM | POA: Diagnosis not present

## 2023-04-13 DIAGNOSIS — R001 Bradycardia, unspecified: Secondary | ICD-10-CM | POA: Diagnosis not present

## 2023-04-13 DIAGNOSIS — G20A1 Parkinson's disease without dyskinesia, without mention of fluctuations: Secondary | ICD-10-CM | POA: Diagnosis not present

## 2023-04-13 DIAGNOSIS — F0283 Dementia in other diseases classified elsewhere, unspecified severity, with mood disturbance: Secondary | ICD-10-CM | POA: Diagnosis not present

## 2023-04-13 DIAGNOSIS — R296 Repeated falls: Secondary | ICD-10-CM | POA: Diagnosis not present

## 2023-04-14 DIAGNOSIS — R001 Bradycardia, unspecified: Secondary | ICD-10-CM | POA: Diagnosis not present

## 2023-04-14 DIAGNOSIS — R296 Repeated falls: Secondary | ICD-10-CM | POA: Diagnosis not present

## 2023-04-14 DIAGNOSIS — I10 Essential (primary) hypertension: Secondary | ICD-10-CM | POA: Diagnosis not present

## 2023-04-14 DIAGNOSIS — G311 Senile degeneration of brain, not elsewhere classified: Secondary | ICD-10-CM | POA: Diagnosis not present

## 2023-04-14 DIAGNOSIS — F0283 Dementia in other diseases classified elsewhere, unspecified severity, with mood disturbance: Secondary | ICD-10-CM | POA: Diagnosis not present

## 2023-04-14 DIAGNOSIS — G20A1 Parkinson's disease without dyskinesia, without mention of fluctuations: Secondary | ICD-10-CM | POA: Diagnosis not present

## 2023-04-17 DIAGNOSIS — G311 Senile degeneration of brain, not elsewhere classified: Secondary | ICD-10-CM | POA: Diagnosis not present

## 2023-04-17 DIAGNOSIS — G20A1 Parkinson's disease without dyskinesia, without mention of fluctuations: Secondary | ICD-10-CM | POA: Diagnosis not present

## 2023-04-17 DIAGNOSIS — I10 Essential (primary) hypertension: Secondary | ICD-10-CM | POA: Diagnosis not present

## 2023-04-17 DIAGNOSIS — F331 Major depressive disorder, recurrent, moderate: Secondary | ICD-10-CM | POA: Diagnosis not present

## 2023-04-17 DIAGNOSIS — R001 Bradycardia, unspecified: Secondary | ICD-10-CM | POA: Diagnosis not present

## 2023-04-17 DIAGNOSIS — F0283 Dementia in other diseases classified elsewhere, unspecified severity, with mood disturbance: Secondary | ICD-10-CM | POA: Diagnosis not present

## 2023-04-17 DIAGNOSIS — J309 Allergic rhinitis, unspecified: Secondary | ICD-10-CM | POA: Diagnosis not present

## 2023-04-17 DIAGNOSIS — R296 Repeated falls: Secondary | ICD-10-CM | POA: Diagnosis not present

## 2023-04-20 DIAGNOSIS — R001 Bradycardia, unspecified: Secondary | ICD-10-CM | POA: Diagnosis not present

## 2023-04-20 DIAGNOSIS — G20A1 Parkinson's disease without dyskinesia, without mention of fluctuations: Secondary | ICD-10-CM | POA: Diagnosis not present

## 2023-04-20 DIAGNOSIS — I10 Essential (primary) hypertension: Secondary | ICD-10-CM | POA: Diagnosis not present

## 2023-04-20 DIAGNOSIS — R296 Repeated falls: Secondary | ICD-10-CM | POA: Diagnosis not present

## 2023-04-20 DIAGNOSIS — G311 Senile degeneration of brain, not elsewhere classified: Secondary | ICD-10-CM | POA: Diagnosis not present

## 2023-04-20 DIAGNOSIS — F0283 Dementia in other diseases classified elsewhere, unspecified severity, with mood disturbance: Secondary | ICD-10-CM | POA: Diagnosis not present

## 2023-04-21 DIAGNOSIS — I10 Essential (primary) hypertension: Secondary | ICD-10-CM | POA: Diagnosis not present

## 2023-04-21 DIAGNOSIS — R001 Bradycardia, unspecified: Secondary | ICD-10-CM | POA: Diagnosis not present

## 2023-04-21 DIAGNOSIS — F0283 Dementia in other diseases classified elsewhere, unspecified severity, with mood disturbance: Secondary | ICD-10-CM | POA: Diagnosis not present

## 2023-04-21 DIAGNOSIS — G311 Senile degeneration of brain, not elsewhere classified: Secondary | ICD-10-CM | POA: Diagnosis not present

## 2023-04-21 DIAGNOSIS — R296 Repeated falls: Secondary | ICD-10-CM | POA: Diagnosis not present

## 2023-04-21 DIAGNOSIS — G20A1 Parkinson's disease without dyskinesia, without mention of fluctuations: Secondary | ICD-10-CM | POA: Diagnosis not present

## 2023-04-23 DIAGNOSIS — I10 Essential (primary) hypertension: Secondary | ICD-10-CM | POA: Diagnosis not present

## 2023-04-23 DIAGNOSIS — F0283 Dementia in other diseases classified elsewhere, unspecified severity, with mood disturbance: Secondary | ICD-10-CM | POA: Diagnosis not present

## 2023-04-23 DIAGNOSIS — G20A1 Parkinson's disease without dyskinesia, without mention of fluctuations: Secondary | ICD-10-CM | POA: Diagnosis not present

## 2023-04-23 DIAGNOSIS — R001 Bradycardia, unspecified: Secondary | ICD-10-CM | POA: Diagnosis not present

## 2023-04-23 DIAGNOSIS — R296 Repeated falls: Secondary | ICD-10-CM | POA: Diagnosis not present

## 2023-04-23 DIAGNOSIS — G311 Senile degeneration of brain, not elsewhere classified: Secondary | ICD-10-CM | POA: Diagnosis not present

## 2023-04-24 DIAGNOSIS — G311 Senile degeneration of brain, not elsewhere classified: Secondary | ICD-10-CM | POA: Diagnosis not present

## 2023-04-24 DIAGNOSIS — G20A1 Parkinson's disease without dyskinesia, without mention of fluctuations: Secondary | ICD-10-CM | POA: Diagnosis not present

## 2023-04-24 DIAGNOSIS — F0283 Dementia in other diseases classified elsewhere, unspecified severity, with mood disturbance: Secondary | ICD-10-CM | POA: Diagnosis not present

## 2023-04-24 DIAGNOSIS — I10 Essential (primary) hypertension: Secondary | ICD-10-CM | POA: Diagnosis not present

## 2023-04-24 DIAGNOSIS — R001 Bradycardia, unspecified: Secondary | ICD-10-CM | POA: Diagnosis not present

## 2023-04-24 DIAGNOSIS — R296 Repeated falls: Secondary | ICD-10-CM | POA: Diagnosis not present

## 2023-04-26 DIAGNOSIS — G311 Senile degeneration of brain, not elsewhere classified: Secondary | ICD-10-CM | POA: Diagnosis not present

## 2023-04-26 DIAGNOSIS — R296 Repeated falls: Secondary | ICD-10-CM | POA: Diagnosis not present

## 2023-04-26 DIAGNOSIS — R001 Bradycardia, unspecified: Secondary | ICD-10-CM | POA: Diagnosis not present

## 2023-04-26 DIAGNOSIS — F0283 Dementia in other diseases classified elsewhere, unspecified severity, with mood disturbance: Secondary | ICD-10-CM | POA: Diagnosis not present

## 2023-04-26 DIAGNOSIS — I10 Essential (primary) hypertension: Secondary | ICD-10-CM | POA: Diagnosis not present

## 2023-04-26 DIAGNOSIS — G20A1 Parkinson's disease without dyskinesia, without mention of fluctuations: Secondary | ICD-10-CM | POA: Diagnosis not present

## 2023-04-27 DIAGNOSIS — G311 Senile degeneration of brain, not elsewhere classified: Secondary | ICD-10-CM | POA: Diagnosis not present

## 2023-04-27 DIAGNOSIS — F0283 Dementia in other diseases classified elsewhere, unspecified severity, with mood disturbance: Secondary | ICD-10-CM | POA: Diagnosis not present

## 2023-04-27 DIAGNOSIS — I10 Essential (primary) hypertension: Secondary | ICD-10-CM | POA: Diagnosis not present

## 2023-04-27 DIAGNOSIS — R296 Repeated falls: Secondary | ICD-10-CM | POA: Diagnosis not present

## 2023-04-27 DIAGNOSIS — G20A1 Parkinson's disease without dyskinesia, without mention of fluctuations: Secondary | ICD-10-CM | POA: Diagnosis not present

## 2023-04-27 DIAGNOSIS — R001 Bradycardia, unspecified: Secondary | ICD-10-CM | POA: Diagnosis not present

## 2023-04-28 DIAGNOSIS — R296 Repeated falls: Secondary | ICD-10-CM | POA: Diagnosis not present

## 2023-04-28 DIAGNOSIS — R001 Bradycardia, unspecified: Secondary | ICD-10-CM | POA: Diagnosis not present

## 2023-04-28 DIAGNOSIS — G20A1 Parkinson's disease without dyskinesia, without mention of fluctuations: Secondary | ICD-10-CM | POA: Diagnosis not present

## 2023-04-28 DIAGNOSIS — F0283 Dementia in other diseases classified elsewhere, unspecified severity, with mood disturbance: Secondary | ICD-10-CM | POA: Diagnosis not present

## 2023-04-28 DIAGNOSIS — G311 Senile degeneration of brain, not elsewhere classified: Secondary | ICD-10-CM | POA: Diagnosis not present

## 2023-04-28 DIAGNOSIS — I10 Essential (primary) hypertension: Secondary | ICD-10-CM | POA: Diagnosis not present

## 2023-05-01 DIAGNOSIS — F0283 Dementia in other diseases classified elsewhere, unspecified severity, with mood disturbance: Secondary | ICD-10-CM | POA: Diagnosis not present

## 2023-05-01 DIAGNOSIS — G20A1 Parkinson's disease without dyskinesia, without mention of fluctuations: Secondary | ICD-10-CM | POA: Diagnosis not present

## 2023-05-01 DIAGNOSIS — G311 Senile degeneration of brain, not elsewhere classified: Secondary | ICD-10-CM | POA: Diagnosis not present

## 2023-05-01 DIAGNOSIS — R296 Repeated falls: Secondary | ICD-10-CM | POA: Diagnosis not present

## 2023-05-01 DIAGNOSIS — R001 Bradycardia, unspecified: Secondary | ICD-10-CM | POA: Diagnosis not present

## 2023-05-01 DIAGNOSIS — I10 Essential (primary) hypertension: Secondary | ICD-10-CM | POA: Diagnosis not present

## 2023-05-03 DIAGNOSIS — R001 Bradycardia, unspecified: Secondary | ICD-10-CM | POA: Diagnosis not present

## 2023-05-03 DIAGNOSIS — F0283 Dementia in other diseases classified elsewhere, unspecified severity, with mood disturbance: Secondary | ICD-10-CM | POA: Diagnosis not present

## 2023-05-03 DIAGNOSIS — I10 Essential (primary) hypertension: Secondary | ICD-10-CM | POA: Diagnosis not present

## 2023-05-03 DIAGNOSIS — R296 Repeated falls: Secondary | ICD-10-CM | POA: Diagnosis not present

## 2023-05-03 DIAGNOSIS — G20A1 Parkinson's disease without dyskinesia, without mention of fluctuations: Secondary | ICD-10-CM | POA: Diagnosis not present

## 2023-05-03 DIAGNOSIS — G311 Senile degeneration of brain, not elsewhere classified: Secondary | ICD-10-CM | POA: Diagnosis not present

## 2023-05-04 DIAGNOSIS — I10 Essential (primary) hypertension: Secondary | ICD-10-CM | POA: Diagnosis not present

## 2023-05-04 DIAGNOSIS — G20A1 Parkinson's disease without dyskinesia, without mention of fluctuations: Secondary | ICD-10-CM | POA: Diagnosis not present

## 2023-05-04 DIAGNOSIS — G311 Senile degeneration of brain, not elsewhere classified: Secondary | ICD-10-CM | POA: Diagnosis not present

## 2023-05-04 DIAGNOSIS — F0283 Dementia in other diseases classified elsewhere, unspecified severity, with mood disturbance: Secondary | ICD-10-CM | POA: Diagnosis not present

## 2023-05-04 DIAGNOSIS — R296 Repeated falls: Secondary | ICD-10-CM | POA: Diagnosis not present

## 2023-05-04 DIAGNOSIS — R001 Bradycardia, unspecified: Secondary | ICD-10-CM | POA: Diagnosis not present

## 2023-05-05 DIAGNOSIS — G311 Senile degeneration of brain, not elsewhere classified: Secondary | ICD-10-CM | POA: Diagnosis not present

## 2023-05-05 DIAGNOSIS — I10 Essential (primary) hypertension: Secondary | ICD-10-CM | POA: Diagnosis not present

## 2023-05-05 DIAGNOSIS — R001 Bradycardia, unspecified: Secondary | ICD-10-CM | POA: Diagnosis not present

## 2023-05-05 DIAGNOSIS — F0283 Dementia in other diseases classified elsewhere, unspecified severity, with mood disturbance: Secondary | ICD-10-CM | POA: Diagnosis not present

## 2023-05-05 DIAGNOSIS — G20A1 Parkinson's disease without dyskinesia, without mention of fluctuations: Secondary | ICD-10-CM | POA: Diagnosis not present

## 2023-05-05 DIAGNOSIS — R296 Repeated falls: Secondary | ICD-10-CM | POA: Diagnosis not present

## 2023-05-08 DIAGNOSIS — F0283 Dementia in other diseases classified elsewhere, unspecified severity, with mood disturbance: Secondary | ICD-10-CM | POA: Diagnosis not present

## 2023-05-08 DIAGNOSIS — R001 Bradycardia, unspecified: Secondary | ICD-10-CM | POA: Diagnosis not present

## 2023-05-08 DIAGNOSIS — G20A1 Parkinson's disease without dyskinesia, without mention of fluctuations: Secondary | ICD-10-CM | POA: Diagnosis not present

## 2023-05-08 DIAGNOSIS — R296 Repeated falls: Secondary | ICD-10-CM | POA: Diagnosis not present

## 2023-05-08 DIAGNOSIS — G311 Senile degeneration of brain, not elsewhere classified: Secondary | ICD-10-CM | POA: Diagnosis not present

## 2023-05-08 DIAGNOSIS — I10 Essential (primary) hypertension: Secondary | ICD-10-CM | POA: Diagnosis not present

## 2023-05-09 DIAGNOSIS — F5101 Primary insomnia: Secondary | ICD-10-CM | POA: Diagnosis not present

## 2023-05-09 DIAGNOSIS — F01B0 Vascular dementia, moderate, without behavioral disturbance, psychotic disturbance, mood disturbance, and anxiety: Secondary | ICD-10-CM | POA: Diagnosis not present

## 2023-05-09 DIAGNOSIS — F062 Psychotic disorder with delusions due to known physiological condition: Secondary | ICD-10-CM | POA: Diagnosis not present

## 2023-05-09 DIAGNOSIS — F331 Major depressive disorder, recurrent, moderate: Secondary | ICD-10-CM | POA: Diagnosis not present

## 2023-05-09 DIAGNOSIS — G20B1 Parkinson's disease with dyskinesia, without mention of fluctuations: Secondary | ICD-10-CM | POA: Diagnosis not present

## 2023-05-09 DIAGNOSIS — R443 Hallucinations, unspecified: Secondary | ICD-10-CM | POA: Diagnosis not present

## 2023-05-11 DIAGNOSIS — G311 Senile degeneration of brain, not elsewhere classified: Secondary | ICD-10-CM | POA: Diagnosis not present

## 2023-05-11 DIAGNOSIS — R001 Bradycardia, unspecified: Secondary | ICD-10-CM | POA: Diagnosis not present

## 2023-05-11 DIAGNOSIS — F0283 Dementia in other diseases classified elsewhere, unspecified severity, with mood disturbance: Secondary | ICD-10-CM | POA: Diagnosis not present

## 2023-05-11 DIAGNOSIS — G20A1 Parkinson's disease without dyskinesia, without mention of fluctuations: Secondary | ICD-10-CM | POA: Diagnosis not present

## 2023-05-11 DIAGNOSIS — R296 Repeated falls: Secondary | ICD-10-CM | POA: Diagnosis not present

## 2023-05-11 DIAGNOSIS — I10 Essential (primary) hypertension: Secondary | ICD-10-CM | POA: Diagnosis not present

## 2023-05-12 DIAGNOSIS — F0283 Dementia in other diseases classified elsewhere, unspecified severity, with mood disturbance: Secondary | ICD-10-CM | POA: Diagnosis not present

## 2023-05-12 DIAGNOSIS — G3183 Dementia with Lewy bodies: Secondary | ICD-10-CM | POA: Diagnosis not present

## 2023-05-12 DIAGNOSIS — R001 Bradycardia, unspecified: Secondary | ICD-10-CM | POA: Diagnosis not present

## 2023-05-12 DIAGNOSIS — G311 Senile degeneration of brain, not elsewhere classified: Secondary | ICD-10-CM | POA: Diagnosis not present

## 2023-05-12 DIAGNOSIS — R296 Repeated falls: Secondary | ICD-10-CM | POA: Diagnosis not present

## 2023-05-12 DIAGNOSIS — I10 Essential (primary) hypertension: Secondary | ICD-10-CM | POA: Diagnosis not present

## 2023-05-12 DIAGNOSIS — G20A1 Parkinson's disease without dyskinesia, without mention of fluctuations: Secondary | ICD-10-CM | POA: Diagnosis not present

## 2023-05-12 DIAGNOSIS — H409 Unspecified glaucoma: Secondary | ICD-10-CM | POA: Diagnosis not present

## 2023-05-13 DIAGNOSIS — E785 Hyperlipidemia, unspecified: Secondary | ICD-10-CM | POA: Diagnosis not present

## 2023-05-13 DIAGNOSIS — H409 Unspecified glaucoma: Secondary | ICD-10-CM | POA: Diagnosis not present

## 2023-05-13 DIAGNOSIS — L249 Irritant contact dermatitis, unspecified cause: Secondary | ICD-10-CM | POA: Diagnosis not present

## 2023-05-13 DIAGNOSIS — G20A1 Parkinson's disease without dyskinesia, without mention of fluctuations: Secondary | ICD-10-CM | POA: Diagnosis not present

## 2023-05-13 DIAGNOSIS — R001 Bradycardia, unspecified: Secondary | ICD-10-CM | POA: Diagnosis not present

## 2023-05-13 DIAGNOSIS — I10 Essential (primary) hypertension: Secondary | ICD-10-CM | POA: Diagnosis not present

## 2023-05-13 DIAGNOSIS — R296 Repeated falls: Secondary | ICD-10-CM | POA: Diagnosis not present

## 2023-05-13 DIAGNOSIS — G311 Senile degeneration of brain, not elsewhere classified: Secondary | ICD-10-CM | POA: Diagnosis not present

## 2023-05-13 DIAGNOSIS — J302 Other seasonal allergic rhinitis: Secondary | ICD-10-CM | POA: Diagnosis not present

## 2023-05-13 DIAGNOSIS — F0283 Dementia in other diseases classified elsewhere, unspecified severity, with mood disturbance: Secondary | ICD-10-CM | POA: Diagnosis not present

## 2023-05-15 DIAGNOSIS — R296 Repeated falls: Secondary | ICD-10-CM | POA: Diagnosis not present

## 2023-05-15 DIAGNOSIS — R001 Bradycardia, unspecified: Secondary | ICD-10-CM | POA: Diagnosis not present

## 2023-05-15 DIAGNOSIS — G311 Senile degeneration of brain, not elsewhere classified: Secondary | ICD-10-CM | POA: Diagnosis not present

## 2023-05-15 DIAGNOSIS — G20A1 Parkinson's disease without dyskinesia, without mention of fluctuations: Secondary | ICD-10-CM | POA: Diagnosis not present

## 2023-05-15 DIAGNOSIS — F331 Major depressive disorder, recurrent, moderate: Secondary | ICD-10-CM | POA: Diagnosis not present

## 2023-05-15 DIAGNOSIS — G20B1 Parkinson's disease with dyskinesia, without mention of fluctuations: Secondary | ICD-10-CM | POA: Diagnosis not present

## 2023-05-15 DIAGNOSIS — F5101 Primary insomnia: Secondary | ICD-10-CM | POA: Diagnosis not present

## 2023-05-15 DIAGNOSIS — I10 Essential (primary) hypertension: Secondary | ICD-10-CM | POA: Diagnosis not present

## 2023-05-15 DIAGNOSIS — F0283 Dementia in other diseases classified elsewhere, unspecified severity, with mood disturbance: Secondary | ICD-10-CM | POA: Diagnosis not present

## 2023-05-16 DIAGNOSIS — I10 Essential (primary) hypertension: Secondary | ICD-10-CM | POA: Diagnosis not present

## 2023-05-16 DIAGNOSIS — G20A1 Parkinson's disease without dyskinesia, without mention of fluctuations: Secondary | ICD-10-CM | POA: Diagnosis not present

## 2023-05-16 DIAGNOSIS — R001 Bradycardia, unspecified: Secondary | ICD-10-CM | POA: Diagnosis not present

## 2023-05-16 DIAGNOSIS — R296 Repeated falls: Secondary | ICD-10-CM | POA: Diagnosis not present

## 2023-05-16 DIAGNOSIS — G311 Senile degeneration of brain, not elsewhere classified: Secondary | ICD-10-CM | POA: Diagnosis not present

## 2023-05-16 DIAGNOSIS — F0283 Dementia in other diseases classified elsewhere, unspecified severity, with mood disturbance: Secondary | ICD-10-CM | POA: Diagnosis not present

## 2023-05-18 DIAGNOSIS — R001 Bradycardia, unspecified: Secondary | ICD-10-CM | POA: Diagnosis not present

## 2023-05-18 DIAGNOSIS — I10 Essential (primary) hypertension: Secondary | ICD-10-CM | POA: Diagnosis not present

## 2023-05-18 DIAGNOSIS — R296 Repeated falls: Secondary | ICD-10-CM | POA: Diagnosis not present

## 2023-05-18 DIAGNOSIS — G20A1 Parkinson's disease without dyskinesia, without mention of fluctuations: Secondary | ICD-10-CM | POA: Diagnosis not present

## 2023-05-18 DIAGNOSIS — G311 Senile degeneration of brain, not elsewhere classified: Secondary | ICD-10-CM | POA: Diagnosis not present

## 2023-05-18 DIAGNOSIS — F0283 Dementia in other diseases classified elsewhere, unspecified severity, with mood disturbance: Secondary | ICD-10-CM | POA: Diagnosis not present

## 2023-05-18 DIAGNOSIS — B351 Tinea unguium: Secondary | ICD-10-CM | POA: Diagnosis not present

## 2023-05-18 DIAGNOSIS — M2041 Other hammer toe(s) (acquired), right foot: Secondary | ICD-10-CM | POA: Diagnosis not present

## 2023-05-19 DIAGNOSIS — G20A1 Parkinson's disease without dyskinesia, without mention of fluctuations: Secondary | ICD-10-CM | POA: Diagnosis not present

## 2023-05-19 DIAGNOSIS — F0283 Dementia in other diseases classified elsewhere, unspecified severity, with mood disturbance: Secondary | ICD-10-CM | POA: Diagnosis not present

## 2023-05-19 DIAGNOSIS — I10 Essential (primary) hypertension: Secondary | ICD-10-CM | POA: Diagnosis not present

## 2023-05-19 DIAGNOSIS — G311 Senile degeneration of brain, not elsewhere classified: Secondary | ICD-10-CM | POA: Diagnosis not present

## 2023-05-19 DIAGNOSIS — R001 Bradycardia, unspecified: Secondary | ICD-10-CM | POA: Diagnosis not present

## 2023-05-19 DIAGNOSIS — R296 Repeated falls: Secondary | ICD-10-CM | POA: Diagnosis not present

## 2023-05-22 DIAGNOSIS — I10 Essential (primary) hypertension: Secondary | ICD-10-CM | POA: Diagnosis not present

## 2023-05-22 DIAGNOSIS — R296 Repeated falls: Secondary | ICD-10-CM | POA: Diagnosis not present

## 2023-05-22 DIAGNOSIS — R001 Bradycardia, unspecified: Secondary | ICD-10-CM | POA: Diagnosis not present

## 2023-05-22 DIAGNOSIS — F0283 Dementia in other diseases classified elsewhere, unspecified severity, with mood disturbance: Secondary | ICD-10-CM | POA: Diagnosis not present

## 2023-05-22 DIAGNOSIS — G311 Senile degeneration of brain, not elsewhere classified: Secondary | ICD-10-CM | POA: Diagnosis not present

## 2023-05-22 DIAGNOSIS — G20A1 Parkinson's disease without dyskinesia, without mention of fluctuations: Secondary | ICD-10-CM | POA: Diagnosis not present

## 2023-05-24 DIAGNOSIS — I10 Essential (primary) hypertension: Secondary | ICD-10-CM | POA: Diagnosis not present

## 2023-05-24 DIAGNOSIS — G311 Senile degeneration of brain, not elsewhere classified: Secondary | ICD-10-CM | POA: Diagnosis not present

## 2023-05-24 DIAGNOSIS — R001 Bradycardia, unspecified: Secondary | ICD-10-CM | POA: Diagnosis not present

## 2023-05-24 DIAGNOSIS — R296 Repeated falls: Secondary | ICD-10-CM | POA: Diagnosis not present

## 2023-05-24 DIAGNOSIS — F0283 Dementia in other diseases classified elsewhere, unspecified severity, with mood disturbance: Secondary | ICD-10-CM | POA: Diagnosis not present

## 2023-05-24 DIAGNOSIS — G20A1 Parkinson's disease without dyskinesia, without mention of fluctuations: Secondary | ICD-10-CM | POA: Diagnosis not present

## 2023-05-25 DIAGNOSIS — I10 Essential (primary) hypertension: Secondary | ICD-10-CM | POA: Diagnosis not present

## 2023-05-25 DIAGNOSIS — R001 Bradycardia, unspecified: Secondary | ICD-10-CM | POA: Diagnosis not present

## 2023-05-25 DIAGNOSIS — G311 Senile degeneration of brain, not elsewhere classified: Secondary | ICD-10-CM | POA: Diagnosis not present

## 2023-05-25 DIAGNOSIS — G20A1 Parkinson's disease without dyskinesia, without mention of fluctuations: Secondary | ICD-10-CM | POA: Diagnosis not present

## 2023-05-25 DIAGNOSIS — F0283 Dementia in other diseases classified elsewhere, unspecified severity, with mood disturbance: Secondary | ICD-10-CM | POA: Diagnosis not present

## 2023-05-25 DIAGNOSIS — R296 Repeated falls: Secondary | ICD-10-CM | POA: Diagnosis not present

## 2023-05-26 DIAGNOSIS — G20A1 Parkinson's disease without dyskinesia, without mention of fluctuations: Secondary | ICD-10-CM | POA: Diagnosis not present

## 2023-05-26 DIAGNOSIS — G311 Senile degeneration of brain, not elsewhere classified: Secondary | ICD-10-CM | POA: Diagnosis not present

## 2023-05-26 DIAGNOSIS — R296 Repeated falls: Secondary | ICD-10-CM | POA: Diagnosis not present

## 2023-05-26 DIAGNOSIS — R001 Bradycardia, unspecified: Secondary | ICD-10-CM | POA: Diagnosis not present

## 2023-05-26 DIAGNOSIS — F0283 Dementia in other diseases classified elsewhere, unspecified severity, with mood disturbance: Secondary | ICD-10-CM | POA: Diagnosis not present

## 2023-05-26 DIAGNOSIS — I10 Essential (primary) hypertension: Secondary | ICD-10-CM | POA: Diagnosis not present

## 2023-05-29 DIAGNOSIS — G311 Senile degeneration of brain, not elsewhere classified: Secondary | ICD-10-CM | POA: Diagnosis not present

## 2023-05-29 DIAGNOSIS — R001 Bradycardia, unspecified: Secondary | ICD-10-CM | POA: Diagnosis not present

## 2023-05-29 DIAGNOSIS — I10 Essential (primary) hypertension: Secondary | ICD-10-CM | POA: Diagnosis not present

## 2023-05-29 DIAGNOSIS — F0283 Dementia in other diseases classified elsewhere, unspecified severity, with mood disturbance: Secondary | ICD-10-CM | POA: Diagnosis not present

## 2023-05-29 DIAGNOSIS — R296 Repeated falls: Secondary | ICD-10-CM | POA: Diagnosis not present

## 2023-05-29 DIAGNOSIS — G20A1 Parkinson's disease without dyskinesia, without mention of fluctuations: Secondary | ICD-10-CM | POA: Diagnosis not present

## 2023-05-31 DIAGNOSIS — F0283 Dementia in other diseases classified elsewhere, unspecified severity, with mood disturbance: Secondary | ICD-10-CM | POA: Diagnosis not present

## 2023-05-31 DIAGNOSIS — I10 Essential (primary) hypertension: Secondary | ICD-10-CM | POA: Diagnosis not present

## 2023-05-31 DIAGNOSIS — R296 Repeated falls: Secondary | ICD-10-CM | POA: Diagnosis not present

## 2023-05-31 DIAGNOSIS — G20A1 Parkinson's disease without dyskinesia, without mention of fluctuations: Secondary | ICD-10-CM | POA: Diagnosis not present

## 2023-05-31 DIAGNOSIS — G311 Senile degeneration of brain, not elsewhere classified: Secondary | ICD-10-CM | POA: Diagnosis not present

## 2023-05-31 DIAGNOSIS — R001 Bradycardia, unspecified: Secondary | ICD-10-CM | POA: Diagnosis not present

## 2023-06-01 DIAGNOSIS — G311 Senile degeneration of brain, not elsewhere classified: Secondary | ICD-10-CM | POA: Diagnosis not present

## 2023-06-01 DIAGNOSIS — G20A1 Parkinson's disease without dyskinesia, without mention of fluctuations: Secondary | ICD-10-CM | POA: Diagnosis not present

## 2023-06-01 DIAGNOSIS — R001 Bradycardia, unspecified: Secondary | ICD-10-CM | POA: Diagnosis not present

## 2023-06-01 DIAGNOSIS — R296 Repeated falls: Secondary | ICD-10-CM | POA: Diagnosis not present

## 2023-06-01 DIAGNOSIS — I10 Essential (primary) hypertension: Secondary | ICD-10-CM | POA: Diagnosis not present

## 2023-06-01 DIAGNOSIS — F0283 Dementia in other diseases classified elsewhere, unspecified severity, with mood disturbance: Secondary | ICD-10-CM | POA: Diagnosis not present

## 2023-06-02 DIAGNOSIS — G311 Senile degeneration of brain, not elsewhere classified: Secondary | ICD-10-CM | POA: Diagnosis not present

## 2023-06-02 DIAGNOSIS — F0283 Dementia in other diseases classified elsewhere, unspecified severity, with mood disturbance: Secondary | ICD-10-CM | POA: Diagnosis not present

## 2023-06-02 DIAGNOSIS — R296 Repeated falls: Secondary | ICD-10-CM | POA: Diagnosis not present

## 2023-06-02 DIAGNOSIS — R001 Bradycardia, unspecified: Secondary | ICD-10-CM | POA: Diagnosis not present

## 2023-06-02 DIAGNOSIS — G20A1 Parkinson's disease without dyskinesia, without mention of fluctuations: Secondary | ICD-10-CM | POA: Diagnosis not present

## 2023-06-02 DIAGNOSIS — I10 Essential (primary) hypertension: Secondary | ICD-10-CM | POA: Diagnosis not present

## 2023-06-05 DIAGNOSIS — F0283 Dementia in other diseases classified elsewhere, unspecified severity, with mood disturbance: Secondary | ICD-10-CM | POA: Diagnosis not present

## 2023-06-05 DIAGNOSIS — G311 Senile degeneration of brain, not elsewhere classified: Secondary | ICD-10-CM | POA: Diagnosis not present

## 2023-06-05 DIAGNOSIS — R001 Bradycardia, unspecified: Secondary | ICD-10-CM | POA: Diagnosis not present

## 2023-06-05 DIAGNOSIS — I10 Essential (primary) hypertension: Secondary | ICD-10-CM | POA: Diagnosis not present

## 2023-06-05 DIAGNOSIS — G20A1 Parkinson's disease without dyskinesia, without mention of fluctuations: Secondary | ICD-10-CM | POA: Diagnosis not present

## 2023-06-05 DIAGNOSIS — R296 Repeated falls: Secondary | ICD-10-CM | POA: Diagnosis not present

## 2023-06-08 DIAGNOSIS — G311 Senile degeneration of brain, not elsewhere classified: Secondary | ICD-10-CM | POA: Diagnosis not present

## 2023-06-08 DIAGNOSIS — F0283 Dementia in other diseases classified elsewhere, unspecified severity, with mood disturbance: Secondary | ICD-10-CM | POA: Diagnosis not present

## 2023-06-08 DIAGNOSIS — R001 Bradycardia, unspecified: Secondary | ICD-10-CM | POA: Diagnosis not present

## 2023-06-08 DIAGNOSIS — I10 Essential (primary) hypertension: Secondary | ICD-10-CM | POA: Diagnosis not present

## 2023-06-08 DIAGNOSIS — H409 Unspecified glaucoma: Secondary | ICD-10-CM | POA: Diagnosis not present

## 2023-06-08 DIAGNOSIS — R296 Repeated falls: Secondary | ICD-10-CM | POA: Diagnosis not present

## 2023-06-08 DIAGNOSIS — G20A1 Parkinson's disease without dyskinesia, without mention of fluctuations: Secondary | ICD-10-CM | POA: Diagnosis not present

## 2023-06-08 DIAGNOSIS — G3183 Dementia with Lewy bodies: Secondary | ICD-10-CM | POA: Diagnosis not present

## 2023-06-09 DIAGNOSIS — R001 Bradycardia, unspecified: Secondary | ICD-10-CM | POA: Diagnosis not present

## 2023-06-09 DIAGNOSIS — G20A1 Parkinson's disease without dyskinesia, without mention of fluctuations: Secondary | ICD-10-CM | POA: Diagnosis not present

## 2023-06-09 DIAGNOSIS — F062 Psychotic disorder with delusions due to known physiological condition: Secondary | ICD-10-CM | POA: Diagnosis not present

## 2023-06-09 DIAGNOSIS — R443 Hallucinations, unspecified: Secondary | ICD-10-CM | POA: Diagnosis not present

## 2023-06-09 DIAGNOSIS — F411 Generalized anxiety disorder: Secondary | ICD-10-CM | POA: Diagnosis not present

## 2023-06-09 DIAGNOSIS — G311 Senile degeneration of brain, not elsewhere classified: Secondary | ICD-10-CM | POA: Diagnosis not present

## 2023-06-09 DIAGNOSIS — F0283 Dementia in other diseases classified elsewhere, unspecified severity, with mood disturbance: Secondary | ICD-10-CM | POA: Diagnosis not present

## 2023-06-09 DIAGNOSIS — R296 Repeated falls: Secondary | ICD-10-CM | POA: Diagnosis not present

## 2023-06-09 DIAGNOSIS — G20B2 Parkinson's disease with dyskinesia, with fluctuations: Secondary | ICD-10-CM | POA: Diagnosis not present

## 2023-06-09 DIAGNOSIS — I10 Essential (primary) hypertension: Secondary | ICD-10-CM | POA: Diagnosis not present

## 2023-06-09 DIAGNOSIS — F331 Major depressive disorder, recurrent, moderate: Secondary | ICD-10-CM | POA: Diagnosis not present

## 2023-06-12 DIAGNOSIS — R001 Bradycardia, unspecified: Secondary | ICD-10-CM | POA: Diagnosis not present

## 2023-06-12 DIAGNOSIS — F331 Major depressive disorder, recurrent, moderate: Secondary | ICD-10-CM | POA: Diagnosis not present

## 2023-06-12 DIAGNOSIS — R32 Unspecified urinary incontinence: Secondary | ICD-10-CM | POA: Diagnosis not present

## 2023-06-12 DIAGNOSIS — J302 Other seasonal allergic rhinitis: Secondary | ICD-10-CM | POA: Diagnosis not present

## 2023-06-12 DIAGNOSIS — R296 Repeated falls: Secondary | ICD-10-CM | POA: Diagnosis not present

## 2023-06-12 DIAGNOSIS — F5101 Primary insomnia: Secondary | ICD-10-CM | POA: Diagnosis not present

## 2023-06-12 DIAGNOSIS — F0283 Dementia in other diseases classified elsewhere, unspecified severity, with mood disturbance: Secondary | ICD-10-CM | POA: Diagnosis not present

## 2023-06-12 DIAGNOSIS — L249 Irritant contact dermatitis, unspecified cause: Secondary | ICD-10-CM | POA: Diagnosis not present

## 2023-06-12 DIAGNOSIS — I10 Essential (primary) hypertension: Secondary | ICD-10-CM | POA: Diagnosis not present

## 2023-06-12 DIAGNOSIS — G20B1 Parkinson's disease with dyskinesia, without mention of fluctuations: Secondary | ICD-10-CM | POA: Diagnosis not present

## 2023-06-12 DIAGNOSIS — R159 Full incontinence of feces: Secondary | ICD-10-CM | POA: Diagnosis not present

## 2023-06-12 DIAGNOSIS — G20A1 Parkinson's disease without dyskinesia, without mention of fluctuations: Secondary | ICD-10-CM | POA: Diagnosis not present

## 2023-06-12 DIAGNOSIS — H409 Unspecified glaucoma: Secondary | ICD-10-CM | POA: Diagnosis not present

## 2023-06-12 DIAGNOSIS — Z79899 Other long term (current) drug therapy: Secondary | ICD-10-CM | POA: Diagnosis not present

## 2023-06-12 DIAGNOSIS — G311 Senile degeneration of brain, not elsewhere classified: Secondary | ICD-10-CM | POA: Diagnosis not present

## 2023-06-12 DIAGNOSIS — E785 Hyperlipidemia, unspecified: Secondary | ICD-10-CM | POA: Diagnosis not present

## 2023-06-14 DIAGNOSIS — R296 Repeated falls: Secondary | ICD-10-CM | POA: Diagnosis not present

## 2023-06-14 DIAGNOSIS — F0283 Dementia in other diseases classified elsewhere, unspecified severity, with mood disturbance: Secondary | ICD-10-CM | POA: Diagnosis not present

## 2023-06-14 DIAGNOSIS — G20A1 Parkinson's disease without dyskinesia, without mention of fluctuations: Secondary | ICD-10-CM | POA: Diagnosis not present

## 2023-06-14 DIAGNOSIS — R001 Bradycardia, unspecified: Secondary | ICD-10-CM | POA: Diagnosis not present

## 2023-06-14 DIAGNOSIS — G311 Senile degeneration of brain, not elsewhere classified: Secondary | ICD-10-CM | POA: Diagnosis not present

## 2023-06-14 DIAGNOSIS — I10 Essential (primary) hypertension: Secondary | ICD-10-CM | POA: Diagnosis not present

## 2023-06-16 DIAGNOSIS — R001 Bradycardia, unspecified: Secondary | ICD-10-CM | POA: Diagnosis not present

## 2023-06-16 DIAGNOSIS — G20A1 Parkinson's disease without dyskinesia, without mention of fluctuations: Secondary | ICD-10-CM | POA: Diagnosis not present

## 2023-06-16 DIAGNOSIS — R296 Repeated falls: Secondary | ICD-10-CM | POA: Diagnosis not present

## 2023-06-16 DIAGNOSIS — I10 Essential (primary) hypertension: Secondary | ICD-10-CM | POA: Diagnosis not present

## 2023-06-16 DIAGNOSIS — G311 Senile degeneration of brain, not elsewhere classified: Secondary | ICD-10-CM | POA: Diagnosis not present

## 2023-06-16 DIAGNOSIS — F0283 Dementia in other diseases classified elsewhere, unspecified severity, with mood disturbance: Secondary | ICD-10-CM | POA: Diagnosis not present

## 2023-06-19 DIAGNOSIS — G311 Senile degeneration of brain, not elsewhere classified: Secondary | ICD-10-CM | POA: Diagnosis not present

## 2023-06-19 DIAGNOSIS — G20A1 Parkinson's disease without dyskinesia, without mention of fluctuations: Secondary | ICD-10-CM | POA: Diagnosis not present

## 2023-06-19 DIAGNOSIS — F0283 Dementia in other diseases classified elsewhere, unspecified severity, with mood disturbance: Secondary | ICD-10-CM | POA: Diagnosis not present

## 2023-06-19 DIAGNOSIS — R296 Repeated falls: Secondary | ICD-10-CM | POA: Diagnosis not present

## 2023-06-19 DIAGNOSIS — R001 Bradycardia, unspecified: Secondary | ICD-10-CM | POA: Diagnosis not present

## 2023-06-19 DIAGNOSIS — I10 Essential (primary) hypertension: Secondary | ICD-10-CM | POA: Diagnosis not present

## 2023-06-21 DIAGNOSIS — G20A1 Parkinson's disease without dyskinesia, without mention of fluctuations: Secondary | ICD-10-CM | POA: Diagnosis not present

## 2023-06-21 DIAGNOSIS — I10 Essential (primary) hypertension: Secondary | ICD-10-CM | POA: Diagnosis not present

## 2023-06-21 DIAGNOSIS — R001 Bradycardia, unspecified: Secondary | ICD-10-CM | POA: Diagnosis not present

## 2023-06-21 DIAGNOSIS — F0283 Dementia in other diseases classified elsewhere, unspecified severity, with mood disturbance: Secondary | ICD-10-CM | POA: Diagnosis not present

## 2023-06-21 DIAGNOSIS — R296 Repeated falls: Secondary | ICD-10-CM | POA: Diagnosis not present

## 2023-06-21 DIAGNOSIS — G311 Senile degeneration of brain, not elsewhere classified: Secondary | ICD-10-CM | POA: Diagnosis not present

## 2023-06-23 DIAGNOSIS — R001 Bradycardia, unspecified: Secondary | ICD-10-CM | POA: Diagnosis not present

## 2023-06-23 DIAGNOSIS — G20A1 Parkinson's disease without dyskinesia, without mention of fluctuations: Secondary | ICD-10-CM | POA: Diagnosis not present

## 2023-06-23 DIAGNOSIS — G311 Senile degeneration of brain, not elsewhere classified: Secondary | ICD-10-CM | POA: Diagnosis not present

## 2023-06-23 DIAGNOSIS — I10 Essential (primary) hypertension: Secondary | ICD-10-CM | POA: Diagnosis not present

## 2023-06-23 DIAGNOSIS — F0283 Dementia in other diseases classified elsewhere, unspecified severity, with mood disturbance: Secondary | ICD-10-CM | POA: Diagnosis not present

## 2023-06-23 DIAGNOSIS — R296 Repeated falls: Secondary | ICD-10-CM | POA: Diagnosis not present

## 2023-06-26 DIAGNOSIS — R296 Repeated falls: Secondary | ICD-10-CM | POA: Diagnosis not present

## 2023-06-26 DIAGNOSIS — F0283 Dementia in other diseases classified elsewhere, unspecified severity, with mood disturbance: Secondary | ICD-10-CM | POA: Diagnosis not present

## 2023-06-26 DIAGNOSIS — G20A1 Parkinson's disease without dyskinesia, without mention of fluctuations: Secondary | ICD-10-CM | POA: Diagnosis not present

## 2023-06-26 DIAGNOSIS — G311 Senile degeneration of brain, not elsewhere classified: Secondary | ICD-10-CM | POA: Diagnosis not present

## 2023-06-26 DIAGNOSIS — R001 Bradycardia, unspecified: Secondary | ICD-10-CM | POA: Diagnosis not present

## 2023-06-26 DIAGNOSIS — I10 Essential (primary) hypertension: Secondary | ICD-10-CM | POA: Diagnosis not present

## 2023-06-27 DIAGNOSIS — R443 Hallucinations, unspecified: Secondary | ICD-10-CM | POA: Diagnosis not present

## 2023-06-27 DIAGNOSIS — F411 Generalized anxiety disorder: Secondary | ICD-10-CM | POA: Diagnosis not present

## 2023-06-27 DIAGNOSIS — F062 Psychotic disorder with delusions due to known physiological condition: Secondary | ICD-10-CM | POA: Diagnosis not present

## 2023-06-27 DIAGNOSIS — F331 Major depressive disorder, recurrent, moderate: Secondary | ICD-10-CM | POA: Diagnosis not present

## 2023-06-28 DIAGNOSIS — G311 Senile degeneration of brain, not elsewhere classified: Secondary | ICD-10-CM | POA: Diagnosis not present

## 2023-06-28 DIAGNOSIS — I10 Essential (primary) hypertension: Secondary | ICD-10-CM | POA: Diagnosis not present

## 2023-06-28 DIAGNOSIS — G20A1 Parkinson's disease without dyskinesia, without mention of fluctuations: Secondary | ICD-10-CM | POA: Diagnosis not present

## 2023-06-28 DIAGNOSIS — R001 Bradycardia, unspecified: Secondary | ICD-10-CM | POA: Diagnosis not present

## 2023-06-28 DIAGNOSIS — F0283 Dementia in other diseases classified elsewhere, unspecified severity, with mood disturbance: Secondary | ICD-10-CM | POA: Diagnosis not present

## 2023-06-28 DIAGNOSIS — R296 Repeated falls: Secondary | ICD-10-CM | POA: Diagnosis not present

## 2023-06-30 DIAGNOSIS — R001 Bradycardia, unspecified: Secondary | ICD-10-CM | POA: Diagnosis not present

## 2023-06-30 DIAGNOSIS — F0283 Dementia in other diseases classified elsewhere, unspecified severity, with mood disturbance: Secondary | ICD-10-CM | POA: Diagnosis not present

## 2023-06-30 DIAGNOSIS — R296 Repeated falls: Secondary | ICD-10-CM | POA: Diagnosis not present

## 2023-06-30 DIAGNOSIS — I10 Essential (primary) hypertension: Secondary | ICD-10-CM | POA: Diagnosis not present

## 2023-06-30 DIAGNOSIS — G311 Senile degeneration of brain, not elsewhere classified: Secondary | ICD-10-CM | POA: Diagnosis not present

## 2023-06-30 DIAGNOSIS — G20A1 Parkinson's disease without dyskinesia, without mention of fluctuations: Secondary | ICD-10-CM | POA: Diagnosis not present

## 2023-07-03 DIAGNOSIS — G20A1 Parkinson's disease without dyskinesia, without mention of fluctuations: Secondary | ICD-10-CM | POA: Diagnosis not present

## 2023-07-03 DIAGNOSIS — R296 Repeated falls: Secondary | ICD-10-CM | POA: Diagnosis not present

## 2023-07-03 DIAGNOSIS — I10 Essential (primary) hypertension: Secondary | ICD-10-CM | POA: Diagnosis not present

## 2023-07-03 DIAGNOSIS — R001 Bradycardia, unspecified: Secondary | ICD-10-CM | POA: Diagnosis not present

## 2023-07-03 DIAGNOSIS — G311 Senile degeneration of brain, not elsewhere classified: Secondary | ICD-10-CM | POA: Diagnosis not present

## 2023-07-03 DIAGNOSIS — F0283 Dementia in other diseases classified elsewhere, unspecified severity, with mood disturbance: Secondary | ICD-10-CM | POA: Diagnosis not present

## 2023-07-05 DIAGNOSIS — G20A1 Parkinson's disease without dyskinesia, without mention of fluctuations: Secondary | ICD-10-CM | POA: Diagnosis not present

## 2023-07-05 DIAGNOSIS — G20B1 Parkinson's disease with dyskinesia, without mention of fluctuations: Secondary | ICD-10-CM | POA: Diagnosis not present

## 2023-07-05 DIAGNOSIS — G3183 Dementia with Lewy bodies: Secondary | ICD-10-CM | POA: Diagnosis not present

## 2023-07-05 DIAGNOSIS — G311 Senile degeneration of brain, not elsewhere classified: Secondary | ICD-10-CM | POA: Diagnosis not present

## 2023-07-05 DIAGNOSIS — Z87440 Personal history of urinary (tract) infections: Secondary | ICD-10-CM | POA: Diagnosis not present

## 2023-07-05 DIAGNOSIS — R001 Bradycardia, unspecified: Secondary | ICD-10-CM | POA: Diagnosis not present

## 2023-07-05 DIAGNOSIS — F0280 Dementia in other diseases classified elsewhere without behavioral disturbance: Secondary | ICD-10-CM | POA: Diagnosis not present

## 2023-07-05 DIAGNOSIS — F331 Major depressive disorder, recurrent, moderate: Secondary | ICD-10-CM | POA: Diagnosis not present

## 2023-07-05 DIAGNOSIS — I10 Essential (primary) hypertension: Secondary | ICD-10-CM | POA: Diagnosis not present

## 2023-07-05 DIAGNOSIS — R296 Repeated falls: Secondary | ICD-10-CM | POA: Diagnosis not present

## 2023-07-05 DIAGNOSIS — F0283 Dementia in other diseases classified elsewhere, unspecified severity, with mood disturbance: Secondary | ICD-10-CM | POA: Diagnosis not present

## 2023-07-05 DIAGNOSIS — Z9181 History of falling: Secondary | ICD-10-CM | POA: Diagnosis not present

## 2023-07-06 DIAGNOSIS — G301 Alzheimer's disease with late onset: Secondary | ICD-10-CM | POA: Diagnosis not present

## 2023-07-06 DIAGNOSIS — I10 Essential (primary) hypertension: Secondary | ICD-10-CM | POA: Diagnosis not present

## 2023-07-07 DIAGNOSIS — I10 Essential (primary) hypertension: Secondary | ICD-10-CM | POA: Diagnosis not present

## 2023-07-07 DIAGNOSIS — R296 Repeated falls: Secondary | ICD-10-CM | POA: Diagnosis not present

## 2023-07-07 DIAGNOSIS — F0283 Dementia in other diseases classified elsewhere, unspecified severity, with mood disturbance: Secondary | ICD-10-CM | POA: Diagnosis not present

## 2023-07-07 DIAGNOSIS — G311 Senile degeneration of brain, not elsewhere classified: Secondary | ICD-10-CM | POA: Diagnosis not present

## 2023-07-07 DIAGNOSIS — G20A1 Parkinson's disease without dyskinesia, without mention of fluctuations: Secondary | ICD-10-CM | POA: Diagnosis not present

## 2023-07-07 DIAGNOSIS — R001 Bradycardia, unspecified: Secondary | ICD-10-CM | POA: Diagnosis not present

## 2023-07-10 DIAGNOSIS — G20B1 Parkinson's disease with dyskinesia, without mention of fluctuations: Secondary | ICD-10-CM | POA: Diagnosis not present

## 2023-07-10 DIAGNOSIS — Z79899 Other long term (current) drug therapy: Secondary | ICD-10-CM | POA: Diagnosis not present

## 2023-07-10 DIAGNOSIS — F0280 Dementia in other diseases classified elsewhere without behavioral disturbance: Secondary | ICD-10-CM | POA: Diagnosis not present

## 2023-07-10 DIAGNOSIS — J309 Allergic rhinitis, unspecified: Secondary | ICD-10-CM | POA: Diagnosis not present

## 2023-07-11 DIAGNOSIS — G20A1 Parkinson's disease without dyskinesia, without mention of fluctuations: Secondary | ICD-10-CM | POA: Diagnosis not present

## 2023-07-11 DIAGNOSIS — I10 Essential (primary) hypertension: Secondary | ICD-10-CM | POA: Diagnosis not present

## 2023-07-11 DIAGNOSIS — R296 Repeated falls: Secondary | ICD-10-CM | POA: Diagnosis not present

## 2023-07-11 DIAGNOSIS — G311 Senile degeneration of brain, not elsewhere classified: Secondary | ICD-10-CM | POA: Diagnosis not present

## 2023-07-11 DIAGNOSIS — R001 Bradycardia, unspecified: Secondary | ICD-10-CM | POA: Diagnosis not present

## 2023-07-11 DIAGNOSIS — F0283 Dementia in other diseases classified elsewhere, unspecified severity, with mood disturbance: Secondary | ICD-10-CM | POA: Diagnosis not present

## 2023-07-12 DIAGNOSIS — R296 Repeated falls: Secondary | ICD-10-CM | POA: Diagnosis not present

## 2023-07-12 DIAGNOSIS — I10 Essential (primary) hypertension: Secondary | ICD-10-CM | POA: Diagnosis not present

## 2023-07-12 DIAGNOSIS — G20A1 Parkinson's disease without dyskinesia, without mention of fluctuations: Secondary | ICD-10-CM | POA: Diagnosis not present

## 2023-07-12 DIAGNOSIS — F0283 Dementia in other diseases classified elsewhere, unspecified severity, with mood disturbance: Secondary | ICD-10-CM | POA: Diagnosis not present

## 2023-07-12 DIAGNOSIS — G311 Senile degeneration of brain, not elsewhere classified: Secondary | ICD-10-CM | POA: Diagnosis not present

## 2023-07-12 DIAGNOSIS — R001 Bradycardia, unspecified: Secondary | ICD-10-CM | POA: Diagnosis not present

## 2023-07-13 DIAGNOSIS — R001 Bradycardia, unspecified: Secondary | ICD-10-CM | POA: Diagnosis not present

## 2023-07-13 DIAGNOSIS — H409 Unspecified glaucoma: Secondary | ICD-10-CM | POA: Diagnosis not present

## 2023-07-13 DIAGNOSIS — L249 Irritant contact dermatitis, unspecified cause: Secondary | ICD-10-CM | POA: Diagnosis not present

## 2023-07-13 DIAGNOSIS — G20A1 Parkinson's disease without dyskinesia, without mention of fluctuations: Secondary | ICD-10-CM | POA: Diagnosis not present

## 2023-07-13 DIAGNOSIS — R32 Unspecified urinary incontinence: Secondary | ICD-10-CM | POA: Diagnosis not present

## 2023-07-13 DIAGNOSIS — E785 Hyperlipidemia, unspecified: Secondary | ICD-10-CM | POA: Diagnosis not present

## 2023-07-13 DIAGNOSIS — R159 Full incontinence of feces: Secondary | ICD-10-CM | POA: Diagnosis not present

## 2023-07-13 DIAGNOSIS — J302 Other seasonal allergic rhinitis: Secondary | ICD-10-CM | POA: Diagnosis not present

## 2023-07-13 DIAGNOSIS — R296 Repeated falls: Secondary | ICD-10-CM | POA: Diagnosis not present

## 2023-07-13 DIAGNOSIS — I10 Essential (primary) hypertension: Secondary | ICD-10-CM | POA: Diagnosis not present

## 2023-07-13 DIAGNOSIS — F0283 Dementia in other diseases classified elsewhere, unspecified severity, with mood disturbance: Secondary | ICD-10-CM | POA: Diagnosis not present

## 2023-07-13 DIAGNOSIS — G311 Senile degeneration of brain, not elsewhere classified: Secondary | ICD-10-CM | POA: Diagnosis not present

## 2023-07-14 DIAGNOSIS — G20A1 Parkinson's disease without dyskinesia, without mention of fluctuations: Secondary | ICD-10-CM | POA: Diagnosis not present

## 2023-07-14 DIAGNOSIS — R296 Repeated falls: Secondary | ICD-10-CM | POA: Diagnosis not present

## 2023-07-14 DIAGNOSIS — I10 Essential (primary) hypertension: Secondary | ICD-10-CM | POA: Diagnosis not present

## 2023-07-14 DIAGNOSIS — R001 Bradycardia, unspecified: Secondary | ICD-10-CM | POA: Diagnosis not present

## 2023-07-14 DIAGNOSIS — F0283 Dementia in other diseases classified elsewhere, unspecified severity, with mood disturbance: Secondary | ICD-10-CM | POA: Diagnosis not present

## 2023-07-14 DIAGNOSIS — G311 Senile degeneration of brain, not elsewhere classified: Secondary | ICD-10-CM | POA: Diagnosis not present

## 2023-07-18 DIAGNOSIS — F0283 Dementia in other diseases classified elsewhere, unspecified severity, with mood disturbance: Secondary | ICD-10-CM | POA: Diagnosis not present

## 2023-07-18 DIAGNOSIS — G311 Senile degeneration of brain, not elsewhere classified: Secondary | ICD-10-CM | POA: Diagnosis not present

## 2023-07-18 DIAGNOSIS — I10 Essential (primary) hypertension: Secondary | ICD-10-CM | POA: Diagnosis not present

## 2023-07-18 DIAGNOSIS — R001 Bradycardia, unspecified: Secondary | ICD-10-CM | POA: Diagnosis not present

## 2023-07-18 DIAGNOSIS — R296 Repeated falls: Secondary | ICD-10-CM | POA: Diagnosis not present

## 2023-07-18 DIAGNOSIS — G20A1 Parkinson's disease without dyskinesia, without mention of fluctuations: Secondary | ICD-10-CM | POA: Diagnosis not present

## 2023-07-19 DIAGNOSIS — G20A1 Parkinson's disease without dyskinesia, without mention of fluctuations: Secondary | ICD-10-CM | POA: Diagnosis not present

## 2023-07-19 DIAGNOSIS — R001 Bradycardia, unspecified: Secondary | ICD-10-CM | POA: Diagnosis not present

## 2023-07-19 DIAGNOSIS — F0283 Dementia in other diseases classified elsewhere, unspecified severity, with mood disturbance: Secondary | ICD-10-CM | POA: Diagnosis not present

## 2023-07-19 DIAGNOSIS — I10 Essential (primary) hypertension: Secondary | ICD-10-CM | POA: Diagnosis not present

## 2023-07-19 DIAGNOSIS — G311 Senile degeneration of brain, not elsewhere classified: Secondary | ICD-10-CM | POA: Diagnosis not present

## 2023-07-19 DIAGNOSIS — R296 Repeated falls: Secondary | ICD-10-CM | POA: Diagnosis not present

## 2023-07-20 DIAGNOSIS — F0283 Dementia in other diseases classified elsewhere, unspecified severity, with mood disturbance: Secondary | ICD-10-CM | POA: Diagnosis not present

## 2023-07-20 DIAGNOSIS — I10 Essential (primary) hypertension: Secondary | ICD-10-CM | POA: Diagnosis not present

## 2023-07-20 DIAGNOSIS — G311 Senile degeneration of brain, not elsewhere classified: Secondary | ICD-10-CM | POA: Diagnosis not present

## 2023-07-20 DIAGNOSIS — G20A1 Parkinson's disease without dyskinesia, without mention of fluctuations: Secondary | ICD-10-CM | POA: Diagnosis not present

## 2023-07-20 DIAGNOSIS — R001 Bradycardia, unspecified: Secondary | ICD-10-CM | POA: Diagnosis not present

## 2023-07-20 DIAGNOSIS — R296 Repeated falls: Secondary | ICD-10-CM | POA: Diagnosis not present

## 2023-07-25 DIAGNOSIS — I10 Essential (primary) hypertension: Secondary | ICD-10-CM | POA: Diagnosis not present

## 2023-07-25 DIAGNOSIS — F331 Major depressive disorder, recurrent, moderate: Secondary | ICD-10-CM | POA: Diagnosis not present

## 2023-07-25 DIAGNOSIS — R443 Hallucinations, unspecified: Secondary | ICD-10-CM | POA: Diagnosis not present

## 2023-07-25 DIAGNOSIS — G20A1 Parkinson's disease without dyskinesia, without mention of fluctuations: Secondary | ICD-10-CM | POA: Diagnosis not present

## 2023-07-25 DIAGNOSIS — F062 Psychotic disorder with delusions due to known physiological condition: Secondary | ICD-10-CM | POA: Diagnosis not present

## 2023-07-25 DIAGNOSIS — G311 Senile degeneration of brain, not elsewhere classified: Secondary | ICD-10-CM | POA: Diagnosis not present

## 2023-07-25 DIAGNOSIS — F0283 Dementia in other diseases classified elsewhere, unspecified severity, with mood disturbance: Secondary | ICD-10-CM | POA: Diagnosis not present

## 2023-07-25 DIAGNOSIS — R296 Repeated falls: Secondary | ICD-10-CM | POA: Diagnosis not present

## 2023-07-25 DIAGNOSIS — F411 Generalized anxiety disorder: Secondary | ICD-10-CM | POA: Diagnosis not present

## 2023-07-25 DIAGNOSIS — R001 Bradycardia, unspecified: Secondary | ICD-10-CM | POA: Diagnosis not present

## 2023-07-26 DIAGNOSIS — G20A1 Parkinson's disease without dyskinesia, without mention of fluctuations: Secondary | ICD-10-CM | POA: Diagnosis not present

## 2023-07-26 DIAGNOSIS — G311 Senile degeneration of brain, not elsewhere classified: Secondary | ICD-10-CM | POA: Diagnosis not present

## 2023-07-26 DIAGNOSIS — F0283 Dementia in other diseases classified elsewhere, unspecified severity, with mood disturbance: Secondary | ICD-10-CM | POA: Diagnosis not present

## 2023-07-26 DIAGNOSIS — I10 Essential (primary) hypertension: Secondary | ICD-10-CM | POA: Diagnosis not present

## 2023-07-26 DIAGNOSIS — R001 Bradycardia, unspecified: Secondary | ICD-10-CM | POA: Diagnosis not present

## 2023-07-26 DIAGNOSIS — R296 Repeated falls: Secondary | ICD-10-CM | POA: Diagnosis not present

## 2023-07-27 DIAGNOSIS — F0283 Dementia in other diseases classified elsewhere, unspecified severity, with mood disturbance: Secondary | ICD-10-CM | POA: Diagnosis not present

## 2023-07-27 DIAGNOSIS — G311 Senile degeneration of brain, not elsewhere classified: Secondary | ICD-10-CM | POA: Diagnosis not present

## 2023-07-27 DIAGNOSIS — R001 Bradycardia, unspecified: Secondary | ICD-10-CM | POA: Diagnosis not present

## 2023-07-27 DIAGNOSIS — R296 Repeated falls: Secondary | ICD-10-CM | POA: Diagnosis not present

## 2023-07-27 DIAGNOSIS — I10 Essential (primary) hypertension: Secondary | ICD-10-CM | POA: Diagnosis not present

## 2023-07-27 DIAGNOSIS — G20A1 Parkinson's disease without dyskinesia, without mention of fluctuations: Secondary | ICD-10-CM | POA: Diagnosis not present

## 2023-08-01 DIAGNOSIS — F0283 Dementia in other diseases classified elsewhere, unspecified severity, with mood disturbance: Secondary | ICD-10-CM | POA: Diagnosis not present

## 2023-08-01 DIAGNOSIS — R296 Repeated falls: Secondary | ICD-10-CM | POA: Diagnosis not present

## 2023-08-01 DIAGNOSIS — G311 Senile degeneration of brain, not elsewhere classified: Secondary | ICD-10-CM | POA: Diagnosis not present

## 2023-08-01 DIAGNOSIS — G20A1 Parkinson's disease without dyskinesia, without mention of fluctuations: Secondary | ICD-10-CM | POA: Diagnosis not present

## 2023-08-01 DIAGNOSIS — R001 Bradycardia, unspecified: Secondary | ICD-10-CM | POA: Diagnosis not present

## 2023-08-01 DIAGNOSIS — I10 Essential (primary) hypertension: Secondary | ICD-10-CM | POA: Diagnosis not present

## 2023-08-02 DIAGNOSIS — I10 Essential (primary) hypertension: Secondary | ICD-10-CM | POA: Diagnosis not present

## 2023-08-02 DIAGNOSIS — R001 Bradycardia, unspecified: Secondary | ICD-10-CM | POA: Diagnosis not present

## 2023-08-02 DIAGNOSIS — G20A1 Parkinson's disease without dyskinesia, without mention of fluctuations: Secondary | ICD-10-CM | POA: Diagnosis not present

## 2023-08-02 DIAGNOSIS — R296 Repeated falls: Secondary | ICD-10-CM | POA: Diagnosis not present

## 2023-08-02 DIAGNOSIS — F0283 Dementia in other diseases classified elsewhere, unspecified severity, with mood disturbance: Secondary | ICD-10-CM | POA: Diagnosis not present

## 2023-08-02 DIAGNOSIS — G311 Senile degeneration of brain, not elsewhere classified: Secondary | ICD-10-CM | POA: Diagnosis not present

## 2023-08-03 DIAGNOSIS — I10 Essential (primary) hypertension: Secondary | ICD-10-CM | POA: Diagnosis not present

## 2023-08-03 DIAGNOSIS — R001 Bradycardia, unspecified: Secondary | ICD-10-CM | POA: Diagnosis not present

## 2023-08-03 DIAGNOSIS — G20A1 Parkinson's disease without dyskinesia, without mention of fluctuations: Secondary | ICD-10-CM | POA: Diagnosis not present

## 2023-08-03 DIAGNOSIS — R296 Repeated falls: Secondary | ICD-10-CM | POA: Diagnosis not present

## 2023-08-03 DIAGNOSIS — G311 Senile degeneration of brain, not elsewhere classified: Secondary | ICD-10-CM | POA: Diagnosis not present

## 2023-08-03 DIAGNOSIS — F0283 Dementia in other diseases classified elsewhere, unspecified severity, with mood disturbance: Secondary | ICD-10-CM | POA: Diagnosis not present

## 2023-08-07 DIAGNOSIS — R296 Repeated falls: Secondary | ICD-10-CM | POA: Diagnosis not present

## 2023-08-07 DIAGNOSIS — I10 Essential (primary) hypertension: Secondary | ICD-10-CM | POA: Diagnosis not present

## 2023-08-07 DIAGNOSIS — F331 Major depressive disorder, recurrent, moderate: Secondary | ICD-10-CM | POA: Diagnosis not present

## 2023-08-07 DIAGNOSIS — G20A1 Parkinson's disease without dyskinesia, without mention of fluctuations: Secondary | ICD-10-CM | POA: Diagnosis not present

## 2023-08-07 DIAGNOSIS — F0280 Dementia in other diseases classified elsewhere without behavioral disturbance: Secondary | ICD-10-CM | POA: Diagnosis not present

## 2023-08-07 DIAGNOSIS — F0283 Dementia in other diseases classified elsewhere, unspecified severity, with mood disturbance: Secondary | ICD-10-CM | POA: Diagnosis not present

## 2023-08-07 DIAGNOSIS — G301 Alzheimer's disease with late onset: Secondary | ICD-10-CM | POA: Diagnosis not present

## 2023-08-07 DIAGNOSIS — R001 Bradycardia, unspecified: Secondary | ICD-10-CM | POA: Diagnosis not present

## 2023-08-07 DIAGNOSIS — G311 Senile degeneration of brain, not elsewhere classified: Secondary | ICD-10-CM | POA: Diagnosis not present

## 2023-08-08 DIAGNOSIS — R001 Bradycardia, unspecified: Secondary | ICD-10-CM | POA: Diagnosis not present

## 2023-08-08 DIAGNOSIS — G301 Alzheimer's disease with late onset: Secondary | ICD-10-CM | POA: Diagnosis not present

## 2023-08-08 DIAGNOSIS — G20A1 Parkinson's disease without dyskinesia, without mention of fluctuations: Secondary | ICD-10-CM | POA: Diagnosis not present

## 2023-08-08 DIAGNOSIS — F0283 Dementia in other diseases classified elsewhere, unspecified severity, with mood disturbance: Secondary | ICD-10-CM | POA: Diagnosis not present

## 2023-08-08 DIAGNOSIS — I10 Essential (primary) hypertension: Secondary | ICD-10-CM | POA: Diagnosis not present

## 2023-08-08 DIAGNOSIS — R296 Repeated falls: Secondary | ICD-10-CM | POA: Diagnosis not present

## 2023-08-08 DIAGNOSIS — G311 Senile degeneration of brain, not elsewhere classified: Secondary | ICD-10-CM | POA: Diagnosis not present

## 2023-08-10 DIAGNOSIS — G311 Senile degeneration of brain, not elsewhere classified: Secondary | ICD-10-CM | POA: Diagnosis not present

## 2023-08-10 DIAGNOSIS — I10 Essential (primary) hypertension: Secondary | ICD-10-CM | POA: Diagnosis not present

## 2023-08-10 DIAGNOSIS — R296 Repeated falls: Secondary | ICD-10-CM | POA: Diagnosis not present

## 2023-08-10 DIAGNOSIS — R001 Bradycardia, unspecified: Secondary | ICD-10-CM | POA: Diagnosis not present

## 2023-08-10 DIAGNOSIS — G20A1 Parkinson's disease without dyskinesia, without mention of fluctuations: Secondary | ICD-10-CM | POA: Diagnosis not present

## 2023-08-10 DIAGNOSIS — F0283 Dementia in other diseases classified elsewhere, unspecified severity, with mood disturbance: Secondary | ICD-10-CM | POA: Diagnosis not present

## 2023-08-13 DIAGNOSIS — J302 Other seasonal allergic rhinitis: Secondary | ICD-10-CM | POA: Diagnosis not present

## 2023-08-13 DIAGNOSIS — E785 Hyperlipidemia, unspecified: Secondary | ICD-10-CM | POA: Diagnosis not present

## 2023-08-13 DIAGNOSIS — R001 Bradycardia, unspecified: Secondary | ICD-10-CM | POA: Diagnosis not present

## 2023-08-13 DIAGNOSIS — Z8616 Personal history of COVID-19: Secondary | ICD-10-CM | POA: Diagnosis not present

## 2023-08-13 DIAGNOSIS — F0283 Dementia in other diseases classified elsewhere, unspecified severity, with mood disturbance: Secondary | ICD-10-CM | POA: Diagnosis not present

## 2023-08-13 DIAGNOSIS — R159 Full incontinence of feces: Secondary | ICD-10-CM | POA: Diagnosis not present

## 2023-08-13 DIAGNOSIS — R296 Repeated falls: Secondary | ICD-10-CM | POA: Diagnosis not present

## 2023-08-13 DIAGNOSIS — H409 Unspecified glaucoma: Secondary | ICD-10-CM | POA: Diagnosis not present

## 2023-08-13 DIAGNOSIS — R32 Unspecified urinary incontinence: Secondary | ICD-10-CM | POA: Diagnosis not present

## 2023-08-13 DIAGNOSIS — G311 Senile degeneration of brain, not elsewhere classified: Secondary | ICD-10-CM | POA: Diagnosis not present

## 2023-08-13 DIAGNOSIS — I10 Essential (primary) hypertension: Secondary | ICD-10-CM | POA: Diagnosis not present

## 2023-08-13 DIAGNOSIS — G20A1 Parkinson's disease without dyskinesia, without mention of fluctuations: Secondary | ICD-10-CM | POA: Diagnosis not present

## 2023-08-14 DIAGNOSIS — G311 Senile degeneration of brain, not elsewhere classified: Secondary | ICD-10-CM | POA: Diagnosis not present

## 2023-08-14 DIAGNOSIS — F0283 Dementia in other diseases classified elsewhere, unspecified severity, with mood disturbance: Secondary | ICD-10-CM | POA: Diagnosis not present

## 2023-08-14 DIAGNOSIS — G20A1 Parkinson's disease without dyskinesia, without mention of fluctuations: Secondary | ICD-10-CM | POA: Diagnosis not present

## 2023-08-14 DIAGNOSIS — I10 Essential (primary) hypertension: Secondary | ICD-10-CM | POA: Diagnosis not present

## 2023-08-14 DIAGNOSIS — Z8616 Personal history of COVID-19: Secondary | ICD-10-CM | POA: Diagnosis not present

## 2023-08-14 DIAGNOSIS — R296 Repeated falls: Secondary | ICD-10-CM | POA: Diagnosis not present

## 2023-08-15 DIAGNOSIS — G311 Senile degeneration of brain, not elsewhere classified: Secondary | ICD-10-CM | POA: Diagnosis not present

## 2023-08-15 DIAGNOSIS — I10 Essential (primary) hypertension: Secondary | ICD-10-CM | POA: Diagnosis not present

## 2023-08-15 DIAGNOSIS — Z8616 Personal history of COVID-19: Secondary | ICD-10-CM | POA: Diagnosis not present

## 2023-08-15 DIAGNOSIS — F0283 Dementia in other diseases classified elsewhere, unspecified severity, with mood disturbance: Secondary | ICD-10-CM | POA: Diagnosis not present

## 2023-08-15 DIAGNOSIS — R296 Repeated falls: Secondary | ICD-10-CM | POA: Diagnosis not present

## 2023-08-15 DIAGNOSIS — G20A1 Parkinson's disease without dyskinesia, without mention of fluctuations: Secondary | ICD-10-CM | POA: Diagnosis not present

## 2023-08-16 DIAGNOSIS — I10 Essential (primary) hypertension: Secondary | ICD-10-CM | POA: Diagnosis not present

## 2023-08-16 DIAGNOSIS — G311 Senile degeneration of brain, not elsewhere classified: Secondary | ICD-10-CM | POA: Diagnosis not present

## 2023-08-16 DIAGNOSIS — Z8616 Personal history of COVID-19: Secondary | ICD-10-CM | POA: Diagnosis not present

## 2023-08-16 DIAGNOSIS — G20A1 Parkinson's disease without dyskinesia, without mention of fluctuations: Secondary | ICD-10-CM | POA: Diagnosis not present

## 2023-08-16 DIAGNOSIS — R296 Repeated falls: Secondary | ICD-10-CM | POA: Diagnosis not present

## 2023-08-16 DIAGNOSIS — F0283 Dementia in other diseases classified elsewhere, unspecified severity, with mood disturbance: Secondary | ICD-10-CM | POA: Diagnosis not present

## 2023-08-17 DIAGNOSIS — F0283 Dementia in other diseases classified elsewhere, unspecified severity, with mood disturbance: Secondary | ICD-10-CM | POA: Diagnosis not present

## 2023-08-17 DIAGNOSIS — G311 Senile degeneration of brain, not elsewhere classified: Secondary | ICD-10-CM | POA: Diagnosis not present

## 2023-08-17 DIAGNOSIS — R296 Repeated falls: Secondary | ICD-10-CM | POA: Diagnosis not present

## 2023-08-17 DIAGNOSIS — B351 Tinea unguium: Secondary | ICD-10-CM | POA: Diagnosis not present

## 2023-08-17 DIAGNOSIS — I10 Essential (primary) hypertension: Secondary | ICD-10-CM | POA: Diagnosis not present

## 2023-08-17 DIAGNOSIS — G20A1 Parkinson's disease without dyskinesia, without mention of fluctuations: Secondary | ICD-10-CM | POA: Diagnosis not present

## 2023-08-17 DIAGNOSIS — Z8616 Personal history of COVID-19: Secondary | ICD-10-CM | POA: Diagnosis not present

## 2023-08-17 DIAGNOSIS — M2041 Other hammer toe(s) (acquired), right foot: Secondary | ICD-10-CM | POA: Diagnosis not present

## 2023-08-18 DIAGNOSIS — R296 Repeated falls: Secondary | ICD-10-CM | POA: Diagnosis not present

## 2023-08-18 DIAGNOSIS — F0283 Dementia in other diseases classified elsewhere, unspecified severity, with mood disturbance: Secondary | ICD-10-CM | POA: Diagnosis not present

## 2023-08-18 DIAGNOSIS — G311 Senile degeneration of brain, not elsewhere classified: Secondary | ICD-10-CM | POA: Diagnosis not present

## 2023-08-18 DIAGNOSIS — I10 Essential (primary) hypertension: Secondary | ICD-10-CM | POA: Diagnosis not present

## 2023-08-18 DIAGNOSIS — F062 Psychotic disorder with delusions due to known physiological condition: Secondary | ICD-10-CM | POA: Diagnosis not present

## 2023-08-18 DIAGNOSIS — R443 Hallucinations, unspecified: Secondary | ICD-10-CM | POA: Diagnosis not present

## 2023-08-18 DIAGNOSIS — F411 Generalized anxiety disorder: Secondary | ICD-10-CM | POA: Diagnosis not present

## 2023-08-18 DIAGNOSIS — F331 Major depressive disorder, recurrent, moderate: Secondary | ICD-10-CM | POA: Diagnosis not present

## 2023-08-18 DIAGNOSIS — G20A1 Parkinson's disease without dyskinesia, without mention of fluctuations: Secondary | ICD-10-CM | POA: Diagnosis not present

## 2023-08-18 DIAGNOSIS — Z8616 Personal history of COVID-19: Secondary | ICD-10-CM | POA: Diagnosis not present

## 2023-08-19 DIAGNOSIS — R296 Repeated falls: Secondary | ICD-10-CM | POA: Diagnosis not present

## 2023-08-19 DIAGNOSIS — G20A1 Parkinson's disease without dyskinesia, without mention of fluctuations: Secondary | ICD-10-CM | POA: Diagnosis not present

## 2023-08-19 DIAGNOSIS — I10 Essential (primary) hypertension: Secondary | ICD-10-CM | POA: Diagnosis not present

## 2023-08-19 DIAGNOSIS — G311 Senile degeneration of brain, not elsewhere classified: Secondary | ICD-10-CM | POA: Diagnosis not present

## 2023-08-19 DIAGNOSIS — Z8616 Personal history of COVID-19: Secondary | ICD-10-CM | POA: Diagnosis not present

## 2023-08-19 DIAGNOSIS — F0283 Dementia in other diseases classified elsewhere, unspecified severity, with mood disturbance: Secondary | ICD-10-CM | POA: Diagnosis not present

## 2023-08-20 DIAGNOSIS — G311 Senile degeneration of brain, not elsewhere classified: Secondary | ICD-10-CM | POA: Diagnosis not present

## 2023-08-20 DIAGNOSIS — I10 Essential (primary) hypertension: Secondary | ICD-10-CM | POA: Diagnosis not present

## 2023-08-20 DIAGNOSIS — Z8616 Personal history of COVID-19: Secondary | ICD-10-CM | POA: Diagnosis not present

## 2023-08-20 DIAGNOSIS — R296 Repeated falls: Secondary | ICD-10-CM | POA: Diagnosis not present

## 2023-08-20 DIAGNOSIS — F0283 Dementia in other diseases classified elsewhere, unspecified severity, with mood disturbance: Secondary | ICD-10-CM | POA: Diagnosis not present

## 2023-08-20 DIAGNOSIS — G20A1 Parkinson's disease without dyskinesia, without mention of fluctuations: Secondary | ICD-10-CM | POA: Diagnosis not present

## 2023-08-21 DIAGNOSIS — G311 Senile degeneration of brain, not elsewhere classified: Secondary | ICD-10-CM | POA: Diagnosis not present

## 2023-08-21 DIAGNOSIS — Z8616 Personal history of COVID-19: Secondary | ICD-10-CM | POA: Diagnosis not present

## 2023-08-21 DIAGNOSIS — F0283 Dementia in other diseases classified elsewhere, unspecified severity, with mood disturbance: Secondary | ICD-10-CM | POA: Diagnosis not present

## 2023-08-21 DIAGNOSIS — I10 Essential (primary) hypertension: Secondary | ICD-10-CM | POA: Diagnosis not present

## 2023-08-21 DIAGNOSIS — R296 Repeated falls: Secondary | ICD-10-CM | POA: Diagnosis not present

## 2023-08-21 DIAGNOSIS — G20A1 Parkinson's disease without dyskinesia, without mention of fluctuations: Secondary | ICD-10-CM | POA: Diagnosis not present

## 2023-08-22 DIAGNOSIS — Z8616 Personal history of COVID-19: Secondary | ICD-10-CM | POA: Diagnosis not present

## 2023-08-22 DIAGNOSIS — G311 Senile degeneration of brain, not elsewhere classified: Secondary | ICD-10-CM | POA: Diagnosis not present

## 2023-08-22 DIAGNOSIS — G20A1 Parkinson's disease without dyskinesia, without mention of fluctuations: Secondary | ICD-10-CM | POA: Diagnosis not present

## 2023-08-22 DIAGNOSIS — F0283 Dementia in other diseases classified elsewhere, unspecified severity, with mood disturbance: Secondary | ICD-10-CM | POA: Diagnosis not present

## 2023-08-22 DIAGNOSIS — I10 Essential (primary) hypertension: Secondary | ICD-10-CM | POA: Diagnosis not present

## 2023-08-22 DIAGNOSIS — R296 Repeated falls: Secondary | ICD-10-CM | POA: Diagnosis not present

## 2023-08-23 DIAGNOSIS — G311 Senile degeneration of brain, not elsewhere classified: Secondary | ICD-10-CM | POA: Diagnosis not present

## 2023-08-23 DIAGNOSIS — I10 Essential (primary) hypertension: Secondary | ICD-10-CM | POA: Diagnosis not present

## 2023-08-23 DIAGNOSIS — G20A1 Parkinson's disease without dyskinesia, without mention of fluctuations: Secondary | ICD-10-CM | POA: Diagnosis not present

## 2023-08-23 DIAGNOSIS — F0283 Dementia in other diseases classified elsewhere, unspecified severity, with mood disturbance: Secondary | ICD-10-CM | POA: Diagnosis not present

## 2023-08-23 DIAGNOSIS — Z8616 Personal history of COVID-19: Secondary | ICD-10-CM | POA: Diagnosis not present

## 2023-08-23 DIAGNOSIS — R296 Repeated falls: Secondary | ICD-10-CM | POA: Diagnosis not present

## 2023-08-24 DIAGNOSIS — Z8616 Personal history of COVID-19: Secondary | ICD-10-CM | POA: Diagnosis not present

## 2023-08-24 DIAGNOSIS — F0283 Dementia in other diseases classified elsewhere, unspecified severity, with mood disturbance: Secondary | ICD-10-CM | POA: Diagnosis not present

## 2023-08-24 DIAGNOSIS — R296 Repeated falls: Secondary | ICD-10-CM | POA: Diagnosis not present

## 2023-08-24 DIAGNOSIS — I10 Essential (primary) hypertension: Secondary | ICD-10-CM | POA: Diagnosis not present

## 2023-08-24 DIAGNOSIS — G20A1 Parkinson's disease without dyskinesia, without mention of fluctuations: Secondary | ICD-10-CM | POA: Diagnosis not present

## 2023-08-24 DIAGNOSIS — G311 Senile degeneration of brain, not elsewhere classified: Secondary | ICD-10-CM | POA: Diagnosis not present

## 2023-08-25 DIAGNOSIS — G20A1 Parkinson's disease without dyskinesia, without mention of fluctuations: Secondary | ICD-10-CM | POA: Diagnosis not present

## 2023-08-25 DIAGNOSIS — F331 Major depressive disorder, recurrent, moderate: Secondary | ICD-10-CM | POA: Diagnosis not present

## 2023-08-25 DIAGNOSIS — F0283 Dementia in other diseases classified elsewhere, unspecified severity, with mood disturbance: Secondary | ICD-10-CM | POA: Diagnosis not present

## 2023-08-25 DIAGNOSIS — Z8616 Personal history of COVID-19: Secondary | ICD-10-CM | POA: Diagnosis not present

## 2023-08-25 DIAGNOSIS — G311 Senile degeneration of brain, not elsewhere classified: Secondary | ICD-10-CM | POA: Diagnosis not present

## 2023-08-25 DIAGNOSIS — R443 Hallucinations, unspecified: Secondary | ICD-10-CM | POA: Diagnosis not present

## 2023-08-25 DIAGNOSIS — R296 Repeated falls: Secondary | ICD-10-CM | POA: Diagnosis not present

## 2023-08-25 DIAGNOSIS — G309 Alzheimer's disease, unspecified: Secondary | ICD-10-CM | POA: Diagnosis not present

## 2023-08-25 DIAGNOSIS — F062 Psychotic disorder with delusions due to known physiological condition: Secondary | ICD-10-CM | POA: Diagnosis not present

## 2023-08-25 DIAGNOSIS — I10 Essential (primary) hypertension: Secondary | ICD-10-CM | POA: Diagnosis not present

## 2023-08-25 DIAGNOSIS — F411 Generalized anxiety disorder: Secondary | ICD-10-CM | POA: Diagnosis not present

## 2023-08-26 DIAGNOSIS — G20A1 Parkinson's disease without dyskinesia, without mention of fluctuations: Secondary | ICD-10-CM | POA: Diagnosis not present

## 2023-08-26 DIAGNOSIS — I10 Essential (primary) hypertension: Secondary | ICD-10-CM | POA: Diagnosis not present

## 2023-08-26 DIAGNOSIS — F0283 Dementia in other diseases classified elsewhere, unspecified severity, with mood disturbance: Secondary | ICD-10-CM | POA: Diagnosis not present

## 2023-08-26 DIAGNOSIS — R296 Repeated falls: Secondary | ICD-10-CM | POA: Diagnosis not present

## 2023-08-26 DIAGNOSIS — Z8616 Personal history of COVID-19: Secondary | ICD-10-CM | POA: Diagnosis not present

## 2023-08-26 DIAGNOSIS — G311 Senile degeneration of brain, not elsewhere classified: Secondary | ICD-10-CM | POA: Diagnosis not present

## 2023-08-27 DIAGNOSIS — I10 Essential (primary) hypertension: Secondary | ICD-10-CM | POA: Diagnosis not present

## 2023-08-27 DIAGNOSIS — G20A1 Parkinson's disease without dyskinesia, without mention of fluctuations: Secondary | ICD-10-CM | POA: Diagnosis not present

## 2023-08-27 DIAGNOSIS — R296 Repeated falls: Secondary | ICD-10-CM | POA: Diagnosis not present

## 2023-08-27 DIAGNOSIS — Z8616 Personal history of COVID-19: Secondary | ICD-10-CM | POA: Diagnosis not present

## 2023-08-27 DIAGNOSIS — G311 Senile degeneration of brain, not elsewhere classified: Secondary | ICD-10-CM | POA: Diagnosis not present

## 2023-08-27 DIAGNOSIS — F0283 Dementia in other diseases classified elsewhere, unspecified severity, with mood disturbance: Secondary | ICD-10-CM | POA: Diagnosis not present

## 2023-08-28 DIAGNOSIS — R296 Repeated falls: Secondary | ICD-10-CM | POA: Diagnosis not present

## 2023-08-28 DIAGNOSIS — F0283 Dementia in other diseases classified elsewhere, unspecified severity, with mood disturbance: Secondary | ICD-10-CM | POA: Diagnosis not present

## 2023-08-28 DIAGNOSIS — G20A1 Parkinson's disease without dyskinesia, without mention of fluctuations: Secondary | ICD-10-CM | POA: Diagnosis not present

## 2023-08-28 DIAGNOSIS — I10 Essential (primary) hypertension: Secondary | ICD-10-CM | POA: Diagnosis not present

## 2023-08-28 DIAGNOSIS — Z8616 Personal history of COVID-19: Secondary | ICD-10-CM | POA: Diagnosis not present

## 2023-08-28 DIAGNOSIS — G311 Senile degeneration of brain, not elsewhere classified: Secondary | ICD-10-CM | POA: Diagnosis not present

## 2023-08-29 DIAGNOSIS — G311 Senile degeneration of brain, not elsewhere classified: Secondary | ICD-10-CM | POA: Diagnosis not present

## 2023-08-29 DIAGNOSIS — F0283 Dementia in other diseases classified elsewhere, unspecified severity, with mood disturbance: Secondary | ICD-10-CM | POA: Diagnosis not present

## 2023-08-29 DIAGNOSIS — Z8616 Personal history of COVID-19: Secondary | ICD-10-CM | POA: Diagnosis not present

## 2023-08-29 DIAGNOSIS — R296 Repeated falls: Secondary | ICD-10-CM | POA: Diagnosis not present

## 2023-08-29 DIAGNOSIS — G20A1 Parkinson's disease without dyskinesia, without mention of fluctuations: Secondary | ICD-10-CM | POA: Diagnosis not present

## 2023-08-29 DIAGNOSIS — I10 Essential (primary) hypertension: Secondary | ICD-10-CM | POA: Diagnosis not present

## 2023-08-30 DIAGNOSIS — R296 Repeated falls: Secondary | ICD-10-CM | POA: Diagnosis not present

## 2023-08-30 DIAGNOSIS — G20A1 Parkinson's disease without dyskinesia, without mention of fluctuations: Secondary | ICD-10-CM | POA: Diagnosis not present

## 2023-08-30 DIAGNOSIS — Z8616 Personal history of COVID-19: Secondary | ICD-10-CM | POA: Diagnosis not present

## 2023-08-30 DIAGNOSIS — I10 Essential (primary) hypertension: Secondary | ICD-10-CM | POA: Diagnosis not present

## 2023-08-30 DIAGNOSIS — G311 Senile degeneration of brain, not elsewhere classified: Secondary | ICD-10-CM | POA: Diagnosis not present

## 2023-08-30 DIAGNOSIS — F0283 Dementia in other diseases classified elsewhere, unspecified severity, with mood disturbance: Secondary | ICD-10-CM | POA: Diagnosis not present

## 2023-08-31 DIAGNOSIS — G311 Senile degeneration of brain, not elsewhere classified: Secondary | ICD-10-CM | POA: Diagnosis not present

## 2023-08-31 DIAGNOSIS — R296 Repeated falls: Secondary | ICD-10-CM | POA: Diagnosis not present

## 2023-08-31 DIAGNOSIS — I10 Essential (primary) hypertension: Secondary | ICD-10-CM | POA: Diagnosis not present

## 2023-08-31 DIAGNOSIS — F0283 Dementia in other diseases classified elsewhere, unspecified severity, with mood disturbance: Secondary | ICD-10-CM | POA: Diagnosis not present

## 2023-08-31 DIAGNOSIS — G20A1 Parkinson's disease without dyskinesia, without mention of fluctuations: Secondary | ICD-10-CM | POA: Diagnosis not present

## 2023-08-31 DIAGNOSIS — Z8616 Personal history of COVID-19: Secondary | ICD-10-CM | POA: Diagnosis not present

## 2023-09-01 DIAGNOSIS — R443 Hallucinations, unspecified: Secondary | ICD-10-CM | POA: Diagnosis not present

## 2023-09-01 DIAGNOSIS — F0283 Dementia in other diseases classified elsewhere, unspecified severity, with mood disturbance: Secondary | ICD-10-CM | POA: Diagnosis not present

## 2023-09-01 DIAGNOSIS — G20A1 Parkinson's disease without dyskinesia, without mention of fluctuations: Secondary | ICD-10-CM | POA: Diagnosis not present

## 2023-09-01 DIAGNOSIS — F062 Psychotic disorder with delusions due to known physiological condition: Secondary | ICD-10-CM | POA: Diagnosis not present

## 2023-09-01 DIAGNOSIS — G309 Alzheimer's disease, unspecified: Secondary | ICD-10-CM | POA: Diagnosis not present

## 2023-09-01 DIAGNOSIS — I10 Essential (primary) hypertension: Secondary | ICD-10-CM | POA: Diagnosis not present

## 2023-09-01 DIAGNOSIS — Z8616 Personal history of COVID-19: Secondary | ICD-10-CM | POA: Diagnosis not present

## 2023-09-01 DIAGNOSIS — F331 Major depressive disorder, recurrent, moderate: Secondary | ICD-10-CM | POA: Diagnosis not present

## 2023-09-01 DIAGNOSIS — F411 Generalized anxiety disorder: Secondary | ICD-10-CM | POA: Diagnosis not present

## 2023-09-01 DIAGNOSIS — F5101 Primary insomnia: Secondary | ICD-10-CM | POA: Diagnosis not present

## 2023-09-01 DIAGNOSIS — G311 Senile degeneration of brain, not elsewhere classified: Secondary | ICD-10-CM | POA: Diagnosis not present

## 2023-09-01 DIAGNOSIS — R296 Repeated falls: Secondary | ICD-10-CM | POA: Diagnosis not present

## 2023-09-02 DIAGNOSIS — F0283 Dementia in other diseases classified elsewhere, unspecified severity, with mood disturbance: Secondary | ICD-10-CM | POA: Diagnosis not present

## 2023-09-02 DIAGNOSIS — I10 Essential (primary) hypertension: Secondary | ICD-10-CM | POA: Diagnosis not present

## 2023-09-02 DIAGNOSIS — G311 Senile degeneration of brain, not elsewhere classified: Secondary | ICD-10-CM | POA: Diagnosis not present

## 2023-09-02 DIAGNOSIS — Z8616 Personal history of COVID-19: Secondary | ICD-10-CM | POA: Diagnosis not present

## 2023-09-02 DIAGNOSIS — R296 Repeated falls: Secondary | ICD-10-CM | POA: Diagnosis not present

## 2023-09-02 DIAGNOSIS — G20A1 Parkinson's disease without dyskinesia, without mention of fluctuations: Secondary | ICD-10-CM | POA: Diagnosis not present

## 2023-09-03 DIAGNOSIS — G311 Senile degeneration of brain, not elsewhere classified: Secondary | ICD-10-CM | POA: Diagnosis not present

## 2023-09-03 DIAGNOSIS — G20A1 Parkinson's disease without dyskinesia, without mention of fluctuations: Secondary | ICD-10-CM | POA: Diagnosis not present

## 2023-09-03 DIAGNOSIS — R296 Repeated falls: Secondary | ICD-10-CM | POA: Diagnosis not present

## 2023-09-03 DIAGNOSIS — F0283 Dementia in other diseases classified elsewhere, unspecified severity, with mood disturbance: Secondary | ICD-10-CM | POA: Diagnosis not present

## 2023-09-03 DIAGNOSIS — Z8616 Personal history of COVID-19: Secondary | ICD-10-CM | POA: Diagnosis not present

## 2023-09-03 DIAGNOSIS — I10 Essential (primary) hypertension: Secondary | ICD-10-CM | POA: Diagnosis not present

## 2023-09-04 DIAGNOSIS — F0283 Dementia in other diseases classified elsewhere, unspecified severity, with mood disturbance: Secondary | ICD-10-CM | POA: Diagnosis not present

## 2023-09-04 DIAGNOSIS — I10 Essential (primary) hypertension: Secondary | ICD-10-CM | POA: Diagnosis not present

## 2023-09-04 DIAGNOSIS — Z8616 Personal history of COVID-19: Secondary | ICD-10-CM | POA: Diagnosis not present

## 2023-09-04 DIAGNOSIS — G311 Senile degeneration of brain, not elsewhere classified: Secondary | ICD-10-CM | POA: Diagnosis not present

## 2023-09-04 DIAGNOSIS — G301 Alzheimer's disease with late onset: Secondary | ICD-10-CM | POA: Diagnosis not present

## 2023-09-04 DIAGNOSIS — R296 Repeated falls: Secondary | ICD-10-CM | POA: Diagnosis not present

## 2023-09-04 DIAGNOSIS — F331 Major depressive disorder, recurrent, moderate: Secondary | ICD-10-CM | POA: Diagnosis not present

## 2023-09-04 DIAGNOSIS — F5104 Psychophysiologic insomnia: Secondary | ICD-10-CM | POA: Diagnosis not present

## 2023-09-04 DIAGNOSIS — G20A1 Parkinson's disease without dyskinesia, without mention of fluctuations: Secondary | ICD-10-CM | POA: Diagnosis not present

## 2023-09-05 DIAGNOSIS — F0283 Dementia in other diseases classified elsewhere, unspecified severity, with mood disturbance: Secondary | ICD-10-CM | POA: Diagnosis not present

## 2023-09-05 DIAGNOSIS — G20A1 Parkinson's disease without dyskinesia, without mention of fluctuations: Secondary | ICD-10-CM | POA: Diagnosis not present

## 2023-09-05 DIAGNOSIS — G311 Senile degeneration of brain, not elsewhere classified: Secondary | ICD-10-CM | POA: Diagnosis not present

## 2023-09-05 DIAGNOSIS — Z8616 Personal history of COVID-19: Secondary | ICD-10-CM | POA: Diagnosis not present

## 2023-09-05 DIAGNOSIS — R296 Repeated falls: Secondary | ICD-10-CM | POA: Diagnosis not present

## 2023-09-05 DIAGNOSIS — I10 Essential (primary) hypertension: Secondary | ICD-10-CM | POA: Diagnosis not present

## 2023-09-06 DIAGNOSIS — F0283 Dementia in other diseases classified elsewhere, unspecified severity, with mood disturbance: Secondary | ICD-10-CM | POA: Diagnosis not present

## 2023-09-06 DIAGNOSIS — I10 Essential (primary) hypertension: Secondary | ICD-10-CM | POA: Diagnosis not present

## 2023-09-06 DIAGNOSIS — G311 Senile degeneration of brain, not elsewhere classified: Secondary | ICD-10-CM | POA: Diagnosis not present

## 2023-09-06 DIAGNOSIS — G20A1 Parkinson's disease without dyskinesia, without mention of fluctuations: Secondary | ICD-10-CM | POA: Diagnosis not present

## 2023-09-06 DIAGNOSIS — R296 Repeated falls: Secondary | ICD-10-CM | POA: Diagnosis not present

## 2023-09-06 DIAGNOSIS — Z8616 Personal history of COVID-19: Secondary | ICD-10-CM | POA: Diagnosis not present

## 2023-09-07 DIAGNOSIS — R296 Repeated falls: Secondary | ICD-10-CM | POA: Diagnosis not present

## 2023-09-07 DIAGNOSIS — Z23 Encounter for immunization: Secondary | ICD-10-CM | POA: Diagnosis not present

## 2023-09-07 DIAGNOSIS — G20A1 Parkinson's disease without dyskinesia, without mention of fluctuations: Secondary | ICD-10-CM | POA: Diagnosis not present

## 2023-09-07 DIAGNOSIS — I10 Essential (primary) hypertension: Secondary | ICD-10-CM | POA: Diagnosis not present

## 2023-09-07 DIAGNOSIS — G311 Senile degeneration of brain, not elsewhere classified: Secondary | ICD-10-CM | POA: Diagnosis not present

## 2023-09-07 DIAGNOSIS — F0283 Dementia in other diseases classified elsewhere, unspecified severity, with mood disturbance: Secondary | ICD-10-CM | POA: Diagnosis not present

## 2023-09-07 DIAGNOSIS — Z8616 Personal history of COVID-19: Secondary | ICD-10-CM | POA: Diagnosis not present

## 2023-09-08 DIAGNOSIS — F0283 Dementia in other diseases classified elsewhere, unspecified severity, with mood disturbance: Secondary | ICD-10-CM | POA: Diagnosis not present

## 2023-09-08 DIAGNOSIS — G20A1 Parkinson's disease without dyskinesia, without mention of fluctuations: Secondary | ICD-10-CM | POA: Diagnosis not present

## 2023-09-08 DIAGNOSIS — R296 Repeated falls: Secondary | ICD-10-CM | POA: Diagnosis not present

## 2023-09-08 DIAGNOSIS — G311 Senile degeneration of brain, not elsewhere classified: Secondary | ICD-10-CM | POA: Diagnosis not present

## 2023-09-08 DIAGNOSIS — I10 Essential (primary) hypertension: Secondary | ICD-10-CM | POA: Diagnosis not present

## 2023-09-08 DIAGNOSIS — Z8616 Personal history of COVID-19: Secondary | ICD-10-CM | POA: Diagnosis not present

## 2023-09-09 DIAGNOSIS — R296 Repeated falls: Secondary | ICD-10-CM | POA: Diagnosis not present

## 2023-09-09 DIAGNOSIS — G311 Senile degeneration of brain, not elsewhere classified: Secondary | ICD-10-CM | POA: Diagnosis not present

## 2023-09-09 DIAGNOSIS — G20A1 Parkinson's disease without dyskinesia, without mention of fluctuations: Secondary | ICD-10-CM | POA: Diagnosis not present

## 2023-09-09 DIAGNOSIS — Z8616 Personal history of COVID-19: Secondary | ICD-10-CM | POA: Diagnosis not present

## 2023-09-09 DIAGNOSIS — F0283 Dementia in other diseases classified elsewhere, unspecified severity, with mood disturbance: Secondary | ICD-10-CM | POA: Diagnosis not present

## 2023-09-09 DIAGNOSIS — I10 Essential (primary) hypertension: Secondary | ICD-10-CM | POA: Diagnosis not present

## 2023-09-10 DIAGNOSIS — R296 Repeated falls: Secondary | ICD-10-CM | POA: Diagnosis not present

## 2023-09-10 DIAGNOSIS — G311 Senile degeneration of brain, not elsewhere classified: Secondary | ICD-10-CM | POA: Diagnosis not present

## 2023-09-10 DIAGNOSIS — I10 Essential (primary) hypertension: Secondary | ICD-10-CM | POA: Diagnosis not present

## 2023-09-10 DIAGNOSIS — G20A1 Parkinson's disease without dyskinesia, without mention of fluctuations: Secondary | ICD-10-CM | POA: Diagnosis not present

## 2023-09-10 DIAGNOSIS — Z8616 Personal history of COVID-19: Secondary | ICD-10-CM | POA: Diagnosis not present

## 2023-09-10 DIAGNOSIS — F0283 Dementia in other diseases classified elsewhere, unspecified severity, with mood disturbance: Secondary | ICD-10-CM | POA: Diagnosis not present

## 2023-09-11 DIAGNOSIS — F0283 Dementia in other diseases classified elsewhere, unspecified severity, with mood disturbance: Secondary | ICD-10-CM | POA: Diagnosis not present

## 2023-09-11 DIAGNOSIS — I10 Essential (primary) hypertension: Secondary | ICD-10-CM | POA: Diagnosis not present

## 2023-09-11 DIAGNOSIS — Z8616 Personal history of COVID-19: Secondary | ICD-10-CM | POA: Diagnosis not present

## 2023-09-11 DIAGNOSIS — G20A1 Parkinson's disease without dyskinesia, without mention of fluctuations: Secondary | ICD-10-CM | POA: Diagnosis not present

## 2023-09-11 DIAGNOSIS — R296 Repeated falls: Secondary | ICD-10-CM | POA: Diagnosis not present

## 2023-09-11 DIAGNOSIS — G311 Senile degeneration of brain, not elsewhere classified: Secondary | ICD-10-CM | POA: Diagnosis not present

## 2023-09-12 DIAGNOSIS — R001 Bradycardia, unspecified: Secondary | ICD-10-CM | POA: Diagnosis not present

## 2023-09-12 DIAGNOSIS — Z8616 Personal history of COVID-19: Secondary | ICD-10-CM | POA: Diagnosis not present

## 2023-09-12 DIAGNOSIS — G311 Senile degeneration of brain, not elsewhere classified: Secondary | ICD-10-CM | POA: Diagnosis not present

## 2023-09-12 DIAGNOSIS — E785 Hyperlipidemia, unspecified: Secondary | ICD-10-CM | POA: Diagnosis not present

## 2023-09-12 DIAGNOSIS — J302 Other seasonal allergic rhinitis: Secondary | ICD-10-CM | POA: Diagnosis not present

## 2023-09-12 DIAGNOSIS — I10 Essential (primary) hypertension: Secondary | ICD-10-CM | POA: Diagnosis not present

## 2023-09-12 DIAGNOSIS — F0283 Dementia in other diseases classified elsewhere, unspecified severity, with mood disturbance: Secondary | ICD-10-CM | POA: Diagnosis not present

## 2023-09-12 DIAGNOSIS — R32 Unspecified urinary incontinence: Secondary | ICD-10-CM | POA: Diagnosis not present

## 2023-09-12 DIAGNOSIS — H409 Unspecified glaucoma: Secondary | ICD-10-CM | POA: Diagnosis not present

## 2023-09-12 DIAGNOSIS — G20A1 Parkinson's disease without dyskinesia, without mention of fluctuations: Secondary | ICD-10-CM | POA: Diagnosis not present

## 2023-09-12 DIAGNOSIS — R296 Repeated falls: Secondary | ICD-10-CM | POA: Diagnosis not present

## 2023-09-12 DIAGNOSIS — R159 Full incontinence of feces: Secondary | ICD-10-CM | POA: Diagnosis not present

## 2023-09-13 DIAGNOSIS — G20A1 Parkinson's disease without dyskinesia, without mention of fluctuations: Secondary | ICD-10-CM | POA: Diagnosis not present

## 2023-09-13 DIAGNOSIS — I10 Essential (primary) hypertension: Secondary | ICD-10-CM | POA: Diagnosis not present

## 2023-09-13 DIAGNOSIS — R296 Repeated falls: Secondary | ICD-10-CM | POA: Diagnosis not present

## 2023-09-13 DIAGNOSIS — Z8616 Personal history of COVID-19: Secondary | ICD-10-CM | POA: Diagnosis not present

## 2023-09-13 DIAGNOSIS — G311 Senile degeneration of brain, not elsewhere classified: Secondary | ICD-10-CM | POA: Diagnosis not present

## 2023-09-13 DIAGNOSIS — F0283 Dementia in other diseases classified elsewhere, unspecified severity, with mood disturbance: Secondary | ICD-10-CM | POA: Diagnosis not present

## 2023-09-14 DIAGNOSIS — G20A1 Parkinson's disease without dyskinesia, without mention of fluctuations: Secondary | ICD-10-CM | POA: Diagnosis not present

## 2023-09-14 DIAGNOSIS — F0283 Dementia in other diseases classified elsewhere, unspecified severity, with mood disturbance: Secondary | ICD-10-CM | POA: Diagnosis not present

## 2023-09-14 DIAGNOSIS — G311 Senile degeneration of brain, not elsewhere classified: Secondary | ICD-10-CM | POA: Diagnosis not present

## 2023-09-14 DIAGNOSIS — R296 Repeated falls: Secondary | ICD-10-CM | POA: Diagnosis not present

## 2023-09-14 DIAGNOSIS — Z8616 Personal history of COVID-19: Secondary | ICD-10-CM | POA: Diagnosis not present

## 2023-09-14 DIAGNOSIS — I10 Essential (primary) hypertension: Secondary | ICD-10-CM | POA: Diagnosis not present

## 2023-09-15 DIAGNOSIS — F0283 Dementia in other diseases classified elsewhere, unspecified severity, with mood disturbance: Secondary | ICD-10-CM | POA: Diagnosis not present

## 2023-09-15 DIAGNOSIS — G20A1 Parkinson's disease without dyskinesia, without mention of fluctuations: Secondary | ICD-10-CM | POA: Diagnosis not present

## 2023-09-15 DIAGNOSIS — R296 Repeated falls: Secondary | ICD-10-CM | POA: Diagnosis not present

## 2023-09-15 DIAGNOSIS — Z8616 Personal history of COVID-19: Secondary | ICD-10-CM | POA: Diagnosis not present

## 2023-09-15 DIAGNOSIS — I10 Essential (primary) hypertension: Secondary | ICD-10-CM | POA: Diagnosis not present

## 2023-09-15 DIAGNOSIS — G311 Senile degeneration of brain, not elsewhere classified: Secondary | ICD-10-CM | POA: Diagnosis not present

## 2023-09-16 DIAGNOSIS — Z8616 Personal history of COVID-19: Secondary | ICD-10-CM | POA: Diagnosis not present

## 2023-09-16 DIAGNOSIS — G311 Senile degeneration of brain, not elsewhere classified: Secondary | ICD-10-CM | POA: Diagnosis not present

## 2023-09-16 DIAGNOSIS — I10 Essential (primary) hypertension: Secondary | ICD-10-CM | POA: Diagnosis not present

## 2023-09-16 DIAGNOSIS — G20A1 Parkinson's disease without dyskinesia, without mention of fluctuations: Secondary | ICD-10-CM | POA: Diagnosis not present

## 2023-09-16 DIAGNOSIS — F0283 Dementia in other diseases classified elsewhere, unspecified severity, with mood disturbance: Secondary | ICD-10-CM | POA: Diagnosis not present

## 2023-09-16 DIAGNOSIS — R296 Repeated falls: Secondary | ICD-10-CM | POA: Diagnosis not present

## 2023-09-17 DIAGNOSIS — I10 Essential (primary) hypertension: Secondary | ICD-10-CM | POA: Diagnosis not present

## 2023-09-17 DIAGNOSIS — G311 Senile degeneration of brain, not elsewhere classified: Secondary | ICD-10-CM | POA: Diagnosis not present

## 2023-09-17 DIAGNOSIS — Z8616 Personal history of COVID-19: Secondary | ICD-10-CM | POA: Diagnosis not present

## 2023-09-17 DIAGNOSIS — G20A1 Parkinson's disease without dyskinesia, without mention of fluctuations: Secondary | ICD-10-CM | POA: Diagnosis not present

## 2023-09-17 DIAGNOSIS — F0283 Dementia in other diseases classified elsewhere, unspecified severity, with mood disturbance: Secondary | ICD-10-CM | POA: Diagnosis not present

## 2023-09-17 DIAGNOSIS — R296 Repeated falls: Secondary | ICD-10-CM | POA: Diagnosis not present

## 2023-09-18 DIAGNOSIS — G311 Senile degeneration of brain, not elsewhere classified: Secondary | ICD-10-CM | POA: Diagnosis not present

## 2023-09-18 DIAGNOSIS — R296 Repeated falls: Secondary | ICD-10-CM | POA: Diagnosis not present

## 2023-09-18 DIAGNOSIS — F0283 Dementia in other diseases classified elsewhere, unspecified severity, with mood disturbance: Secondary | ICD-10-CM | POA: Diagnosis not present

## 2023-09-18 DIAGNOSIS — G20A1 Parkinson's disease without dyskinesia, without mention of fluctuations: Secondary | ICD-10-CM | POA: Diagnosis not present

## 2023-09-18 DIAGNOSIS — I10 Essential (primary) hypertension: Secondary | ICD-10-CM | POA: Diagnosis not present

## 2023-09-18 DIAGNOSIS — Z8616 Personal history of COVID-19: Secondary | ICD-10-CM | POA: Diagnosis not present

## 2023-09-19 DIAGNOSIS — F0283 Dementia in other diseases classified elsewhere, unspecified severity, with mood disturbance: Secondary | ICD-10-CM | POA: Diagnosis not present

## 2023-09-19 DIAGNOSIS — G311 Senile degeneration of brain, not elsewhere classified: Secondary | ICD-10-CM | POA: Diagnosis not present

## 2023-09-19 DIAGNOSIS — G20A1 Parkinson's disease without dyskinesia, without mention of fluctuations: Secondary | ICD-10-CM | POA: Diagnosis not present

## 2023-09-19 DIAGNOSIS — I10 Essential (primary) hypertension: Secondary | ICD-10-CM | POA: Diagnosis not present

## 2023-09-19 DIAGNOSIS — Z8616 Personal history of COVID-19: Secondary | ICD-10-CM | POA: Diagnosis not present

## 2023-09-19 DIAGNOSIS — R296 Repeated falls: Secondary | ICD-10-CM | POA: Diagnosis not present

## 2023-09-20 DIAGNOSIS — I10 Essential (primary) hypertension: Secondary | ICD-10-CM | POA: Diagnosis not present

## 2023-09-20 DIAGNOSIS — G20A1 Parkinson's disease without dyskinesia, without mention of fluctuations: Secondary | ICD-10-CM | POA: Diagnosis not present

## 2023-09-20 DIAGNOSIS — F0283 Dementia in other diseases classified elsewhere, unspecified severity, with mood disturbance: Secondary | ICD-10-CM | POA: Diagnosis not present

## 2023-09-20 DIAGNOSIS — R296 Repeated falls: Secondary | ICD-10-CM | POA: Diagnosis not present

## 2023-09-20 DIAGNOSIS — G311 Senile degeneration of brain, not elsewhere classified: Secondary | ICD-10-CM | POA: Diagnosis not present

## 2023-09-20 DIAGNOSIS — Z8616 Personal history of COVID-19: Secondary | ICD-10-CM | POA: Diagnosis not present

## 2023-09-21 DIAGNOSIS — F0283 Dementia in other diseases classified elsewhere, unspecified severity, with mood disturbance: Secondary | ICD-10-CM | POA: Diagnosis not present

## 2023-09-21 DIAGNOSIS — R296 Repeated falls: Secondary | ICD-10-CM | POA: Diagnosis not present

## 2023-09-21 DIAGNOSIS — I10 Essential (primary) hypertension: Secondary | ICD-10-CM | POA: Diagnosis not present

## 2023-09-21 DIAGNOSIS — G20A1 Parkinson's disease without dyskinesia, without mention of fluctuations: Secondary | ICD-10-CM | POA: Diagnosis not present

## 2023-09-21 DIAGNOSIS — Z8616 Personal history of COVID-19: Secondary | ICD-10-CM | POA: Diagnosis not present

## 2023-09-21 DIAGNOSIS — G311 Senile degeneration of brain, not elsewhere classified: Secondary | ICD-10-CM | POA: Diagnosis not present

## 2023-09-22 DIAGNOSIS — R296 Repeated falls: Secondary | ICD-10-CM | POA: Diagnosis not present

## 2023-09-22 DIAGNOSIS — F0283 Dementia in other diseases classified elsewhere, unspecified severity, with mood disturbance: Secondary | ICD-10-CM | POA: Diagnosis not present

## 2023-09-22 DIAGNOSIS — I10 Essential (primary) hypertension: Secondary | ICD-10-CM | POA: Diagnosis not present

## 2023-09-22 DIAGNOSIS — Z8616 Personal history of COVID-19: Secondary | ICD-10-CM | POA: Diagnosis not present

## 2023-09-22 DIAGNOSIS — G311 Senile degeneration of brain, not elsewhere classified: Secondary | ICD-10-CM | POA: Diagnosis not present

## 2023-09-22 DIAGNOSIS — G20A1 Parkinson's disease without dyskinesia, without mention of fluctuations: Secondary | ICD-10-CM | POA: Diagnosis not present

## 2023-09-23 DIAGNOSIS — F0283 Dementia in other diseases classified elsewhere, unspecified severity, with mood disturbance: Secondary | ICD-10-CM | POA: Diagnosis not present

## 2023-09-23 DIAGNOSIS — I10 Essential (primary) hypertension: Secondary | ICD-10-CM | POA: Diagnosis not present

## 2023-09-23 DIAGNOSIS — G20A1 Parkinson's disease without dyskinesia, without mention of fluctuations: Secondary | ICD-10-CM | POA: Diagnosis not present

## 2023-09-23 DIAGNOSIS — G311 Senile degeneration of brain, not elsewhere classified: Secondary | ICD-10-CM | POA: Diagnosis not present

## 2023-09-23 DIAGNOSIS — R296 Repeated falls: Secondary | ICD-10-CM | POA: Diagnosis not present

## 2023-09-23 DIAGNOSIS — Z8616 Personal history of COVID-19: Secondary | ICD-10-CM | POA: Diagnosis not present

## 2023-09-24 DIAGNOSIS — I10 Essential (primary) hypertension: Secondary | ICD-10-CM | POA: Diagnosis not present

## 2023-09-24 DIAGNOSIS — G20A1 Parkinson's disease without dyskinesia, without mention of fluctuations: Secondary | ICD-10-CM | POA: Diagnosis not present

## 2023-09-24 DIAGNOSIS — R296 Repeated falls: Secondary | ICD-10-CM | POA: Diagnosis not present

## 2023-09-24 DIAGNOSIS — F0283 Dementia in other diseases classified elsewhere, unspecified severity, with mood disturbance: Secondary | ICD-10-CM | POA: Diagnosis not present

## 2023-09-24 DIAGNOSIS — G311 Senile degeneration of brain, not elsewhere classified: Secondary | ICD-10-CM | POA: Diagnosis not present

## 2023-09-24 DIAGNOSIS — Z8616 Personal history of COVID-19: Secondary | ICD-10-CM | POA: Diagnosis not present

## 2023-09-25 DIAGNOSIS — F0283 Dementia in other diseases classified elsewhere, unspecified severity, with mood disturbance: Secondary | ICD-10-CM | POA: Diagnosis not present

## 2023-09-25 DIAGNOSIS — Z8616 Personal history of COVID-19: Secondary | ICD-10-CM | POA: Diagnosis not present

## 2023-09-25 DIAGNOSIS — G311 Senile degeneration of brain, not elsewhere classified: Secondary | ICD-10-CM | POA: Diagnosis not present

## 2023-09-25 DIAGNOSIS — R296 Repeated falls: Secondary | ICD-10-CM | POA: Diagnosis not present

## 2023-09-25 DIAGNOSIS — G20A1 Parkinson's disease without dyskinesia, without mention of fluctuations: Secondary | ICD-10-CM | POA: Diagnosis not present

## 2023-09-25 DIAGNOSIS — I10 Essential (primary) hypertension: Secondary | ICD-10-CM | POA: Diagnosis not present

## 2023-09-26 DIAGNOSIS — I10 Essential (primary) hypertension: Secondary | ICD-10-CM | POA: Diagnosis not present

## 2023-09-26 DIAGNOSIS — Z8616 Personal history of COVID-19: Secondary | ICD-10-CM | POA: Diagnosis not present

## 2023-09-26 DIAGNOSIS — R296 Repeated falls: Secondary | ICD-10-CM | POA: Diagnosis not present

## 2023-09-26 DIAGNOSIS — G20A1 Parkinson's disease without dyskinesia, without mention of fluctuations: Secondary | ICD-10-CM | POA: Diagnosis not present

## 2023-09-26 DIAGNOSIS — G311 Senile degeneration of brain, not elsewhere classified: Secondary | ICD-10-CM | POA: Diagnosis not present

## 2023-09-26 DIAGNOSIS — F0283 Dementia in other diseases classified elsewhere, unspecified severity, with mood disturbance: Secondary | ICD-10-CM | POA: Diagnosis not present

## 2023-09-27 DIAGNOSIS — I10 Essential (primary) hypertension: Secondary | ICD-10-CM | POA: Diagnosis not present

## 2023-09-27 DIAGNOSIS — Z9181 History of falling: Secondary | ICD-10-CM | POA: Diagnosis not present

## 2023-09-27 DIAGNOSIS — K5901 Slow transit constipation: Secondary | ICD-10-CM | POA: Diagnosis not present

## 2023-09-27 DIAGNOSIS — G3183 Dementia with Lewy bodies: Secondary | ICD-10-CM | POA: Diagnosis not present

## 2023-09-27 DIAGNOSIS — F331 Major depressive disorder, recurrent, moderate: Secondary | ICD-10-CM | POA: Diagnosis not present

## 2023-09-27 DIAGNOSIS — F02818 Dementia in other diseases classified elsewhere, unspecified severity, with other behavioral disturbance: Secondary | ICD-10-CM | POA: Diagnosis not present

## 2023-09-27 DIAGNOSIS — G20B1 Parkinson's disease with dyskinesia, without mention of fluctuations: Secondary | ICD-10-CM | POA: Diagnosis not present

## 2023-09-27 DIAGNOSIS — F0283 Dementia in other diseases classified elsewhere, unspecified severity, with mood disturbance: Secondary | ICD-10-CM | POA: Diagnosis not present

## 2023-09-27 DIAGNOSIS — G20A1 Parkinson's disease without dyskinesia, without mention of fluctuations: Secondary | ICD-10-CM | POA: Diagnosis not present

## 2023-09-27 DIAGNOSIS — Z8616 Personal history of COVID-19: Secondary | ICD-10-CM | POA: Diagnosis not present

## 2023-09-27 DIAGNOSIS — Z87440 Personal history of urinary (tract) infections: Secondary | ICD-10-CM | POA: Diagnosis not present

## 2023-09-27 DIAGNOSIS — R296 Repeated falls: Secondary | ICD-10-CM | POA: Diagnosis not present

## 2023-09-27 DIAGNOSIS — G311 Senile degeneration of brain, not elsewhere classified: Secondary | ICD-10-CM | POA: Diagnosis not present

## 2023-09-28 DIAGNOSIS — Z8616 Personal history of COVID-19: Secondary | ICD-10-CM | POA: Diagnosis not present

## 2023-09-28 DIAGNOSIS — I10 Essential (primary) hypertension: Secondary | ICD-10-CM | POA: Diagnosis not present

## 2023-09-28 DIAGNOSIS — G311 Senile degeneration of brain, not elsewhere classified: Secondary | ICD-10-CM | POA: Diagnosis not present

## 2023-09-28 DIAGNOSIS — G20A1 Parkinson's disease without dyskinesia, without mention of fluctuations: Secondary | ICD-10-CM | POA: Diagnosis not present

## 2023-09-28 DIAGNOSIS — R296 Repeated falls: Secondary | ICD-10-CM | POA: Diagnosis not present

## 2023-09-28 DIAGNOSIS — F0283 Dementia in other diseases classified elsewhere, unspecified severity, with mood disturbance: Secondary | ICD-10-CM | POA: Diagnosis not present

## 2023-09-29 DIAGNOSIS — F331 Major depressive disorder, recurrent, moderate: Secondary | ICD-10-CM | POA: Diagnosis not present

## 2023-09-29 DIAGNOSIS — F062 Psychotic disorder with delusions due to known physiological condition: Secondary | ICD-10-CM | POA: Diagnosis not present

## 2023-09-29 DIAGNOSIS — G309 Alzheimer's disease, unspecified: Secondary | ICD-10-CM | POA: Diagnosis not present

## 2023-09-29 DIAGNOSIS — R296 Repeated falls: Secondary | ICD-10-CM | POA: Diagnosis not present

## 2023-09-29 DIAGNOSIS — F0283 Dementia in other diseases classified elsewhere, unspecified severity, with mood disturbance: Secondary | ICD-10-CM | POA: Diagnosis not present

## 2023-09-29 DIAGNOSIS — G311 Senile degeneration of brain, not elsewhere classified: Secondary | ICD-10-CM | POA: Diagnosis not present

## 2023-09-29 DIAGNOSIS — F411 Generalized anxiety disorder: Secondary | ICD-10-CM | POA: Diagnosis not present

## 2023-09-29 DIAGNOSIS — F5101 Primary insomnia: Secondary | ICD-10-CM | POA: Diagnosis not present

## 2023-09-29 DIAGNOSIS — I10 Essential (primary) hypertension: Secondary | ICD-10-CM | POA: Diagnosis not present

## 2023-09-29 DIAGNOSIS — Z8616 Personal history of COVID-19: Secondary | ICD-10-CM | POA: Diagnosis not present

## 2023-09-29 DIAGNOSIS — R443 Hallucinations, unspecified: Secondary | ICD-10-CM | POA: Diagnosis not present

## 2023-09-29 DIAGNOSIS — G20A1 Parkinson's disease without dyskinesia, without mention of fluctuations: Secondary | ICD-10-CM | POA: Diagnosis not present

## 2023-09-30 DIAGNOSIS — G311 Senile degeneration of brain, not elsewhere classified: Secondary | ICD-10-CM | POA: Diagnosis not present

## 2023-09-30 DIAGNOSIS — F0283 Dementia in other diseases classified elsewhere, unspecified severity, with mood disturbance: Secondary | ICD-10-CM | POA: Diagnosis not present

## 2023-09-30 DIAGNOSIS — G20A1 Parkinson's disease without dyskinesia, without mention of fluctuations: Secondary | ICD-10-CM | POA: Diagnosis not present

## 2023-09-30 DIAGNOSIS — I10 Essential (primary) hypertension: Secondary | ICD-10-CM | POA: Diagnosis not present

## 2023-09-30 DIAGNOSIS — R296 Repeated falls: Secondary | ICD-10-CM | POA: Diagnosis not present

## 2023-09-30 DIAGNOSIS — Z8616 Personal history of COVID-19: Secondary | ICD-10-CM | POA: Diagnosis not present

## 2023-10-01 ENCOUNTER — Encounter: Payer: Self-pay | Admitting: Physician Assistant

## 2023-10-01 DIAGNOSIS — Z8616 Personal history of COVID-19: Secondary | ICD-10-CM | POA: Diagnosis not present

## 2023-10-01 DIAGNOSIS — G20A1 Parkinson's disease without dyskinesia, without mention of fluctuations: Secondary | ICD-10-CM | POA: Diagnosis not present

## 2023-10-01 DIAGNOSIS — R296 Repeated falls: Secondary | ICD-10-CM | POA: Diagnosis not present

## 2023-10-01 DIAGNOSIS — G311 Senile degeneration of brain, not elsewhere classified: Secondary | ICD-10-CM | POA: Diagnosis not present

## 2023-10-01 DIAGNOSIS — F0283 Dementia in other diseases classified elsewhere, unspecified severity, with mood disturbance: Secondary | ICD-10-CM | POA: Diagnosis not present

## 2023-10-01 DIAGNOSIS — I10 Essential (primary) hypertension: Secondary | ICD-10-CM | POA: Diagnosis not present

## 2023-10-02 DIAGNOSIS — G20A1 Parkinson's disease without dyskinesia, without mention of fluctuations: Secondary | ICD-10-CM | POA: Diagnosis not present

## 2023-10-02 DIAGNOSIS — F0283 Dementia in other diseases classified elsewhere, unspecified severity, with mood disturbance: Secondary | ICD-10-CM | POA: Diagnosis not present

## 2023-10-02 DIAGNOSIS — G311 Senile degeneration of brain, not elsewhere classified: Secondary | ICD-10-CM | POA: Diagnosis not present

## 2023-10-02 DIAGNOSIS — K5901 Slow transit constipation: Secondary | ICD-10-CM | POA: Diagnosis not present

## 2023-10-02 DIAGNOSIS — G3183 Dementia with Lewy bodies: Secondary | ICD-10-CM | POA: Diagnosis not present

## 2023-10-02 DIAGNOSIS — R296 Repeated falls: Secondary | ICD-10-CM | POA: Diagnosis not present

## 2023-10-02 DIAGNOSIS — F02818 Dementia in other diseases classified elsewhere, unspecified severity, with other behavioral disturbance: Secondary | ICD-10-CM | POA: Diagnosis not present

## 2023-10-02 DIAGNOSIS — F5101 Primary insomnia: Secondary | ICD-10-CM | POA: Diagnosis not present

## 2023-10-02 DIAGNOSIS — I10 Essential (primary) hypertension: Secondary | ICD-10-CM | POA: Diagnosis not present

## 2023-10-02 DIAGNOSIS — Z8616 Personal history of COVID-19: Secondary | ICD-10-CM | POA: Diagnosis not present

## 2023-10-03 DIAGNOSIS — G311 Senile degeneration of brain, not elsewhere classified: Secondary | ICD-10-CM | POA: Diagnosis not present

## 2023-10-03 DIAGNOSIS — F0283 Dementia in other diseases classified elsewhere, unspecified severity, with mood disturbance: Secondary | ICD-10-CM | POA: Diagnosis not present

## 2023-10-03 DIAGNOSIS — I10 Essential (primary) hypertension: Secondary | ICD-10-CM | POA: Diagnosis not present

## 2023-10-03 DIAGNOSIS — R296 Repeated falls: Secondary | ICD-10-CM | POA: Diagnosis not present

## 2023-10-03 DIAGNOSIS — Z8616 Personal history of COVID-19: Secondary | ICD-10-CM | POA: Diagnosis not present

## 2023-10-03 DIAGNOSIS — G20A1 Parkinson's disease without dyskinesia, without mention of fluctuations: Secondary | ICD-10-CM | POA: Diagnosis not present

## 2023-10-04 DIAGNOSIS — G311 Senile degeneration of brain, not elsewhere classified: Secondary | ICD-10-CM | POA: Diagnosis not present

## 2023-10-04 DIAGNOSIS — G20A1 Parkinson's disease without dyskinesia, without mention of fluctuations: Secondary | ICD-10-CM | POA: Diagnosis not present

## 2023-10-04 DIAGNOSIS — I10 Essential (primary) hypertension: Secondary | ICD-10-CM | POA: Diagnosis not present

## 2023-10-04 DIAGNOSIS — R296 Repeated falls: Secondary | ICD-10-CM | POA: Diagnosis not present

## 2023-10-04 DIAGNOSIS — Z8616 Personal history of COVID-19: Secondary | ICD-10-CM | POA: Diagnosis not present

## 2023-10-04 DIAGNOSIS — F0283 Dementia in other diseases classified elsewhere, unspecified severity, with mood disturbance: Secondary | ICD-10-CM | POA: Diagnosis not present

## 2023-10-05 ENCOUNTER — Other Ambulatory Visit: Payer: Self-pay | Admitting: Neurology

## 2023-10-05 DIAGNOSIS — Z8616 Personal history of COVID-19: Secondary | ICD-10-CM | POA: Diagnosis not present

## 2023-10-05 DIAGNOSIS — G20A1 Parkinson's disease without dyskinesia, without mention of fluctuations: Secondary | ICD-10-CM

## 2023-10-05 DIAGNOSIS — F0283 Dementia in other diseases classified elsewhere, unspecified severity, with mood disturbance: Secondary | ICD-10-CM | POA: Diagnosis not present

## 2023-10-05 DIAGNOSIS — R296 Repeated falls: Secondary | ICD-10-CM | POA: Diagnosis not present

## 2023-10-05 DIAGNOSIS — I10 Essential (primary) hypertension: Secondary | ICD-10-CM | POA: Diagnosis not present

## 2023-10-05 DIAGNOSIS — G311 Senile degeneration of brain, not elsewhere classified: Secondary | ICD-10-CM | POA: Diagnosis not present

## 2023-10-05 MED ORDER — CARBIDOPA-LEVODOPA 25-100 MG PO TABS: ORAL_TABLET | ORAL | 3 refills | Status: AC

## 2023-10-06 DIAGNOSIS — G311 Senile degeneration of brain, not elsewhere classified: Secondary | ICD-10-CM | POA: Diagnosis not present

## 2023-10-06 DIAGNOSIS — G20A1 Parkinson's disease without dyskinesia, without mention of fluctuations: Secondary | ICD-10-CM | POA: Diagnosis not present

## 2023-10-06 DIAGNOSIS — R296 Repeated falls: Secondary | ICD-10-CM | POA: Diagnosis not present

## 2023-10-06 DIAGNOSIS — Z8616 Personal history of COVID-19: Secondary | ICD-10-CM | POA: Diagnosis not present

## 2023-10-06 DIAGNOSIS — F0283 Dementia in other diseases classified elsewhere, unspecified severity, with mood disturbance: Secondary | ICD-10-CM | POA: Diagnosis not present

## 2023-10-06 DIAGNOSIS — I10 Essential (primary) hypertension: Secondary | ICD-10-CM | POA: Diagnosis not present

## 2023-10-07 DIAGNOSIS — R296 Repeated falls: Secondary | ICD-10-CM | POA: Diagnosis not present

## 2023-10-07 DIAGNOSIS — G20A1 Parkinson's disease without dyskinesia, without mention of fluctuations: Secondary | ICD-10-CM | POA: Diagnosis not present

## 2023-10-07 DIAGNOSIS — Z8616 Personal history of COVID-19: Secondary | ICD-10-CM | POA: Diagnosis not present

## 2023-10-07 DIAGNOSIS — I10 Essential (primary) hypertension: Secondary | ICD-10-CM | POA: Diagnosis not present

## 2023-10-07 DIAGNOSIS — G311 Senile degeneration of brain, not elsewhere classified: Secondary | ICD-10-CM | POA: Diagnosis not present

## 2023-10-07 DIAGNOSIS — F0283 Dementia in other diseases classified elsewhere, unspecified severity, with mood disturbance: Secondary | ICD-10-CM | POA: Diagnosis not present

## 2023-10-08 DIAGNOSIS — F0283 Dementia in other diseases classified elsewhere, unspecified severity, with mood disturbance: Secondary | ICD-10-CM | POA: Diagnosis not present

## 2023-10-08 DIAGNOSIS — Z8616 Personal history of COVID-19: Secondary | ICD-10-CM | POA: Diagnosis not present

## 2023-10-08 DIAGNOSIS — G311 Senile degeneration of brain, not elsewhere classified: Secondary | ICD-10-CM | POA: Diagnosis not present

## 2023-10-08 DIAGNOSIS — R296 Repeated falls: Secondary | ICD-10-CM | POA: Diagnosis not present

## 2023-10-08 DIAGNOSIS — G20A1 Parkinson's disease without dyskinesia, without mention of fluctuations: Secondary | ICD-10-CM | POA: Diagnosis not present

## 2023-10-08 DIAGNOSIS — I10 Essential (primary) hypertension: Secondary | ICD-10-CM | POA: Diagnosis not present

## 2023-10-09 DIAGNOSIS — G311 Senile degeneration of brain, not elsewhere classified: Secondary | ICD-10-CM | POA: Diagnosis not present

## 2023-10-09 DIAGNOSIS — F0283 Dementia in other diseases classified elsewhere, unspecified severity, with mood disturbance: Secondary | ICD-10-CM | POA: Diagnosis not present

## 2023-10-09 DIAGNOSIS — R296 Repeated falls: Secondary | ICD-10-CM | POA: Diagnosis not present

## 2023-10-09 DIAGNOSIS — I10 Essential (primary) hypertension: Secondary | ICD-10-CM | POA: Diagnosis not present

## 2023-10-09 DIAGNOSIS — Z8616 Personal history of COVID-19: Secondary | ICD-10-CM | POA: Diagnosis not present

## 2023-10-09 DIAGNOSIS — G20A1 Parkinson's disease without dyskinesia, without mention of fluctuations: Secondary | ICD-10-CM | POA: Diagnosis not present

## 2023-10-10 DIAGNOSIS — G311 Senile degeneration of brain, not elsewhere classified: Secondary | ICD-10-CM | POA: Diagnosis not present

## 2023-10-10 DIAGNOSIS — Z8616 Personal history of COVID-19: Secondary | ICD-10-CM | POA: Diagnosis not present

## 2023-10-10 DIAGNOSIS — R296 Repeated falls: Secondary | ICD-10-CM | POA: Diagnosis not present

## 2023-10-10 DIAGNOSIS — F0283 Dementia in other diseases classified elsewhere, unspecified severity, with mood disturbance: Secondary | ICD-10-CM | POA: Diagnosis not present

## 2023-10-10 DIAGNOSIS — I10 Essential (primary) hypertension: Secondary | ICD-10-CM | POA: Diagnosis not present

## 2023-10-10 DIAGNOSIS — G20A1 Parkinson's disease without dyskinesia, without mention of fluctuations: Secondary | ICD-10-CM | POA: Diagnosis not present

## 2023-10-11 DIAGNOSIS — G20A1 Parkinson's disease without dyskinesia, without mention of fluctuations: Secondary | ICD-10-CM | POA: Diagnosis not present

## 2023-10-11 DIAGNOSIS — R296 Repeated falls: Secondary | ICD-10-CM | POA: Diagnosis not present

## 2023-10-11 DIAGNOSIS — Z8616 Personal history of COVID-19: Secondary | ICD-10-CM | POA: Diagnosis not present

## 2023-10-11 DIAGNOSIS — G311 Senile degeneration of brain, not elsewhere classified: Secondary | ICD-10-CM | POA: Diagnosis not present

## 2023-10-11 DIAGNOSIS — F0283 Dementia in other diseases classified elsewhere, unspecified severity, with mood disturbance: Secondary | ICD-10-CM | POA: Diagnosis not present

## 2023-10-11 DIAGNOSIS — I10 Essential (primary) hypertension: Secondary | ICD-10-CM | POA: Diagnosis not present

## 2023-10-12 DIAGNOSIS — R296 Repeated falls: Secondary | ICD-10-CM | POA: Diagnosis not present

## 2023-10-12 DIAGNOSIS — F0283 Dementia in other diseases classified elsewhere, unspecified severity, with mood disturbance: Secondary | ICD-10-CM | POA: Diagnosis not present

## 2023-10-12 DIAGNOSIS — G301 Alzheimer's disease with late onset: Secondary | ICD-10-CM | POA: Diagnosis not present

## 2023-10-12 DIAGNOSIS — G311 Senile degeneration of brain, not elsewhere classified: Secondary | ICD-10-CM | POA: Diagnosis not present

## 2023-10-12 DIAGNOSIS — Z8616 Personal history of COVID-19: Secondary | ICD-10-CM | POA: Diagnosis not present

## 2023-10-12 DIAGNOSIS — G20A1 Parkinson's disease without dyskinesia, without mention of fluctuations: Secondary | ICD-10-CM | POA: Diagnosis not present

## 2023-10-12 DIAGNOSIS — I10 Essential (primary) hypertension: Secondary | ICD-10-CM | POA: Diagnosis not present

## 2023-10-13 DIAGNOSIS — F0283 Dementia in other diseases classified elsewhere, unspecified severity, with mood disturbance: Secondary | ICD-10-CM | POA: Diagnosis not present

## 2023-10-13 DIAGNOSIS — I10 Essential (primary) hypertension: Secondary | ICD-10-CM | POA: Diagnosis not present

## 2023-10-13 DIAGNOSIS — F411 Generalized anxiety disorder: Secondary | ICD-10-CM | POA: Diagnosis not present

## 2023-10-13 DIAGNOSIS — R443 Hallucinations, unspecified: Secondary | ICD-10-CM | POA: Diagnosis not present

## 2023-10-13 DIAGNOSIS — R001 Bradycardia, unspecified: Secondary | ICD-10-CM | POA: Diagnosis not present

## 2023-10-13 DIAGNOSIS — R296 Repeated falls: Secondary | ICD-10-CM | POA: Diagnosis not present

## 2023-10-13 DIAGNOSIS — G311 Senile degeneration of brain, not elsewhere classified: Secondary | ICD-10-CM | POA: Diagnosis not present

## 2023-10-13 DIAGNOSIS — J302 Other seasonal allergic rhinitis: Secondary | ICD-10-CM | POA: Diagnosis not present

## 2023-10-13 DIAGNOSIS — Z8616 Personal history of COVID-19: Secondary | ICD-10-CM | POA: Diagnosis not present

## 2023-10-13 DIAGNOSIS — G20A1 Parkinson's disease without dyskinesia, without mention of fluctuations: Secondary | ICD-10-CM | POA: Diagnosis not present

## 2023-10-13 DIAGNOSIS — E785 Hyperlipidemia, unspecified: Secondary | ICD-10-CM | POA: Diagnosis not present

## 2023-10-13 DIAGNOSIS — R159 Full incontinence of feces: Secondary | ICD-10-CM | POA: Diagnosis not present

## 2023-10-13 DIAGNOSIS — R32 Unspecified urinary incontinence: Secondary | ICD-10-CM | POA: Diagnosis not present

## 2023-10-13 DIAGNOSIS — G309 Alzheimer's disease, unspecified: Secondary | ICD-10-CM | POA: Diagnosis not present

## 2023-10-13 DIAGNOSIS — F062 Psychotic disorder with delusions due to known physiological condition: Secondary | ICD-10-CM | POA: Diagnosis not present

## 2023-10-13 DIAGNOSIS — H409 Unspecified glaucoma: Secondary | ICD-10-CM | POA: Diagnosis not present

## 2023-10-13 DIAGNOSIS — F5101 Primary insomnia: Secondary | ICD-10-CM | POA: Diagnosis not present

## 2023-10-13 DIAGNOSIS — F331 Major depressive disorder, recurrent, moderate: Secondary | ICD-10-CM | POA: Diagnosis not present

## 2023-10-14 IMAGING — MR MR HEAD W/O CM
6 of 10 series · 29 of 48 positions shown · non-contrast
Comparison: None.

CLINICAL DATA: Encephalopathy

EXAM:
MRI HEAD WITHOUT CONTRAST
TECHNIQUE: Multiplanar, multiecho pulse sequences of the brain and surrounding
structures were obtained without intravenous contrast.

[Series 2: DWI · axial · 3.0mm · 0.94mm/px · z∈[-97,+56]mm · 9 of 108 slices shown (1 of 2)]
[im 1/108]
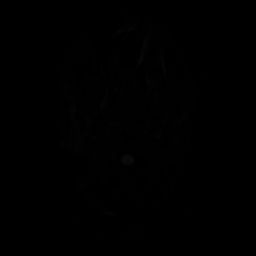
[im 14/108]
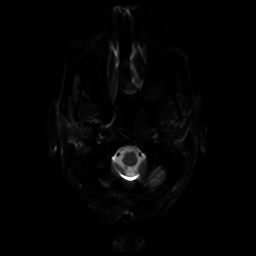
[im 27/108]
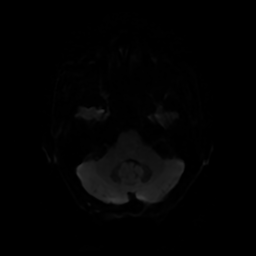
[im 41/108]
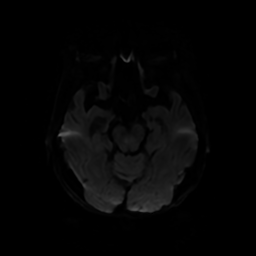
[im 54/108]
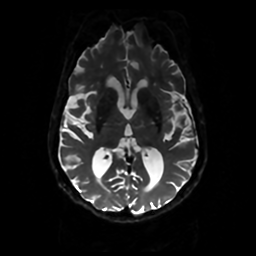
[im 67/108]
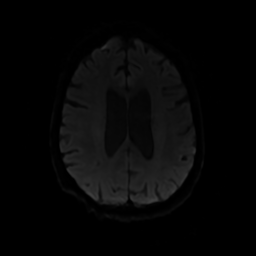
[im 81/108]
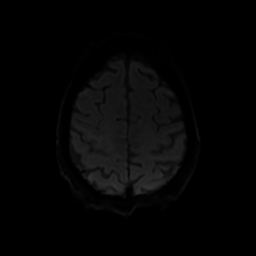
[im 94/108]
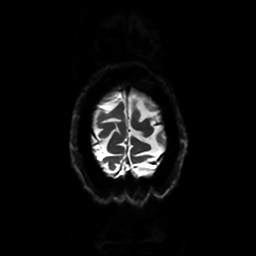
[im 108/108]
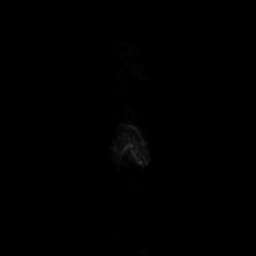

[Series 3: DWI · coronal · 4.0mm · 0.94mm/px · 7 of 78 slices shown (2 of 2)]
[im 1/78]
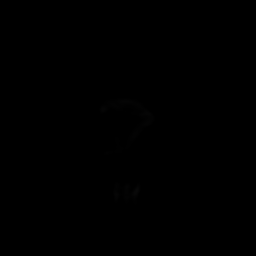
[im 13/78]
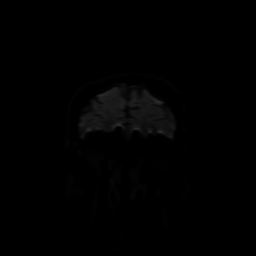
[im 26/78]
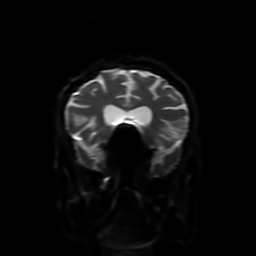
[im 39/78]
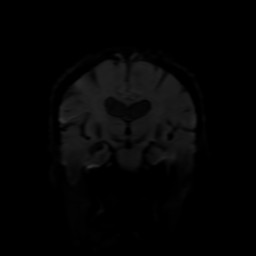
[im 52/78]
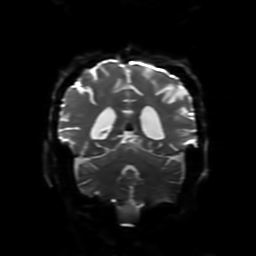
[im 65/78]
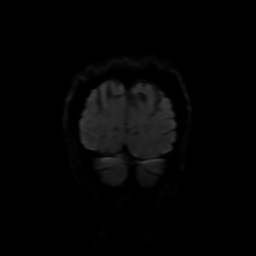
[im 78/78]
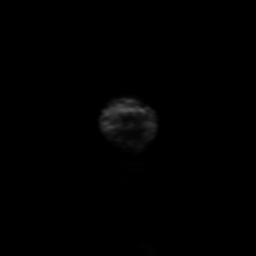

[Series 4: FLAIR · sagittal · 5.0mm · 0.23mm/px · 2 of 25 slices shown (1 of 2)]
[im 1/25]
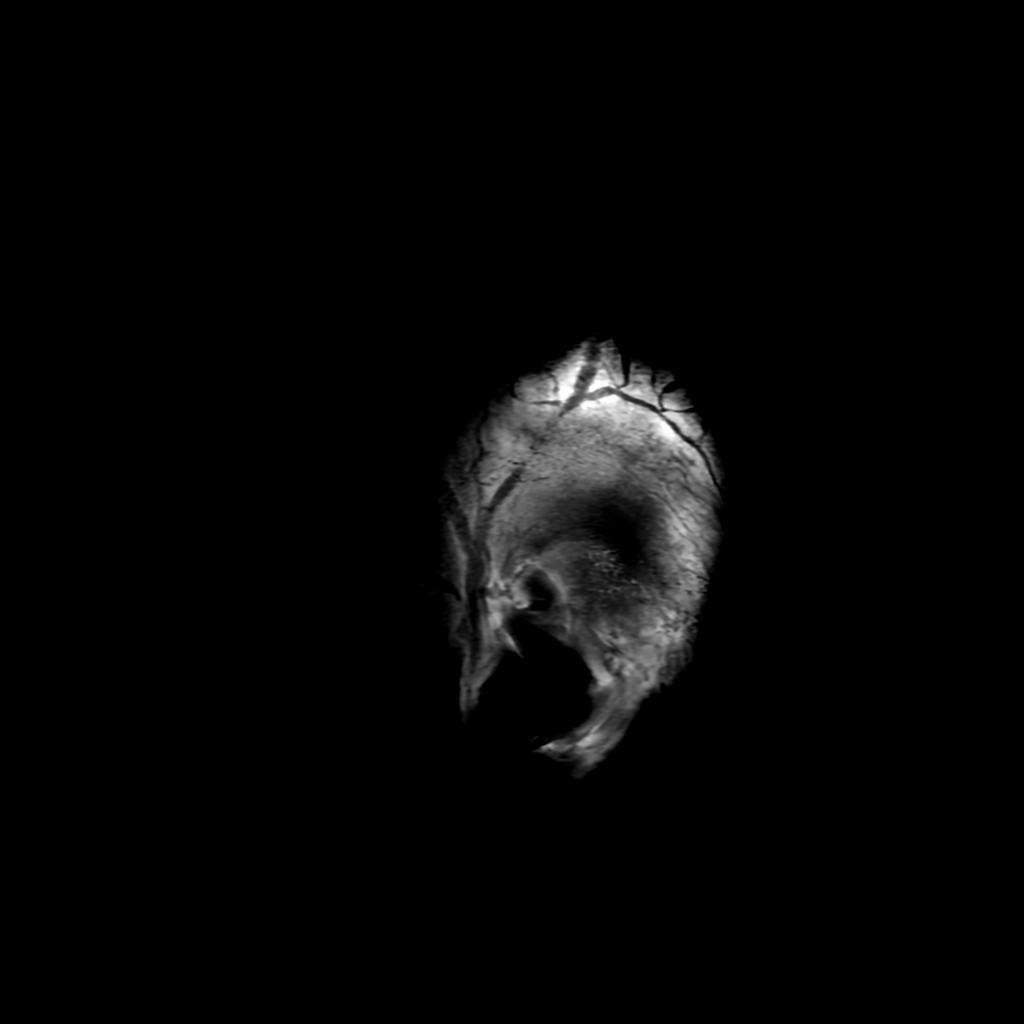
[im 25/25]
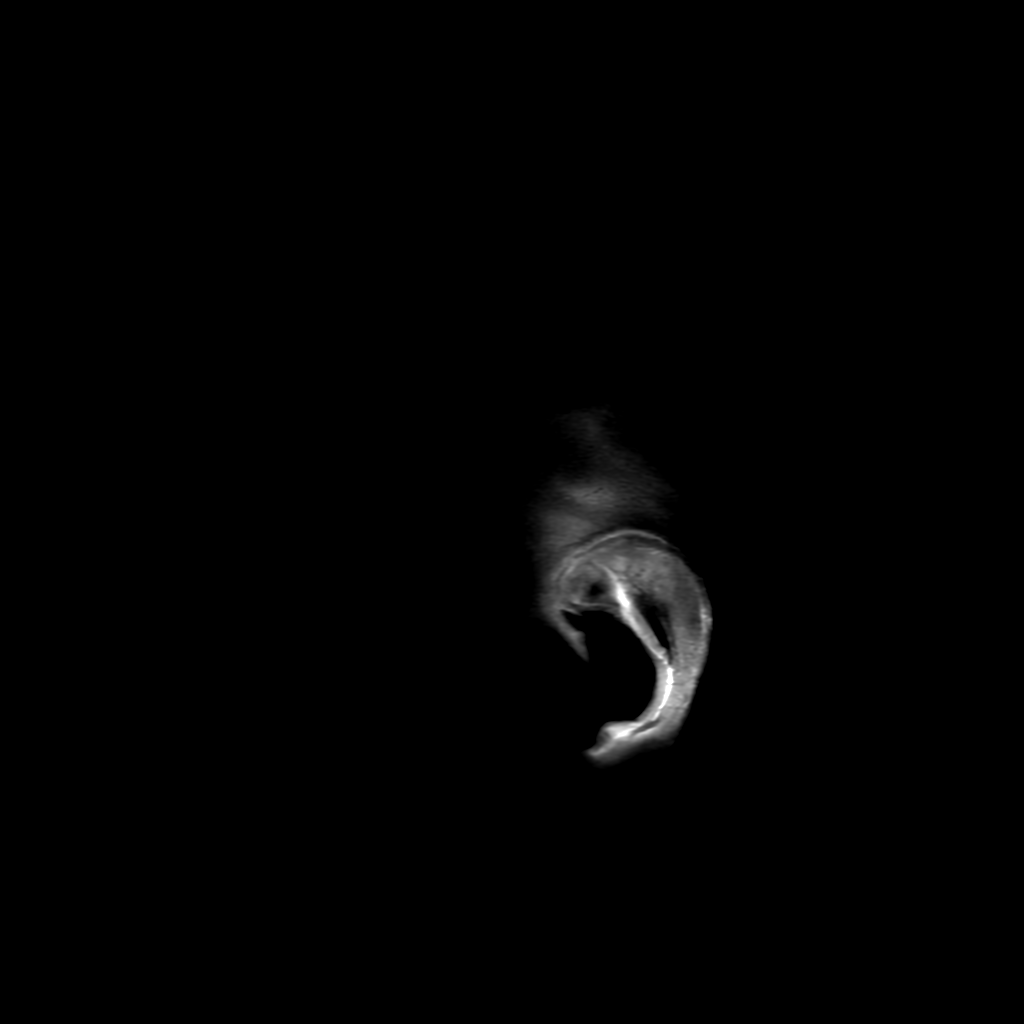

[Series 6: FLAIR · axial · 4.0mm · 0.45mm/px · z∈[-96,+56]mm · 3 of 37 slices shown (2 of 2)]
[im 1/37]
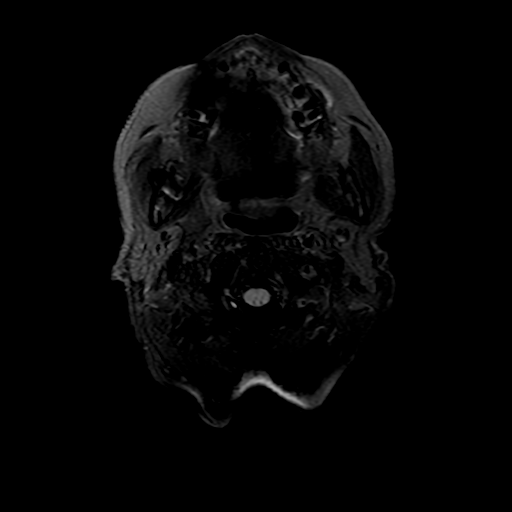
[im 19/37]
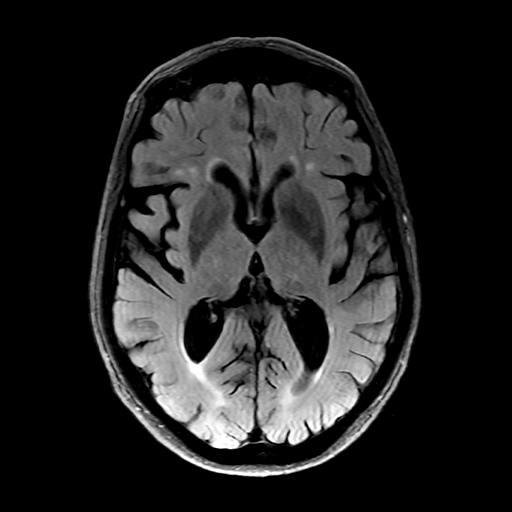
[im 37/37]
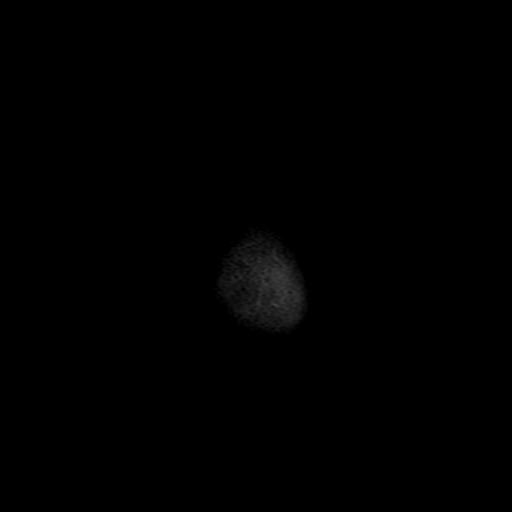

[Series 250: ADC · axial · 3.0mm · 0.94mm/px · z∈[-97,+56]mm · 5 of 54 slices shown (1 of 2)]
[im 1/54]
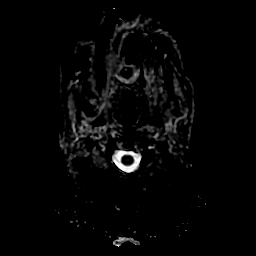
[im 14/54]
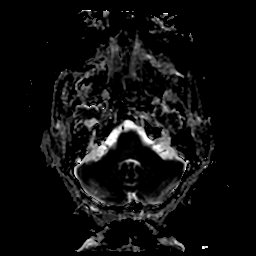
[im 27/54]
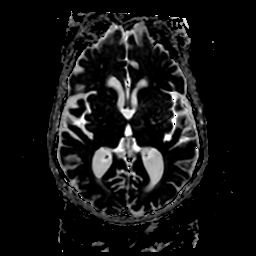
[im 40/54]
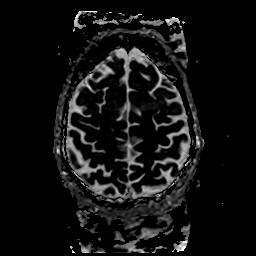
[im 54/54]
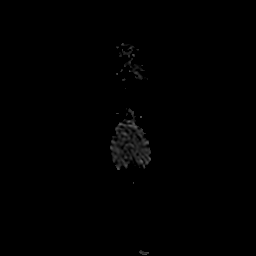

[Series 350: ADC · coronal · 4.0mm · 0.94mm/px · 3 of 39 slices shown (2 of 2)]
[im 1/39]
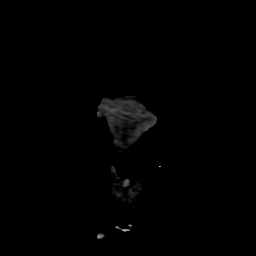
[im 20/39]
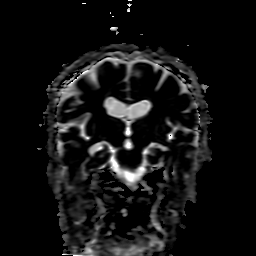
[im 39/39]
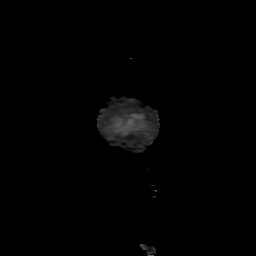

[29 of 48 positions shown; findings below may reference images not displayed]

FINDINGS: Brain: No acute infarct, mass effect or extra-axial collection. No
acute or chronic hemorrhage. There is multifocal hyperintense
T2-weighted signal within the white matter. Generalized volume loss
without a clear lobar predilection. The midline structures are
normal.

Vascular: Major flow voids are preserved.

Skull and upper cervical spine: Normal calvarium and skull base.
Visualized upper cervical spine and soft tissues are normal.

Sinuses/Orbits:No paranasal sinus fluid levels or advanced mucosal
thickening. No mastoid or middle ear effusion. Normal orbits.
IMPRESSION: 1. No acute intracranial abnormality.
2. Generalized volume loss and findings of chronic small vessel
disease.

## 2023-10-16 DIAGNOSIS — F0283 Dementia in other diseases classified elsewhere, unspecified severity, with mood disturbance: Secondary | ICD-10-CM | POA: Diagnosis not present

## 2023-10-16 DIAGNOSIS — I10 Essential (primary) hypertension: Secondary | ICD-10-CM | POA: Diagnosis not present

## 2023-10-16 DIAGNOSIS — Z8616 Personal history of COVID-19: Secondary | ICD-10-CM | POA: Diagnosis not present

## 2023-10-16 DIAGNOSIS — R296 Repeated falls: Secondary | ICD-10-CM | POA: Diagnosis not present

## 2023-10-16 DIAGNOSIS — G20A1 Parkinson's disease without dyskinesia, without mention of fluctuations: Secondary | ICD-10-CM | POA: Diagnosis not present

## 2023-10-16 DIAGNOSIS — G311 Senile degeneration of brain, not elsewhere classified: Secondary | ICD-10-CM | POA: Diagnosis not present

## 2023-10-17 DIAGNOSIS — R296 Repeated falls: Secondary | ICD-10-CM | POA: Diagnosis not present

## 2023-10-17 DIAGNOSIS — F0283 Dementia in other diseases classified elsewhere, unspecified severity, with mood disturbance: Secondary | ICD-10-CM | POA: Diagnosis not present

## 2023-10-17 DIAGNOSIS — G20A1 Parkinson's disease without dyskinesia, without mention of fluctuations: Secondary | ICD-10-CM | POA: Diagnosis not present

## 2023-10-17 DIAGNOSIS — Z8616 Personal history of COVID-19: Secondary | ICD-10-CM | POA: Diagnosis not present

## 2023-10-17 DIAGNOSIS — G311 Senile degeneration of brain, not elsewhere classified: Secondary | ICD-10-CM | POA: Diagnosis not present

## 2023-10-17 DIAGNOSIS — I10 Essential (primary) hypertension: Secondary | ICD-10-CM | POA: Diagnosis not present

## 2023-10-18 DIAGNOSIS — G311 Senile degeneration of brain, not elsewhere classified: Secondary | ICD-10-CM | POA: Diagnosis not present

## 2023-10-18 DIAGNOSIS — G20A1 Parkinson's disease without dyskinesia, without mention of fluctuations: Secondary | ICD-10-CM | POA: Diagnosis not present

## 2023-10-18 DIAGNOSIS — F0283 Dementia in other diseases classified elsewhere, unspecified severity, with mood disturbance: Secondary | ICD-10-CM | POA: Diagnosis not present

## 2023-10-18 DIAGNOSIS — Z8616 Personal history of COVID-19: Secondary | ICD-10-CM | POA: Diagnosis not present

## 2023-10-18 DIAGNOSIS — I10 Essential (primary) hypertension: Secondary | ICD-10-CM | POA: Diagnosis not present

## 2023-10-18 DIAGNOSIS — R296 Repeated falls: Secondary | ICD-10-CM | POA: Diagnosis not present

## 2023-10-19 DIAGNOSIS — F0283 Dementia in other diseases classified elsewhere, unspecified severity, with mood disturbance: Secondary | ICD-10-CM | POA: Diagnosis not present

## 2023-10-19 DIAGNOSIS — G20A1 Parkinson's disease without dyskinesia, without mention of fluctuations: Secondary | ICD-10-CM | POA: Diagnosis not present

## 2023-10-19 DIAGNOSIS — Z8616 Personal history of COVID-19: Secondary | ICD-10-CM | POA: Diagnosis not present

## 2023-10-19 DIAGNOSIS — G311 Senile degeneration of brain, not elsewhere classified: Secondary | ICD-10-CM | POA: Diagnosis not present

## 2023-10-19 DIAGNOSIS — R296 Repeated falls: Secondary | ICD-10-CM | POA: Diagnosis not present

## 2023-10-19 DIAGNOSIS — I10 Essential (primary) hypertension: Secondary | ICD-10-CM | POA: Diagnosis not present

## 2023-10-24 DIAGNOSIS — F0283 Dementia in other diseases classified elsewhere, unspecified severity, with mood disturbance: Secondary | ICD-10-CM | POA: Diagnosis not present

## 2023-10-24 DIAGNOSIS — R296 Repeated falls: Secondary | ICD-10-CM | POA: Diagnosis not present

## 2023-10-24 DIAGNOSIS — G311 Senile degeneration of brain, not elsewhere classified: Secondary | ICD-10-CM | POA: Diagnosis not present

## 2023-10-24 DIAGNOSIS — G20A1 Parkinson's disease without dyskinesia, without mention of fluctuations: Secondary | ICD-10-CM | POA: Diagnosis not present

## 2023-10-24 DIAGNOSIS — Z8616 Personal history of COVID-19: Secondary | ICD-10-CM | POA: Diagnosis not present

## 2023-10-24 DIAGNOSIS — I10 Essential (primary) hypertension: Secondary | ICD-10-CM | POA: Diagnosis not present

## 2023-10-25 ENCOUNTER — Encounter: Payer: Self-pay | Admitting: Psychiatry

## 2023-10-25 DIAGNOSIS — R296 Repeated falls: Secondary | ICD-10-CM | POA: Diagnosis not present

## 2023-10-25 DIAGNOSIS — G20A1 Parkinson's disease without dyskinesia, without mention of fluctuations: Secondary | ICD-10-CM | POA: Diagnosis not present

## 2023-10-25 DIAGNOSIS — G311 Senile degeneration of brain, not elsewhere classified: Secondary | ICD-10-CM | POA: Diagnosis not present

## 2023-10-25 DIAGNOSIS — Z8616 Personal history of COVID-19: Secondary | ICD-10-CM | POA: Diagnosis not present

## 2023-10-25 DIAGNOSIS — F0283 Dementia in other diseases classified elsewhere, unspecified severity, with mood disturbance: Secondary | ICD-10-CM | POA: Diagnosis not present

## 2023-10-25 DIAGNOSIS — I10 Essential (primary) hypertension: Secondary | ICD-10-CM | POA: Diagnosis not present

## 2023-10-26 DIAGNOSIS — F0283 Dementia in other diseases classified elsewhere, unspecified severity, with mood disturbance: Secondary | ICD-10-CM | POA: Diagnosis not present

## 2023-10-26 DIAGNOSIS — Z8616 Personal history of COVID-19: Secondary | ICD-10-CM | POA: Diagnosis not present

## 2023-10-26 DIAGNOSIS — R296 Repeated falls: Secondary | ICD-10-CM | POA: Diagnosis not present

## 2023-10-26 DIAGNOSIS — I10 Essential (primary) hypertension: Secondary | ICD-10-CM | POA: Diagnosis not present

## 2023-10-26 DIAGNOSIS — G311 Senile degeneration of brain, not elsewhere classified: Secondary | ICD-10-CM | POA: Diagnosis not present

## 2023-10-26 DIAGNOSIS — G20A1 Parkinson's disease without dyskinesia, without mention of fluctuations: Secondary | ICD-10-CM | POA: Diagnosis not present

## 2023-10-27 DIAGNOSIS — F5101 Primary insomnia: Secondary | ICD-10-CM | POA: Diagnosis not present

## 2023-10-27 DIAGNOSIS — F062 Psychotic disorder with delusions due to known physiological condition: Secondary | ICD-10-CM | POA: Diagnosis not present

## 2023-10-27 DIAGNOSIS — R443 Hallucinations, unspecified: Secondary | ICD-10-CM | POA: Diagnosis not present

## 2023-10-27 DIAGNOSIS — F331 Major depressive disorder, recurrent, moderate: Secondary | ICD-10-CM | POA: Diagnosis not present

## 2023-10-27 DIAGNOSIS — F411 Generalized anxiety disorder: Secondary | ICD-10-CM | POA: Diagnosis not present

## 2023-10-27 DIAGNOSIS — G309 Alzheimer's disease, unspecified: Secondary | ICD-10-CM | POA: Diagnosis not present

## 2023-10-30 DIAGNOSIS — G309 Alzheimer's disease, unspecified: Secondary | ICD-10-CM | POA: Diagnosis not present

## 2023-10-30 DIAGNOSIS — I10 Essential (primary) hypertension: Secondary | ICD-10-CM | POA: Diagnosis not present

## 2023-10-30 DIAGNOSIS — K5901 Slow transit constipation: Secondary | ICD-10-CM | POA: Diagnosis not present

## 2023-10-30 DIAGNOSIS — F02818 Dementia in other diseases classified elsewhere, unspecified severity, with other behavioral disturbance: Secondary | ICD-10-CM | POA: Diagnosis not present

## 2023-10-31 DIAGNOSIS — F0283 Dementia in other diseases classified elsewhere, unspecified severity, with mood disturbance: Secondary | ICD-10-CM | POA: Diagnosis not present

## 2023-10-31 DIAGNOSIS — I10 Essential (primary) hypertension: Secondary | ICD-10-CM | POA: Diagnosis not present

## 2023-10-31 DIAGNOSIS — Z8616 Personal history of COVID-19: Secondary | ICD-10-CM | POA: Diagnosis not present

## 2023-10-31 DIAGNOSIS — G311 Senile degeneration of brain, not elsewhere classified: Secondary | ICD-10-CM | POA: Diagnosis not present

## 2023-10-31 DIAGNOSIS — R296 Repeated falls: Secondary | ICD-10-CM | POA: Diagnosis not present

## 2023-10-31 DIAGNOSIS — G20A1 Parkinson's disease without dyskinesia, without mention of fluctuations: Secondary | ICD-10-CM | POA: Diagnosis not present

## 2023-11-01 DIAGNOSIS — G20A1 Parkinson's disease without dyskinesia, without mention of fluctuations: Secondary | ICD-10-CM | POA: Diagnosis not present

## 2023-11-01 DIAGNOSIS — Z8616 Personal history of COVID-19: Secondary | ICD-10-CM | POA: Diagnosis not present

## 2023-11-01 DIAGNOSIS — F0283 Dementia in other diseases classified elsewhere, unspecified severity, with mood disturbance: Secondary | ICD-10-CM | POA: Diagnosis not present

## 2023-11-01 DIAGNOSIS — R296 Repeated falls: Secondary | ICD-10-CM | POA: Diagnosis not present

## 2023-11-01 DIAGNOSIS — G311 Senile degeneration of brain, not elsewhere classified: Secondary | ICD-10-CM | POA: Diagnosis not present

## 2023-11-01 DIAGNOSIS — I10 Essential (primary) hypertension: Secondary | ICD-10-CM | POA: Diagnosis not present

## 2023-11-02 DIAGNOSIS — R296 Repeated falls: Secondary | ICD-10-CM | POA: Diagnosis not present

## 2023-11-02 DIAGNOSIS — F0283 Dementia in other diseases classified elsewhere, unspecified severity, with mood disturbance: Secondary | ICD-10-CM | POA: Diagnosis not present

## 2023-11-02 DIAGNOSIS — I10 Essential (primary) hypertension: Secondary | ICD-10-CM | POA: Diagnosis not present

## 2023-11-02 DIAGNOSIS — G20A1 Parkinson's disease without dyskinesia, without mention of fluctuations: Secondary | ICD-10-CM | POA: Diagnosis not present

## 2023-11-02 DIAGNOSIS — G311 Senile degeneration of brain, not elsewhere classified: Secondary | ICD-10-CM | POA: Diagnosis not present

## 2023-11-02 DIAGNOSIS — Z8616 Personal history of COVID-19: Secondary | ICD-10-CM | POA: Diagnosis not present

## 2023-11-05 DIAGNOSIS — F0283 Dementia in other diseases classified elsewhere, unspecified severity, with mood disturbance: Secondary | ICD-10-CM | POA: Diagnosis not present

## 2023-11-05 DIAGNOSIS — I10 Essential (primary) hypertension: Secondary | ICD-10-CM | POA: Diagnosis not present

## 2023-11-05 DIAGNOSIS — R296 Repeated falls: Secondary | ICD-10-CM | POA: Diagnosis not present

## 2023-11-05 DIAGNOSIS — Z8616 Personal history of COVID-19: Secondary | ICD-10-CM | POA: Diagnosis not present

## 2023-11-05 DIAGNOSIS — G20A1 Parkinson's disease without dyskinesia, without mention of fluctuations: Secondary | ICD-10-CM | POA: Diagnosis not present

## 2023-11-05 DIAGNOSIS — G311 Senile degeneration of brain, not elsewhere classified: Secondary | ICD-10-CM | POA: Diagnosis not present

## 2023-11-08 DIAGNOSIS — R443 Hallucinations, unspecified: Secondary | ICD-10-CM | POA: Diagnosis not present

## 2023-11-08 DIAGNOSIS — G311 Senile degeneration of brain, not elsewhere classified: Secondary | ICD-10-CM | POA: Diagnosis not present

## 2023-11-08 DIAGNOSIS — G20A1 Parkinson's disease without dyskinesia, without mention of fluctuations: Secondary | ICD-10-CM | POA: Diagnosis not present

## 2023-11-08 DIAGNOSIS — Z8616 Personal history of COVID-19: Secondary | ICD-10-CM | POA: Diagnosis not present

## 2023-11-08 DIAGNOSIS — F411 Generalized anxiety disorder: Secondary | ICD-10-CM | POA: Diagnosis not present

## 2023-11-08 DIAGNOSIS — F331 Major depressive disorder, recurrent, moderate: Secondary | ICD-10-CM | POA: Diagnosis not present

## 2023-11-08 DIAGNOSIS — R296 Repeated falls: Secondary | ICD-10-CM | POA: Diagnosis not present

## 2023-11-08 DIAGNOSIS — I10 Essential (primary) hypertension: Secondary | ICD-10-CM | POA: Diagnosis not present

## 2023-11-08 DIAGNOSIS — F062 Psychotic disorder with delusions due to known physiological condition: Secondary | ICD-10-CM | POA: Diagnosis not present

## 2023-11-08 DIAGNOSIS — F0283 Dementia in other diseases classified elsewhere, unspecified severity, with mood disturbance: Secondary | ICD-10-CM | POA: Diagnosis not present

## 2023-11-08 DIAGNOSIS — G301 Alzheimer's disease with late onset: Secondary | ICD-10-CM | POA: Diagnosis not present

## 2023-11-08 DIAGNOSIS — G309 Alzheimer's disease, unspecified: Secondary | ICD-10-CM | POA: Diagnosis not present

## 2023-11-09 DIAGNOSIS — F0283 Dementia in other diseases classified elsewhere, unspecified severity, with mood disturbance: Secondary | ICD-10-CM | POA: Diagnosis not present

## 2023-11-09 DIAGNOSIS — G20A1 Parkinson's disease without dyskinesia, without mention of fluctuations: Secondary | ICD-10-CM | POA: Diagnosis not present

## 2023-11-09 DIAGNOSIS — I10 Essential (primary) hypertension: Secondary | ICD-10-CM | POA: Diagnosis not present

## 2023-11-09 DIAGNOSIS — Z8616 Personal history of COVID-19: Secondary | ICD-10-CM | POA: Diagnosis not present

## 2023-11-09 DIAGNOSIS — R296 Repeated falls: Secondary | ICD-10-CM | POA: Diagnosis not present

## 2023-11-09 DIAGNOSIS — G311 Senile degeneration of brain, not elsewhere classified: Secondary | ICD-10-CM | POA: Diagnosis not present

## 2023-11-12 DIAGNOSIS — J302 Other seasonal allergic rhinitis: Secondary | ICD-10-CM | POA: Diagnosis not present

## 2023-11-12 DIAGNOSIS — R32 Unspecified urinary incontinence: Secondary | ICD-10-CM | POA: Diagnosis not present

## 2023-11-12 DIAGNOSIS — R296 Repeated falls: Secondary | ICD-10-CM | POA: Diagnosis not present

## 2023-11-12 DIAGNOSIS — H409 Unspecified glaucoma: Secondary | ICD-10-CM | POA: Diagnosis not present

## 2023-11-12 DIAGNOSIS — I10 Essential (primary) hypertension: Secondary | ICD-10-CM | POA: Diagnosis not present

## 2023-11-12 DIAGNOSIS — G311 Senile degeneration of brain, not elsewhere classified: Secondary | ICD-10-CM | POA: Diagnosis not present

## 2023-11-12 DIAGNOSIS — R001 Bradycardia, unspecified: Secondary | ICD-10-CM | POA: Diagnosis not present

## 2023-11-12 DIAGNOSIS — G20A1 Parkinson's disease without dyskinesia, without mention of fluctuations: Secondary | ICD-10-CM | POA: Diagnosis not present

## 2023-11-12 DIAGNOSIS — E785 Hyperlipidemia, unspecified: Secondary | ICD-10-CM | POA: Diagnosis not present

## 2023-11-12 DIAGNOSIS — Z8616 Personal history of COVID-19: Secondary | ICD-10-CM | POA: Diagnosis not present

## 2023-11-12 DIAGNOSIS — R159 Full incontinence of feces: Secondary | ICD-10-CM | POA: Diagnosis not present

## 2023-11-12 DIAGNOSIS — F0283 Dementia in other diseases classified elsewhere, unspecified severity, with mood disturbance: Secondary | ICD-10-CM | POA: Diagnosis not present

## 2023-11-13 DIAGNOSIS — G311 Senile degeneration of brain, not elsewhere classified: Secondary | ICD-10-CM | POA: Diagnosis not present

## 2023-11-13 DIAGNOSIS — F0283 Dementia in other diseases classified elsewhere, unspecified severity, with mood disturbance: Secondary | ICD-10-CM | POA: Diagnosis not present

## 2023-11-13 DIAGNOSIS — G20A1 Parkinson's disease without dyskinesia, without mention of fluctuations: Secondary | ICD-10-CM | POA: Diagnosis not present

## 2023-11-13 DIAGNOSIS — R296 Repeated falls: Secondary | ICD-10-CM | POA: Diagnosis not present

## 2023-11-13 DIAGNOSIS — I10 Essential (primary) hypertension: Secondary | ICD-10-CM | POA: Diagnosis not present

## 2023-11-13 DIAGNOSIS — Z8616 Personal history of COVID-19: Secondary | ICD-10-CM | POA: Diagnosis not present

## 2023-11-14 DIAGNOSIS — R296 Repeated falls: Secondary | ICD-10-CM | POA: Diagnosis not present

## 2023-11-14 DIAGNOSIS — Z8616 Personal history of COVID-19: Secondary | ICD-10-CM | POA: Diagnosis not present

## 2023-11-14 DIAGNOSIS — F0283 Dementia in other diseases classified elsewhere, unspecified severity, with mood disturbance: Secondary | ICD-10-CM | POA: Diagnosis not present

## 2023-11-14 DIAGNOSIS — I10 Essential (primary) hypertension: Secondary | ICD-10-CM | POA: Diagnosis not present

## 2023-11-14 DIAGNOSIS — G311 Senile degeneration of brain, not elsewhere classified: Secondary | ICD-10-CM | POA: Diagnosis not present

## 2023-11-14 DIAGNOSIS — G20A1 Parkinson's disease without dyskinesia, without mention of fluctuations: Secondary | ICD-10-CM | POA: Diagnosis not present

## 2023-11-15 DIAGNOSIS — I10 Essential (primary) hypertension: Secondary | ICD-10-CM | POA: Diagnosis not present

## 2023-11-15 DIAGNOSIS — Z8616 Personal history of COVID-19: Secondary | ICD-10-CM | POA: Diagnosis not present

## 2023-11-15 DIAGNOSIS — G20A1 Parkinson's disease without dyskinesia, without mention of fluctuations: Secondary | ICD-10-CM | POA: Diagnosis not present

## 2023-11-15 DIAGNOSIS — F0283 Dementia in other diseases classified elsewhere, unspecified severity, with mood disturbance: Secondary | ICD-10-CM | POA: Diagnosis not present

## 2023-11-15 DIAGNOSIS — R296 Repeated falls: Secondary | ICD-10-CM | POA: Diagnosis not present

## 2023-11-15 DIAGNOSIS — G311 Senile degeneration of brain, not elsewhere classified: Secondary | ICD-10-CM | POA: Diagnosis not present

## 2023-11-16 DIAGNOSIS — I10 Essential (primary) hypertension: Secondary | ICD-10-CM | POA: Diagnosis not present

## 2023-11-16 DIAGNOSIS — R296 Repeated falls: Secondary | ICD-10-CM | POA: Diagnosis not present

## 2023-11-16 DIAGNOSIS — M2041 Other hammer toe(s) (acquired), right foot: Secondary | ICD-10-CM | POA: Diagnosis not present

## 2023-11-16 DIAGNOSIS — G20A1 Parkinson's disease without dyskinesia, without mention of fluctuations: Secondary | ICD-10-CM | POA: Diagnosis not present

## 2023-11-16 DIAGNOSIS — Z8616 Personal history of COVID-19: Secondary | ICD-10-CM | POA: Diagnosis not present

## 2023-11-16 DIAGNOSIS — F0283 Dementia in other diseases classified elsewhere, unspecified severity, with mood disturbance: Secondary | ICD-10-CM | POA: Diagnosis not present

## 2023-11-16 DIAGNOSIS — G311 Senile degeneration of brain, not elsewhere classified: Secondary | ICD-10-CM | POA: Diagnosis not present

## 2023-11-16 DIAGNOSIS — B351 Tinea unguium: Secondary | ICD-10-CM | POA: Diagnosis not present

## 2023-11-21 DIAGNOSIS — R296 Repeated falls: Secondary | ICD-10-CM | POA: Diagnosis not present

## 2023-11-21 DIAGNOSIS — G311 Senile degeneration of brain, not elsewhere classified: Secondary | ICD-10-CM | POA: Diagnosis not present

## 2023-11-21 DIAGNOSIS — Z8616 Personal history of COVID-19: Secondary | ICD-10-CM | POA: Diagnosis not present

## 2023-11-21 DIAGNOSIS — F0283 Dementia in other diseases classified elsewhere, unspecified severity, with mood disturbance: Secondary | ICD-10-CM | POA: Diagnosis not present

## 2023-11-21 DIAGNOSIS — I10 Essential (primary) hypertension: Secondary | ICD-10-CM | POA: Diagnosis not present

## 2023-11-21 DIAGNOSIS — G20A1 Parkinson's disease without dyskinesia, without mention of fluctuations: Secondary | ICD-10-CM | POA: Diagnosis not present

## 2023-11-23 DIAGNOSIS — I10 Essential (primary) hypertension: Secondary | ICD-10-CM | POA: Diagnosis not present

## 2023-11-23 DIAGNOSIS — G20A1 Parkinson's disease without dyskinesia, without mention of fluctuations: Secondary | ICD-10-CM | POA: Diagnosis not present

## 2023-11-23 DIAGNOSIS — R296 Repeated falls: Secondary | ICD-10-CM | POA: Diagnosis not present

## 2023-11-23 DIAGNOSIS — F0283 Dementia in other diseases classified elsewhere, unspecified severity, with mood disturbance: Secondary | ICD-10-CM | POA: Diagnosis not present

## 2023-11-23 DIAGNOSIS — G311 Senile degeneration of brain, not elsewhere classified: Secondary | ICD-10-CM | POA: Diagnosis not present

## 2023-11-23 DIAGNOSIS — Z8616 Personal history of COVID-19: Secondary | ICD-10-CM | POA: Diagnosis not present

## 2023-11-27 DIAGNOSIS — K5901 Slow transit constipation: Secondary | ICD-10-CM | POA: Diagnosis not present

## 2023-11-27 DIAGNOSIS — G3183 Dementia with Lewy bodies: Secondary | ICD-10-CM | POA: Diagnosis not present

## 2023-11-27 DIAGNOSIS — G4700 Insomnia, unspecified: Secondary | ICD-10-CM | POA: Diagnosis not present

## 2023-11-28 DIAGNOSIS — Z8616 Personal history of COVID-19: Secondary | ICD-10-CM | POA: Diagnosis not present

## 2023-11-28 DIAGNOSIS — G311 Senile degeneration of brain, not elsewhere classified: Secondary | ICD-10-CM | POA: Diagnosis not present

## 2023-11-28 DIAGNOSIS — G20A1 Parkinson's disease without dyskinesia, without mention of fluctuations: Secondary | ICD-10-CM | POA: Diagnosis not present

## 2023-11-28 DIAGNOSIS — F0283 Dementia in other diseases classified elsewhere, unspecified severity, with mood disturbance: Secondary | ICD-10-CM | POA: Diagnosis not present

## 2023-11-28 DIAGNOSIS — I10 Essential (primary) hypertension: Secondary | ICD-10-CM | POA: Diagnosis not present

## 2023-11-28 DIAGNOSIS — R296 Repeated falls: Secondary | ICD-10-CM | POA: Diagnosis not present

## 2023-11-29 DIAGNOSIS — R296 Repeated falls: Secondary | ICD-10-CM | POA: Diagnosis not present

## 2023-11-29 DIAGNOSIS — G20A1 Parkinson's disease without dyskinesia, without mention of fluctuations: Secondary | ICD-10-CM | POA: Diagnosis not present

## 2023-11-29 DIAGNOSIS — I10 Essential (primary) hypertension: Secondary | ICD-10-CM | POA: Diagnosis not present

## 2023-11-29 DIAGNOSIS — Z8616 Personal history of COVID-19: Secondary | ICD-10-CM | POA: Diagnosis not present

## 2023-11-29 DIAGNOSIS — G311 Senile degeneration of brain, not elsewhere classified: Secondary | ICD-10-CM | POA: Diagnosis not present

## 2023-11-29 DIAGNOSIS — F0283 Dementia in other diseases classified elsewhere, unspecified severity, with mood disturbance: Secondary | ICD-10-CM | POA: Diagnosis not present

## 2023-11-30 DIAGNOSIS — R296 Repeated falls: Secondary | ICD-10-CM | POA: Diagnosis not present

## 2023-11-30 DIAGNOSIS — Z8616 Personal history of COVID-19: Secondary | ICD-10-CM | POA: Diagnosis not present

## 2023-11-30 DIAGNOSIS — F0283 Dementia in other diseases classified elsewhere, unspecified severity, with mood disturbance: Secondary | ICD-10-CM | POA: Diagnosis not present

## 2023-11-30 DIAGNOSIS — G20A1 Parkinson's disease without dyskinesia, without mention of fluctuations: Secondary | ICD-10-CM | POA: Diagnosis not present

## 2023-11-30 DIAGNOSIS — I10 Essential (primary) hypertension: Secondary | ICD-10-CM | POA: Diagnosis not present

## 2023-11-30 DIAGNOSIS — G311 Senile degeneration of brain, not elsewhere classified: Secondary | ICD-10-CM | POA: Diagnosis not present

## 2023-12-05 DIAGNOSIS — Z8616 Personal history of COVID-19: Secondary | ICD-10-CM | POA: Diagnosis not present

## 2023-12-05 DIAGNOSIS — G20A1 Parkinson's disease without dyskinesia, without mention of fluctuations: Secondary | ICD-10-CM | POA: Diagnosis not present

## 2023-12-05 DIAGNOSIS — I10 Essential (primary) hypertension: Secondary | ICD-10-CM | POA: Diagnosis not present

## 2023-12-05 DIAGNOSIS — R296 Repeated falls: Secondary | ICD-10-CM | POA: Diagnosis not present

## 2023-12-05 DIAGNOSIS — G311 Senile degeneration of brain, not elsewhere classified: Secondary | ICD-10-CM | POA: Diagnosis not present

## 2023-12-05 DIAGNOSIS — F0283 Dementia in other diseases classified elsewhere, unspecified severity, with mood disturbance: Secondary | ICD-10-CM | POA: Diagnosis not present

## 2023-12-07 DIAGNOSIS — G20A1 Parkinson's disease without dyskinesia, without mention of fluctuations: Secondary | ICD-10-CM | POA: Diagnosis not present

## 2023-12-07 DIAGNOSIS — I10 Essential (primary) hypertension: Secondary | ICD-10-CM | POA: Diagnosis not present

## 2023-12-07 DIAGNOSIS — F0283 Dementia in other diseases classified elsewhere, unspecified severity, with mood disturbance: Secondary | ICD-10-CM | POA: Diagnosis not present

## 2023-12-07 DIAGNOSIS — R296 Repeated falls: Secondary | ICD-10-CM | POA: Diagnosis not present

## 2023-12-07 DIAGNOSIS — G311 Senile degeneration of brain, not elsewhere classified: Secondary | ICD-10-CM | POA: Diagnosis not present

## 2023-12-07 DIAGNOSIS — Z8616 Personal history of COVID-19: Secondary | ICD-10-CM | POA: Diagnosis not present

## 2023-12-08 DIAGNOSIS — R443 Hallucinations, unspecified: Secondary | ICD-10-CM | POA: Diagnosis not present

## 2023-12-08 DIAGNOSIS — G309 Alzheimer's disease, unspecified: Secondary | ICD-10-CM | POA: Diagnosis not present

## 2023-12-08 DIAGNOSIS — F062 Psychotic disorder with delusions due to known physiological condition: Secondary | ICD-10-CM | POA: Diagnosis not present

## 2023-12-08 DIAGNOSIS — F411 Generalized anxiety disorder: Secondary | ICD-10-CM | POA: Diagnosis not present

## 2023-12-08 DIAGNOSIS — F331 Major depressive disorder, recurrent, moderate: Secondary | ICD-10-CM | POA: Diagnosis not present

## 2023-12-12 DIAGNOSIS — G311 Senile degeneration of brain, not elsewhere classified: Secondary | ICD-10-CM | POA: Diagnosis not present

## 2023-12-12 DIAGNOSIS — R296 Repeated falls: Secondary | ICD-10-CM | POA: Diagnosis not present

## 2023-12-12 DIAGNOSIS — G20A1 Parkinson's disease without dyskinesia, without mention of fluctuations: Secondary | ICD-10-CM | POA: Diagnosis not present

## 2023-12-12 DIAGNOSIS — Z8616 Personal history of COVID-19: Secondary | ICD-10-CM | POA: Diagnosis not present

## 2023-12-12 DIAGNOSIS — F0283 Dementia in other diseases classified elsewhere, unspecified severity, with mood disturbance: Secondary | ICD-10-CM | POA: Diagnosis not present

## 2023-12-12 DIAGNOSIS — I10 Essential (primary) hypertension: Secondary | ICD-10-CM | POA: Diagnosis not present

## 2024-02-16 ENCOUNTER — Encounter: Payer: Self-pay | Admitting: Physician Assistant

## 2024-02-22 ENCOUNTER — Telehealth (INDEPENDENT_AMBULATORY_CARE_PROVIDER_SITE_OTHER): Admitting: Physician Assistant

## 2024-02-22 DIAGNOSIS — G301 Alzheimer's disease with late onset: Secondary | ICD-10-CM | POA: Diagnosis not present

## 2024-02-22 DIAGNOSIS — G20A1 Parkinson's disease without dyskinesia, without mention of fluctuations: Secondary | ICD-10-CM | POA: Diagnosis not present

## 2024-02-22 DIAGNOSIS — F02818 Dementia in other diseases classified elsewhere, unspecified severity, with other behavioral disturbance: Secondary | ICD-10-CM | POA: Diagnosis not present

## 2024-02-22 NOTE — Progress Notes (Signed)
 Virtual Visit via Video Note The purpose of this virtual visit is to provide medical care in a patient that is unable to be seen in person due to physical or health limitations   Consent was obtained for video visit:  yes  Answered questions that patient had about telehealth interaction:  yes I discussed the limitations, risks, security and privacy concerns of performing an evaluation and management service by telemedicine. I also discussed with the patient that there may be a patient responsible charge related to this service. The patient expressed understanding and agreed to proceed.  Pt location: Home Physician Location: office Name of referring provider:  Ashley Royalty, * I connected with [name] at patients initiation/request on 02/22/2024 at  2:30 PM EDT by video enabled telemedicine application and verified that I am speaking with the correct person using two identifiers. Pt MRN:  [id number] Pt DOB:  12-07-1936 Video Participants:  [name]; daughters    Assessment and Plan:    [name]  is a 88 y.o. RH female with a history of f hypertension, anxiety, depression, dementia. Repeat Neuropsychological evaluation in 02/2021 indicated Major Neurocognitive disorder, mixed Alzheimer's disease and Parkinson's disease.  She is no longer on antidementia medications in September 2023 and this is no longer therapeutic.  Cognitive decline is noted, unable to respond to some of the answers or follow some commands during this visit although her daughter reports that at times she is able to recommend her 2 daughters and is able to converse to her ability.  For Parkinson tremors she is now on Sinemet IR 25 mg 4 times daily.  Patient is at [facility] with higher level memory care for safety due to risk for falls.  She is now on hospice care.  Daughter prefers 1 year follow-up as no aggressive cognitive treatment is indicated.  Tremors are controlled with current regimen.   Continue  Sinemet IR 25/100 4 times daily, side effects discussed Continue close supervision 24/7 for safety Continue to control mood as her NH staff Follow-up in 1 year     History of Present Illness:    Any changes in memory?  Memory is worse.  She still able to have family with her conversation with her daughters, about long-term memory issues, but not short-term memory. repeats oneself?  Denies.  Ambulates with difficulty?  She is in a wheelchair. Recent falls or head injuries?  Denies Any mood changes, agitation ?  She has moments of agitation especially in the late afternoon when sundowning comes. Hallucinations or paranoia?  She has some hallucinations involving children and adults, but they are not scary.  Facility gives her Haldol with good response.  Patient reports that sleeps well without vivid dreams, REM behavior or sleepwalking    Independent of bathing and dressing?  She showers 2 times a week with assistance Does the patient needs help with medications?  The staff is in charge. Any changes in appetite?  She needs assistance with meals, unable to feed herself, she does not know how.  She is on a soft diet.  At times she may not eat a full meal or she does not want to eat altogether, but there are some days that she eats more.   Any headaches or vision changes?  Denies Any strokelike symptoms?  Denies Any pain?  Denies Any tremors?  Endorsed.  She has a history of Parkinson's tremors, now controlled with Sinemet IR 25 100 qid  any incontinence of  urine?  She is incontinent of urine, family monitors for UTIs. Any bowel dysfunction? Denies, but incontinent of stool       History on Initial Assessment 12/18/2017: This is a pleasant 88 yo RH woman with a history of hypertension, anxiety, depression, who presented for evaluation of memory loss. She feels her memory is "a little shaky." She endorses a lot of anxiety, and states it is causing her not to recall things and she does not  understand it. Family started noticing memory changes around 2 years ago. She has been the main caregiver for her husband with dementia, who had been in and out of the hospital several times. She was "not dealing with things as well as I should." For instance, 2 years ago she was taking care of the managing their family home, but they noticed she was not keeping up with it, and turned it over to her sister. Her daughter took over finances in the Spring 2018 because she was afraid she would forget and she had never done the taxes in the past. She lives alone and denies missing medications. She denies getting lost driving. She has 3 cats at home that are cared for, she does not forget to feed them. She has no difficulties running the household, doing laundry and yardwork. She has learned how to use the leafblower. Their main concern is there anxiety and depression have "really ramped up." She is so stressed and scattered, that she could not stay on task. Family started noticing anxiety around 4-5 years ago, but in January 2017 she was "in breakdown territory" and started Lexapro and counseling. It appears she was on a very low dose due to concern for side effects. She would wake up every morning between 3-4 AM with a panic attack, unable to reason with herself, shaky. Family reports that she would forget what time they told her she would be picked up, even if it was in a text message in front of her. They have noticed lack of focus and concentration. She would ask the same question about this doctor's appointment and was quite anxious about today's visit. Family reports she "tends to catastrophize things." No paranoia or hallucinations. She was switched from Lexapro to Prozac at the family's request, and "something about Prozac bothers me all in my head."    She denies any headaches, vertigo, diplopia, dysarthria/dysphagia, neck/back pain, focal numbness/tingling/weakness, bowel/bladder dysfunction. She reports a  "mental dizziness," where she states "my brain just feels full." She has tremors, R>L. She had lost her sense of smell after sinus surgery many years ago. Her mother had dementia in her 61s, her older sister has memory issues. No history of significant head injuries, no alcohol use.    Diagnostic Data: Neuropsychological evaluation on 02/09/2021, Dr. Milbert Coulter: "Briefly, results suggested continued and severe impairment surrounding retrieval and consolidation aspects of memory. Processing speed was also impaired, while performance variability was exhibited across executive functioning and semantic fluency. Regarding etiology, I continue to have strong concerns surrounding Alzheimer's disease. Despite being able to learning information reasonably well, she was amnestic after a brief delay with evidence for a memory storage deficit and rapid forgetting. Given the results of this scan, the potential for a "mixed dementia" presentation with facets of Alzheimer's disease and Parkinson's disease remains possible."   Repeat Neuropsychological evaluation 02/2021 by Dr. Milbert Coulter indicated Major Neurocognitive Disorder with majority of scores remaining stable. Regarding etiology, still have strong concerns for AD, with mixed presentation of AD and PD  in light of DaTscan. Cognitive profile not consistent with typical LBD.   MRI brain with and without contrast 07/2020: no acute changes, mild cerebral atrophy and chronic microvascular disease   DaTscan 07/2020 showed asymmetric decreased radiotracer activity within the RIGHT stratum compared to the LEFT. There is near absent activity in the posterior RIGHT striatum (putamen) and reduced activity in the head of the RIGHT caudate nucleus. Reduced radiotracer activity in the posterior LEFT striatum     Current Outpatient Medications on File Prior to Visit  Medication Sig Dispense Refill   buPROPion ER (WELLBUTRIN SR) 100 MG 12 hr tablet TAKE ONE TABLET BY MOUTH EVERY MORNING FOR  MOOD 30 tablet 0   Calcium Carbonate-Vitamin D (CALTRATE 600+D PO) Take 1 each by mouth daily. gummie     carbidopa-levodopa (SINEMET IR) 25-100 MG tablet TAKE 1 TABLET BY MOUTH FOUR TIMES A DAY WITH MEALS 120 tablet 3   dorzolamide-timolol (COSOPT) 22.3-6.8 MG/ML ophthalmic solution Place 1 drop into both eyes 2 (two) times daily.     fluticasone (FLONASE) 50 MCG/ACT nasal spray Place 1 spray into both nostrils in the morning, at noon, and at bedtime.     haloperidol (HALDOL) 2 MG/ML solution Take by mouth.     hydrocortisone cream 1 % Apply 1 Application topically 2 (two) times daily.     Melatonin 1 MG CHEW Chew 5 mg by mouth at bedtime.     Misc Natural Products (CRANBERRY/PROBIOTIC PO) Take by mouth.     Multiple Vitamins-Minerals (MULTIVITAMIN GUMMIES ADULT PO) Take by mouth daily.     psyllium (METAMUCIL SMOOTH TEXTURE) 58.6 % powder Take 1 packet by mouth daily. (Patient taking differently: Take 1 packet by mouth as needed (constipation).) 283 g 12   sertraline (ZOLOFT) 50 MG tablet TAKE ONE TABLET BY MOUTH DAILY FOR ANXIETY 30 tablet 0   No current facility-administered medications on file prior to visit.     Observations/Objective:   There were no vitals filed for this visit. GEN:  The patient appears stated age and is in NAD.  Chronically ill-appearing  Neurological examination: patient is awake, alert, not oriented to person, place or date.. No aphasia or dysarthria.  She is able to answer very simple questions although it takes her a very long time to do so.  She has decreased  comprehension. Remote and recent memory impaired.Unable to repeat phrases. Cranial nerves: Extraocular movements intact with no nystagmus. No facial asymmetry. Motor: moves all extremities symmetrically, at least anti-gravity .unable to test coordination on finger-to-nose testing, the patient is unable to cooperate.  Unable to test the gait, she is wheelchair-bound.    Follow Up Instructions:    -I  discussed the assessment and treatment plan with the patient. The patient was provided an opportunity to ask questions and all were answered. The patient agreed with the plan and demonstrated an understanding of the instructions.   The patient was advised to call back or seek an in-person evaluation if the symptoms worsen or if the condition fails to improve as anticipated.    Total time spent on today's visit was , including both face-to-face time and nonface-to-face time.  Time included that spent on review of records (prior notes available to me/labs/imaging if pertinent), discussing treatment and goals, answering patient's questions and coordinating care.   Marlowe Kays, PA-C

## 2024-08-08 ENCOUNTER — Other Ambulatory Visit: Payer: Self-pay

## 2024-08-08 ENCOUNTER — Emergency Department (HOSPITAL_COMMUNITY)

## 2024-08-08 ENCOUNTER — Emergency Department (HOSPITAL_COMMUNITY)
Admission: EM | Admit: 2024-08-08 | Discharge: 2024-08-08 | Disposition: A | Discharge status: Home / Self Care | Attending: Emergency Medicine | Admitting: Emergency Medicine

## 2024-08-08 DIAGNOSIS — S42032A Displaced fracture of lateral end of left clavicle, initial encounter for closed fracture: Secondary | ICD-10-CM | POA: Insufficient documentation

## 2024-08-08 DIAGNOSIS — R8290 Unspecified abnormal findings in urine: Secondary | ICD-10-CM | POA: Insufficient documentation

## 2024-08-08 DIAGNOSIS — S01112A Laceration without foreign body of left eyelid and periocular area, initial encounter: Secondary | ICD-10-CM | POA: Diagnosis not present

## 2024-08-08 DIAGNOSIS — S81012A Laceration without foreign body, left knee, initial encounter: Secondary | ICD-10-CM | POA: Diagnosis not present

## 2024-08-08 DIAGNOSIS — S42122A Displaced fracture of acromial process, left shoulder, initial encounter for closed fracture: Secondary | ICD-10-CM | POA: Diagnosis not present

## 2024-08-08 DIAGNOSIS — R001 Bradycardia, unspecified: Secondary | ICD-10-CM | POA: Insufficient documentation

## 2024-08-08 DIAGNOSIS — F0280 Dementia in other diseases classified elsewhere without behavioral disturbance: Secondary | ICD-10-CM | POA: Insufficient documentation

## 2024-08-08 DIAGNOSIS — W19XXXA Unspecified fall, initial encounter: Secondary | ICD-10-CM | POA: Insufficient documentation

## 2024-08-08 DIAGNOSIS — S0181XA Laceration without foreign body of other part of head, initial encounter: Secondary | ICD-10-CM | POA: Insufficient documentation

## 2024-08-08 DIAGNOSIS — G20C Parkinsonism, unspecified: Secondary | ICD-10-CM | POA: Diagnosis not present

## 2024-08-08 DIAGNOSIS — S0990XA Unspecified injury of head, initial encounter: Secondary | ICD-10-CM | POA: Diagnosis not present

## 2024-08-08 DIAGNOSIS — Z79899 Other long term (current) drug therapy: Secondary | ICD-10-CM | POA: Diagnosis not present

## 2024-08-08 DIAGNOSIS — W06XXXA Fall from bed, initial encounter: Secondary | ICD-10-CM | POA: Diagnosis not present

## 2024-08-08 DIAGNOSIS — M25512 Pain in left shoulder: Secondary | ICD-10-CM | POA: Diagnosis present

## 2024-08-08 LAB — COMPREHENSIVE METABOLIC PANEL WITH GFR
ALT: 21 U/L (ref 0–44)
AST: 21 U/L (ref 15–41)
Albumin: 3.1 g/dL — ABNORMAL LOW (ref 3.5–5.0)
Alkaline Phosphatase: 60 U/L (ref 38–126)
Anion gap: 8 (ref 5–15)
BUN: 21 mg/dL (ref 8–23)
CO2: 26 mmol/L (ref 22–32)
Calcium: 8.8 mg/dL — ABNORMAL LOW (ref 8.9–10.3)
Chloride: 106 mmol/L (ref 98–111)
Creatinine, Ser: 0.84 mg/dL (ref 0.44–1.00)
GFR, Estimated: >60 mL/min (ref >60)
Glucose, Bld: 85 mg/dL (ref 70–99)
Potassium: 4.2 mmol/L (ref 3.5–5.1)
Sodium: 140 mmol/L (ref 135–145)
Total Bilirubin: 0.7 mg/dL (ref 0.0–1.2)
Total Protein: 5.5 g/dL — ABNORMAL LOW (ref 6.5–8.1)

## 2024-08-08 LAB — CBC WITH DIFFERENTIAL/PLATELET
Abs Immature Granulocytes: 0.03 K/uL (ref 0.00–0.07)
Basophils Absolute: 0.1 K/uL (ref 0.0–0.1)
Basophils Relative: 1 %
Eosinophils Absolute: 0.1 K/uL (ref 0.0–0.5)
Eosinophils Relative: 1 %
HCT: 37.6 % (ref 36.0–46.0)
Hemoglobin: 12.5 g/dL (ref 12.0–15.0)
Immature Granulocytes: 0 %
Lymphocytes Relative: 15 %
Lymphs Abs: 1.3 K/uL (ref 0.7–4.0)
MCH: 31.3 pg (ref 26.0–34.0)
MCHC: 33.2 g/dL (ref 30.0–36.0)
MCV: 94.2 fL (ref 80.0–100.0)
Monocytes Absolute: 0.5 K/uL (ref 0.1–1.0)
Monocytes Relative: 6 %
Neutro Abs: 6.7 K/uL (ref 1.7–7.7)
Neutrophils Relative %: 77 %
Platelets: 231 K/uL (ref 150–400)
RBC: 3.99 MIL/uL (ref 3.87–5.11)
RDW: 12.6 % (ref 11.5–15.5)
WBC: 8.7 K/uL (ref 4.0–10.5)
nRBC: 0 % (ref 0.0–0.2)

## 2024-08-08 LAB — URINALYSIS, ROUTINE W REFLEX MICROSCOPIC
Bilirubin Urine: NEGATIVE
Glucose, UA: NEGATIVE mg/dL
Hgb urine dipstick: NEGATIVE
Ketones, ur: NEGATIVE mg/dL
Leukocytes,Ua: NEGATIVE
Nitrite: POSITIVE — AB
Protein, ur: NEGATIVE mg/dL
Specific Gravity, Urine: 1.015 (ref 1.005–1.030)
pH: 7 (ref 5.0–8.0)

## 2024-08-08 LAB — CK: Total CK: 41 U/L (ref 38–234)

## 2024-08-08 MED ORDER — TRIPLE ANTIBIOTIC 3.5-400-5000 EX OINT: 1.0000 | TOPICAL_OINTMENT | Freq: Every day | CUTANEOUS | 0 refills | Status: AC

## 2024-08-08 MED ORDER — LIDOCAINE-EPINEPHRINE (PF) 2 %-1:200000 IJ SOLN
INTRAMUSCULAR | Status: AC
  Filled 2024-08-08: qty 20

## 2024-08-08 MED ORDER — LIDOCAINE 5 % EX PTCH: 1.0000 | MEDICATED_PATCH | CUTANEOUS | 0 refills | Status: AC

## 2024-08-08 MED ORDER — LIDOCAINE HCL (PF) 1 % IJ SOLN
INTRAMUSCULAR | Status: AC
  Filled 2024-08-08: qty 5

## 2024-08-08 MED ORDER — ACETAMINOPHEN 160 MG/5ML PO SOLN
650.0000 mg | Freq: Four times a day (QID) | ORAL | 0 refills | Status: AC | PRN
Start: 2024-08-08 — End: ?

## 2024-08-08 NOTE — ED Notes (Signed)
 [facility] called;4th in line

## 2024-08-08 NOTE — Progress Notes (Signed)
 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  Patient is a DNR and Clement [phone] is the patient's primary caregiver.  We will continue to follow for any discharge planning needs and to coordinate continuation of hospice care.  Please don't hesitate to call with any Hospice related questions or concerns.  Thank you for the opportunity to participate in this patient's care.  Inocente Jacobs RN, BSN [facility] Liaison [phone]

## 2024-08-08 NOTE — ED Notes (Signed)
 Transported to xray

## 2024-08-08 NOTE — Discharge Instructions (Addendum)
 You were seen for your cut (laceration) to your forehead and broken collarbone (clavicle) in the emergency department.   At home, please keep the area completely dry for 24 hours.  After that you may wash with soap and water but do not submerge until your stitches are removed or they fall out.    Scars are unavoidable but they can be less noticeable if you prevent sun exposure by covering with clothing or wearing sunscreen over the area for the next 6 months.   Follow-up with orthopedics in several weeks about your collarbone fracture.  Return immediately to the emergency department if you experience any of the following: Drainage from your wound, redness around your wound, fevers, or any other concerning symptoms.    Thank you for visiting our Emergency Department. It was a pleasure taking care of you today.

## 2024-08-08 NOTE — ED Notes (Signed)
 Pt returned from xray d/t became brady at 33 bpm & was reported her other needed images can be done portable.

## 2024-08-08 NOTE — ED Notes (Signed)
 EDP at bedside cleaning/suturing facial lac.

## 2024-08-08 NOTE — ED Provider Notes (Signed)
 [place] [facility]GENC[facility] DEPARTMENT AT [facility] Provider Note   CSN: [id number] Arrival date & time: 08/08/24  9278     Patient presents with: Fall and Lac above/below Lt eye   [name] is a 88 y.o. female.   88 year old female with a history of Parkinson's and dementia who presents emergency department after a fall.  Was found at her facility on the ground with a laceration to her left forehead.  Unknown how long she was down for.  Patient does not talk much at baseline per family and cannot provide additional history.  Not on blood thinners.  Up-to-date on her tetanus       Prior to Admission medications   Medication Sig Start Date End Date Taking? Authorizing Provider  acetaminophen  (TYLENOL ) 160 MG/5ML solution Take 20.3 mLs (650 mg total) by mouth every 6 (six) hours as needed. 08/08/24  Yes Yolande Lamar BROCKS, MD  lidocaine  (LIDODERM ) 5 % Place 1 patch onto the skin daily. Remove & Discard patch within 12 hours or as directed by MD 08/08/24  Yes Yolande Lamar BROCKS, MD  neomycin-bacitracin -polymyxin [phone] OINT Apply 1 Application topically daily. 08/08/24  Yes Yolande Lamar BROCKS, MD  buPROPion  ER (WELLBUTRIN  SR) 100 MG 12 hr tablet TAKE ONE TABLET BY MOUTH EVERY MORNING FOR MOOD 06/17/22   Georjean Darice HERO, MD  Calcium  Carbonate-Vitamin D  (CALTRATE 600+D PO) Take 1 each by mouth daily. gummie    [provider]  carbidopa -levodopa  (SINEMET  IR) 25-100 MG tablet TAKE 1 TABLET BY MOUTH FOUR TIMES A DAY WITH MEALS 10/05/23   Leigh Venetia CROME, MD  dorzolamide -timolol  (COSOPT ) 22.3-6.8 MG/ML ophthalmic solution Place 1 drop into both eyes 2 (two) times daily.    [provider]  fluticasone  (FLONASE ) 50 MCG/ACT nasal spray Place 1 spray into both nostrils in the morning, at noon, and at bedtime.    [provider]  haloperidol (HALDOL) 2 MG/ML solution Take by mouth. 08/01/22   [provider]  hydrocortisone cream 1 % Apply 1 Application  topically 2 (two) times daily.    [provider]  Melatonin 1 MG CHEW Chew 5 mg by mouth at bedtime.    [provider]  Misc Natural Products (CRANBERRY/PROBIOTIC PO) Take by mouth.    [provider]  Multiple Vitamins-Minerals (MULTIVITAMIN GUMMIES ADULT PO) Take by mouth daily.    [provider]  psyllium (METAMUCIL SMOOTH TEXTURE) 58.6 % powder Take 1 packet by mouth daily. Patient taking differently: Take 1 packet by mouth as needed (constipation). 08/03/20   Koberlein, Junell C, MD  sertraline  (ZOLOFT ) 50 MG tablet TAKE ONE TABLET BY MOUTH DAILY FOR ANXIETY 06/17/22   Georjean Darice HERO, MD    Allergies: Aricept  [donepezil  hcl], Cephalexin, Lorazepam , Other, and Seroquel  [quetiapine ]    Review of Systems  Updated Vital Signs BP (!) 165/69   Pulse 62   Temp 97.8 F (36.6 C)   Resp 15   Ht 5' 4 (1.626 m)   Wt 42.6 kg   SpO2 99%   BMI 16.14 kg/m   Physical Exam Constitutional:      General: She is not in acute distress.    Appearance: Normal appearance. She is not ill-appearing.  HENT:     Head: Normocephalic.     Comments: 3 cm laceration above left eyebrow.  1 cm laceration below left eye.    Right Ear: External ear normal.     Left Ear: External ear normal.  Mouth/Throat:     Mouth: Mucous membranes are moist.     Pharynx: Oropharynx is clear.  Eyes:     Extraocular Movements: Extraocular movements intact.     Conjunctiva/sclera: Conjunctivae normal.     Pupils: Pupils are equal, round, and reactive to light.     Comments: Pupils 4 mm bilaterally  Neck:     Comments: C-collar in place Cardiovascular:     Rate and Rhythm: Regular rhythm. Bradycardia present.     Pulses: Normal pulses.     Heart sounds: Normal heart sounds.  Pulmonary:     Effort: Pulmonary effort is normal. No respiratory distress.     Breath sounds: Normal breath sounds.  Abdominal:     General: Abdomen is flat. There is no distension.     Palpations:  Abdomen is soft. There is no mass.     Tenderness: There is no abdominal tenderness. There is no guarding.  Musculoskeletal:        General: No deformity. Normal range of motion.     Comments: No tenderness to palpation of midline thoracic or lumbar spine.  No step-offs palpated.  No tenderness to palpation of chest wall.  No bruising noted.  No tenderness to palpation of bilateral clavicles.  No tenderness to palpation, bruising, or deformities noted of bilateral shoulders, elbows, wrists, hips, or ankles.  Bruising of left knee with 4 cm skin tear  Neurological:     Mental Status: She is alert.     Comments: Does not cooperate with neuroexam.  Appears to be moving all 4 extremities equally.     (all labs ordered are listed, but only abnormal results are displayed) Labs Reviewed  COMPREHENSIVE METABOLIC PANEL WITH GFR - Abnormal; Notable for the following components:      Result Value   Calcium  8.8 (*)    Total Protein 5.5 (*)    Albumin 3.1 (*)    All other components within normal limits  URINALYSIS, ROUTINE W REFLEX MICROSCOPIC - Abnormal; Notable for the following components:   APPearance CLOUDY (*)    Nitrite POSITIVE (*)    Bacteria, UA MANY (*)    All other components within normal limits  URINE CULTURE  CBC WITH DIFFERENTIAL/PLATELET  CK    EKG: EKG Interpretation Date/Time:  Thursday August 08 2024 08:22:50 EDT Ventricular Rate:  52 PR Interval:  207 QRS Duration:  84 QT Interval:  446 QTC Calculation: 415 R Axis:   111  Text Interpretation: Sinus or ectopic atrial rhythm Right axis deviation Borderline T abnormalities, anterior leads Confirmed by Yolande Charleston [phone]) on 08/08/2024 12:49:11 PM  Radiology: ARCOLA Clavicle Left Result Date: 08/08/2024 CLINICAL DATA:  Left clavicular pain following a fall out of bed. EXAM: LEFT CLAVICLE - 2+ VIEWS COMPARISON:  08/17/2022 FINDINGS: Interval mildly comminuted distal left clavicle fracture. Mild superior displacement  and overlap of the distal fragment. IMPRESSION: Mildly comminuted distal left clavicle fracture. Electronically Signed   By: Elspeth Bathe M.D.   On: 08/08/2024 10:16   CT Chest Wo Contrast Result Date: 08/08/2024 EXAM: CT CHEST WITHOUT CONTRAST 08/08/2024 09:00:00 AM TECHNIQUE: CT of the chest was performed without the administration of intravenous contrast. Multiplanar reformatted images are provided for review. Automated exposure control, iterative reconstruction, and/or weight based adjustment of the mA/kV was utilized to reduce the radiation dose to as low as reasonably achievable. COMPARISON: None available. CLINICAL HISTORY: Chest trauma. Patient brought in by EMS from [facility] due to being found on the  ground at approximately 0415 after falling out of bed, unknown downtime until found. Patient has lacerations above left eyebrow and below the left eye, not on blood thinners. History of dementia, family on scene reports patient is oriented and at baseline. C-collar applied, no PIV, blood pressure 132/78, heart rate 56 bpm, respiratory rate 16, CBG 84. FINDINGS: MEDIASTINUM AND LYMPH NODES: There is moderate calcific coronary artery disease present. There is fusiform ectasia of the ascending thoracic aorta, which measures approximately 3.4 cm in diameter. There is moderate calcific atheromatous disease within the aortic arch and descending thoracic aorta. LUNGS AND PLEURA: There is dependent atelectasis within the lungs bilaterally, most pronounced in the right lower lobe. No pleural effusion or pneumothorax. SOFT TISSUES/BONES: There are mild chronic-appearing compression deformities of T11 and T12. There is mild chronic-appearing downward bowing of the superior endplate of T5. There are no apparent acute fractures. UPPER ABDOMEN: Limited images of the upper abdomen demonstrates no acute abnormality. IMPRESSION: 1. No acute findings. Electronically signed by: Evalene Coho MD 08/08/2024 09:34 AM  EDT RP Workstation: [id number]   CT Cervical Spine Wo Contrast Result Date: 08/08/2024 EXAM: CT CERVICAL SPINE WITHOUT CONTRAST 08/08/2024 09:00:00 AM TECHNIQUE: CT of the cervical spine was performed without the administration of intravenous contrast. Multiplanar reformatted images are provided for review. Automated exposure control, iterative reconstruction, and/or weight based adjustment of the mA/kV was utilized to reduce the radiation dose to as low as reasonably achievable. COMPARISON: CT of the cervical spine dated 08/17/2022. CLINICAL HISTORY: Fall. Patient brought in by EMS from [facility] due to being found on the ground at approximately 0415 after falling out of bed, unknown downtime until found. Patient has lacerations above left eyebrow and below the left eye, not on blood thinners. History of dementia, family on scene reports patient is oriented and at baseline. Cervical collar applied, no peripheral IV, blood pressure 132/78, heart rate 56 bpm, respiratory rate 16, capillary blood glucose 84. FINDINGS: CERVICAL SPINE: BONES AND ALIGNMENT: Anterolisthesis again demonstrated at C4-5. DEGENERATIVE CHANGES: Moderate chronic degenerative disc disease at C5-6 and C6-7 with endplate ridging causing mild-to-moderate central spinal canal stenosis and bilateral neural foraminal stenosis. Diffuse facet arthrosis throughout the cervical spine, more pronounced on the left. SOFT TISSUES: Calcifications within the carotid bulbs. IMPRESSION: 1. No acute abnormality of the cervical spine related to the fall. 2. Anterolisthesis at C4-5 and moderate chronic degenerative disc disease at C5-6 and C6-7 with endplate ridging causing mild-to-moderate central spinal canal stenosis and bilateral neural foraminal stenosis. Electronically signed by: Evalene Coho MD 08/08/2024 09:30 AM EDT RP Workstation: [id number]   CT Maxillofacial Wo Contrast Result Date: 08/08/2024 EXAM: CT OF THE FACE WITHOUT CONTRAST  08/08/2024 09:00:00 AM TECHNIQUE: CT of the face was performed without the administration of intravenous contrast. Multiplanar reformatted images are provided for review. Automated exposure control, iterative reconstruction, and/or weight based adjustment of the mA/kV was utilized to reduce the radiation dose to as low as reasonably achievable. COMPARISON: None available. CLINICAL HISTORY: Facial trauma. Patient brought in by [facility] from [facility] due to being found on the ground at approximately 0415 from falling out of bed, unknown downtime until found. Does have lacerations above left eyebrow and below the left eye, not on blood thinners. History of dementia, family on scene reports she is oriented and at baseline. C-collar applied, no PIV, 132/78, 56 bpm, 16 resp, CBG 84. FINDINGS: FACIAL BONES: The patient is status post orthopedic fixation of the maxilla and mandible. There  are no acute fractures evident. Prominent osteophytes are noted arising from the hard palate. ORBITS: The patient is status post bilateral lens replacement. Globes are intact. No acute traumatic injury. No inflammatory change. SINUSES AND MASTOIDS: No acute abnormality. SOFT TISSUES: There is no significant soft tissue swelling. IMPRESSION: 1. No acute facial fracture. 2. Status post orthopedic fixation of the maxilla and mandible. Electronically signed by: Evalene Coho MD 08/08/2024 09:27 AM EDT RP Workstation: [id number]   CT Head Wo Contrast Result Date: 08/08/2024 EXAM: CT HEAD WITHOUT CONTRAST 08/08/2024 09:00:00 AM TECHNIQUE: CT of the head was performed without the administration of intravenous contrast. Automated exposure control, iterative reconstruction, and/or weight based adjustment of the mA/kV was utilized to reduce the radiation dose to as low as reasonably achievable. COMPARISON: CT of the head dated 08/17/2022. CLINICAL HISTORY: Fall. Patient brought in by [facility] from [facility] due to being found on the  ground at approximately 0415 after falling out of bed, unknown downtime until found. Does have lacerations above left eyebrow and below the left eye, not on blood thinners. History of dementia, family on scene reports she is oriented and at baseline. C-collar applied, no PIV, 132/78, 56 bpm, 16 resp, CBG 84. FINDINGS: BRAIN AND VENTRICLES: No acute hemorrhage. Gray-white differentiation is preserved. No hydrocephalus. No extra-axial collection. No mass effect or midline shift. Moderate generalized cerebral volume loss and mild-to-moderate periventricular white matter disease. ORBITS: No acute abnormality. Patient is status post bilateral lens replacement. SINUSES: No acute abnormality. SOFT TISSUES AND SKULL: No acute soft tissue abnormality. No skull fracture. Patient is status post orthopedic fixation of the maxilla. IMPRESSION: 1. No acute intracranial abnormality. 2. Moderate generalized cerebral volume loss and mild-to-moderate periventricular white matter disease. Electronically signed by: Evalene Coho MD 08/08/2024 09:23 AM EDT RP Workstation: [id number]   DG Knee Complete 4 Views Left Result Date: 08/08/2024 CLINICAL DATA:  knee trauma. EXAM: LEFT KNEE - COMPLETE 4+ VIEW COMPARISON:  None Available. FINDINGS: No acute fracture or dislocation. No aggressive osseous lesion. There are degenerative changes of the knee joint in the form of mildly reduced medial tibio-femoral compartment joint space, tibial spiking and osteophytosis. No knee effusion or focal soft tissue swelling. No radiopaque foreign bodies. IMPRESSION: *No acute osseous abnormality of the left knee joint. Mild degenerative changes of the left knee joint. Electronically Signed   By: Ree Molt M.D.   On: 08/08/2024 08:48   DG Hip Unilat W or Wo Pelvis 2-3 Views Left Result Date: 08/08/2024 CLINICAL DATA:  Knee trauma.  Unwitnessed fall. EXAM: DG HIP (WITH OR WITHOUT PELVIS) 2-3V LEFT COMPARISON:  None Available. FINDINGS: Pelvis is  intact with normal and symmetric sacroiliac joints. No acute fracture or dislocation. No aggressive osseous lesion. Visualized sacral arcuate lines are unremarkable. Unremarkable symphysis pubis. There are mild degenerative changes of bilateral hip joints without significant joint space narrowing. Osteophytosis of the superior acetabulum. No radiopaque foreign bodies. IMPRESSION: No acute osseous abnormality of the pelvis or left hip joint. Electronically Signed   By: Ree Molt M.D.   On: 08/08/2024 08:48   DG Chest 1 View Result Date: 08/08/2024 CLINICAL DATA:  [id number] Trauma [id number]. EXAM: CHEST  1 VIEW COMPARISON:  08/17/2022. FINDINGS: Low lung volume. Bilateral lung fields are clear. Redemonstration of elevated right hemidiaphragm. Bilateral costophrenic angles are clear. No pneumothorax. Normal cardio-mediastinal silhouette. There is mildly displaced fracture along the lateral aspect of the left clavicle, which is new since the prior study. The fracture margins appear sharp  however, there is no significant surrounding soft tissue swelling. Findings favor acute versus subacute fracture. Correlate with physical examination/point tenderness. No other acute displaced rib fracture seen within the limitations of this exam. The soft tissues are within normal limits. IMPRESSION: 1. No active cardiopulmonary disease. 2. There is mildly displaced fracture along the lateral aspect of the left clavicle, which is new since the prior study. The fracture margins appear sharp however, there is no significant surrounding soft tissue swelling. Findings favor acute versus subacute fracture. Correlate with physical examination/point tenderness. Electronically Signed   By: Ree Molt M.D.   On: 08/08/2024 08:25     .Laceration Repair  Date/Time: 08/08/2024 8:13 AM  Performed by: Yolande Lamar BROCKS, MD Authorized by: Yolande Lamar BROCKS, MD   Consent:    Consent obtained:  Verbal   Consent given by:  Healthcare  agent   Risks, benefits, and alternatives were discussed: yes   Anesthesia:    Anesthesia method:  Local infiltration   Local anesthetic:  Lidocaine  1% WITH epi Laceration details:    Location:  Face   Face location:  L eyebrow   Length (cm):  3 Treatment:    Area cleansed with:  Povidone-iodine    Amount of cleaning:  Standard   Irrigation solution:  Sterile saline   Irrigation method:  Pressure wash   Debridement:  None Skin repair:    Repair method:  Sutures   Suture size:  5-0   Suture material:  Fast-absorbing gut   Suture technique:  Simple interrupted   Number of sutures:  3 Approximation:    Approximation:  Close Repair type:    Repair type:  Simple Post-procedure details:    Dressing:  Open (no dressing) .Laceration Repair  Date/Time: 08/08/2024 8:13 AM  Performed by: Yolande Lamar BROCKS, MD Authorized by: Yolande Lamar BROCKS, MD   Consent:    Consent obtained:  Verbal   Consent given by:  Guardian Laceration details:    Location: Left knee.   Length (cm):  4 Skin repair:    Repair method:  Steri-Strips   Number of Steri-Strips:  5 Approximation:    Approximation:  Close Repair type:    Repair type:  Simple    Medications Ordered in the ED  lidocaine -EPINEPHrine  (XYLOCAINE  W/EPI) 2 %-1:200000 (PF) injection (  Given 08/08/24 0749)                                    Medical Decision Making Amount and/or Complexity of Data Reviewed Labs: ordered. Radiology: ordered.  Risk OTC drugs. Prescription drug management.   [name] is a 88 year old female with a history of Parkinson's and dementia who presents to the emergency department after a fall.  Found down with unknown downtime.  Initial Ddx:  TBI, C-spine injury, rib fracture, hip fracture  MDM/Course:  Patient presents emergency department after a fall.  Unable to get additional history from the patient because of her dementia.  Does have a laceration to her forehead as well as beneath her  eye.  Also has an abrasion of her knee.  CT of the head and C-spine without acute abnormality.  Chest x-ray did show left-sided distal clavicular fracture which was confirmed by dedicated x-ray.  As part of her workup did have a CT of the chest without contrast which did not show any other broken ribs.  She had hip x-rays that did not show  fracture.  Her eyebrow laceration was repaired using absorbable sutures.  The laceration underneath her eye does not appear to be amenable to primary closure at this point in time so we will allow this to heal on its own.  Did have Steri-Strips placed for the skin tear of her knee.  Given a sling we will have her follow-up with orthopedics as an outpatient.  Of note, family did inquire about UTI testing which showed many bacteria but no leuk esterase or white blood cells.  It is nitrite positive.  Her last urine culture showed pansensitive E. coli.  They report that she is already being treated with Macrobid at this point in time so we will hold off on additional treatment but will send off a urine culture.    This patient presents to the ED for concern of complaints listed in HPI, this involves an extensive number of treatment options, and is a complaint that carries with it a high risk of complications and morbidity. Disposition including potential need for admission considered.   Dispo: DC Home. Return precautions discussed including, but not limited to, those listed in the AVS. Allowed pt time to ask questions which were answered fully prior to dc.  Additional history obtained from daughter Records reviewed Outpatient Clinic Notes The following labs were independently interpreted: Chemistry and show no acute abnormality I independently reviewed the following imaging with scope of interpretation limited to determining acute life threatening conditions related to emergency care: Chest x-ray and agree with the radiologist interpretation with the following exceptions:  none I personally reviewed and interpreted cardiac monitoring: normal sinus rhythm  I personally reviewed and interpreted the pt's EKG: see above for interpretation  I have reviewed the patients home medications and made adjustments as needed Social Determinants of health:  Geriatric  Portions of this note were generated with Scientist, clinical (histocompatibility and immunogenetics). Dictation errors may occur despite best attempts at proofreading.     Final diagnoses:  Fall, initial encounter  Injury of head, initial encounter  Closed displaced fracture of acromial end of left clavicle, initial encounter  Laceration of eyebrow and forehead, left, initial encounter    ED Discharge Orders          Ordered    acetaminophen  (TYLENOL ) 160 MG/5ML solution  Every 6 hours PRN        08/08/24 1201    lidocaine  (LIDODERM ) 5 %  Every 24 hours        08/08/24 1201    neomycin-bacitracin -polymyxin [phone] OINT  Daily        08/08/24 1226               Yolande Lamar BROCKS, MD 08/08/24 1457

## 2024-08-08 NOTE — ED Notes (Signed)
Ortho tech called for shoulder immobilizer.  

## 2024-08-08 NOTE — ED Notes (Signed)
 Cancelled PTAR, arranged hospice transport to [facility]

## 2024-08-08 NOTE — ED Triage Notes (Signed)
 Pt BIB [facility] from [facility] d/t being found on the ground at approx. 0415 from falling OOB, unknown downtime till found. Does have lac above Lt eye brow & below the Lt eye, not on thinners. Hx of dementia, family on scene reports she is oriented at baseline. C-collar applied, no PIV, 132/78, 56 bpm, 16 resp, CBG 84.

## 2024-08-10 LAB — URINE CULTURE: Culture: >=100000 — AB

## 2024-08-11 ENCOUNTER — Telehealth (HOSPITAL_BASED_OUTPATIENT_CLINIC_OR_DEPARTMENT_OTHER): Payer: Self-pay | Admitting: *Deleted

## 2024-08-11 NOTE — Telephone Encounter (Signed)
 Post ED Visit - Positive Culture Follow-up: Successful Patient Follow-Up  Culture assessed and recommendations reviewed by:  []  Rankin Dee, Pharm.D. []  Venetia Gully, Pharm.D., BCPS AQ-ID []  Garrel Crews, Pharm.D., BCPS []  Almarie Lunger, [address].D., BCPS []  [place], [address].D., BCPS, AAHIVP []  Rosaline Bihari, Pharm.D., BCPS, AAHIVP []  Vernell Meier, PharmD, BCPS []  Latanya Hint, PharmD, BCPS []  Donald Medley, PharmD, BCPS [x] Dorn Buttner, PharmD  Positive urine culture  []  Patient discharged without antimicrobial prescription and treatment is now indicated [x]  Organism is resistant to prescribed ED discharge antimicrobial []  Patient with positive blood cultures  Changes discussed with ED provider: Jayson Pereyra New antibiotic prescription START: Bactrim DS tablets take 1 tablet twice daily for 3 days (Qty 6; Refills 0) HOLD macrobid while taking Bactrim  Pt currently on hospice at [facility]. Spoke to nurse there and faxed culture report with recommendations to facility.   Contacted facility, date 08/11/24, time 1230   [name] 08/11/2024, 12:28 PM

## 2024-08-11 NOTE — Progress Notes (Signed)
 ED Antimicrobial Stewardship Positive Culture Follow Up   [name] is an 88 y.o. female who presented to [facility] on @ADMITDT @ with a chief complaint of  Chief Complaint  Patient presents with   Fall   Lac above/below Lt eye    Recent Results (from the past 720 hours)  Urine Culture     Status: Abnormal   Collection Time: 08/08/24 11:07 AM   Specimen: In/Out Cath Urine  Result Value Ref Range Status   Specimen Description IN/OUT CATH URINE  Final   Special Requests   Final    NONE Performed at [facility], [address], [place], [place] [address]    Culture >=100,000 COLONIES/mL ESCHERICHIA COLI (A)  Final   Report Status 08/10/2024 FINAL  Final   Organism ID, Bacteria ESCHERICHIA COLI (A)  Final      Susceptibility   Escherichia coli - MIC*    AMPICILLIN  8 SENSITIVE Sensitive     CEFAZOLIN (URINE) Value in next row Sensitive      2 SENSITIVEThis is a modified FDA-approved test that has been validated and its performance characteristics determined by the reporting laboratory.  This laboratory is certified under the Clinical Laboratory Improvement Amendments CLIA as qualified to perform high complexity clinical laboratory testing.    CEFEPIME Value in next row Sensitive      2 SENSITIVEThis is a modified FDA-approved test that has been validated and its performance characteristics determined by the reporting laboratory.  This laboratory is certified under the Clinical Laboratory Improvement Amendments CLIA as qualified to perform high complexity clinical laboratory testing.    ERTAPENEM Value in next row Sensitive      2 SENSITIVEThis is a modified FDA-approved test that has been validated and its performance characteristics determined by the reporting laboratory.  This laboratory is certified under the Clinical Laboratory Improvement Amendments CLIA as qualified to perform high complexity clinical laboratory testing.    CEFTRIAXONE Value in next row Sensitive      2  SENSITIVEThis is a modified FDA-approved test that has been validated and its performance characteristics determined by the reporting laboratory.  This laboratory is certified under the Clinical Laboratory Improvement Amendments CLIA as qualified to perform high complexity clinical laboratory testing.    CIPROFLOXACIN Value in next row Sensitive      2 SENSITIVEThis is a modified FDA-approved test that has been validated and its performance characteristics determined by the reporting laboratory.  This laboratory is certified under the Clinical Laboratory Improvement Amendments CLIA as qualified to perform high complexity clinical laboratory testing.    GENTAMICIN Value in next row Sensitive      2 SENSITIVEThis is a modified FDA-approved test that has been validated and its performance characteristics determined by the reporting laboratory.  This laboratory is certified under the Clinical Laboratory Improvement Amendments CLIA as qualified to perform high complexity clinical laboratory testing.    NITROFURANTOIN Value in next row Resistant      2 SENSITIVEThis is a modified FDA-approved test that has been validated and its performance characteristics determined by the reporting laboratory.  This laboratory is certified under the Clinical Laboratory Improvement Amendments CLIA as qualified to perform high complexity clinical laboratory testing.    TRIMETH/SULFA Value in next row Sensitive      2 SENSITIVEThis is a modified FDA-approved test that has been validated and its performance characteristics determined by the reporting laboratory.  This laboratory is certified under the Clinical Laboratory Improvement Amendments CLIA as qualified  to perform high complexity clinical laboratory testing.    AMPICILLIN /SULBACTAM Value in next row Sensitive      2 SENSITIVEThis is a modified FDA-approved test that has been validated and its performance characteristics determined by the reporting laboratory.  This  laboratory is certified under the Clinical Laboratory Improvement Amendments CLIA as qualified to perform high complexity clinical laboratory testing.    PIP/TAZO Value in next row Sensitive ug/mL     <=4 SENSITIVEThis is a modified FDA-approved test that has been validated and its performance characteristics determined by the reporting laboratory.  This laboratory is certified under the Clinical Laboratory Improvement Amendments CLIA as qualified to perform high complexity clinical laboratory testing.    MEROPENEM Value in next row Sensitive      <=4 SENSITIVEThis is a modified FDA-approved test that has been validated and its performance characteristics determined by the reporting laboratory.  This laboratory is certified under the Clinical Laboratory Improvement Amendments CLIA as qualified to perform high complexity clinical laboratory testing.    * >=100,000 COLONIES/mL ESCHERICHIA COLI    [x]  Patient instructed to continue macrobid that patient was on at home prior to visit. On chart review it appears patient was on macrobid daily for prophylaxis. Culture above is resistant.  START: Bactrim DS tablets take 1 tablet twice daily for 3 days (Qty 6; Refills 0) HOLD macrobid while taking Bactrim  ED Provider: Elnor Jayson Dorn Job, PharmD, BCPS 08/11/2024 10:15 AM ED Clinical Pharmacist -  [phone]

## 2024-08-15 ENCOUNTER — Encounter: Payer: Self-pay | Admitting: Physician Assistant

## 2024-11-11 DEATH — deceased
